# Patient Record
Sex: Female | Born: 1940 | Race: Black or African American | Hispanic: No | Smoking: Current every day smoker
Health system: Southern US, Community
[De-identification: ages and names within clinical notes are randomized; demographics above are authoritative.]

## PROBLEM LIST (undated history)

## (undated) DIAGNOSIS — M1990 Unspecified osteoarthritis, unspecified site: Secondary | ICD-10-CM

## (undated) DIAGNOSIS — R519 Headache, unspecified: Secondary | ICD-10-CM

## (undated) DIAGNOSIS — H269 Unspecified cataract: Secondary | ICD-10-CM

## (undated) DIAGNOSIS — Z860100 Personal history of colon polyps, unspecified: Secondary | ICD-10-CM

## (undated) DIAGNOSIS — F329 Major depressive disorder, single episode, unspecified: Secondary | ICD-10-CM

## (undated) DIAGNOSIS — E039 Hypothyroidism, unspecified: Secondary | ICD-10-CM

## (undated) DIAGNOSIS — Z973 Presence of spectacles and contact lenses: Secondary | ICD-10-CM

## (undated) DIAGNOSIS — Z95810 Presence of automatic (implantable) cardiac defibrillator: Secondary | ICD-10-CM

## (undated) DIAGNOSIS — E119 Type 2 diabetes mellitus without complications: Secondary | ICD-10-CM

## (undated) DIAGNOSIS — Z972 Presence of dental prosthetic device (complete) (partial): Secondary | ICD-10-CM

## (undated) DIAGNOSIS — K219 Gastro-esophageal reflux disease without esophagitis: Secondary | ICD-10-CM

## (undated) DIAGNOSIS — I5041 Acute combined systolic (congestive) and diastolic (congestive) heart failure: Secondary | ICD-10-CM

## (undated) DIAGNOSIS — T7840XA Allergy, unspecified, initial encounter: Secondary | ICD-10-CM

## (undated) DIAGNOSIS — E079 Disorder of thyroid, unspecified: Secondary | ICD-10-CM

## (undated) DIAGNOSIS — I10 Essential (primary) hypertension: Secondary | ICD-10-CM

## (undated) DIAGNOSIS — F32A Depression, unspecified: Secondary | ICD-10-CM

## (undated) DIAGNOSIS — C801 Malignant (primary) neoplasm, unspecified: Secondary | ICD-10-CM

## (undated) DIAGNOSIS — K5090 Crohn's disease, unspecified, without complications: Secondary | ICD-10-CM

## (undated) DIAGNOSIS — R569 Unspecified convulsions: Secondary | ICD-10-CM

## (undated) DIAGNOSIS — K589 Irritable bowel syndrome without diarrhea: Secondary | ICD-10-CM

## (undated) HISTORY — DX: Allergy, unspecified, initial encounter: T78.40XA

## (undated) HISTORY — PX: CATARACT EXTRACTION W/ INTRAOCULAR LENS IMPLANT: SHX1309

## (undated) HISTORY — DX: Disorder of thyroid, unspecified: E07.9

## (undated) HISTORY — DX: Type 2 diabetes mellitus without complications: E11.9

## (undated) HISTORY — PX: BREAST SURGERY: SHX581

## (undated) HISTORY — DX: Unspecified convulsions: R56.9

## (undated) HISTORY — PX: MASTECTOMY: SHX3

## (undated) HISTORY — PX: ABDOMINAL HYSTERECTOMY: SHX81

## (undated) HISTORY — PX: TONSILLECTOMY: SUR1361

## (undated) HISTORY — DX: Depression, unspecified: F32.A

## (undated) HISTORY — DX: Major depressive disorder, single episode, unspecified: F32.9

## (undated) HISTORY — DX: Essential (primary) hypertension: I10

---

## 2007-02-22 ENCOUNTER — Encounter: Admission: RE | Admit: 2007-02-22 | Discharge: 2007-02-22 | Payer: Self-pay | Admitting: Obstetrics and Gynecology

## 2009-05-28 ENCOUNTER — Emergency Department (HOSPITAL_BASED_OUTPATIENT_CLINIC_OR_DEPARTMENT_OTHER): Admission: EM | Admit: 2009-05-28 | Discharge: 2009-05-28 | Payer: Self-pay | Admitting: Emergency Medicine

## 2011-05-26 ENCOUNTER — Other Ambulatory Visit: Payer: Self-pay | Admitting: Obstetrics and Gynecology

## 2011-05-26 DIAGNOSIS — Z1231 Encounter for screening mammogram for malignant neoplasm of breast: Secondary | ICD-10-CM

## 2011-05-31 ENCOUNTER — Ambulatory Visit
Admission: RE | Admit: 2011-05-31 | Discharge: 2011-05-31 | Disposition: A | Discharge status: Home / Self Care | Payer: Medicare Other | Source: Ambulatory Visit | Attending: Obstetrics and Gynecology | Admitting: Obstetrics and Gynecology

## 2011-05-31 DIAGNOSIS — Z1231 Encounter for screening mammogram for malignant neoplasm of breast: Secondary | ICD-10-CM

## 2012-05-07 ENCOUNTER — Other Ambulatory Visit: Payer: Self-pay | Admitting: Obstetrics and Gynecology

## 2012-05-07 ENCOUNTER — Other Ambulatory Visit: Payer: Self-pay | Admitting: Family Medicine

## 2012-05-07 DIAGNOSIS — Z1231 Encounter for screening mammogram for malignant neoplasm of breast: Secondary | ICD-10-CM

## 2012-05-31 ENCOUNTER — Ambulatory Visit
Admission: RE | Admit: 2012-05-31 | Discharge: 2012-05-31 | Disposition: A | Discharge status: Home / Self Care | Payer: Medicare Other | Source: Ambulatory Visit | Attending: Family Medicine | Admitting: Family Medicine

## 2012-05-31 DIAGNOSIS — Z1231 Encounter for screening mammogram for malignant neoplasm of breast: Secondary | ICD-10-CM

## 2012-06-20 ENCOUNTER — Emergency Department: Payer: Self-pay | Admitting: Emergency Medicine

## 2012-06-20 LAB — CBC WITH DIFFERENTIAL/PLATELET
Basophil #: 0.1 10*3/uL (ref 0.0–0.1)
Basophil %: 1.1 %
Eosinophil #: 0 10*3/uL (ref 0.0–0.7)
Eosinophil %: 0.7 %
HCT: 40.1 % (ref 35.0–47.0)
HGB: 13.9 g/dL (ref 12.0–16.0)
Lymphocyte #: 2.1 10*3/uL (ref 1.0–3.6)
Lymphocyte %: 33.6 %
MCH: 32.4 pg (ref 26.0–34.0)
MCHC: 34.7 g/dL (ref 32.0–36.0)
MCV: 93 fL (ref 80–100)
Monocyte #: 0.6 x10 3/mm (ref 0.2–0.9)
Monocyte %: 9.1 %
Neutrophil #: 3.4 10*3/uL (ref 1.4–6.5)
Neutrophil %: 55.5 %
Platelet: 308 10*3/uL (ref 150–440)
RBC: 4.3 10*6/uL (ref 3.80–5.20)
RDW: 14.3 % (ref 11.5–14.5)
WBC: 6.1 10*3/uL (ref 3.6–11.0)

## 2012-06-20 LAB — URINALYSIS, COMPLETE
Bacteria: NONE SEEN
Bilirubin,UR: NEGATIVE
Blood: NEGATIVE
Glucose,UR: >=500 mg/dL (ref 0–75)
Ketone: NEGATIVE
Leukocyte Esterase: NEGATIVE
Nitrite: NEGATIVE
Ph: 5 (ref 4.5–8.0)
Protein: NEGATIVE
RBC,UR: 3 /HPF (ref 0–5)
Specific Gravity: 1.012 (ref 1.003–1.030)
Squamous Epithelial: NONE SEEN
WBC UR: 1 /HPF (ref 0–5)

## 2012-06-20 LAB — BASIC METABOLIC PANEL
Anion Gap: 7 (ref 7–16)
BUN: 12 mg/dL (ref 7–18)
Calcium, Total: 9 mg/dL (ref 8.5–10.1)
Chloride: 109 mmol/L — ABNORMAL HIGH (ref 98–107)
Co2: 25 mmol/L (ref 21–32)
Creatinine: 0.69 mg/dL (ref 0.60–1.30)
EGFR (African American): >60
EGFR (Non-African Amer.): >60
Glucose: 173 mg/dL — ABNORMAL HIGH (ref 65–99)
Osmolality: 285 (ref 275–301)
Potassium: 3.6 mmol/L (ref 3.5–5.1)
Sodium: 141 mmol/L (ref 136–145)

## 2013-05-26 ENCOUNTER — Other Ambulatory Visit: Payer: Self-pay

## 2013-05-26 DIAGNOSIS — Z1231 Encounter for screening mammogram for malignant neoplasm of breast: Secondary | ICD-10-CM

## 2013-06-17 ENCOUNTER — Ambulatory Visit
Admission: RE | Admit: 2013-06-17 | Discharge: 2013-06-17 | Disposition: A | Discharge status: Home / Self Care | Payer: Medicare Other | Source: Ambulatory Visit

## 2013-06-17 DIAGNOSIS — Z1231 Encounter for screening mammogram for malignant neoplasm of breast: Secondary | ICD-10-CM

## 2014-06-11 ENCOUNTER — Other Ambulatory Visit: Payer: Self-pay

## 2014-06-11 DIAGNOSIS — Z1231 Encounter for screening mammogram for malignant neoplasm of breast: Secondary | ICD-10-CM

## 2014-06-18 ENCOUNTER — Ambulatory Visit
Admission: RE | Admit: 2014-06-18 | Discharge: 2014-06-18 | Disposition: A | Discharge status: Home / Self Care | Payer: Commercial Managed Care - HMO | Source: Ambulatory Visit

## 2014-06-18 DIAGNOSIS — Z1231 Encounter for screening mammogram for malignant neoplasm of breast: Secondary | ICD-10-CM

## 2014-07-18 ENCOUNTER — Ambulatory Visit (INDEPENDENT_AMBULATORY_CARE_PROVIDER_SITE_OTHER): Payer: Medicare HMO

## 2014-07-18 ENCOUNTER — Ambulatory Visit (INDEPENDENT_AMBULATORY_CARE_PROVIDER_SITE_OTHER): Payer: Medicare HMO | Admitting: Emergency Medicine

## 2014-07-18 VITALS — BP 140/90 | HR 62 | Temp 97.4°F | Resp 16 | Ht 62.0 in | Wt 145.0 lb

## 2014-07-18 DIAGNOSIS — S92911S Unspecified fracture of right toe(s), sequela: Secondary | ICD-10-CM

## 2014-07-18 DIAGNOSIS — S8290XS Unspecified fracture of unspecified lower leg, sequela: Secondary | ICD-10-CM

## 2014-07-18 NOTE — Patient Instructions (Signed)

## 2014-07-18 NOTE — Progress Notes (Signed)
   Subjective:    Patient ID: Colleen Lowery, female    DOB: 1941/07/20, 73 y.o.   MRN: [id number]  This chart was scribed for Arlyss Queen, MD by Erling Conte, Medical Scribe. This patient was seen in Room 4 and the patient's care was started at 10:21 AM.  Chief Complaint  Patient presents with  . Toe Injury    second toe right foot      HPI HPI Comments: Colleen Lowery is a 73 y.o. female who presents to the [facility] complaining of moderate, constant pain in her second toe of her right foot. She states she injured her toe about 2 weeks ago but at the time she just believed that she "stubbed" it.  She admits that she continued to have pain and swelling to the area. She went to go see her PCP and he sent her to have imaging at [facility]. She was told she has an articulate fracture in the toe. She states that the pain is exacerbated by ambulating and applied pressure. She admits that taping up the toe has provided relief for the pain when she applies pressure. She denies any weakness, numbness, or color change to the area.    Review of Systems  Musculoskeletal: Positive for arthralgias (right second toe pain), gait problem and joint swelling (right second toe).  Skin: Negative for color change.  Neurological: Negative for weakness and numbness.       Objective:   Physical Exam CONSTITUTIONAL: Well developed/well nourished HEAD: Normocephalic/atraumatic EYES: EOMI/PERRL ENMT: Mucous membranes moist NECK: supple no meningeal signs SPINE:entire spine nontender CV: S1/S2 noted, no murmurs/rubs/gallops noted LUNGS: Lungs are clear to auscultation bilaterally, no apparent distress ABDOMEN: soft, nontender, no rebound or guarding GU:no cva tenderness NEURO: Pt is awake/alert, moves all extremitiesx4 EXTREMITIES: pulses normal, full ROM. Swelling of the right 2nd toe with minimal angulation.  SKIN: warm, color normal PSYCH: no abnormalities of mood  noted  [facility] reading (PRIMARY) by  Dr. Everlene Farrier there is a fracture of the proximal phalanx second toe which is intra-articular with mild medial deviation.      Assessment & Plan:  Patient here with intra-articular fracture at the PIP joint second toe. Buddy taping performed she was given a postop shoe rex-ray 2 weeks .

## 2014-08-02 ENCOUNTER — Ambulatory Visit (INDEPENDENT_AMBULATORY_CARE_PROVIDER_SITE_OTHER): Payer: Medicare HMO

## 2014-08-02 ENCOUNTER — Ambulatory Visit (INDEPENDENT_AMBULATORY_CARE_PROVIDER_SITE_OTHER): Payer: Commercial Managed Care - HMO | Admitting: Emergency Medicine

## 2014-08-02 VITALS — BP 182/96 | HR 55 | Temp 97.7°F | Resp 16 | Ht 62.5 in | Wt 149.5 lb

## 2014-08-02 DIAGNOSIS — M79609 Pain in unspecified limb: Secondary | ICD-10-CM

## 2014-08-02 DIAGNOSIS — M79674 Pain in right toe(s): Secondary | ICD-10-CM

## 2014-08-02 DIAGNOSIS — S8290XS Unspecified fracture of unspecified lower leg, sequela: Secondary | ICD-10-CM

## 2014-08-02 DIAGNOSIS — E119 Type 2 diabetes mellitus without complications: Secondary | ICD-10-CM | POA: Insufficient documentation

## 2014-08-02 DIAGNOSIS — I10 Essential (primary) hypertension: Secondary | ICD-10-CM | POA: Insufficient documentation

## 2014-08-02 DIAGNOSIS — S92911S Unspecified fracture of right toe(s), sequela: Secondary | ICD-10-CM

## 2014-08-02 NOTE — Progress Notes (Signed)
Subjective:    Patient ID: Colleen Lowery, female    DOB: 05-Jul-1941, 73 y.o.   MRN: [id number] This chart was scribed for [place]. Everlene Farrier, MD by Steva Colder, ED Scribe. The patient was seen in room 8 at 4:31 PM.   Chief Complaint  Patient presents with  . Follow-up    X-ray foot    HPI Colleen Lowery is a 73 y.o. female with a medical hx of DM and HTN who presents today complaining of a F/U on a X-ray of her right foot. She states that she was here for a fracture to her right toes. She states that she has been buddy taping it since the injury. She states that she thinks that she injured her foot on her bed while walking to the bathroom at night. She states that she is taking her blood pressure medications.    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 She states that no one aids in her reacting chemicals. She states that she ran into a brick wall at [place], after she cleaned mold in her car and she didn't ventilate the car and she passed out. She states that she hasn't felt right since she gave blood 2 years ago. She voices concerns for getting a bag of fluids to aid in the balance. she states that she is not able to get a specialist to help her. She denies any other associated symptoms. 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 She states that her Neurologist is Dr. Baltazar Najjar at [facility] in [facility]. 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      There are no active problems to display for this patient.  Past Medical History  Diagnosis Date  . Allergy   . Depression   . Diabetes mellitus without complication   . Hypertension   . Seizures   . Thyroid disease    Past Surgical History  Procedure Laterality Date  . Abdominal hysterectomy     Allergies  Allergen Reactions  . Dust Mite Extract Swelling  . Molds & Smuts Swelling   Prior to Admission medications   Medication Sig Start  Date End Date Taking? Authorizing Provider  levETIRAcetam (KEPPRA) 250 MG tablet Take 250 mg by mouth 2 (two) times daily.   Yes Historical Provider, MD  Liothyronine Sodium (CYTOMEL PO) Take 12.5 mg by mouth daily.   Yes Historical Provider, MD  losartan (COZAAR) 100 MG tablet Take 100 mg by mouth daily.   Yes Historical Provider, MD  metFORMIN (GLUCOPHAGE) 1000 MG tablet Take 1,000 mg by mouth 2 (two) times daily with a meal.   Yes Historical Provider, MD  NON FORMULARY Sublingual Antigen-inhalents-1 pump under tongue three times daily   Yes Historical Provider, MD  Nutritional Supplements (DHEA PO) Take 1.5 mg by mouth daily.   Yes Historical Provider, MD  Progesterone Micronized (PROGESTERONE PO) Take 12.5 mg by mouth daily.   Yes Historical Provider, MD      Review of Systems     Objective:   Physical Exam  Nursing note and vitals reviewed. Constitutional: She is oriented to person, place, and time. She appears well-developed and well-nourished. No distress.  HENT:  Head: Normocephalic and atraumatic.  Eyes: EOM are normal.  Neck: Neck supple.  Cardiovascular: Normal rate.   Pulmonary/Chest: Effort normal. No respiratory distress.  Musculoskeletal: Normal range of motion.  Neurological: She is alert and oriented to person, place, and time.  Skin: Skin is warm and dry.  Psychiatric: She has a normal mood and affect. Her behavior is normal.   Mild swelling of the right second toe. There is a normal appearance of alignment.  [facility] reading (PRIMARY) by  Dr Everlene Farrier there are healing fractures of the middle and proximal phalanx of the right second toe      BP 198/88  Pulse 55  Temp(Src) 97.7 F (36.5 C) (Oral)  Resp 16  Ht 5' 2.5" (1.588 m)  Wt 149 lb 8 oz (67.813 kg)  BMI 26.89 kg/m2  SpO2 100%   Assessment & Plan:  I personally performed the services described in this documentation, which was scribed in my presence. The recorded information has been reviewed and is  accurate.  Pt advised to F/u with her PCP regarding her elevated blood pressure. She will keep her toes buddy-taped for the next 3-4 weeks.

## 2014-08-03 ENCOUNTER — Telehealth: Payer: Self-pay

## 2014-08-03 NOTE — Telephone Encounter (Signed)
Dr. Everlene Farrier wanted me to call pt and make sure she follows up in 3-4 weeks for a repeat x-ray. LMOM of this

## 2014-09-01 ENCOUNTER — Ambulatory Visit (INDEPENDENT_AMBULATORY_CARE_PROVIDER_SITE_OTHER): Payer: Medicare HMO | Admitting: Internal Medicine

## 2014-09-01 ENCOUNTER — Ambulatory Visit (INDEPENDENT_AMBULATORY_CARE_PROVIDER_SITE_OTHER): Payer: Medicare HMO

## 2014-09-01 VITALS — BP 192/60 | HR 83 | Temp 98.2°F | Resp 18 | Ht 62.0 in | Wt 151.0 lb

## 2014-09-01 DIAGNOSIS — S92911S Unspecified fracture of right toe(s), sequela: Secondary | ICD-10-CM

## 2014-09-01 NOTE — Progress Notes (Signed)
   Subjective:    Patient ID: Colleen Lowery, female    DOB: 09-19-1941, 73 y.o.   MRN: [id number]  HPI she stubbed the toes of her right foot late in August and subsequently discovered that they were broken with nondisplaced fractures of the PIP 2 and 3 She has been treated with buddy taping and a stiff shoe and has done well She currently is pain free with ambulation she says Her swelling has resolved  Current medical problems include diabetes and hypertension   Review of Systems Noncontributory    Objective:   Physical Exam BP 192/60  Pulse 83  Temp(Src) 98.2 F (36.8 C) (Oral)  Resp 18  Ht 5\' 2"  (1.575 m)  Wt 151 lb (68.493 kg)  BMI 27.61 kg/m2  SpO2 100% No acute distress there is no swelling of the right foot She is nontender with palpation over the PIP joints or the MTP joints of the right second and third toes and has a fairly good range of motion although the right second PIP is stiff Gait is non-antalgic Sensation intact/vascular intact/onychomycosis present  [facility] reading (PRIMARY) by  Dr. Laney Pastor  fracture appearance is the same in both locations without evidence of new bone.       Assessment & Plan:  Fractures PIP joints 2 and 3 on the right foot  Stable by exam- to continue to progress activities as tolerated using buddy taping and postop shoe only as needed to control pain//if not pain-free re-x-ray in late November  She is referred back to her primary care provider for further control of her hypertension

## 2014-11-03 ENCOUNTER — Ambulatory Visit (INDEPENDENT_AMBULATORY_CARE_PROVIDER_SITE_OTHER): Payer: Medicare HMO | Admitting: Family Medicine

## 2014-11-03 ENCOUNTER — Ambulatory Visit (INDEPENDENT_AMBULATORY_CARE_PROVIDER_SITE_OTHER): Payer: Medicare HMO

## 2014-11-03 VITALS — BP 162/100 | HR 88 | Temp 98.1°F | Resp 16 | Ht 62.25 in | Wt 153.8 lb

## 2014-11-03 DIAGNOSIS — S92591S Other fracture of right lesser toe(s), sequela: Secondary | ICD-10-CM

## 2014-11-03 DIAGNOSIS — S92501S Displaced unspecified fracture of right lesser toe(s), sequela: Secondary | ICD-10-CM

## 2014-11-03 NOTE — Progress Notes (Signed)
[facility] [address], [place] [address] [phone]  Date:  11/03/2014   Name:  Colleen Lowery   DOB:  03/18/1941   MRN:  [id number]  PCP:  No PCP Per Patient    Chief Complaint: Follow-up and Toe Injury   History of Present Illness:  Colleen Lowery is a 73 y.o. very pleasant female patient who presents with the following:  Here today to follow-up a right toe fracture that occurred in August-  first seen for this 9/5: IMPRESSION: 1. Minimally displaced subacute fracture involving the distal epiphysis of the proximal phalanx of the second digit with extension to the PIP joint. 2. Minimally displaced subacute fracture involving the mid diaphysis of the middle phalanx of the second digit without definite intra-articular extension.  Most recent x-ray from 2 months ago showed healing She would like to have it rechecked as it continues to bother her some.  She feels "something down here" under the toe.  Her pain is now more under the foot, it can feel sensitive when she walks   Patient Active Problem List   Diagnosis Date Noted  . Diabetes 08/02/2014  . Unspecified essential hypertension 08/02/2014    Past Medical History  Diagnosis Date  . Allergy   . Depression   . Diabetes mellitus without complication   . Hypertension   . Seizures   . Thyroid disease     Past Surgical History  Procedure Laterality Date  . Abdominal hysterectomy      History  Substance Use Topics  . Smoking status: Current Every Day Smoker  . Smokeless tobacco: Never Used  . Alcohol Use: No    Family History  Problem Relation Age of Onset  . Diabetes Mother   . Stroke Mother   . Diabetes Brother   . Diabetes Maternal Grandmother   . Diabetes Paternal Grandfather   . Hypertension Brother     Allergies  Allergen Reactions  . Dust Mite Extract Swelling  . Molds & Smuts Swelling    Medication list has been reviewed and updated.  Current Outpatient  Prescriptions on File Prior to Visit  Medication Sig Dispense Refill  . levETIRAcetam (KEPPRA) 250 MG tablet Take 250 mg by mouth 2 (two) times daily.    . Liothyronine Sodium (CYTOMEL PO) Take 12.5 mg by mouth daily.    [name] losartan (COZAAR) 100 MG tablet Take 100 mg by mouth daily.    . metFORMIN (GLUCOPHAGE) 1000 MG tablet Take 1,000 mg by mouth 2 (two) times daily with a meal.    . NON FORMULARY Sublingual Antigen-inhalents-1 pump under tongue three times daily    . Nutritional Supplements (DHEA PO) Take 1.5 mg by mouth daily.    . Progesterone Micronized (PROGESTERONE PO) Take 12.5 mg by mouth daily.     No current facility-administered medications on file prior to visit.    Review of Systems:  As per HPI- otherwise negative.  BP Readings from Last 3 Encounters:  11/03/14 162/100  09/01/14 192/60  08/02/14 182/96    Physical Examination: Filed Vitals:   11/03/14 1331  BP: 162/100  Pulse: 88  Temp: 98.1 F (36.7 C)  Resp: 16   Filed Vitals:   11/03/14 1331  Height: 5' 2.25" (1.581 m)  Weight: 153 lb 12.8 oz (69.763 kg)   Body mass index is 27.91 kg/(m^2). Ideal Body Weight: Weight in (lb) to have BMI = 25: 137.5   GEN: WDWN, NAD, Non-toxic, Alert &  Oriented x 3 HEENT: Atraumatic, Normocephalic.  Ears and Nose: No external deformity. EXTR: No clubbing/cyanosis/edema NEURO: Normal gait.  PSYCH: Normally interactive. Conversant. Not depressed or anxious appearing.  Calm demeanor.  Right foot:mild tenderness under the distal 2nd MT head.  She has fat atrophy under the foot; this is likely why she notes the tenderness The toe is non- swollen, no bruise, normal flexion.  Appears well- healed  [facility] reading (PRIMARY) by  Dr. Lorelei Pont. Right foot: healed fracture of the 2nd toe  RIGHT FOOT COMPLETE - 3+ VIEW  COMPARISON: 09/01/2014  FINDINGS: Three views of the right foot submitted. There is healed fracture distal aspect of proximal phalanx second toe. No new  fracture or subluxation. Plantar spur of calcaneus.  IMPRESSION: Healed fracture of distal aspect proximal phalanx second toe. No new fracture or subluxation. Plantar spurring of calcaneus.  Assessment and Plan: Fracture of second toe, right, closed, sequela - Plan: DG Foot Complete Right  Counseled her that her toe appears to be completely healed.  She can return to her regular activities.  Suggested that she try some padded insoles as I suspect her pain now is due to lack of padding under her foot from fat atrophy  Signed Lamar Blinks, MD

## 2014-11-03 NOTE — Patient Instructions (Signed)
Your fractures appear to have healed well, let us know if you have any other concerns

## 2015-07-02 ENCOUNTER — Other Ambulatory Visit: Payer: Self-pay

## 2015-07-02 DIAGNOSIS — Z1231 Encounter for screening mammogram for malignant neoplasm of breast: Secondary | ICD-10-CM

## 2015-07-06 ENCOUNTER — Ambulatory Visit
Admission: RE | Admit: 2015-07-06 | Discharge: 2015-07-06 | Disposition: A | Discharge status: Home / Self Care | Payer: Commercial Managed Care - HMO | Source: Ambulatory Visit

## 2015-07-06 DIAGNOSIS — Z1231 Encounter for screening mammogram for malignant neoplasm of breast: Secondary | ICD-10-CM

## 2015-07-23 ENCOUNTER — Emergency Department (HOSPITAL_COMMUNITY)
Admission: EM | Admit: 2015-07-23 | Discharge: 2015-07-23 | Disposition: A | Discharge status: Home / Self Care | Payer: Medicare HMO | Source: Home / Self Care | Attending: Family Medicine | Admitting: Family Medicine

## 2015-07-23 ENCOUNTER — Encounter (HOSPITAL_COMMUNITY): Payer: Self-pay | Admitting: Emergency Medicine

## 2015-07-23 DIAGNOSIS — R21 Rash and other nonspecific skin eruption: Secondary | ICD-10-CM | POA: Diagnosis not present

## 2015-07-23 DIAGNOSIS — L739 Follicular disorder, unspecified: Secondary | ICD-10-CM

## 2015-07-23 DIAGNOSIS — I10 Essential (primary) hypertension: Secondary | ICD-10-CM | POA: Diagnosis not present

## 2015-07-23 MED ORDER — CEPHALEXIN 500 MG PO CAPS: 500.0000 mg | ORAL_CAPSULE | Freq: Three times a day (TID) | ORAL | Status: DC

## 2015-07-23 MED ORDER — FLUCONAZOLE 150 MG PO TABS
150.0000 mg | ORAL_TABLET | Freq: Every day | ORAL | Status: DC
Start: 2015-07-23 — End: 2016-02-12

## 2015-07-23 NOTE — Discharge Instructions (Signed)
The cause of your rashes likely from a mild bacterial overgrowth infection called folliculitis. Please start the Keflex. Please consider using the Diflucan for additional yeast protection. Please also start using a mild over-the-counter steroid cream such as hydrocortisone.

## 2015-07-23 NOTE — ED Notes (Signed)
Pt c/o rash on abd (below breast) and on back onset 2-3 weeks Also c/o left eye itchiness x2-3 weeks Alert and oriented x4... No acute distress.

## 2015-07-23 NOTE — ED Provider Notes (Signed)
CSN: [id number]     Arrival date & time 07/23/15  1417 History   First MD Initiated Contact with Patient 07/23/15 1453     Chief Complaint  Patient presents with  . Rash  . Eye Problem   (Consider location/radiation/quality/duration/timing/severity/associated sxs/prior Treatment) HPI  Rash: started 2-3 wks ago. Comes and goes. Itchy. Back, chest. Has not tried anything for the rash. Denies fevers, nausea, vomiting, chest pain, display weight loss, joint effusions.   Eyelid. R itchy. Comes and goes. No vision change.      Past Medical History  Diagnosis Date  . Allergy   . Depression   . Diabetes mellitus without complication   . Hypertension   . Seizures   . Thyroid disease    Past Surgical History  Procedure Laterality Date  . Abdominal hysterectomy     Family History  Problem Relation Age of Onset  . Diabetes Mother   . Stroke Mother   . Diabetes Brother   . Diabetes Maternal Grandmother   . Diabetes Paternal Grandfather   . Hypertension Brother    Social History  Substance Use Topics  . Smoking status: Current Every Day Smoker  . Smokeless tobacco: Never Used  . Alcohol Use: No   OB History    No data available     Review of Systems Per HPI with all other pertinent systems negative.   Allergies  Dust mite extract and Molds & smuts  Home Medications   Prior to Admission medications   Medication Sig Start Date End Date Taking? Authorizing Provider  levETIRAcetam (KEPPRA) 250 MG tablet Take 250 mg by mouth 2 (two) times daily.   Yes Historical Provider, MD  Liothyronine Sodium (CYTOMEL PO) Take 12.5 mg by mouth daily.   Yes Historical Provider, MD  losartan (COZAAR) 100 MG tablet Take 100 mg by mouth daily.   Yes Historical Provider, MD  metFORMIN (GLUCOPHAGE) 1000 MG tablet Take 1,000 mg by mouth 2 (two) times daily with a meal.   Yes Historical Provider, MD  NON FORMULARY Sublingual Antigen-inhalents-1 pump under tongue three times daily   Yes  Historical Provider, MD  Nutritional Supplements (DHEA PO) Take 1.5 mg by mouth daily.   Yes Historical Provider, MD  Progesterone Micronized (PROGESTERONE PO) Take 12.5 mg by mouth daily.   Yes Historical Provider, MD  cephALEXin (KEFLEX) 500 MG capsule Take 1 capsule (500 mg total) by mouth 3 (three) times daily. 07/23/15   Waldemar Dickens, MD  fluconazole (DIFLUCAN) 150 MG tablet Take 1 tablet (150 mg total) by mouth daily. Repeat dose in 3 days 07/23/15   Waldemar Dickens, MD   Meds Ordered and Administered this Visit  Medications - No data to display  BP 183/104 mmHg  Pulse 101  Temp(Src) 98.3 F (36.8 C) (Oral)  Resp 16  SpO2 98% No data found.   Physical Exam /ucpe Physical Exam  Constitutional: oriented to person, place, and time. appears well-developed and well-nourished. No distress.  HENT:  Head: Normocephalic and atraumatic.  Eyes: EOMI. PERRL.  Neck: Normal range of motion.  Cardiovascular: RRR, no m/r/g, 2+ distal pulses,  Pulmonary/Chest: Effort normal and breath sounds normal. No respiratory distress.  Abdominal: Soft. Bowel sounds are normal. NonTTP, no distension.  Musculoskeletal: Normal range of motion. Non ttp, no effusion.  Neurological: alert and oriented to person, place, and time.  Skin: Mild follicular rash throughout upper back and upper chest. Several with pustular minerals.  Psychiatric: normal mood and affect. behavior is  normal. Judgment and thought content normal.   ED Course  Procedures (including critical care time)  Labs Review Labs Reviewed - No data to display  Imaging Review No results found.   Visual Acuity Review  Right Eye Distance:   Left Eye Distance:   Bilateral Distance:    Right Eye Near:   Left Eye Near:    Bilateral Near:         MDM   1. Folliculitis   2. Rash   3. Essential hypertension    Elevation in blood pressure noted. Patient does not take her blood pressure medication this morning.  Keflex for  folliculitis. Diflucan if develops yeast infection. Patient also to use hydrocortisone  Szr: no szr since 2014  Waldemar Dickens, MD 07/23/15 1525

## 2016-02-07 ENCOUNTER — Emergency Department (HOSPITAL_COMMUNITY): Payer: Medicare HMO

## 2016-02-07 ENCOUNTER — Encounter (HOSPITAL_COMMUNITY): Payer: Self-pay | Admitting: *Deleted

## 2016-02-07 ENCOUNTER — Inpatient Hospital Stay (HOSPITAL_COMMUNITY): Payer: Medicare HMO

## 2016-02-07 ENCOUNTER — Inpatient Hospital Stay (HOSPITAL_COMMUNITY)
Admission: EM | Admit: 2016-02-07 | Discharge: 2016-02-12 | DRG: 224 | Disposition: A | Discharge status: Home / Self Care | Payer: Medicare HMO | Attending: Interventional Cardiology | Admitting: Interventional Cardiology

## 2016-02-07 DIAGNOSIS — I2582 Chronic total occlusion of coronary artery: Secondary | ICD-10-CM | POA: Diagnosis present

## 2016-02-07 DIAGNOSIS — R0600 Dyspnea, unspecified: Secondary | ICD-10-CM | POA: Insufficient documentation

## 2016-02-07 DIAGNOSIS — I272 Other secondary pulmonary hypertension: Secondary | ICD-10-CM | POA: Diagnosis not present

## 2016-02-07 DIAGNOSIS — I2510 Atherosclerotic heart disease of native coronary artery without angina pectoris: Secondary | ICD-10-CM | POA: Diagnosis present

## 2016-02-07 DIAGNOSIS — F17200 Nicotine dependence, unspecified, uncomplicated: Secondary | ICD-10-CM | POA: Diagnosis present

## 2016-02-07 DIAGNOSIS — R079 Chest pain, unspecified: Secondary | ICD-10-CM | POA: Diagnosis not present

## 2016-02-07 DIAGNOSIS — I110 Hypertensive heart disease with heart failure: Secondary | ICD-10-CM | POA: Diagnosis present

## 2016-02-07 DIAGNOSIS — I472 Ventricular tachycardia, unspecified: Secondary | ICD-10-CM

## 2016-02-07 DIAGNOSIS — I255 Ischemic cardiomyopathy: Secondary | ICD-10-CM | POA: Diagnosis present

## 2016-02-07 DIAGNOSIS — I5043 Acute on chronic combined systolic (congestive) and diastolic (congestive) heart failure: Secondary | ICD-10-CM | POA: Diagnosis not present

## 2016-02-07 DIAGNOSIS — I471 Supraventricular tachycardia, unspecified: Secondary | ICD-10-CM

## 2016-02-07 DIAGNOSIS — R0602 Shortness of breath: Secondary | ICD-10-CM

## 2016-02-07 DIAGNOSIS — F329 Major depressive disorder, single episode, unspecified: Secondary | ICD-10-CM | POA: Diagnosis present

## 2016-02-07 DIAGNOSIS — I5031 Acute diastolic (congestive) heart failure: Secondary | ICD-10-CM

## 2016-02-07 DIAGNOSIS — Z79899 Other long term (current) drug therapy: Secondary | ICD-10-CM | POA: Diagnosis not present

## 2016-02-07 DIAGNOSIS — Z91041 Radiographic dye allergy status: Secondary | ICD-10-CM | POA: Diagnosis not present

## 2016-02-07 DIAGNOSIS — E119 Type 2 diabetes mellitus without complications: Secondary | ICD-10-CM | POA: Diagnosis present

## 2016-02-07 DIAGNOSIS — Z95818 Presence of other cardiac implants and grafts: Secondary | ICD-10-CM

## 2016-02-07 DIAGNOSIS — I10 Essential (primary) hypertension: Secondary | ICD-10-CM | POA: Diagnosis present

## 2016-02-07 DIAGNOSIS — I4510 Unspecified right bundle-branch block: Secondary | ICD-10-CM | POA: Diagnosis present

## 2016-02-07 DIAGNOSIS — Z720 Tobacco use: Secondary | ICD-10-CM

## 2016-02-07 DIAGNOSIS — I509 Heart failure, unspecified: Secondary | ICD-10-CM

## 2016-02-07 DIAGNOSIS — F17210 Nicotine dependence, cigarettes, uncomplicated: Secondary | ICD-10-CM | POA: Diagnosis present

## 2016-02-07 LAB — BASIC METABOLIC PANEL
Anion gap: 13 (ref 5–15)
BUN: 9 mg/dL (ref 6–20)
CO2: 14 mmol/L — ABNORMAL LOW (ref 22–32)
Calcium: 9.4 mg/dL (ref 8.9–10.3)
Chloride: 106 mmol/L (ref 101–111)
Creatinine, Ser: 0.76 mg/dL (ref 0.44–1.00)
GFR calc Af Amer: >60 mL/min (ref >60)
GFR calc non Af Amer: >60 mL/min (ref >60)
Glucose, Bld: 254 mg/dL — ABNORMAL HIGH (ref 65–99)
Potassium: 3.8 mmol/L (ref 3.5–5.1)
Sodium: 133 mmol/L — ABNORMAL LOW (ref 135–145)

## 2016-02-07 LAB — I-STAT CHEM 8, ED
BUN: 7 mg/dL (ref 6–20)
Calcium, Ion: 1.2 mmol/L (ref 1.13–1.30)
Chloride: 105 mmol/L (ref 101–111)
Creatinine, Ser: 0.6 mg/dL (ref 0.44–1.00)
Glucose, Bld: 246 mg/dL — ABNORMAL HIGH (ref 65–99)
HCT: 43 % (ref 36.0–46.0)
Hemoglobin: 14.6 g/dL (ref 12.0–15.0)
Potassium: 3.7 mmol/L (ref 3.5–5.1)
Sodium: 136 mmol/L (ref 135–145)
TCO2: 15 mmol/L (ref 0–100)

## 2016-02-07 LAB — CBC
HCT: 38.3 % (ref 36.0–46.0)
Hemoglobin: 13.3 g/dL (ref 12.0–15.0)
MCH: 29.4 pg (ref 26.0–34.0)
MCHC: 34.7 g/dL (ref 30.0–36.0)
MCV: 84.7 fL (ref 78.0–100.0)
Platelets: 355 K/uL (ref 150–400)
RBC: 4.52 MIL/uL (ref 3.87–5.11)
RDW: 15 % (ref 11.5–15.5)
WBC: 6.6 K/uL (ref 4.0–10.5)

## 2016-02-07 LAB — MAGNESIUM
Magnesium: 1.9 mg/dL (ref 1.7–2.4)
Magnesium: 1.9 mg/dL (ref 1.7–2.4)

## 2016-02-07 LAB — I-STAT TROPONIN, ED: Troponin i, poc: 0.06 ng/mL (ref 0.00–0.08)

## 2016-02-07 LAB — D-DIMER, QUANTITATIVE: D-Dimer, Quant: 3.41 ug{FEU}/mL — ABNORMAL HIGH (ref 0.00–0.50)

## 2016-02-07 LAB — TROPONIN I: Troponin I: 0.07 ng/mL — ABNORMAL HIGH (ref <0.031)

## 2016-02-07 LAB — TSH
TSH: 0.261 u[IU]/mL — ABNORMAL LOW (ref 0.350–4.500)
TSH: 0.184 u[IU]/mL — ABNORMAL LOW (ref 0.350–4.500)

## 2016-02-07 LAB — GLUCOSE, CAPILLARY: Glucose-Capillary: 188 mg/dL — ABNORMAL HIGH (ref 65–99)

## 2016-02-07 LAB — PROTIME-INR
INR: 1.27 (ref 0.00–1.49)
Prothrombin Time: 15.6 seconds — ABNORMAL HIGH (ref 11.6–15.2)

## 2016-02-07 LAB — T4, FREE: Free T4: 1.13 ng/dL — ABNORMAL HIGH (ref 0.61–1.12)

## 2016-02-07 LAB — BRAIN NATRIURETIC PEPTIDE: B Natriuretic Peptide: 1646.8 pg/mL — ABNORMAL HIGH (ref 0.0–100.0)

## 2016-02-07 MED ORDER — ONDANSETRON HCL 4 MG/2ML IJ SOLN: 4.0000 mg | Freq: Four times a day (QID) | INTRAMUSCULAR | Status: DC | PRN

## 2016-02-07 MED ORDER — ADENOSINE 6 MG/2ML IV SOLN
6.0000 mg | Freq: Once | INTRAVENOUS | Status: AC
  Administered 2016-02-07: 6 mg via INTRAVENOUS
  Filled 2016-02-07: qty 2

## 2016-02-07 MED ORDER — ASPIRIN EC 81 MG PO TBEC
81.0000 mg | DELAYED_RELEASE_TABLET | Freq: Every day | ORAL | Status: DC
  Administered 2016-02-08 – 2016-02-12 (×4): 81 mg via ORAL
  Filled 2016-02-07 (×4): qty 1

## 2016-02-07 MED ORDER — ATORVASTATIN CALCIUM 20 MG PO TABS
20.0000 mg | ORAL_TABLET | Freq: Every day | ORAL | Status: DC
  Administered 2016-02-07 – 2016-02-11 (×5): 20 mg via ORAL
  Filled 2016-02-07 (×6): qty 1

## 2016-02-07 MED ORDER — DILTIAZEM HCL 25 MG/5ML IV SOLN
15.0000 mg | Freq: Once | INTRAVENOUS | Status: AC
  Administered 2016-02-07: 15 mg via INTRAVENOUS
  Filled 2016-02-07: qty 5

## 2016-02-07 MED ORDER — METOPROLOL TARTRATE 25 MG PO TABS
25.0000 mg | ORAL_TABLET | Freq: Two times a day (BID) | ORAL | Status: DC
  Administered 2016-02-07: 25 mg via ORAL
  Filled 2016-02-07: qty 1

## 2016-02-07 MED ORDER — AMIODARONE IV BOLUS ONLY 150 MG/100ML
150.0000 mg | Freq: Once | INTRAVENOUS | Status: AC
  Administered 2016-02-07: 150 mg via INTRAVENOUS

## 2016-02-07 MED ORDER — ALPRAZOLAM 0.25 MG PO TABS
0.2500 mg | ORAL_TABLET | Freq: Two times a day (BID) | ORAL | Status: DC | PRN
  Administered 2016-02-07 – 2016-02-12 (×5): 0.25 mg via ORAL
  Filled 2016-02-07 (×5): qty 1

## 2016-02-07 MED ORDER — SODIUM CHLORIDE 0.9 % IV SOLN
INTRAVENOUS | Status: DC
  Administered 2016-02-07: 17:00:00 via INTRAVENOUS

## 2016-02-07 MED ORDER — ENOXAPARIN SODIUM 80 MG/0.8ML ~~LOC~~ SOLN
70.0000 mg | Freq: Two times a day (BID) | SUBCUTANEOUS | Status: DC
  Administered 2016-02-08: 70 mg via SUBCUTANEOUS
  Filled 2016-02-07 (×2): qty 0.8

## 2016-02-07 MED ORDER — ADENOSINE 6 MG/2ML IV SOLN
INTRAVENOUS | Status: AC
  Filled 2016-02-07: qty 8

## 2016-02-07 MED ORDER — ENOXAPARIN SODIUM 100 MG/ML ~~LOC~~ SOLN
85.0000 mg | Freq: Once | SUBCUTANEOUS | Status: AC
  Administered 2016-02-07: 85 mg via SUBCUTANEOUS
  Filled 2016-02-07: qty 1

## 2016-02-07 MED ORDER — FUROSEMIDE 10 MG/ML IJ SOLN
40.0000 mg | Freq: Once | INTRAMUSCULAR | Status: AC
  Administered 2016-02-07: 40 mg via INTRAVENOUS
  Filled 2016-02-07: qty 4

## 2016-02-07 MED ORDER — ASPIRIN 81 MG PO CHEW
324.0000 mg | CHEWABLE_TABLET | ORAL | Status: AC
  Administered 2016-02-07: 324 mg via ORAL
  Filled 2016-02-07: qty 4

## 2016-02-07 MED ORDER — ASPIRIN 300 MG RE SUPP: 300.0000 mg | RECTAL | Status: AC

## 2016-02-07 MED ORDER — AMIODARONE LOAD VIA INFUSION
150.0000 mg | Freq: Once | INTRAVENOUS | Status: AC
  Administered 2016-02-07: 150 mg via INTRAVENOUS
  Filled 2016-02-07 (×2): qty 83.34

## 2016-02-07 MED ORDER — ADENOSINE 6 MG/2ML IV SOLN
12.0000 mg | Freq: Once | INTRAVENOUS | Status: AC
  Administered 2016-02-07: 12 mg via INTRAVENOUS

## 2016-02-07 MED ORDER — LABETALOL HCL 5 MG/ML IV SOLN
20.0000 mg | Freq: Once | INTRAVENOUS | Status: AC
  Administered 2016-02-07: 20 mg via INTRAVENOUS
  Filled 2016-02-07: qty 4

## 2016-02-07 MED ORDER — FENTANYL CITRATE (PF) 100 MCG/2ML IJ SOLN
INTRAMUSCULAR | Status: AC
  Filled 2016-02-07: qty 2

## 2016-02-07 MED ORDER — FLUTICASONE FUROATE 27.5 MCG/SPRAY NA SUSP: 2.0000 | Freq: Every day | NASAL | Status: DC | PRN

## 2016-02-07 MED ORDER — LISINOPRIL 10 MG PO TABS
20.0000 mg | ORAL_TABLET | Freq: Every day | ORAL | Status: DC
  Administered 2016-02-09: 20 mg via ORAL
  Filled 2016-02-07 (×3): qty 2
  Filled 2016-02-07: qty 1

## 2016-02-07 MED ORDER — ETOMIDATE 2 MG/ML IV SOLN: 0.3000 mg/kg | Freq: Once | INTRAVENOUS | Status: DC

## 2016-02-07 MED ORDER — ETOMIDATE 2 MG/ML IV SOLN
7.0000 mg | Freq: Once | INTRAVENOUS | Status: AC
  Administered 2016-02-07: 7 mg via INTRAVENOUS

## 2016-02-07 MED ORDER — ACETAMINOPHEN 325 MG PO TABS: 650.0000 mg | ORAL_TABLET | ORAL | Status: DC | PRN

## 2016-02-07 MED ORDER — AMIODARONE HCL IN DEXTROSE 360-4.14 MG/200ML-% IV SOLN
60.0000 mg/h | INTRAVENOUS | Status: DC
  Administered 2016-02-07: 60 mg/h via INTRAVENOUS
  Filled 2016-02-07 (×2): qty 200

## 2016-02-07 MED ORDER — NITROGLYCERIN 0.4 MG SL SUBL: 0.4000 mg | SUBLINGUAL_TABLET | SUBLINGUAL | Status: DC | PRN

## 2016-02-07 MED ORDER — AMIODARONE IV BOLUS ONLY 150 MG/100ML
INTRAVENOUS | Status: AC
  Filled 2016-02-07: qty 100

## 2016-02-07 MED ORDER — FLUTICASONE PROPIONATE 50 MCG/ACT NA SUSP
2.0000 | Freq: Every day | NASAL | Status: DC | PRN
  Administered 2016-02-10: 2 via NASAL
  Filled 2016-02-07: qty 16

## 2016-02-07 MED ORDER — FENTANYL CITRATE (PF) 100 MCG/2ML IJ SOLN
50.0000 ug | Freq: Once | INTRAMUSCULAR | Status: AC
  Administered 2016-02-07: 50 ug via INTRAVENOUS

## 2016-02-07 MED ORDER — AMIODARONE HCL IN DEXTROSE 360-4.14 MG/200ML-% IV SOLN
30.0000 mg/h | INTRAVENOUS | Status: DC
  Administered 2016-02-07: 30 mg/h via INTRAVENOUS
  Filled 2016-02-07: qty 200

## 2016-02-07 NOTE — ED Notes (Signed)
MD is aware of all of the patient's vital signs.

## 2016-02-07 NOTE — ED Provider Notes (Addendum)
CSN: [id number]     Arrival date & time 02/07/16  1226 History   First MD Initiated Contact with Patient 02/07/16 1301     Chief Complaint  Patient presents with  . Shortness of Breath     (Consider location/radiation/quality/duration/timing/severity/associated sxs/prior Treatment) Patient is a 75 y.o. female presenting with shortness of breath.  Shortness of Breath Severity:  Severe Duration:  2 days Timing:  Intermittent Progression:  Waxing and waning Chronicity:  New Context comment:  Thought that lungs were closing up from something in the air Ineffective treatments: tried coffee for shortness of breath "to open up lungs", "i never drink coffee" Associated symptoms: no abdominal pain, no chest pain, no cough, no fever, no headaches, no neck pain, no rash, no sore throat, no sputum production, no syncope, no vomiting and no wheezing   Risk factors: no hx of cancer, no hx of PE/DVT and no prolonged immobilization     Past Medical History  Diagnosis Date  . Allergy   . Depression   . Diabetes mellitus without complication ([place])   . Hypertension   . Seizures ([place])   . Thyroid disease    Past Surgical History  Procedure Laterality Date  . Abdominal hysterectomy    . Tonsillectomy     Family History  Problem Relation Age of Onset  . Diabetes Mother   . Stroke Mother   . Diabetes Brother   . Diabetes Maternal Grandmother   . Diabetes Paternal Grandfather   . Hypertension Brother    Social History  Substance Use Topics  . Smoking status: Current Every Day Smoker  . Smokeless tobacco: Never Used  . Alcohol Use: No   OB History    No data available     Review of Systems  Constitutional: Negative for fever.  HENT: Negative for sore throat.   Eyes: Negative for visual disturbance.  Respiratory: Positive for shortness of breath. Negative for cough, sputum production and wheezing.   Cardiovascular: Negative for chest pain and syncope.  Gastrointestinal: Negative  for nausea, vomiting and abdominal pain.  Genitourinary: Negative for difficulty urinating.  Musculoskeletal: Negative for back pain and neck pain.  Skin: Negative for rash.  Neurological: Positive for light-headedness. Negative for syncope and headaches. Weakness: generalized.      Allergies  Dust mite extract and Molds & smuts  Home Medications   Prior to Admission medications   Medication Sig Start Date End Date Taking? Authorizing Provider  Chlorpheniramine Maleate (CHLOR-TRIMETON PO) Take 0.25 tablets by mouth daily as needed (itching).   Yes Historical Provider, MD  EYEBRIGHT HERB PO Take 2 tablets by mouth daily.   Yes Historical Provider, MD  fluticasone (FLONASE SENSIMIST) 27.5 MCG/SPRAY nasal spray Place 2 sprays into the nose daily as needed for rhinitis.   Yes Historical Provider, MD  levETIRAcetam (KEPPRA) 500 MG tablet Take 500 mg by mouth at bedtime.   Yes Historical Provider, MD  MILK THISTLE PO Take 5 tablets by mouth daily as needed (liver function).   Yes Historical Provider, MD  OVER THE COUNTER MEDICATION Take 4 tablets by mouth daily. Hypothalamus 500mg s   Yes Historical Provider, MD  OVER THE COUNTER MEDICATION Take 880 tablets by mouth daily. Horsetail 440mg  tabs   Yes Historical Provider, MD  OVER THE COUNTER MEDICATION Take 2 tablets by mouth daily. Respiration blend sp-3 natural supplement   Yes Historical Provider, MD  OVER THE COUNTER MEDICATION Take 10 tablets by mouth daily. Liver rite herbal supplement   Yes  Historical Provider, MD  Specialty Vitamins Products (LYMPH PO) Take 1,000 mg by mouth daily. Lymph beef 250mg    Yes Historical Provider, MD  Specialty Vitamins Products (PITUITARY) 35 MG TABS Take 2-3 tablets by mouth daily.   Yes Historical Provider, MD  Superoxide Dismutase (S.O.D. PO) Take 5-10 tablets by mouth daily as needed (burning).   Yes Historical Provider, MD  cephALEXin (KEFLEX) 500 MG capsule Take 1 capsule (500 mg total) by mouth 3  (three) times daily. 07/23/15   Waldemar Dickens, MD  fluconazole (DIFLUCAN) 150 MG tablet Take 1 tablet (150 mg total) by mouth daily. Repeat dose in 3 days 07/23/15   Waldemar Dickens, MD   BP 188/120 mmHg  Pulse 87  Temp(Src) 97.6 F (36.4 C) (Oral)  Resp 27  SpO2 100% Physical Exam  Constitutional: She is oriented to person, place, and time. She appears well-developed and well-nourished. No distress.  HENT:  Head: Normocephalic and atraumatic.  Eyes: Conjunctivae and EOM are normal.  Neck: Normal range of motion.  Cardiovascular: Regular rhythm, normal heart sounds and intact distal pulses.  Tachycardia present.  Exam reveals no gallop and no friction rub.   No murmur heard. Pulmonary/Chest: Effort normal and breath sounds normal. No respiratory distress. She has no wheezes. She has no rales.  Abdominal: Soft. She exhibits no distension. There is no tenderness. There is no guarding.  Musculoskeletal: She exhibits no tenderness.       Right lower leg: She exhibits edema (trace).       Left lower leg: She exhibits edema (trace).  Neurological: She is alert and oriented to person, place, and time.  Skin: Skin is warm and dry. No rash noted. She is not diaphoretic. No erythema.  Nursing note and vitals reviewed.   ED Course  .Sedation Date/Time: 02/07/2016 8:34 PM Performed by: Gareth Morgan Authorized by: Gareth Morgan  Consent:    Consent obtained:  Verbal and emergent situation   Consent given by:  Patient   Risks discussed:  Allergic reaction, prolonged hypoxia resulting in organ damage, respiratory compromise necessitating ventilatory assistance and intubation, dysrhythmia, nausea and vomiting Indications:    Sedation purpose:  Cardioversion   Procedure necessitating sedation performed by:  Different physician   Intended level of sedation:  Moderate (conscious sedation) Pre-sedation assessment:    ASA classification: class 2 - patient with mild systemic disease     Neck  mobility: normal     Mouth opening:  3 or more finger widths   Thyromental distance:  3 finger widths   Pre-sedation assessments completed and reviewed: airway patency, cardiovascular function, hydration status, mental status, nausea/vomiting, pain level, respiratory function and temperature   Immediate pre-procedure details:    Reviewed: vital signs and relevant labs/tests     Verified: bag valve mask available, emergency equipment available, intubation equipment available, IV patency confirmed, oxygen available and suction available   Procedure details (see MAR for exact dosages):    Sedation start time:  02/07/2016 3:42 PM   Preoxygenation:  Nasal cannula   Sedation:  Etomidate   Intra-procedure monitoring:  Blood pressure monitoring, cardiac monitor, frequent LOC assessments, continuous capnometry, continuous pulse oximetry and frequent vital sign checks   Intra-procedure events comment:  Brief episode of apnea   Intra-procedure management:  Airway repositioning and BVM ventilation (very brief (approx 3sec))   Sedation end time:  02/07/2016 4:00 PM Post-procedure details:    Post-sedation assessment completed:  02/07/2016 4:05 PM   Attendance: Constant attendance by certified  staff until patient recovered     Recovery: Patient returned to pre-procedure baseline     Patient is stable for discharge or admission: Yes     Patient tolerance:  Tolerated well, no immediate complications  (including critical care time) Labs Review Labs Reviewed  BASIC METABOLIC PANEL - Abnormal; Notable for the following:    Sodium 133 (*)    CO2 14 (*)    Glucose, Bld 254 (*)    All other components within normal limits  D-DIMER, QUANTITATIVE (NOT AT [facility]) - Abnormal; Notable for the following:    D-Dimer, Quant 3.41 (*)    All other components within normal limits  TSH - Abnormal; Notable for the following:    TSH 0.261 (*)    All other components within normal limits  I-STAT CHEM 8, ED - Abnormal;  Notable for the following:    Glucose, Bld 246 (*)    All other components within normal limits  CBC  MAGNESIUM  T4, FREE  MAGNESIUM  TSH  TROPONIN I  TROPONIN I  HEMOGLOBIN A1C  BRAIN NATRIURETIC PEPTIDE  PROTIME-INR  BASIC METABOLIC PANEL  LIPID PANEL  CBC  I-STAT TROPOININ, ED    Imaging Review Dg Chest Portable 1 View  02/07/2016  CLINICAL DATA:  Tachycardia. EXAM: PORTABLE CHEST 1 VIEW COMPARISON:  None. FINDINGS: The heart size and mediastinal contours are within normal limits. Both lungs are clear. No pneumothorax or pleural effusion is noted. The visualized skeletal structures are unremarkable. IMPRESSION: No acute cardiopulmonary abnormality seen. Electronically Signed   By: Marijo Conception, M.D.   On: 02/07/2016 15:26   I have personally reviewed and evaluated these images and lab results as part of my medical decision-making.   EKG Interpretation   Date/Time:  Monday February 07 2016 12:42:09 EDT Ventricular Rate:  175 PR Interval:  80 QRS Duration: 154 QT Interval:  315 QTC Calculation: 537 R Axis:   -161 Text Interpretation:  tachycardia with wide complex Baseline wander in  lead(s) V1 Artifact Favor supraventricular rhythm Tachycardia new from  prior Confirmed by [facility] MD, Beatrice ([id number]) on 02/07/2016 1:04:44 PM      CRITICAL CARE: tachycardia Performed by: Alvino Chapel   Total critical care time: 30 minutes  Critical care time was exclusive of separately billable procedures and treating other patients.  Critical care was necessary to treat or prevent imminent or life-threatening deterioration.  Critical care was time spent personally by me on the following activities: development of treatment plan with patient and/or surrogate as well as nursing, discussions with consultants, evaluation of patient's response to treatment, examination of patient, obtaining history from patient or surrogate, ordering and performing treatments and  interventions, ordering and review of laboratory studies, ordering and review of radiographic studies, pulse oximetry and re-evaluation of patient's condition.  MDM   Final diagnoses:  SOB (shortness of breath)  SVT (supraventricular tachycardia) ([place])   75 year old female with a history of hypertension, thyroid disease, diabetes, and seizure disorder presents with concern for dyspnea for 2 days. Patient tachycardic to the 170s on arrival to the emergency department, with normal blood pressures and normal mentation.  Initially saw the patient's heart rate go to the 150s, and is unclear if patient was in atrial fibrillation with RVR versus SVT, patient was given 15 mg of diltiazem. She had no change in heart rate with this. She was then given 6 mg of adenosine, followed by 12 mg of adenosine 2 with no change in heart rate.  Ordered 150 of amiodarone and a drip, and consult cardiology. Dr. Tamala Julian came to bedside to evaluate the patient.  Possible etiologies of patient's tachycardia include underlying heart disease and SVT, possible pulmonary embolus, hyperthyroidism, effects of one of patient's supplements, other cardiac disease.    Patient given approx 300mg  total of amiodarone without significant change in rate.  Patient was sedated with etomidate and synchronized cardioversion was done at 150J with conversion to sinus rhythm with narrow complex.   Suspect rate related RBBB. Patient returned to baseline following cardioversion. Her symptoms have improved now that she is in sinus rhythm.  Patient to be admitted to cardiology for further care.   Patient with very low TSH, with free T4 pending.  DDimer elevated, however patient reports throat closing with IV contrast, and CT angio which was ordered was cancelled.  V/Q scan may be performed by admitting team.  Overall decreased suspicion however for PE with improvement of symptoms with resolution of SVT.  Patient to be transferred to [facility] for further  care.   Gareth Morgan, MD 02/07/16 1721  Gareth Morgan, MD 02/07/16 2041

## 2016-02-07 NOTE — ED Notes (Signed)
[facility] at bedside 

## 2016-02-07 NOTE — ED Notes (Signed)
Lab states they will run labs on previously collected blood.

## 2016-02-07 NOTE — ED Notes (Signed)
Cardiologist at bedside.  

## 2016-02-07 NOTE — ED Notes (Signed)
MD at bedside and aware of patient's vitals signs.

## 2016-02-07 NOTE — ED Notes (Addendum)
Pt cardioverted by Tamala Julian MD w/ 150 joules.

## 2016-02-07 NOTE — ED Notes (Signed)
[facility] notified of transport.  

## 2016-02-07 NOTE — ED Notes (Signed)
MD at bedside. 

## 2016-02-07 NOTE — Progress Notes (Signed)
ANTICOAGULATION CONSULT NOTE - Initial Consult  Pharmacy Consult for lovenox Indication: chest pain/ACS  Allergies  Allergen Reactions  . Dust Mite Extract Swelling  . Molds & Smuts Swelling    Patient Measurements:   Per patient: 150 lbs; 5' 1.5"  Vital Signs: Temp: 97.6 F (36.4 C) (03/27 1237) Temp Source: Oral (03/27 1237) BP: 185/109 mmHg (03/27 1738) Pulse Rate: 84 (03/27 1738)  Labs:  Recent Labs  02/07/16 1250 02/07/16 1303  HGB 13.3 14.6  HCT 38.3 43.0  PLT 355  --   CREATININE 0.76 0.60    CrCl cannot be calculated (Unknown ideal weight.).   Medical History: Past Medical History  Diagnosis Date  . Allergy   . Depression   . Diabetes mellitus without complication ([place])   . Hypertension   . Seizures ([place])   . Thyroid disease     Medications:  Scheduled:  . [START ON 02/08/2016] aspirin EC  81 mg Oral Daily  . atorvastatin  20 mg Oral q1800  . [START ON 02/08/2016] enoxaparin (LOVENOX) injection  70 mg Subcutaneous Q12H  . labetalol  20 mg Intravenous Once  . lisinopril  20 mg Oral Daily  . metoprolol tartrate  25 mg Oral BID   Infusions:  . sodium chloride 10 mL/hr at 02/07/16 1633  . amiodarone 60 mg/hr (02/07/16 1451)   Followed by  . amiodarone      Assessment: 75 y.o. female presents  02/07/2016 with SOB x 2 days and CP x 1 wk.  Noted with SVT + RBBB now s/p successful cardioversion.  D-dimer and troponins elevated.  PMH HTN, DMT2.  Cause of SVT unclear, NP wants to r/o ACS with Lovenox per pharmacy.   CBC: wnl  SCr: wnl; CrCl > 60 rounding SCr to 0.8  Previous anticoagulation: none   Goal of Therapy: Anti-Xa level 0.6-1 units/ml 4hrs after LMWH dose given  Plan:  Lovenox 85 mg SQ now, then 70 mg SQ q12 hr  Monitor for signs of bleeding or worsening thrombosis   Reuel Boom, PharmD, BCPS Pager: [phone] 02/07/2016, 5:52 PM

## 2016-02-07 NOTE — ED Notes (Addendum)
Patient is mentating well. Alert and oriented x4. MD states we will observe patient for right now. No new orders at this time

## 2016-02-07 NOTE — Consult Note (Deleted)
Reason for Consult: tachycardia SVT with RBBB   Referring Physician:  Dr. Billy Fischer ER MD   PCP:  No PCP Per Patient  Primary Cardiologist:New  Dr. Park Meo Colleen Lowery is an 75 y.o. female.    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 Of HTN, DM-2 and seizures now presents to ER with 2 day hx of SOB.  Beginning about 1 week ago she had some chest tightness that has persisted.  2 days ago she developed SOB.  She states she has a hx of "large heart" and anasarca.  CXR today heart size is within normal limits.  No cardiac cath no Echo in Epic, and none under care everywhere. She does see Neurology at [facility].    In ER HR SVT at 170 with RBBB.  Not aware of tachycardia.  She has had 2 6 mg of adenosine and 12 mg of adenosine without change.  Has also had 15 mg Cardizem.  Dr. Tamala Julian at bedside. Pt is now receiving 2nd amiodarone bolus.   BP is elevated.  She stated it was controlled but looking through home chart it has been running 150/117 and 170/114.  Her heart rate on 02/01/16 was 80 per her records.  She is alert and oriented.  No obvious distress except for SOB.      DDimer 3.41   TSH 0.261 Glucose 254 troponin 0.06   With no change in HR pt was notified of need for DCCV and was sedated and cardioverted with 150 watts successfully to SR.  No longer with RBBB.  Pt waking from DCCV.       Past Medical History  Diagnosis Date  . Allergy   . Depression   . Diabetes mellitus without complication ([place])   . Hypertension   . Seizures ([place])   . Thyroid disease     Past Surgical History  Procedure Laterality Date  . Abdominal hysterectomy    . Tonsillectomy      Family History  Problem Relation Age of Onset  . Diabetes Mother   . Stroke Mother   . Diabetes Brother   . Diabetes Maternal Grandmother   . Diabetes Paternal Grandfather   . Hypertension Brother    Social History:  reports  that she has been smoking.  She has never used smokeless tobacco. She reports that she does not drink alcohol or use illicit drugs.  Allergies:  Allergies  Allergen Reactions  . Dust Mite Extract Swelling  . Molds & Smuts Swelling    OUTPATIENT MEDICATIONS: flonase nasal spray levetiracetam 500  Mg at HS.  Eye bright herbal Milk thistle Pituitary herbal lymp beef Respiratory herbal Liver herbal.  She has more at home.  Results for orders placed or performed during the hospital encounter of 02/07/16 (from the past 48 hour(s))  Basic metabolic panel     Status: Abnormal   Collection Time: 02/07/16 12:50 PM  Result Value Ref Range   Sodium 133 (L) 135 - 145 mmol/L   Potassium 3.8 3.5 - 5.1 mmol/L   Chloride 106 101 - 111 mmol/L   CO2 14 (L) 22 - 32 mmol/L   Glucose, Bld 254 (H) 65 - 99 mg/dL   BUN 9 6 - 20 mg/dL   Creatinine, Ser 0.76 0.44 - 1.00 mg/dL   Calcium 9.4 8.9 - 10.3 mg/dL   GFR  calc non Af Amer >60 >60 mL/min   GFR calc Af Amer >60 >60 mL/min    Comment: (NOTE) The eGFR has been calculated using the CKD EPI equation. This calculation has not been validated in all clinical situations. eGFR's persistently <60 mL/min signify possible Chronic Kidney Disease.    Anion gap 13 5 - 15  CBC     Status: None   Collection Time: 02/07/16 12:50 PM  Result Value Ref Range   WBC 6.6 4.0 - 10.5 K/uL   RBC 4.52 3.87 - 5.11 MIL/uL   Hemoglobin 13.3 12.0 - 15.0 g/dL   HCT 38.3 36.0 - 46.0 %   MCV 84.7 78.0 - 100.0 fL   MCH 29.4 26.0 - 34.0 pg   MCHC 34.7 30.0 - 36.0 g/dL   RDW 15.0 11.5 - 15.5 %   Platelets 355 150 - 400 K/uL  D-dimer, quantitative (not at [facility])     Status: Abnormal   Collection Time: 02/07/16  1:00 PM  Result Value Ref Range   D-Dimer, Quant 3.41 (H) 0.00 - 0.50 ug/mL-FEU    Comment: (NOTE) At the manufacturer cut-off of 0.50 ug/mL FEU, this assay has been documented to exclude PE with a sensitivity and negative predictive value of 97 to 99%.  At  this time, this assay has not been approved by the FDA to exclude DVT/VTE. Results should be correlated with clinical presentation.   TSH     Status: Abnormal   Collection Time: 02/07/16  1:00 PM  Result Value Ref Range   TSH 0.261 (L) 0.350 - 4.500 uIU/mL  Magnesium     Status: None   Collection Time: 02/07/16  1:00 PM  Result Value Ref Range   Magnesium 1.9 1.7 - 2.4 mg/dL  I-stat troponin, ED (not at [facility], [facility])     Status: None   Collection Time: 02/07/16  1:01 PM  Result Value Ref Range   Troponin i, poc 0.06 0.00 - 0.08 ng/mL   Comment 3            Comment: Due to the release kinetics of cTnI, a negative result within the first hours of the onset of symptoms does not rule out myocardial infarction with certainty. If myocardial infarction is still suspected, repeat the test at appropriate intervals.   I-Stat Chem 8, ED     Status: Abnormal   Collection Time: 02/07/16  1:03 PM  Result Value Ref Range   Sodium 136 135 - 145 mmol/L   Potassium 3.7 3.5 - 5.1 mmol/L   Chloride 105 101 - 111 mmol/L   BUN 7 6 - 20 mg/dL   Creatinine, Ser 0.60 0.44 - 1.00 mg/dL   Glucose, Bld 246 (H) 65 - 99 mg/dL   Calcium, Ion 1.20 1.13 - 1.30 mmol/L   TCO2 15 0 - 100 mmol/L   Hemoglobin 14.6 12.0 - 15.0 g/dL   HCT 43.0 36.0 - 46.0 %   Dg Chest Portable 1 View  02/07/2016  CLINICAL DATA:  Tachycardia. EXAM: PORTABLE CHEST 1 VIEW COMPARISON:  None. FINDINGS: The heart size and mediastinal contours are within normal limits. Both lungs are clear. No pneumothorax or pleural effusion is noted. The visualized skeletal structures are unremarkable. IMPRESSION: No acute cardiopulmonary abnormality seen. Electronically Signed   By: Colleen Lowery, M.D.   On: 02/07/2016 15:26    ROS: General:no colds or fevers, no weight changes Skin:no rashes or ulcers HEENT:no blurred vision, no congestion CV:see HPI PUL:see HPI GI:no diarrhea  constipation or melena, no indigestion GU:no hematuria, no  dysuria MS:no joint pain, no claudication Neuro:no syncope, no lightheadedness Endo:+ diabetes, + thyroid disease   Blood pressure 157/91, pulse 77, temperature 97.6 F (36.4 C), temperature source Oral, resp. rate 26, SpO2 97 %.  Wt Readings from Last 3 Encounters:  11/03/14 153 lb 12.8 oz (69.763 kg)  09/01/14 151 lb (68.493 kg)  08/02/14 149 lb 8 oz (67.813 kg)    PE: General:Pleasant affect, NAD Skin:Warm and dry, brisk capillary refill HEENT:normocephalic, sclera clear, mucus membranes moist Neck:supple, + JVD improved after DCCV, no bruits  Heart:S1S2 RRR rapid prior to DCCV post without murmur, gallup, rub or click Lungs: with few rales,no rhonchi, or wheezes VHQ:IONG, non tender, + BS, do not palpate liver spleen or masses Ext:no lower ext edema, 2+ pedal pulses, 2+ radial pulses Neuro:alert and oriented X 3, MAE, follows commands, + facial symmetry    Assessment/Plan Principal Problem:   SVT (supraventricular tachycardia) (HCC)-no prior cardiac hx. No change in SVT with RBBB with adenosine 6 mg X 2 and 12 mg X 2 and 2 boluses of amiodarone.  Was given tamadate and DCCV with 150 joules.  Will continue amiodarone at 60 mg /min for 6 hours then decrease to 30 mg /min  Will admit to [facility].  Will check Echo. , serial troponins.   Active Problems:   SOB (shortness of breath) most likely due to tachycardia, will give one dose of lasix and some vascular congestion.   Diabetes ([place]) elevated will check HGB A1C and add SSI   Benign essential HTN elevated will add BB for now    Tobacco abuse instructed on stopping tobacco.   Elevated D dimer though sp02 has been stable on room air.  To have VQ of chest to rule out PE -pt with contrast allergy.     [place]  Nurse Practitioner Certified [place] Pager [phone] or after 5pm or weekends call [phone] 02/07/2016, 4:03 PM     The patient has been seen in conjunction with Cecilie Kicks, NP. All  aspects of care have been considered and discussed. The patient has been personally interviewed, examined, and all clinical data has been reviewed.   90 minutes of critical care time was spent with the patient obtaining a history, performing clinical assessment/exam, directing care, and ultimately deciding to perform urgent electrical cardioversion.  The patient received IV amiodarone 150 mg 2 and did not convert. Prior to this the patient had received adenosine 6 mg, 12 mg, and 12 mg without break in the tachycardia. 15 mg of IV diltiazem was also administered.  Exam did not demonstrate evidence of heart failure. Blood pressure was quite elevated despite the tachycardia which ranged between 165 and 175 bpm. EKG demonstrated wide complex tachycardia with a right bundle branch block type pattern. The suspicion was that this was a reentrant supraventricular tachycardia with right bundle-branch block aberration. There is no prior history of coronary disease or heart failure.  Cardioversion was performed after etomidate was administered by the EDP, Dr. Gareth Morgan. The patient received 150 J of biphasic energy 1 with reversion to sinus rhythm at a rate of 80 bpm and a narrow complex.  The patient awakened from etomidate without evidence of neurological sequela.  Blood pressure remained elevated.  Will ultimately have 2-D Doppler echocardiogram performed. We need to have electrophysiology to evaluate patient to be certain that we were not dealing with ventricular tachycardia.  The patient will need aggressive  blood pressure control.  Serial markers will be done to exclude myocardial infarction.  Not sure what to make of the elevated d-dimer, but I don't believe that the presentation was that of acute pulmonary embolism but rather primary arrhythmia.

## 2016-02-07 NOTE — Progress Notes (Signed)
RT at bedside for cardioversion. Pt tolerated well.

## 2016-02-07 NOTE — ED Notes (Signed)
Pt reports shortness of breath since last night.  Pt reports non-productive cough as well.  Denies any pain at this time.  Pt is A&Ox 4.   States she is having "stridor", having to "grunt"

## 2016-02-07 NOTE — ED Notes (Signed)
X-ray at bedside

## 2016-02-08 ENCOUNTER — Inpatient Hospital Stay (HOSPITAL_COMMUNITY): Payer: Medicare HMO

## 2016-02-08 DIAGNOSIS — R079 Chest pain, unspecified: Secondary | ICD-10-CM

## 2016-02-08 DIAGNOSIS — I5043 Acute on chronic combined systolic (congestive) and diastolic (congestive) heart failure: Secondary | ICD-10-CM

## 2016-02-08 LAB — GLUCOSE, CAPILLARY
Glucose-Capillary: 141 mg/dL — ABNORMAL HIGH (ref 65–99)
Glucose-Capillary: 156 mg/dL — ABNORMAL HIGH (ref 65–99)
Glucose-Capillary: 141 mg/dL — ABNORMAL HIGH (ref 65–99)
Glucose-Capillary: 152 mg/dL — ABNORMAL HIGH (ref 65–99)

## 2016-02-08 LAB — ECHOCARDIOGRAM COMPLETE: Weight: 2564.39 oz

## 2016-02-08 MED ORDER — TECHNETIUM TO 99M ALBUMIN AGGREGATED
4.0000 | Freq: Once | INTRAVENOUS | Status: AC | PRN
  Administered 2016-02-08: 4 via INTRAVENOUS

## 2016-02-08 MED ORDER — TECHNETIUM TC 99M DIETHYLENETRIAME-PENTAACETIC ACID: 31.0000 | Freq: Once | INTRAVENOUS | Status: DC | PRN

## 2016-02-08 MED ORDER — METOPROLOL TARTRATE 25 MG/10 ML ORAL SUSPENSION
25.0000 mg | Freq: Two times a day (BID) | ORAL | Status: DC
  Administered 2016-02-08 – 2016-02-09 (×4): 25 mg via ORAL
  Filled 2016-02-08 (×4): qty 10

## 2016-02-08 MED ORDER — ENOXAPARIN SODIUM 40 MG/0.4ML ~~LOC~~ SOLN
40.0000 mg | SUBCUTANEOUS | Status: DC
  Administered 2016-02-09 – 2016-02-12 (×3): 40 mg via SUBCUTANEOUS
  Filled 2016-02-08 (×3): qty 0.4

## 2016-02-08 NOTE — Consult Note (Addendum)
ELECTROPHYSIOLOGY CONSULT NOTE    Patient ID: Colleen Lowery MRN: [id number], DOB/AGE: 1941/04/20 75 y.o.  Admit date: 02/07/2016 Date of Consult: 02/08/2016  Primary Physician: No PCP Per Patient Primary Cardiologist: Dr. Tamala Julian (new)  Reason for Consultation: WCT  HPI: Colleen Lowery is a 75 y.o. female with PMHx of HTN, DM, and seizure d/o admitted to [facility] yesterday with a week of intermittent C and 2 day hx of increasing SOB.  In the ER she was noted to be in SVT with RBBB at 175bpm, treated with adenosine 44m and then 130mx2 without interruption, followed by 1523mV cardizem, then IV amiodarone bolus x2, followed by DCCV to SR with narrow QRS.  Presenting labs K+ 3.8, Mag 1.9, TSH 0.261, POC Trop 0.06, D.Dimer elevated 3.41  The patient tells me she has had all of her life the feeling like she has difficulty exhaling ("hard to get my air out") she has been self managing this over the years with various OTC and herbal therapies, she some years ago saw an allergist who told her she had allergic asthma/bronchitis particularly with mold.  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  She did not perceive her HR fast or feel palpitations, no near syncope or syncope.  She has no known hx of arrhythmias or palpitations.  Past Medical History  Diagnosis Date  . Allergy   . Depression   . [place] ([place] . Hypertension   . [place]iz[place] ([place] . Thyroid disease      Surgical History:  Past Surgical History  Procedure Laterality Date  . Abdominal hysterectomy    . Tonsillectomy       Prescriptions prior to admission  Medication Sig Dispense Refill Last Dose  . Chlorpheniramine Maleate (CHLOR-TRIMETON PO) Take 0.25 tablets by mouth daily as needed (itching).   02/06/2016 at  Unknown time  . EYEBRIGHT HERB PO Take 2 tablets by mouth daily.   02/05/2016  . fluticasone (FLONASE SENSIMIST) 27.5 MCG/SPRAY nasal spray Place 2 sprays into the nose daily as needed for rhinitis.   02/06/2016 at Unknown time  . levETIRAcetam (KEPPRA) 500 MG tablet Take 500 mg by mouth at bedtime.   Past Month at Unknown time  . MILK THISTLE PO Take 5 tablets by mouth daily as needed (liver function).   02/06/2016 at Unknown time  . OVER THE COUNTER MEDICATION Take 4 tablets by mouth daily. Hypothalamus 500m81m 02/06/2016 at Unknown time  . OVER THE COUNTER MEDICATION Take 880 tablets by mouth daily. Horsetail 440mg42ms   02/06/2016 at Unknown time  . OVER THE COUNTER MEDICATION Take 2 tablets by mouth daily. Respiration blend sp-3 natural supplement   02/05/2016  . OVER THE COUNTER MEDICATION Take 10 tablets by mouth daily. Liver rite herbal supplement   02/06/2016 at Unknown time  . Specialty Vitamins Products (LYMPH PO) Take 1,000 mg by mouth daily. Lymph beef 250mg 77m26/2017 at Unknown time  . Specialty Vitamins Products (PITUITARY) 35 MG TABS Take 2-3 tablets by mouth daily.   02/06/2016 at Unknown time  . Superoxide Dismutase (S.O.D. PO) Take 5-10 tablets by mouth daily as needed (burning).   02/06/2016 at Unknown time  . cephALEXin (KEFLEX) 500 MG capsule Take  1 capsule (500 mg total) by mouth 3 (three) times daily. 21 capsule 0   . fluconazole (DIFLUCAN) 150 MG tablet Take 1 tablet (150 mg total) by mouth daily. Repeat dose in 3 days 2 tablet 0     Inpatient Medications:  . aspirin EC  81 mg Oral Daily  . atorvastatin  20 mg Oral q1800  . enoxaparin (LOVENOX) injection  70 mg Subcutaneous Q12H  . lisinopril  20 mg Oral Daily  . metoprolol tartrate  25 mg Oral BID    Allergies:  Allergies  Allergen Reactions  . Dust Mite Extract Swelling  . Molds & Smuts Swelling    Social History   Social History  . Marital Status: Single    Spouse Name: N/A  . Number of Children: N/A  .  Years of Education: N/A   Occupational History  . Not on file.   Social History Main Topics  . Smoking status: Current Every Day Smoker  . Smokeless tobacco: Never Used  . Alcohol Use: No  . Drug Use: No  . Sexual Activity: No   Other Topics Concern  . Not on file   Social History Narrative     Family History  Problem Relation Age of Onset  . Diabetes Mother   . Stroke Mother   . Diabetes Brother   . Diabetes Maternal Grandmother   . Diabetes Paternal Grandfather   . Hypertension Brother      Review of Systems: All other systems reviewed and are otherwise negative except as noted above.  Physical Exam: Filed Vitals:   02/07/16 2026 02/08/16 0403 02/08/16 0410 02/08/16 0413  BP: 141/85 164/97 153/86   Pulse: 67  67   Temp:  97.7 F (36.5 C)    TempSrc:  Axillary    Resp: 20 18    Weight:    160 lb 4.4 oz (72.7 kg)  SpO2: 100% 100%      GEN- The patient is well appearing, alert and oriented x 3 today.   HEENT: normocephalic, atraumatic; sclera clear, conjunctiva pink; hearing intact; oropharynx clear; neck supple, no JVP Lymph- no cervical lymphadenopathy Lungs- Clear b/l, normal work of breathing.  No wheezes, ?rales in right base Heart- Regular rate and rhythm, no significant murmurs, rubs or gallops, PMI not laterally displaced GI- soft, non-tender, non-distended Extremities- no clubbing, cyanosis, or edema MS- no significant deformity or atrophy Skin- warm and dry, no rash or lesion Psych- euthymic mood, full affect Neuro- no gross deficits observed  Labs:   Lab Results  Component Value Date   WBC 6.6 02/07/2016   HGB 14.6 02/07/2016   HCT 43.0 02/07/2016   MCV 84.7 02/07/2016   PLT 355 02/07/2016    Recent Labs Lab 02/07/16 1250 02/07/16 1303  NA 133* 136  K 3.8 3.7  CL 106 105  CO2 14*  --   BUN 9 7  CREATININE 0.76 0.60  CALCIUM 9.4  --   GLUCOSE 254* 246*      Radiology/Studies:  Dg Chest Portable 1 View 02/07/2016  CLINICAL  DATA:  Tachycardia. EXAM: PORTABLE CHEST 1 VIEW COMPARISON:  None. FINDINGS: The heart size and mediastinal contours are within normal limits. Both lungs are clear. No pneumothorax or pleural effusion is noted. The visualized skeletal structures are unremarkable. IMPRESSION: No acute cardiopulmonary abnormality seen. Electronically Signed   By: Marijo Conception, M.D.   On: 02/07/2016 15:26    EKG: #1 WCT, RBBB, 175bpm #2 WCT, RBBB, 169bpm #3 SR,  no ischemic changes  TELEMETRY: SR since DCCV Echocardiogram ordered is pending    Assessment and Plan:   1. WCT     SVT with RBBB vs VT     Echo is pending     Started on BB     Elevated D.dimer, VQ done low probability  2. HTN     Improving with meds  3. suspected Fluid OL     S/p lasix     Pending her echo     By the patient's reports she had HR 160's for 2 days prior to coming in   Venetia Night, Hershal Coria 02/08/2016 10:28 AM  EP Attending  Patient seen and examined. Agree with the findings as noted above with minimal modification. The patient presents with a RBBB tachycardia which did not stop with IV adenosine and was cardioverted back to NSR. Her cardiac history is unremarkable. Her exam is also unremarkable and I have amended the one above to reflect my findings.  Her baseline ECG does not show much in the way of conduction system disease. A/P 1. RBBB tachycardia which did not terminate with IV adenosine - after reflection, I suspect this is VT. If her EF is down, she will need cardiac cath. If EF is normal, I would suggest MRI to look for gadolinium enhancement. If EF is down, she will need EP study and ICD.  2. HTN - her blood pressures are improving with medical therapy.  3. Dyspnea - also improved. She may need ongoing diuresis  Mikle Bosworth.D.

## 2016-02-08 NOTE — Progress Notes (Signed)
Inpatient Diabetes Program Recommendations  AACE/ADA: New Consensus Statement on Inpatient Glycemic Control (2015)  Target Ranges:  Prepandial:   less than 140 mg/dL      Peak postprandial:   less than 180 mg/dL (1-2 hours)      Critically ill patients:  140 - 180 mg/dL  Results for TAKIA, MONTEL (MRN [id number]) as of 02/08/2016 07:47  Ref. Range 02/07/2016 20:44 02/08/2016 06:15  Glucose-Capillary Latest Ref Range: 65-99 mg/dL 188 (H) 141 (H)   Results for FERNANDE, ORTEGO (MRN [id number]) as of 02/08/2016 07:47  Ref. Range 02/07/2016 12:50 02/07/2016 13:03  Glucose Latest Ref Range: 65-99 mg/dL 254 (H) 246 (H)   Review of Glycemic Control  Diabetes history: DM2 Outpatient Diabetes medications: None Current orders for Inpatient glycemic control: None  Inpatient Diabetes Program Recommendations: Correction (SSI): While inpatient, please consider ordering CBGs with Novolog correction scale ACHS.   Thanks, Barnie Alderman, RN, MSN, CDE Diabetes Coordinator Inpatient Diabetes Program [phone] (Team Pager from [place] to [place]) [phone] ([place] office) [phone] [facility] office) [phone] [facility] office)

## 2016-02-08 NOTE — Progress Notes (Signed)
  Echocardiogram 2D Echocardiogram has been performed.  Colleen Lowery 02/08/2016, 3:42 PM

## 2016-02-08 NOTE — H&P (Signed)
History and Physical Indication: WCT likely SVT with RBBB aberration  Referring Physician:  Dr. Billy Fischer ER MD   PCP:  No PCP Per Patient  Primary Cardiologist:New  Dr. Park Meo Colleen Lowery is an 75 y.o. female.    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 Of HTN, DM-2 and seizures now presents to ER with 2 day hx of SOB.  Beginning about 1 week ago she had some chest tightness that has persisted.  2 days ago she developed SOB.  She states she has a hx of "large heart" and anasarca.  CXR today heart size is within normal limits.  No cardiac cath no Echo in Epic, and none under care everywhere. She does see Neurology at [facility].    In ER HR SVT at 170 with RBBB.  Not aware of tachycardia.  She has had 2 6 mg of adenosine and 12 mg of adenosine without change.  Has also had 15 mg Cardizem.  Dr. Tamala Julian at bedside. Pt is now receiving 2nd amiodarone bolus.   BP is elevated.  She stated it was controlled but looking through home chart it has been running 150/117 and 170/114.  Her heart rate on 02/01/16 was 80 per her records.  She is alert and oriented.  No obvious distress except for SOB.      DDimer 3.41   TSH 0.261 Glucose 254 troponin 0.06   With no change in HR pt was notified of need for DCCV and was sedated and cardioverted with 150 watts successfully to SR.  No longer with RBBB.  Pt waking from DCCV.       Past Medical History  Diagnosis Date  . Allergy   . Depression   . Diabetes mellitus without complication ([place])   . Hypertension   . Seizures ([place])   . Thyroid disease     Past Surgical History  Procedure Laterality Date  . Abdominal hysterectomy    . Tonsillectomy      Family History  Problem Relation Age of Onset  . Diabetes Mother   . Stroke Mother   . Diabetes Brother   . Diabetes Maternal Grandmother   . Diabetes Paternal Grandfather   . Hypertension Brother    Social  History:  reports that she has been smoking.  She has never used smokeless tobacco. She reports that she does not drink alcohol or use illicit drugs.  Allergies:  Allergies  Allergen Reactions  . Dust Mite Extract Swelling  . Molds & Smuts Swelling    OUTPATIENT MEDICATIONS: flonase nasal spray levetiracetam 500  Mg at HS.  Eye bright herbal Milk thistle Pituitary herbal lymp beef Respiratory herbal Liver herbal.  She has more at home.  Results for orders placed or performed during the hospital encounter of 02/07/16 (from the past 48 hour(s))  Basic metabolic panel     Status: Abnormal   Collection Time: 02/07/16 12:50 PM  Result Value Ref Range   Sodium 133 (L) 135 - 145 mmol/L   Potassium 3.8 3.5 - 5.1 mmol/L   Chloride 106 101 - 111 mmol/L   CO2 14 (L) 22 - 32 mmol/L   Glucose, Bld 254 (H) 65 - 99 mg/dL   BUN 9 6 - 20 mg/dL   Creatinine, Ser 0.76 0.44 - 1.00 mg/dL   Calcium 9.4 8.9 - 10.3 mg/dL   GFR calc non Af Amer >  60 >60 mL/min   GFR calc Af Amer >60 >60 mL/min    Comment: (NOTE) The eGFR has been calculated using the CKD EPI equation. This calculation has not been validated in all clinical situations. eGFR's persistently <60 mL/min signify possible Chronic Kidney Disease.    Anion gap 13 5 - 15  CBC     Status: None   Collection Time: 02/07/16 12:50 PM  Result Value Ref Range   WBC 6.6 4.0 - 10.5 K/uL   RBC 4.52 3.87 - 5.11 MIL/uL   Hemoglobin 13.3 12.0 - 15.0 g/dL   HCT 38.3 36.0 - 46.0 %   MCV 84.7 78.0 - 100.0 fL   MCH 29.4 26.0 - 34.0 pg   MCHC 34.7 30.0 - 36.0 g/dL   RDW 15.0 11.5 - 15.5 %   Platelets 355 150 - 400 K/uL  D-dimer, quantitative (not at [facility])     Status: Abnormal   Collection Time: 02/07/16  1:00 PM  Result Value Ref Range   D-Dimer, Quant 3.41 (H) 0.00 - 0.50 ug/mL-FEU    Comment: (NOTE) At the manufacturer cut-off of 0.50 ug/mL FEU, this assay has been documented to exclude PE with a sensitivity and negative predictive value of  97 to 99%.  At this time, this assay has not been approved by the FDA to exclude DVT/VTE. Results should be correlated with clinical presentation.   TSH     Status: Abnormal   Collection Time: 02/07/16  1:00 PM  Result Value Ref Range   TSH 0.261 (L) 0.350 - 4.500 uIU/mL  Magnesium     Status: None   Collection Time: 02/07/16  1:00 PM  Result Value Ref Range   Magnesium 1.9 1.7 - 2.4 mg/dL  I-stat troponin, ED (not at [facility], [facility])     Status: None   Collection Time: 02/07/16  1:01 PM  Result Value Ref Range   Troponin i, poc 0.06 0.00 - 0.08 ng/mL   Comment 3            Comment: Due to the release kinetics of cTnI, a negative result within the first hours of the onset of symptoms does not rule out myocardial infarction with certainty. If myocardial infarction is still suspected, repeat the test at appropriate intervals.   I-Stat Chem 8, ED     Status: Abnormal   Collection Time: 02/07/16  1:03 PM  Result Value Ref Range   Sodium 136 135 - 145 mmol/L   Potassium 3.7 3.5 - 5.1 mmol/L   Chloride 105 101 - 111 mmol/L   BUN 7 6 - 20 mg/dL   Creatinine, Ser 0.60 0.44 - 1.00 mg/dL   Glucose, Bld 246 (H) 65 - 99 mg/dL   Calcium, Ion 1.20 1.13 - 1.30 mmol/L   TCO2 15 0 - 100 mmol/L   Hemoglobin 14.6 12.0 - 15.0 g/dL   HCT 43.0 36.0 - 46.0 %   Dg Chest Portable 1 View  02/07/2016  CLINICAL DATA:  Tachycardia. EXAM: PORTABLE CHEST 1 VIEW COMPARISON:  None. FINDINGS: The heart size and mediastinal contours are within normal limits. Both lungs are clear. No pneumothorax or pleural effusion is noted. The visualized skeletal structures are unremarkable. IMPRESSION: No acute cardiopulmonary abnormality seen. Electronically Signed   By: Marijo Conception, M.D.   On: 02/07/2016 15:26    ROS: General:no colds or fevers, no weight changes Skin:no rashes or ulcers HEENT:no blurred vision, no congestion CV:see HPI PUL:see HPI GI:no diarrhea constipation or melena, no  indigestion GU:no  hematuria, no dysuria MS:no joint pain, no claudication Neuro:no syncope, no lightheadedness Endo:+ diabetes, + thyroid disease   Blood pressure 157/91, pulse 77, temperature 97.6 F (36.4 C), temperature source Oral, resp. rate 26, SpO2 97 %.  Wt Readings from Last 3 Encounters:  11/03/14 153 lb 12.8 oz (69.763 kg)  09/01/14 151 lb (68.493 kg)  08/02/14 149 lb 8 oz (67.813 kg)    PE: General:Pleasant affect, NAD Skin:Warm and dry, brisk capillary refill HEENT:normocephalic, sclera clear, mucus membranes moist Neck:supple, + JVD improved after DCCV, no bruits  Heart:S1S2 RRR rapid prior to DCCV post without murmur, gallup, rub or click Lungs: with few rales,no rhonchi, or wheezes DGL:OVFI, non tender, + BS, do not palpate liver spleen or masses Ext:no lower ext edema, 2+ pedal pulses, 2+ radial pulses Neuro:alert and oriented X 3, MAE, follows commands, + facial symmetry    Assessment/Plan Principal Problem:   SVT (supraventricular tachycardia) (HCC)-no prior cardiac hx. No change in SVT with RBBB with adenosine 6 mg X 2 and 12 mg X 2 and 2 boluses of amiodarone.  Was given tamadate and DCCV with 150 joules.  Will continue amiodarone at 60 mg /min for 6 hours then decrease to 30 mg /min  Will admit to [facility].  Will check Echo. , serial troponins.   Active Problems:   SOB (shortness of breath) most likely due to tachycardia, will give one dose of lasix and some vascular congestion.   Diabetes ([place]) elevated will check HGB A1C and add SSI   Benign essential HTN elevated will add BB for now    Tobacco abuse instructed on stopping tobacco.   Elevated D dimer though sp02 has been stable on room air.  To have VQ of chest to rule out PE -pt with contrast allergy.     [place]  Nurse Practitioner Certified [place] Pager [phone] or after 5pm or weekends call [phone] 02/07/2016, 4:03 PM     The patient has been seen in conjunction with Cecilie Kicks, NP. All aspects of care have been considered and discussed. The patient has been personally interviewed, examined, and all clinical data has been reviewed.   90 minutes of critical care time was spent with the patient obtaining a history, performing clinical assessment/exam, directing care, and ultimately deciding to perform urgent electrical cardioversion.  The patient received IV amiodarone 150 mg 2 and did not convert. Prior to this the patient had received adenosine 6 mg, 12 mg, and 12 mg without break in the tachycardia. 15 mg of IV diltiazem was also administered.  Exam did not demonstrate evidence of heart failure. Blood pressure was quite elevated despite the tachycardia which ranged between 165 and 175 bpm. EKG demonstrated wide complex tachycardia with a right bundle branch block type pattern. The suspicion was that this was a reentrant supraventricular tachycardia with right bundle-branch block aberration. There is no prior history of coronary disease or heart failure.  Cardioversion was performed after etomidate was administered by the EDP, Dr. Gareth Morgan. The patient received 150 J of biphasic energy 1 with reversion to sinus rhythm at a rate of 80 bpm and a narrow complex.  The patient awakened from etomidate without evidence of neurological sequela.  Blood pressure remained elevated.  Will ultimately have 2-D Doppler echocardiogram performed. We need to have electrophysiology to evaluate patient to be certain that we were not dealing with ventricular tachycardia.  The patient will need aggressive blood pressure control.  Serial markers will be done to exclude myocardial infarction.  Not sure what to make of the elevated d-dimer, but I don't believe that the presentation was that of acute pulmonary embolism but rather primary arrhythmia.

## 2016-02-08 NOTE — Progress Notes (Addendum)
       Patient Name: Colleen Lowery Date of Encounter: 02/08/2016    SUBJECTIVE: Feels better  TELEMETRY:  NSR: Filed Vitals:   02/07/16 2026 02/08/16 0403 02/08/16 0410 02/08/16 0413  BP: 141/85 164/97 153/86   Pulse: 67  67   Temp:  97.7 F (36.5 C)    TempSrc:  Axillary    Resp: 20 18    Weight:    160 lb 4.4 oz (72.7 kg)  SpO2: 100% 100%     No intake or output data in the 24 hours ending 02/08/16 0806 LABS: Basic Metabolic Panel:  Recent Labs  02/07/16 1250 02/07/16 1300 02/07/16 1303 02/07/16 1728  NA 133*  --  136  --   K 3.8  --  3.7  --   CL 106  --  105  --   CO2 14*  --   --   --   GLUCOSE 254*  --  246*  --   BUN 9  --  7  --   CREATININE 0.76  --  0.60  --   CALCIUM 9.4  --   --   --   MG  --  1.9  --  1.9   CBC:  Recent Labs  02/07/16 1250 02/07/16 1303  WBC 6.6  --   HGB 13.3 14.6  HCT 38.3 43.0  MCV 84.7  --   PLT 355  --    Cardiac Enzymes:  Recent Labs  02/07/16 1728  TROPONINI 0.07*   BNP    Component Value Date/Time   BNP 1646.8* 02/07/2016 1728    ProBNP No results found for: PROBNP     Radiology/Studies:  CXR with NAD  Physical Exam: Blood pressure 153/86, pulse 67, temperature 97.7 F (36.5 C), temperature source Axillary, resp. rate 18, weight 160 lb 4.4 oz (72.7 kg), SpO2 100 %. Weight change:   Wt Readings from Last 3 Encounters:  02/08/16 160 lb 4.4 oz (72.7 kg)  11/03/14 153 lb 12.8 oz (69.763 kg)  09/01/14 151 lb (68.493 kg)   S4  ASSESSMENT:  1. 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 Elevated d-dimer 4. Poor BP control.  Plan:  1. DC amiodarone 2. ECG 3. Beta blocker 4. Echo 5. I and O 6. EP oversight.  Demetrios Isaacs 02/08/2016, 8:06 AM

## 2016-02-09 DIAGNOSIS — Z91041 Radiographic dye allergy status: Secondary | ICD-10-CM

## 2016-02-09 DIAGNOSIS — I5043 Acute on chronic combined systolic (congestive) and diastolic (congestive) heart failure: Secondary | ICD-10-CM | POA: Diagnosis present

## 2016-02-09 DIAGNOSIS — I472 Ventricular tachycardia, unspecified: Secondary | ICD-10-CM

## 2016-02-09 LAB — BASIC METABOLIC PANEL
Anion gap: 9 (ref 5–15)
BUN: 12 mg/dL (ref 6–20)
CO2: 22 mmol/L (ref 22–32)
Calcium: 8.9 mg/dL (ref 8.9–10.3)
Chloride: 111 mmol/L (ref 101–111)
Creatinine, Ser: 0.86 mg/dL (ref 0.44–1.00)
GFR calc Af Amer: >60 mL/min (ref >60)
GFR calc non Af Amer: >60 mL/min (ref >60)
Glucose, Bld: 153 mg/dL — ABNORMAL HIGH (ref 65–99)
Potassium: 3.7 mmol/L (ref 3.5–5.1)
Sodium: 142 mmol/L (ref 135–145)

## 2016-02-09 LAB — HEMOGLOBIN A1C
Hgb A1c MFr Bld: 7.2 % — ABNORMAL HIGH (ref 4.8–5.6)
Mean Plasma Glucose: 160 mg/dL

## 2016-02-09 LAB — GLUCOSE, CAPILLARY: Glucose-Capillary: 121 mg/dL — ABNORMAL HIGH (ref 65–99)

## 2016-02-09 MED ORDER — SODIUM CHLORIDE 0.9 % WEIGHT BASED INFUSION
1.0000 mL/kg/h | INTRAVENOUS | Status: DC
  Administered 2016-02-10: 1 mL/kg/h via INTRAVENOUS

## 2016-02-09 MED ORDER — ASPIRIN 81 MG PO CHEW
81.0000 mg | CHEWABLE_TABLET | ORAL | Status: AC
  Administered 2016-02-10: 81 mg via ORAL
  Filled 2016-02-09: qty 1

## 2016-02-09 MED ORDER — FAMOTIDINE IN NACL 20-0.9 MG/50ML-% IV SOLN
20.0000 mg | INTRAVENOUS | Status: AC
  Administered 2016-02-10: 20 mg via INTRAVENOUS
  Filled 2016-02-09: qty 50

## 2016-02-09 MED ORDER — PREDNISONE 20 MG PO TABS
60.0000 mg | ORAL_TABLET | ORAL | Status: AC
  Administered 2016-02-10: 60 mg via ORAL
  Filled 2016-02-09: qty 3

## 2016-02-09 MED ORDER — FUROSEMIDE 40 MG PO TABS
40.0000 mg | ORAL_TABLET | Freq: Every day | ORAL | Status: DC
  Administered 2016-02-09: 40 mg via ORAL
  Filled 2016-02-09: qty 1

## 2016-02-09 MED ORDER — METHYLPREDNISOLONE SODIUM SUCC 125 MG IJ SOLR
125.0000 mg | INTRAMUSCULAR | Status: AC
  Administered 2016-02-10: 125 mg via INTRAVENOUS
  Filled 2016-02-09 (×2): qty 2

## 2016-02-09 MED ORDER — SODIUM CHLORIDE 0.9 % IV SOLN: 250.0000 mL | INTRAVENOUS | Status: DC | PRN

## 2016-02-09 MED ORDER — FAMOTIDINE 20 MG PO TABS
20.0000 mg | ORAL_TABLET | ORAL | Status: AC
  Administered 2016-02-10: 20 mg via ORAL
  Filled 2016-02-09: qty 1

## 2016-02-09 MED ORDER — SODIUM CHLORIDE 0.9% FLUSH: 3.0000 mL | INTRAVENOUS | Status: DC | PRN

## 2016-02-09 MED ORDER — SODIUM CHLORIDE 0.9% FLUSH
3.0000 mL | Freq: Two times a day (BID) | INTRAVENOUS | Status: DC
  Administered 2016-02-10: 3 mL via INTRAVENOUS

## 2016-02-09 MED ORDER — DIPHENHYDRAMINE HCL 50 MG/ML IJ SOLN
25.0000 mg | INTRAMUSCULAR | Status: AC
  Administered 2016-02-10: 25 mg via INTRAVENOUS
  Filled 2016-02-09: qty 1
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
## 2016-02-09 NOTE — Progress Notes (Signed)
Patient Name: Colleen Lowery Date of Encounter: 02/09/2016    SUBJECTIVE: Cough and still feels there is chest congestion. EF 40%. EP has concern that WCT was VT.  TELEMETRY:  NSR Filed Vitals:   02/08/16 1938 02/08/16 2156 02/09/16 0300 02/09/16 0946  BP: 201/95 163/95 153/82 172/82  Pulse: 84  75 79  Temp: 97.6 F (36.4 C)  97.8 F (36.6 C)   TempSrc: Oral  Oral   Resp: 19  19   Weight:   159 lb 8 oz (72.349 kg)   SpO2: 100%  98%     Intake/Output Summary (Last 24 hours) at 02/09/16 1117 Last data filed at 02/09/16 1104  Gross per 24 hour  Intake    480 ml  Output   2850 ml  Net  -2370 ml   LABS: Basic Metabolic Panel:  Recent Labs  02/07/16 1250 02/07/16 1300 02/07/16 1303 02/07/16 1728  NA 133*  --  136  --   K 3.8  --  3.7  --   CL 106  --  105  --   CO2 14*  --   --   --   GLUCOSE 254*  --  246*  --   BUN 9  --  7  --   CREATININE 0.76  --  0.60  --   CALCIUM 9.4  --   --   --   MG  --  1.9  --  1.9   CBC:  Recent Labs  02/07/16 1250 02/07/16 1303  WBC 6.6  --   HGB 13.3 14.6  HCT 38.3 43.0  MCV 84.7  --   PLT 355  --    Cardiac Enzymes:  Recent Labs  02/07/16 1728  TROPONINI 0.07*   BNP: Invalid input(s): POCBNP Hemoglobin A1C:  Recent Labs  02/07/16 1728  HGBA1C 7.2*   V/Q Lung Scan: Low probability  ECHOCARDIOGRAM: 02/08/16 ------------------------------------------------------------------- Study Conclusions  - Left ventricle: The cavity size was moderately to severely  dilated. There was mild concentric hypertrophy. Systolic function  was mildly to moderately reduced. The estimated ejection fraction  was in the range of 40% to 45%. Mild hypokinesis of the inferior  and inferoseptal myocardium. Features are consistent with a  pseudonormal left ventricular filling pattern, with concomitant  abnormal relaxation and increased filling pressure (grade 2  diastolic dysfunction). Doppler parameters are  consistent with  elevated mean left atrial filling pressure. - Aortic valve: There was no stenosis. There was no regurgitation. - Mitral valve: Mobility of the anterior and posterior leaflet was  restricted. There was mild regurgitation. Effective regurgitant  orifice (PISA): 0.12 cm^2. - Left atrium: The atrium was severely dilated. - Right ventricle: The cavity size was normal. Wall thickness was  normal. Systolic function was normal. - Pulmonary arteries: Systolic pressure was mildly increased. PA  peak pressure: 44 mm Hg (S). - Inferior vena cava: The vessel was dilated. The respirophasic  diameter changes were in the normal range (>= 50%), consistent  with elevated central venous pressure. - Pericardium, extracardiac: There    Radiology/Studies:  No new data  Physical Exam: Blood pressure 172/82, pulse 79, temperature 97.8 F (36.6 C), temperature source Oral, resp. rate 19, weight 159 lb 8 oz (72.349 kg), SpO2 98 %. Weight change: -3 lb (-1.361 kg)  Wt Readings from Last 3 Encounters:  02/09/16 159 lb 8 oz (72.349 kg)  11/03/14 153 lb 12.8 oz (69.763 kg)  09/01/14 151 lb (68.493  kg)   Moderate JVD. S4.  Expiratory wheezes and rhonchi  ASSESSMENT:  1. Acute systolic and diastolic heart failure 2. WCT VT vs SVT with aberration 3. Severe systemic hypertension  Plan:  1. BMET 2. Left and right heart cath in AM 3. Contrast allergy prophylaxis 4. EP f/u 5. The patient was counseled to undergo left heart catheterization, coronary angiography, and possible percutaneous coronary intervention with stent implantation. The procedural risks and benefits were discussed in detail. The risks discussed included death, stroke, myocardial infarction, life-threatening bleeding, limb ischemia, kidney injury, allergy, and possible emergency cardiac surgery. The risk of these significant complications were estimated to occur less than 1% of the time. After discussion, the patient  has agreed to proceed.  Demetrios Isaacs 02/09/2016, 11:17 AM

## 2016-02-10 ENCOUNTER — Encounter (HOSPITAL_COMMUNITY): Payer: Self-pay | Admitting: Cardiology

## 2016-02-10 ENCOUNTER — Encounter (HOSPITAL_COMMUNITY)
Admission: EM | Disposition: A | Discharge status: Home / Self Care | Payer: Self-pay | Source: Home / Self Care | Attending: Interventional Cardiology

## 2016-02-10 DIAGNOSIS — I2510 Atherosclerotic heart disease of native coronary artery without angina pectoris: Secondary | ICD-10-CM

## 2016-02-10 HISTORY — PX: CARDIAC CATHETERIZATION: SHX172

## 2016-02-10 HISTORY — PX: EP IMPLANTABLE DEVICE: SHX172B

## 2016-02-10 LAB — POCT I-STAT 3, ART BLOOD GAS (G3+)
Acid-base deficit: 3 mmol/L — ABNORMAL HIGH (ref 0.0–2.0)
Bicarbonate: 21.6 mEq/L (ref 20.0–24.0)
O2 Saturation: 91 %
TCO2: 23 mmol/L (ref 0–100)
pCO2 arterial: 37.7 mmHg (ref 35.0–45.0)
pH, Arterial: 7.366 (ref 7.350–7.450)
pO2, Arterial: 64 mmHg — ABNORMAL LOW (ref 80.0–100.0)

## 2016-02-10 LAB — BASIC METABOLIC PANEL
Anion gap: 8 (ref 5–15)
BUN: 12 mg/dL (ref 6–20)
CO2: 24 mmol/L (ref 22–32)
Calcium: 8.8 mg/dL — ABNORMAL LOW (ref 8.9–10.3)
Chloride: 111 mmol/L (ref 101–111)
Creatinine, Ser: 0.76 mg/dL (ref 0.44–1.00)
GFR calc Af Amer: >60 mL/min (ref >60)
GFR calc non Af Amer: >60 mL/min (ref >60)
Glucose, Bld: 123 mg/dL — ABNORMAL HIGH (ref 65–99)
Potassium: 3.3 mmol/L — ABNORMAL LOW (ref 3.5–5.1)
Sodium: 143 mmol/L (ref 135–145)

## 2016-02-10 LAB — CBC
HCT: 36.2 % (ref 36.0–46.0)
Hemoglobin: 11.9 g/dL — ABNORMAL LOW (ref 12.0–15.0)
MCH: 28.3 pg (ref 26.0–34.0)
MCHC: 32.9 g/dL (ref 30.0–36.0)
MCV: 86 fL (ref 78.0–100.0)
Platelets: 265 10*3/uL (ref 150–400)
RBC: 4.21 MIL/uL (ref 3.87–5.11)
RDW: 15.1 % (ref 11.5–15.5)
WBC: 4.8 10*3/uL (ref 4.0–10.5)

## 2016-02-10 LAB — CREATININE, SERUM
Creatinine, Ser: 0.82 mg/dL (ref 0.44–1.00)
GFR calc Af Amer: >60 mL/min (ref >60)
GFR calc non Af Amer: >60 mL/min (ref >60)

## 2016-02-10 LAB — POCT I-STAT 3, VENOUS BLOOD GAS (G3P V)
Acid-base deficit: 2 mmol/L (ref 0.0–2.0)
Bicarbonate: 23.1 mEq/L (ref 20.0–24.0)
O2 Saturation: 66 %
TCO2: 24 mmol/L (ref 0–100)
pCO2, Ven: 40.1 mmHg — ABNORMAL LOW (ref 45.0–50.0)
pH, Ven: 7.367 — ABNORMAL HIGH (ref 7.250–7.300)
pO2, Ven: 35 mmHg (ref 31.0–45.0)

## 2016-02-10 SURGERY — ICD IMPLANT
Anesthesia: LOCAL

## 2016-02-10 SURGERY — RIGHT/LEFT HEART CATH AND CORONARY ANGIOGRAPHY

## 2016-02-10 MED ORDER — ACETAMINOPHEN 325 MG PO TABS
325.0000 mg | ORAL_TABLET | ORAL | Status: DC | PRN
  Administered 2016-02-10: 650 mg via ORAL
  Filled 2016-02-10 (×2): qty 2

## 2016-02-10 MED ORDER — SODIUM CHLORIDE 0.9% FLUSH: 3.0000 mL | INTRAVENOUS | Status: DC | PRN

## 2016-02-10 MED ORDER — FENTANYL CITRATE (PF) 100 MCG/2ML IJ SOLN
INTRAMUSCULAR | Status: DC | PRN
  Administered 2016-02-10: 25 ug via INTRAVENOUS

## 2016-02-10 MED ORDER — FUROSEMIDE 40 MG PO TABS
40.0000 mg | ORAL_TABLET | Freq: Two times a day (BID) | ORAL | Status: DC
  Administered 2016-02-10 – 2016-02-11 (×2): 40 mg via ORAL
  Filled 2016-02-10 (×2): qty 1

## 2016-02-10 MED ORDER — NITROGLYCERIN 1 MG/10 ML FOR IR/CATH LAB
INTRA_ARTERIAL | Status: DC | PRN
  Administered 2016-02-10: 200 ug via INTRA_ARTERIAL

## 2016-02-10 MED ORDER — PREDNISONE 20 MG PO TABS: 60.0000 mg | ORAL_TABLET | Freq: Every day | ORAL | Status: DC

## 2016-02-10 MED ORDER — CEFAZOLIN SODIUM 1-5 GM-% IV SOLN
1.0000 g | Freq: Four times a day (QID) | INTRAVENOUS | Status: AC
  Administered 2016-02-10 – 2016-02-11 (×3): 1 g via INTRAVENOUS
  Filled 2016-02-10 (×3): qty 50

## 2016-02-10 MED ORDER — HEPARIN (PORCINE) IN NACL 2-0.9 UNIT/ML-% IJ SOLN
INTRAMUSCULAR | Status: DC | PRN
  Administered 2016-02-10: 500 mL

## 2016-02-10 MED ORDER — FENTANYL CITRATE (PF) 100 MCG/2ML IJ SOLN
INTRAMUSCULAR | Status: AC
  Filled 2016-02-10: qty 2

## 2016-02-10 MED ORDER — MIDAZOLAM HCL 2 MG/2ML IJ SOLN
INTRAMUSCULAR | Status: DC | PRN
  Administered 2016-02-10: 2 mg via INTRAVENOUS

## 2016-02-10 MED ORDER — LIDOCAINE HCL (PF) 1 % IJ SOLN
INTRAMUSCULAR | Status: DC | PRN
  Administered 2016-02-10: 20 mL

## 2016-02-10 MED ORDER — SODIUM CHLORIDE 0.9 % IR SOLN
Status: AC
  Filled 2016-02-10: qty 2

## 2016-02-10 MED ORDER — HEPARIN (PORCINE) IN NACL 2-0.9 UNIT/ML-% IJ SOLN
INTRAMUSCULAR | Status: AC
  Filled 2016-02-10: qty 1000

## 2016-02-10 MED ORDER — DIPHENHYDRAMINE HCL 50 MG/ML IJ SOLN
INTRAMUSCULAR | Status: AC
  Filled 2016-02-10: qty 1

## 2016-02-10 MED ORDER — HEPARIN (PORCINE) IN NACL 2-0.9 UNIT/ML-% IJ SOLN
INTRAMUSCULAR | Status: AC
  Filled 2016-02-10: qty 500

## 2016-02-10 MED ORDER — LABETALOL HCL 5 MG/ML IV SOLN
INTRAVENOUS | Status: AC
  Filled 2016-02-10: qty 4

## 2016-02-10 MED ORDER — LABETALOL HCL 5 MG/ML IV SOLN
INTRAVENOUS | Status: DC | PRN
  Administered 2016-02-10 (×2): 10 mg via INTRAVENOUS

## 2016-02-10 MED ORDER — SODIUM CHLORIDE 0.9% FLUSH
3.0000 mL | Freq: Two times a day (BID) | INTRAVENOUS | Status: DC
  Administered 2016-02-10 – 2016-02-11 (×2): 3 mL via INTRAVENOUS

## 2016-02-10 MED ORDER — MIDAZOLAM HCL 5 MG/5ML IJ SOLN
INTRAMUSCULAR | Status: DC | PRN
  Administered 2016-02-10: 1 mg via INTRAVENOUS
  Administered 2016-02-10 (×2): 2 mg via INTRAVENOUS

## 2016-02-10 MED ORDER — LABETALOL HCL 5 MG/ML IV SOLN
20.0000 mg | Freq: Once | INTRAVENOUS | Status: AC
  Administered 2016-02-10: 20 mg via INTRAVENOUS

## 2016-02-10 MED ORDER — HYDRALAZINE HCL 20 MG/ML IJ SOLN
INTRAMUSCULAR | Status: DC | PRN
  Administered 2016-02-10 (×2): 10 mg via INTRAVENOUS

## 2016-02-10 MED ORDER — CEFAZOLIN SODIUM-DEXTROSE 2-3 GM-% IV SOLR
INTRAVENOUS | Status: AC
  Filled 2016-02-10: qty 50

## 2016-02-10 MED ORDER — SODIUM CHLORIDE 0.9 % IR SOLN
Status: DC | PRN
  Administered 2016-02-10: 17:00:00

## 2016-02-10 MED ORDER — MAGNESIUM HYDROXIDE 400 MG/5ML PO SUSP
30.0000 mL | Freq: Every day | ORAL | Status: DC | PRN
  Administered 2016-02-11: 30 mL via ORAL
  Filled 2016-02-10: qty 30

## 2016-02-10 MED ORDER — POTASSIUM CHLORIDE CRYS ER 20 MEQ PO TBCR
20.0000 meq | EXTENDED_RELEASE_TABLET | Freq: Every day | ORAL | Status: DC
  Administered 2016-02-11 – 2016-02-12 (×2): 20 meq via ORAL
  Filled 2016-02-10 (×2): qty 1

## 2016-02-10 MED ORDER — LIDOCAINE HCL (PF) 1 % IJ SOLN
INTRAMUSCULAR | Status: DC | PRN
  Administered 2016-02-10: 30 mL

## 2016-02-10 MED ORDER — NITROGLYCERIN 1 MG/10 ML FOR IR/CATH LAB
INTRA_ARTERIAL | Status: AC
  Filled 2016-02-10: qty 10

## 2016-02-10 MED ORDER — ONDANSETRON HCL 4 MG/2ML IJ SOLN: 4.0000 mg | Freq: Four times a day (QID) | INTRAMUSCULAR | Status: DC | PRN

## 2016-02-10 MED ORDER — HEPARIN (PORCINE) IN NACL 2-0.9 UNIT/ML-% IJ SOLN
INTRAMUSCULAR | Status: DC | PRN
  Administered 2016-02-10: 1000 mL

## 2016-02-10 MED ORDER — SODIUM CHLORIDE 0.9 % IV SOLN: INTRAVENOUS | Status: DC

## 2016-02-10 MED ORDER — FAMOTIDINE IN NACL 20-0.9 MG/50ML-% IV SOLN
INTRAVENOUS | Status: AC
Start: 2016-02-10 — End: 2016-02-10
  Filled 2016-02-10: qty 50

## 2016-02-10 MED ORDER — MIDAZOLAM HCL 5 MG/5ML IJ SOLN
INTRAMUSCULAR | Status: AC
  Filled 2016-02-10: qty 5

## 2016-02-10 MED ORDER — LIDOCAINE HCL (PF) 1 % IJ SOLN
INTRAMUSCULAR | Status: AC
  Filled 2016-02-10: qty 30

## 2016-02-10 MED ORDER — CEFAZOLIN SODIUM-DEXTROSE 2-3 GM-% IV SOLR
INTRAVENOUS | Status: DC | PRN
  Administered 2016-02-10: 2 g via INTRAVENOUS

## 2016-02-10 MED ORDER — SODIUM CHLORIDE 0.9 % WEIGHT BASED INFUSION: 1.0000 mL/kg/h | INTRAVENOUS | Status: DC

## 2016-02-10 MED ORDER — POTASSIUM CHLORIDE CRYS ER 20 MEQ PO TBCR: 40.0000 meq | EXTENDED_RELEASE_TABLET | Freq: Once | ORAL | Status: DC

## 2016-02-10 MED ORDER — LISINOPRIL 10 MG PO TABS
20.0000 mg | ORAL_TABLET | Freq: Two times a day (BID) | ORAL | Status: DC
  Administered 2016-02-10 – 2016-02-12 (×4): 20 mg via ORAL
  Filled 2016-02-10 (×4): qty 2

## 2016-02-10 MED ORDER — HYDRALAZINE HCL 20 MG/ML IJ SOLN
INTRAMUSCULAR | Status: AC
  Filled 2016-02-10: qty 1

## 2016-02-10 MED ORDER — IOPAMIDOL (ISOVUE-370) INJECTION 76%
INTRAVENOUS | Status: DC | PRN
  Administered 2016-02-10: 80 mL via INTRA_ARTERIAL

## 2016-02-10 MED ORDER — SODIUM CHLORIDE 0.9 % IR SOLN
80.0000 mg | Status: DC
  Filled 2016-02-10: qty 2

## 2016-02-10 MED ORDER — AMLODIPINE BESYLATE 5 MG PO TABS
5.0000 mg | ORAL_TABLET | Freq: Every day | ORAL | Status: DC
  Administered 2016-02-11 – 2016-02-12 (×2): 5 mg via ORAL
  Filled 2016-02-10 (×2): qty 1

## 2016-02-10 MED ORDER — CEFAZOLIN SODIUM-DEXTROSE 2-4 GM/100ML-% IV SOLN
2.0000 g | INTRAVENOUS | Status: AC
  Administered 2016-02-10: 2 g via INTRAVENOUS
  Filled 2016-02-10: qty 100

## 2016-02-10 MED ORDER — SODIUM CHLORIDE 0.9 % IR SOLN: 80.0000 mg | Status: DC

## 2016-02-10 MED ORDER — CEFAZOLIN SODIUM-DEXTROSE 2-4 GM/100ML-% IV SOLN: 2.0000 g | INTRAVENOUS | Status: DC

## 2016-02-10 MED ORDER — SODIUM CHLORIDE 0.9 % IV SOLN
INTRAVENOUS | Status: DC
  Administered 2016-02-10: 500 mL via INTRAVENOUS
  Administered 2016-02-10: 15:00:00 via INTRAVENOUS

## 2016-02-10 MED ORDER — SODIUM CHLORIDE 0.9 % IV SOLN: 250.0000 mL | INTRAVENOUS | Status: DC | PRN

## 2016-02-10 MED ORDER — CARVEDILOL 6.25 MG PO TABS
6.2500 mg | ORAL_TABLET | Freq: Two times a day (BID) | ORAL | Status: DC
  Administered 2016-02-10 – 2016-02-11 (×3): 6.25 mg via ORAL
  Filled 2016-02-10 (×3): qty 1

## 2016-02-10 MED ORDER — MIDAZOLAM HCL 2 MG/2ML IJ SOLN
INTRAMUSCULAR | Status: AC
Start: 2016-02-10 — End: 2016-02-10
  Filled 2016-02-10: qty 2

## 2016-02-10 MED ORDER — METHYLPREDNISOLONE SODIUM SUCC 125 MG IJ SOLR
INTRAMUSCULAR | Status: AC
  Filled 2016-02-10: qty 2

## 2016-02-10 SURGICAL SUPPLY — 10 items
CATH INFINITI 5FR MULTPACK ANG (CATHETERS) ×3 IMPLANT
CATH SWAN GANZ 7F STRAIGHT (CATHETERS) ×3 IMPLANT
KIT HEART LEFT (KITS) ×3 IMPLANT
PACK CARDIAC CATHETERIZATION (CUSTOM PROCEDURE TRAY) ×3 IMPLANT
SHEATH PINNACLE 5F 10CM (SHEATH) ×3 IMPLANT
SHEATH PINNACLE 7F 10CM (SHEATH) ×3 IMPLANT
SYR MEDRAD MARK V 150ML (SYRINGE) ×3 IMPLANT
TRANSDUCER W/STOPCOCK (MISCELLANEOUS) ×3 IMPLANT
TUBING CIL FLEX 10 FLL-RA (TUBING) ×3 IMPLANT
WIRE EMERALD 3MM-J .035X150CM (WIRE) ×3 IMPLANT

## 2016-02-10 SURGICAL SUPPLY — 7 items
CABLE SURGICAL S-101-97-12 (CABLE) ×2 IMPLANT
ICD VISIA MRI VR DVFB1D4 (ICD Generator) ×1 IMPLANT
LEAD SPRINT QUAT SEC 6935M-55 (Lead) ×2 IMPLANT
PAD DEFIB LIFELINK (PAD) ×2 IMPLANT
SHEATH CLASSIC 9F (SHEATH) ×2 IMPLANT
TRAY PACEMAKER INSERTION (PACKS) ×2 IMPLANT
VISIA MRI VR DVFB1D4 (ICD Generator) ×2 IMPLANT

## 2016-02-10 NOTE — Progress Notes (Addendum)
SUBJECTIVE: The patient is doing well today.  At this time, she denies chest pain, she continues to have some intermittent SOB, No new concerns.  Had cardiac cath which showed occluded RCA.    Derrill Memo ON 02/11/2016] amLODipine  5 mg Oral Daily  . aspirin EC  81 mg Oral Daily  . atorvastatin  20 mg Oral q1800  . carvedilol  6.25 mg Oral BID WC  . enoxaparin (LOVENOX) injection  40 mg Subcutaneous Q24H  . furosemide  40 mg Oral BID  . lisinopril  20 mg Oral BID  . [START ON 02/11/2016] potassium chloride  20 mEq Oral Daily  . potassium chloride  40 mEq Oral Once  . [START ON 02/16/2016] predniSONE  60 mg Oral QAC breakfast  . sodium chloride flush  3 mL Intravenous Q12H   . sodium chloride 10 mL/hr at 02/07/16 1633  . sodium chloride 1 mL/kg/hr (02/10/16 1155)    OBJECTIVE: Physical Exam: Filed Vitals:   02/10/16 1135 02/10/16 1156 02/10/16 1204 02/10/16 1355  BP: 202/83 169/93 189/81 195/96  Pulse: 66 72  74  Temp:      TempSrc:      Resp: 17 16  17   Weight:      SpO2: 100% 95%  95%    Intake/Output Summary (Last 24 hours) at 02/10/16 1414 Last data filed at 02/10/16 1345  Gross per 24 hour  Intake      0 ml  Output   1500 ml  Net  -1500 ml    Telemetry reveals sinus rhythm  GEN- The patient is well appearing, alert and oriented x 3 today.   Head- normocephalic, atraumatic Eyes-  Sclera clear, conjunctiva pink Ears- hearing intact Oropharynx- clear Neck- supple, no JVP Lungs- soft crackles b/l bases, L>R, normal work of breathing Heart- Regular rate and rhythm, no significant murmurs, no rubs or gallops GI- soft, NT, ND Extremities- no clubbing, cyanosis, or edema Skin- no rash or lesion Psych- euthymic mood, full affect Neuro- no gross deficits appreciated  LABS: Basic Metabolic Panel:  Recent Labs  02/07/16 1728  02/09/16 1242 02/10/16 0320 02/10/16 1325  NA  --   --  142 143  --   K  --   --  3.7 3.3*  --   CL  --   --  111 111  --   CO2  --    --  22 24  --   GLUCOSE  --   --  153* 123*  --   BUN  --   --  12 12  --   CREATININE  --   < > 0.86 0.76 0.82  CALCIUM  --   --  8.9 8.8*  --   MG 1.9  --   --   --   --   < > = values in this interval not displayed. CBC:  Recent Labs  02/10/16 1325  WBC 4.8  HGB 11.9*  HCT 36.2  MCV 86.0  PLT 265   Cardiac Enzymes:  Recent Labs  02/07/16 1728  TROPONINI 0.07*   BNP: Invalid input(s): POCBNP D-Dimer: No results for input(s): DDIMER in the last 72 hours. Hemoglobin A1C:  Recent Labs  02/07/16 1728  HGBA1C 7.2*   Thyroid Function Tests:  Recent Labs  02/07/16 1727  TSH 0.184*    RADIOLOGY: Nm Pulmonary Perf And Vent 02/08/2016  CLINICAL DATA:  Shortness of breath and elevated D-dimer EXAM: NUCLEAR MEDICINE VENTILATION - PERFUSION LUNG  SCAN TECHNIQUE: Ventilation images were obtained in multiple projections using inhaled aerosol Tc-87mDTPA. Perfusion images were obtained in multiple projections after intravenous injection of Tc-91mAA. RADIOPHARMACEUTICALS:  31 MCi Technetium-9910mPA aerosol inhalation and 4.2 mCi Technetium-41m88m IV COMPARISON:  None. FINDINGS: Ventilation: Clumping of the radiopharmaceutical is identified within the central airways. There are several small ventilation defects are identified within the upper lobes bilaterally. Perfusion: No wedge shaped peripheral perfusion defects to suggest acute pulmonary embolism. IMPRESSION: Low probability for acute pulmonary embolus. Electronically Signed   By: TaylKerby Moors.   On: 02/08/2016 11:18   02/08/16 Echocardiogram Study Conclusions - Left ventricle: The cavity size was moderately to severely  dilated. There was mild concentric hypertrophy. Systolic function  was mildly to moderately reduced. The estimated ejection fraction  was in the range of 40% to 45%. Mild hypokinesis of the inferior  and inferoseptal myocardium. Features are consistent with a  pseudonormal left ventricular  filling pattern, with concomitant  abnormal relaxation and increased filling pressure (grade 2  diastolic dysfunction). Doppler parameters are consistent with  elevated mean left atrial filling pressure. - Aortic valve: There was no stenosis. There was no regurgitation. - Mitral valve: Mobility of the anterior and posterior leaflet was  restricted. There was mild regurgitation. Effective regurgitant  orifice (PISA): 0.12 cm^2. - Left atrium: The atrium was severely dilated. - Right ventricle: The cavity size was normal. Wall thickness was  normal. Systolic function was normal. - Pulmonary arteries: Systolic pressure was mildly increased. PA  peak pressure: 44 mm Hg (S). - Inferior vena cava: The vessel was dilated. The respirophasic  diameter changes were in the normal range (>= 50%), consistent  with elevated central venous pressure. - Pericardium, extracardiac: There was a left pleural effusion.    ASSESSMENT AND PLAN:   1. WCT is VT     She has not had any further     On Metoprolol     Cardiac cath showed occluded RCA.  With heart failure and VT with occluded RCA, would qualify for ICD placement.  Discussed risks and benefits of the procedure.  Risks include bleeding, infection, tamponade, and pneumothorax.  Patient understands the risks and has agreed to the procedure.    2. HTN: on coreg and lisinopril  3. CHF     Fluid negative - 4000 Continue with primary care team    Huriel Matt CamnCurt Bears  02/10/2016 2:14 PM

## 2016-02-10 NOTE — H&P (View-Only) (Signed)
Patient Name: Colleen Lowery Date of Encounter: 02/09/2016    SUBJECTIVE: Cough and still feels there is chest congestion. EF 40%. EP has concern that WCT was VT.  TELEMETRY:  NSR Filed Vitals:   02/08/16 1938 02/08/16 2156 02/09/16 0300 02/09/16 0946  BP: 201/95 163/95 153/82 172/82  Pulse: 84  75 79  Temp: 97.6 F (36.4 C)  97.8 F (36.6 C)   TempSrc: Oral  Oral   Resp: 19  19   Weight:   159 lb 8 oz (72.349 kg)   SpO2: 100%  98%     Intake/Output Summary (Last 24 hours) at 02/09/16 1117 Last data filed at 02/09/16 1104  Gross per 24 hour  Intake    480 ml  Output   2850 ml  Net  -2370 ml   LABS: Basic Metabolic Panel:  Recent Labs  02/07/16 1250 02/07/16 1300 02/07/16 1303 02/07/16 1728  NA 133*  --  136  --   K 3.8  --  3.7  --   CL 106  --  105  --   CO2 14*  --   --   --   GLUCOSE 254*  --  246*  --   BUN 9  --  7  --   CREATININE 0.76  --  0.60  --   CALCIUM 9.4  --   --   --   MG  --  1.9  --  1.9   CBC:  Recent Labs  02/07/16 1250 02/07/16 1303  WBC 6.6  --   HGB 13.3 14.6  HCT 38.3 43.0  MCV 84.7  --   PLT 355  --    Cardiac Enzymes:  Recent Labs  02/07/16 1728  TROPONINI 0.07*   BNP: Invalid input(s): POCBNP Hemoglobin A1C:  Recent Labs  02/07/16 1728  HGBA1C 7.2*   V/Q Lung Scan: Low probability  ECHOCARDIOGRAM: 02/08/16 ------------------------------------------------------------------- Study Conclusions  - Left ventricle: The cavity size was moderately to severely  dilated. There was mild concentric hypertrophy. Systolic function  was mildly to moderately reduced. The estimated ejection fraction  was in the range of 40% to 45%. Mild hypokinesis of the inferior  and inferoseptal myocardium. Features are consistent with a  pseudonormal left ventricular filling pattern, with concomitant  abnormal relaxation and increased filling pressure (grade 2  diastolic dysfunction). Doppler parameters are  consistent with  elevated mean left atrial filling pressure. - Aortic valve: There was no stenosis. There was no regurgitation. - Mitral valve: Mobility of the anterior and posterior leaflet was  restricted. There was mild regurgitation. Effective regurgitant  orifice (PISA): 0.12 cm^2. - Left atrium: The atrium was severely dilated. - Right ventricle: The cavity size was normal. Wall thickness was  normal. Systolic function was normal. - Pulmonary arteries: Systolic pressure was mildly increased. PA  peak pressure: 44 mm Hg (S). - Inferior vena cava: The vessel was dilated. The respirophasic  diameter changes were in the normal range (>= 50%), consistent  with elevated central venous pressure. - Pericardium, extracardiac: There    Radiology/Studies:  No new data  Physical Exam: Blood pressure 172/82, pulse 79, temperature 97.8 F (36.6 C), temperature source Oral, resp. rate 19, weight 159 lb 8 oz (72.349 kg), SpO2 98 %. Weight change: -3 lb (-1.361 kg)  Wt Readings from Last 3 Encounters:  02/09/16 159 lb 8 oz (72.349 kg)  11/03/14 153 lb 12.8 oz (69.763 kg)  09/01/14 151 lb (68.493  kg)   Moderate JVD. S4.  Expiratory wheezes and rhonchi  ASSESSMENT:  1. Acute systolic and diastolic heart failure 2. WCT VT vs SVT with aberration 3. Severe systemic hypertension  Plan:  1. BMET 2. Left and right heart cath in AM 3. Contrast allergy prophylaxis 4. EP f/u 5. The patient was counseled to undergo left heart catheterization, coronary angiography, and possible percutaneous coronary intervention with stent implantation. The procedural risks and benefits were discussed in detail. The risks discussed included death, stroke, myocardial infarction, life-threatening bleeding, limb ischemia, kidney injury, allergy, and possible emergency cardiac surgery. The risk of these significant complications were estimated to occur less than 1% of the time. After discussion, the patient  has agreed to proceed.  Demetrios Isaacs 02/09/2016, 11:17 AM

## 2016-02-10 NOTE — Progress Notes (Signed)
Patient handled clear liquid diet well without and nausea or vomiting, upgraded diet to heart healthy carb mod diet. Cyndia Bent

## 2016-02-10 NOTE — Progress Notes (Signed)
Site area: right groin a 5 french arterial sheath was removed  Site Prior to Removal:  Level 0  Pressure Applied For 15 MINUTES    Beginning bedrest at 1135am Manual:   Yes.    Patient Status During Pull:  stable  Post Pull Groin Site:  Level 0  Post Pull Instructions Given:  Yes.    Post Pull Pulses Present:  Yes.    Dressing Applied:  Yes.    Comments:  BP elevated Labetalol 20 mg IV given

## 2016-02-10 NOTE — Progress Notes (Signed)
       Patient Name: Colleen Lowery Date of Encounter: 02/10/2016    SUBJECTIVE:Breaqthing better.  TELEMETRY:  NSR Filed Vitals:   02/10/16 1120 02/10/16 1125 02/10/16 1130 02/10/16 1135  BP: 181/78 191/91 191/88 202/83  Pulse: 66 71 69 66  Temp:      TempSrc:      Resp: 18 21 19 17   Weight:      SpO2: 99% 100% 98% 100%    Intake/Output Summary (Last 24 hours) at 02/10/16 1153 Last data filed at 02/10/16 0338  Gross per 24 hour  Intake    240 ml  Output   1600 ml  Net  -1360 ml   LABS: Basic Metabolic Panel:  Recent Labs  02/07/16 1300  02/07/16 1728 02/09/16 1242 02/10/16 0320  NA  --   < >  --  142 143  K  --   < >  --  3.7 3.3*  CL  --   < >  --  111 111  CO2  --   --   --  22 24  GLUCOSE  --   < >  --  153* 123*  BUN  --   < >  --  12 12  CREATININE  --   < >  --  0.86 0.76  CALCIUM  --   --   --  8.9 8.8*  MG 1.9  --  1.9  --   --   < > = values in this interval not displayed. CBC:  Recent Labs  02/07/16 1250 02/07/16 1303  WBC 6.6  --   HGB 13.3 14.6  HCT 38.3 43.0  MCV 84.7  --   PLT 355  --    Cardiac Enzymes:  Recent Labs  02/07/16 1728  TROPONINI 0.07*   BNP: Invalid input(s): POCBNP Hemoglobin A1C:  Recent Labs  02/07/16 1728  HGBA1C 7.2*   CATH: 02/10/16 Conclusion    1. 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 2. There is moderate left ventricular systolic dysfunction. Basal to mid inferior hypokinesis/akinesis 3. 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 4. Large PCWP V wave suggestive of potential ischemic MR 5. Severe systemic hypertension        Radiology/Studies:  No new data  Physical Exam: Blood pressure 202/83, pulse 66, temperature 97.9 F (36.6 C), temperature source Oral, resp. rate 17, weight 160 lb 4.8 oz (72.712 kg), SpO2 100 %. Weight change: 12.8 oz (0.363 kg)  Wt Readings from Last 3 Encounters:  02/10/16  160 lb 4.8 oz (72.712 kg)  11/03/14 153 lb 12.8 oz (69.763 kg)  09/01/14 151 lb (68.493 kg)   Chest with wheezes but improved. No cath site bleeding  ASSESSMENT: 1. 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 3. 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 4. Very poorly controlled BP 5. Agree recurrent syncope could have been arrhythmia related.  Plan:  1. Add ACE or ARB 2. EP follow-up 3. Optimize heart failure therapy 4. Track kidney function  Signed, Sinclair Grooms 02/10/2016, 11:53 AM

## 2016-02-10 NOTE — Interval H&P Note (Signed)
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   The various methods of treatment have been discussed with the patient and family. After consideration of risks, benefits and other options for treatment, the patient has consented to  Procedure(s): Left Heart Cath and Coronary Angiography (N/A) - with possible Percutaneous Coronary Intervention if indicated as a surgical intervention .  The patient's history has been reviewed, patient examined, no change in status, stable for surgery.  I have reviewed the patient's chart and labs.  Questions were answered to the patient's satisfaction.    AUC FOR DIAGNOSTIC CATH -- will consider AUC for PCI pending diagnostic findings Cardiomyopathies (Right and Left Heart Catheterization OR  Right Heart Catheterization Alone With/Without Left Ventriculography and Coronary Angiography)   Patient Information:    Known or suspected cardiomyopathy with or without heart failure  AUC Score:   A (7)   Indication:   93   [name]

## 2016-02-11 ENCOUNTER — Inpatient Hospital Stay (HOSPITAL_COMMUNITY): Payer: Medicare HMO

## 2016-02-11 ENCOUNTER — Encounter (HOSPITAL_COMMUNITY): Payer: Self-pay | Admitting: Cardiology

## 2016-02-11 LAB — BASIC METABOLIC PANEL
Anion gap: 12 (ref 5–15)
BUN: 13 mg/dL (ref 6–20)
CO2: 23 mmol/L (ref 22–32)
Calcium: 9.4 mg/dL (ref 8.9–10.3)
Chloride: 107 mmol/L (ref 101–111)
Creatinine, Ser: 0.86 mg/dL (ref 0.44–1.00)
GFR calc Af Amer: >60 mL/min (ref >60)
GFR calc non Af Amer: >60 mL/min (ref >60)
Glucose, Bld: 146 mg/dL — ABNORMAL HIGH (ref 65–99)
Potassium: 3.6 mmol/L (ref 3.5–5.1)
Sodium: 142 mmol/L (ref 135–145)

## 2016-02-11 MED ORDER — HYDRALAZINE HCL 20 MG/ML IJ SOLN
10.0000 mg | Freq: Four times a day (QID) | INTRAMUSCULAR | Status: DC | PRN
  Administered 2016-02-11: 10 mg via INTRAVENOUS
  Filled 2016-02-11: qty 1

## 2016-02-11 MED ORDER — CARVEDILOL 12.5 MG PO TABS
12.5000 mg | ORAL_TABLET | Freq: Two times a day (BID) | ORAL | Status: DC
  Administered 2016-02-12: 12.5 mg via ORAL
  Filled 2016-02-11: qty 1

## 2016-02-11 MED ORDER — FUROSEMIDE 80 MG PO TABS
80.0000 mg | ORAL_TABLET | Freq: Every day | ORAL | Status: DC
  Administered 2016-02-12: 80 mg via ORAL
  Filled 2016-02-11: qty 1

## 2016-02-11 MED ORDER — FUROSEMIDE 40 MG PO TABS
40.0000 mg | ORAL_TABLET | Freq: Two times a day (BID) | ORAL | Status: AC
  Administered 2016-02-11: 40 mg via ORAL
  Filled 2016-02-11: qty 1

## 2016-02-11 MED FILL — Heparin Sodium (Porcine) 2 Unit/ML in Sodium Chloride 0.9%: INTRAMUSCULAR | Qty: 500 | Status: AC

## 2016-02-11 NOTE — Progress Notes (Signed)
SUBJECTIVE: The patient is doing well today.  At this time, she states she is really feeling well, denies chest pain, shortness of breath, or any new concerns.  [name] amLODipine  5 mg Oral Daily  . aspirin EC  81 mg Oral Daily  . atorvastatin  20 mg Oral q1800  . carvedilol  6.25 mg Oral BID WC  .  ceFAZolin (ANCEF) IV  1 g Intravenous Q6H  . enoxaparin (LOVENOX) injection  40 mg Subcutaneous Q24H  . furosemide  40 mg Oral BID  . gentamicin irrigation   Irrigation To SS-Surg  . lisinopril  20 mg Oral BID  . potassium chloride  20 mEq Oral Daily  . potassium chloride  40 mEq Oral Once  . [START ON 02/16/2016] predniSONE  60 mg Oral QAC breakfast  . sodium chloride flush  3 mL Intravenous Q12H   . sodium chloride 10 mL/hr at 02/07/16 1633    OBJECTIVE: Physical Exam: Filed Vitals:   02/10/16 2133 02/10/16 2222 02/11/16 0423 02/11/16 0429  BP: 174/83 163/89 182/95 180/81  Pulse:   81   Temp:   99.1 F (37.3 C)   TempSrc:   Oral   Resp:   22   Weight:      SpO2:   99%     Intake/Output Summary (Last 24 hours) at 02/11/16 7829 Last data filed at 02/11/16 0430  Gross per 24 hour  Intake    770 ml  Output   3300 ml  Net  -2530 ml    Telemetry reveals sinus rhythm  GEN- The patient is well appearing, alert and oriented x 3 today.   Head- normocephalic, atraumatic Eyes-  Sclera clear, conjunctiva pink Ears- hearing intact Oropharynx- clear Neck- supple, no JVP Lungs- Clear to ausculation bilaterally, normal work of breathing Heart- Regular rate and rhythm GI- soft, NT, ND Extremities- no clubbing, cyanosis, or edema Skin- no rash or lesion Psych- euthymic mood, full affect Neuro- no gross deficits appreciated  ICD site is stable, no hematoma  LABS: Basic Metabolic Panel:  Recent Labs  02/09/16 1242 02/10/16 0320 02/10/16 1325  NA 142 143  --   K 3.7 3.3*  --   CL 111 111  --   CO2 22 24  --   GLUCOSE 153* 123*  --   BUN 12 12  --   CREATININE 0.86 0.76  0.82  CALCIUM 8.9 8.8*  --    CBC:  Recent Labs  02/10/16 1325  WBC 4.8  HGB 11.9*  HCT 36.2  MCV 86.0  PLT 265   Dg Chest 2 View 02/11/2016  CLINICAL DATA:  Status post permanent pacemaker placement and cardiac catheterization. EXAM: CHEST  2 VIEW COMPARISON:  Portable chest x-ray of February 07, 2016 FINDINGS: The lungs are adequately inflated. There is left lower lobe atelectasis or pneumonia with small left pleural effusion. There is thickening of the minor fissure on the right. There is a trace right pleural effusion blunting the costophrenic angles. The cardiac silhouette is enlarged. The pulmonary vascularity is prominent centrally. The permanent pacemaker defibrillator is in reasonable position radiographically. The bony structures are unremarkable. IMPRESSION: Left lower lobe atelectasis or pneumonia. Small bilateral pleural effusions. Low-grade compensated CHF. Electronically Signed   By: David  Martinique M.D.   On: 02/11/2016 07:55     ASSESSMENT AND PLAN:  1. WCT is VT  She has not had any further  On Metoprolol  Cardiac cath showed occluded RCA. With heart failure  and VT with occluded RCA, would qualify for ICD placement. S/p ICD 02/10/16 with Dr. Curt Bears site is stable, patient denies any discomfort Device check with normal function this morning CXR today without pneumothorax Wound check and f/u visit with Dr. Curt Bears have been scheduled   2. HTN: on coreg and lisinopril  3. CHF  Fluid negative - 6020 Continue care with primary cardiology team   EP service remains available if needed  Tommye Standard, PA-C 02/11/2016 8:28 AM   I have seen and examined this patient with Tommye Standard.  Agree with above, note added to reflect my findings.  On exam, regular rhythm, no murmurs, lungs clear.  Had VT with an occluded RCA and a low EF.  Not revascularized currently.  Had ICD placed yesterday without issues.  Charlize Hathaway set her up for device check in 10 days in clinic.  CXR  without pneumothorax, device functioning normally on interrogation.    Dunia Pringle M. Shakinah Navis MD 02/11/2016 11:14 AM

## 2016-02-11 NOTE — Care Management Note (Signed)
Case Management Note Marvetta Gibbons RN, BSN Unit 2W-Case Manager [phone]  Patient Details  Name: Colleen Lowery MRN: [id number] Date of Birth: 11/08/1941  Subjective/Objective:   Pt admitted with SVT s/p ICD                 Action/Plan: PTA pt lived at home, CM to follow for d/c needs  Expected Discharge Date:   (unknown)               Expected Discharge Plan:  Home/Self Care  In-House Referral:     Discharge planning Services  CM Consult  Post Acute Care Choice:    Choice offered to:     DME Arranged:    DME Agency:     HH Arranged:    HH Agency:     Status of Service:  In process, will continue to follow  Medicare Important Message Given:  Yes Date Medicare IM Given:    Medicare IM give by:    Date Additional Medicare IM Given:    Additional Medicare Important Message give by:     If discussed at [facility] of Stay Meetings, dates discussed:    Additional Comments:  Dawayne Patricia, RN 02/11/2016, 2:06 PM

## 2016-02-11 NOTE — Progress Notes (Addendum)
       Patient Name: Colleen Lowery Date of Encounter: 02/11/2016    SUBJECTIVE: Feels better. Sore at AICD implant site. Denies dyspnea. Cough is decreasing.  TELEMETRY:  Intermittent pacing. No VT. Filed Vitals:   02/10/16 2222 02/11/16 0423 02/11/16 0429 02/11/16 1046  BP: 163/89 182/95 180/81 166/79  Pulse:  81    Temp:  99.1 F (37.3 C)    TempSrc:  Oral    Resp:  22    Weight:      SpO2:  99%  99%    Intake/Output Summary (Last 24 hours) at 02/11/16 1136 Last data filed at 02/11/16 0430  Gross per 24 hour  Intake    770 ml  Output   3300 ml  Net  -2530 ml   Intake/output  LABS: Basic Metabolic Panel:  Recent Labs  02/09/16 1242 02/10/16 0320 02/10/16 1325  NA 142 143  --   K 3.7 3.3*  --   CL 111 111  --   CO2 22 24  --   GLUCOSE 153* 123*  --   BUN 12 12  --   CREATININE 0.86 0.76 0.82  CALCIUM 8.9 8.8*  --    CBC:  Recent Labs  02/10/16 1325  WBC 4.8  HGB 11.9*  HCT 36.2  MCV 86.0  PLT 265   Radiology/Studies:  CXR 02/11/16 IMPRESSION: Left lower lobe atelectasis or pneumonia. Small bilateral pleural effusions. Low-grade compensated CHF.   Physical Exam: Blood pressure 166/79, pulse 81, temperature 99.1 F (37.3 C), temperature source Oral, resp. rate 22, weight 160 lb 4.8 oz (72.712 kg), SpO2 99 %. Weight change:   Wt Readings from Last 3 Encounters:  02/10/16 160 lb 4.8 oz (72.712 kg)  11/03/14 153 lb 12.8 oz (69.763 kg)  09/01/14 151 lb (68.493 kg)   Device site without evidence of significant hematoma S4 gallop is audible No peripheral edema  ASSESSMENT: 1. 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 2. Chronic combined systolic and diastolic heart failure, with improved volume status with diuresis. 3. History of recurrent syncope versus seizures. Ventricular arrhythmia seems more likely in retrospect. 4. Severe hypertension requiring control prior to discharge  Plan:  1. Change  furosemide to 80 mg once daily 2. Supplement potassium 3. Probable discharge in a.m. if blood pressure is controlled 4. Today's metabolic panel is pending.  Demetrios Isaacs 02/11/2016, 11:36 AM

## 2016-02-11 NOTE — Care Management Important Message (Signed)
Important Message  Patient Details  Name: Colleen Lowery MRN: [id number] Date of Birth: 07/02/1941   Medicare Important Message Given:  Yes    Nathen May 02/11/2016, 11:29 AM

## 2016-02-12 LAB — BASIC METABOLIC PANEL
Anion gap: 12 (ref 5–15)
BUN: 12 mg/dL (ref 6–20)
CO2: 26 mmol/L (ref 22–32)
Calcium: 9.4 mg/dL (ref 8.9–10.3)
Chloride: 104 mmol/L (ref 101–111)
Creatinine, Ser: 0.79 mg/dL (ref 0.44–1.00)
GFR calc Af Amer: >60 mL/min (ref >60)
GFR calc non Af Amer: >60 mL/min (ref >60)
Glucose, Bld: 171 mg/dL — ABNORMAL HIGH (ref 65–99)
Potassium: 3 mmol/L — ABNORMAL LOW (ref 3.5–5.1)
Sodium: 142 mmol/L (ref 135–145)

## 2016-02-12 MED ORDER — ATORVASTATIN CALCIUM 20 MG PO TABS: 20.0000 mg | ORAL_TABLET | Freq: Every day | ORAL | Status: DC

## 2016-02-12 MED ORDER — AMLODIPINE BESYLATE 5 MG PO TABS: 5.0000 mg | ORAL_TABLET | Freq: Every day | ORAL | Status: DC

## 2016-02-12 MED ORDER — POTASSIUM CHLORIDE CRYS ER 20 MEQ PO TBCR: 20.0000 meq | EXTENDED_RELEASE_TABLET | Freq: Every day | ORAL | Status: DC

## 2016-02-12 MED ORDER — ASPIRIN 81 MG PO TBEC: 81.0000 mg | DELAYED_RELEASE_TABLET | Freq: Every day | ORAL | Status: DC

## 2016-02-12 MED ORDER — NITROGLYCERIN 0.4 MG SL SUBL: 0.4000 mg | SUBLINGUAL_TABLET | SUBLINGUAL | Status: DC | PRN

## 2016-02-12 MED ORDER — LISINOPRIL 20 MG PO TABS: 20.0000 mg | ORAL_TABLET | Freq: Two times a day (BID) | ORAL | Status: DC

## 2016-02-12 MED ORDER — FUROSEMIDE 80 MG PO TABS: 80.0000 mg | ORAL_TABLET | Freq: Every day | ORAL | Status: DC

## 2016-02-12 MED ORDER — CARVEDILOL 12.5 MG PO TABS: 12.5000 mg | ORAL_TABLET | Freq: Two times a day (BID) | ORAL | Status: DC

## 2016-02-12 MED ORDER — POTASSIUM CHLORIDE CRYS ER 20 MEQ PO TBCR
80.0000 meq | EXTENDED_RELEASE_TABLET | Freq: Once | ORAL | Status: AC
  Administered 2016-02-12: 80 meq via ORAL
  Filled 2016-02-12: qty 4

## 2016-02-12 NOTE — Discharge Instructions (Signed)
° ° °  Supplemental Discharge Instructions for  Pacemaker/Defibrillator Patients  Activity No heavy lifting or vigorous activity with your left/right arm for 6 to 8 weeks.  Do not raise your left/right arm above your head for one week.  Gradually raise your affected arm as drawn below.          April 4                      April 5                    April 6                     April 7   NO DRIVING for  1 week   ; you may begin driving on  April 7   .  WOUND CARE - Keep the wound area clean and dry.  Do not get this area wet for one week. No showers for one week; you may shower on April 7    . - The tape/steri-strips on your wound will fall off; do not pull them off.  No bandage is needed on the site.  DO  NOT apply any creams, oils, or ointments to the wound area. - If you notice any drainage or discharge from the wound, any swelling or bruising at the site, or you develop a fever > 101? F after you are discharged home, call the office at once.  Special Instructions - You are still able to use cellular telephones; use the ear opposite the side where you have your pacemaker/defibrillator.  Avoid carrying your cellular phone near your device. - When traveling through airports, show security personnel your identification card to avoid being screened in the metal detectors.  Ask the security personnel to use the hand wand. - Avoid arc welding equipment, MRI testing (magnetic resonance imaging), TENS units (transcutaneous nerve stimulators).  Call the office for questions about other devices. - Avoid electrical appliances that are in poor condition or are not properly grounded. - Microwave ovens are safe to be near or to operate.  Additional information for defibrillator patients should your device go off: - If your device goes off ONCE and you feel fine afterward, notify the device clinic nurses. - If your device goes off ONCE and you do not feel well afterward, call 911. - If your device goes  off TWICE, call 911. - If your device goes off THREE times in one day, call 911.  DO NOT DRIVE YOURSELF OR A FAMILY MEMBER WITH A DEFIBRILLATOR TO THE HOSPITAL--CALL 911.

## 2016-02-12 NOTE — Progress Notes (Signed)
    SUBJECTIVE:  SOB .  However, she blames this on allergens and smells in the hospital.  She says that she knows that she would do much better at home.     PHYSICAL EXAM Filed Vitals:   02/11/16 2257 02/11/16 2310 02/11/16 2322 02/12/16 0350  BP: 180/87 168/76 173/77 169/89  Pulse: 74 74  85  Temp:    98 F (36.7 C)  TempSrc:    Oral  Resp:    20  Weight:    151 lb 14.4 oz (68.901 kg)  SpO2:    98%   General:  No distress Lungs:  No crackles Heart:  RRR Abdomen:  Positive bowel sounds, no rebound no guarding Extremities:  No edema Chest:  ICD site OK.    LABS:  Results for orders placed or performed during the hospital encounter of 02/07/16 (from the past 24 hour(s))  Basic metabolic panel     Status: Abnormal   Collection Time: 02/11/16 12:24 PM  Result Value Ref Range   Sodium 142 135 - 145 mmol/L   Potassium 3.6 3.5 - 5.1 mmol/L   Chloride 107 101 - 111 mmol/L   CO2 23 22 - 32 mmol/L   Glucose, Bld 146 (H) 65 - 99 mg/dL   BUN 13 6 - 20 mg/dL   Creatinine, Ser 0.86 0.44 - 1.00 mg/dL   Calcium 9.4 8.9 - 10.3 mg/dL   GFR calc non Af Amer >60 >60 mL/min   GFR calc Af Amer >60 >60 mL/min   Anion gap 12 5 - 15  Basic metabolic panel     Status: Abnormal   Collection Time: 02/12/16  3:45 AM  Result Value Ref Range   Sodium 142 135 - 145 mmol/L   Potassium 3.0 (L) 3.5 - 5.1 mmol/L   Chloride 104 101 - 111 mmol/L   CO2 26 22 - 32 mmol/L   Glucose, Bld 171 (H) 65 - 99 mg/dL   BUN 12 6 - 20 mg/dL   Creatinine, Ser 0.79 0.44 - 1.00 mg/dL   Calcium 9.4 8.9 - 10.3 mg/dL   GFR calc non Af Amer >60 >60 mL/min   GFR calc Af Amer >60 >60 mL/min   Anion gap 12 5 - 15    Intake/Output Summary (Last 24 hours) at 02/12/16 1044 Last data filed at 02/12/16 0825  Gross per 24 hour  Intake    720 ml  Output    900 ml  Net   -180 ml     ASSESSMENT AND PLAN:  AICD: No soreness.  OK to go home.  Needs 40 meq Kdur daily and then needs BMET on Monday.    HTN:  Norvasc  started and BP is better.  Ok to discharge with close follow up of her BP.   She should keep a BP diary.    CHRONIC COMBINED SYSTOLIC AND DIASOTOLIC HF:   Euvolemic.    Jeneen Rinks [facility] 02/12/2016 10:44 AM

## 2016-02-12 NOTE — Discharge Summary (Signed)
Discharge Summary    Patient ID: Colleen Lowery,  MRN: [id number], DOB/AGE: November 03, 1941 75 y.o.  Admit date: 02/07/2016 Discharge date: 02/12/2016  Primary Care Provider: No PCP Per Patient Primary Cardiologist: Tamala Julian  Discharge Diagnoses    Principal Problem:   VT (ventricular tachycardia) ([place]) Active Problems:   Diabetes ([place])   Benign essential HTN   Tobacco abuse   Acute on chronic combined systolic and diastolic HF (heart failure) ([facility])   Contrast media allergy   Allergies Allergies  Allergen Reactions  . Contrast Media [Iodinated Diagnostic Agents] Anaphylaxis    Throat closed, was 40 yrs ago  . Dust Mite Extract Swelling  . Molds & Smuts Swelling    Diagnostic Studies/Procedures    Echocardiogram 01/31/2016  Study Conclusions  - Left ventricle: The cavity size was moderately to severely  dilated. There was mild concentric hypertrophy. Systolic function  was mildly to moderately reduced. The estimated ejection fraction  was in the range of 40% to 45%. Mild hypokinesis of the inferior  and inferoseptal myocardium. Features are consistent with a  pseudonormal left ventricular filling pattern, with concomitant  abnormal relaxation and increased filling pressure (grade 2  diastolic dysfunction). Doppler parameters are consistent with  elevated mean left atrial filling pressure. - Aortic valve: There was no stenosis. There was no regurgitation. - Mitral valve: Mobility of the anterior and posterior leaflet was  restricted. There was mild regurgitation. Effective regurgitant  orifice (PISA): 0.12 cm^2. - Left atrium: The atrium was severely dilated. - Right ventricle: The cavity size was normal. Wall thickness was  normal. Systolic function was normal. - Pulmonary arteries: Systolic pressure was mildly increased. PA  peak pressure: 44 mm Hg (S). - Inferior vena cava: The vessel was dilated. The respirophasic  diameter changes were in the normal  range (>= 50%), consistent  with elevated central venous pressure. - Pericardium, extracardiac: There was a left pleural effusion.  Procedures    Right/Left Heart Cath and Coronary Angiography    Conclusion    1. 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 2. There is moderate left ventricular systolic dysfunction. Basal to mid inferior hypokinesis/akinesis 3. 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 4. Large PCWP V wave suggestive of potential ischemic MR 5. Severe systemic hypertension   The patient has ischemic cardiomyopathy with an occluded RCA and very fine collaterals to the distal RCA branch vessels. I suspect that this is a subacute occlusion dating back to her initial onset discomfort a week ago. The occlusion appears to be consolidated and likely difficult to open at this time. Could consider CTO intervention in the future.  Patient has severe systemic hypertension with elevated LVEDP and wedge pressure needs to be treated. She likely also needs additional diuresis.  Plan:  Return to nursing unit for continued medical management.  I have increased her Lasix to 40 mg twice a day by mouth  Will defer to primary team and electrophysiology catheter inside the benefit for ICD.    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 of HTN, DM-2 and seizures now presents to ER with 2 day hx of SOB. Beginning about 1 week ago she had some chest tightness that has persisted. 2 days ago she developed SOB. She states she has a hx  of "large heart" and anasarca. CXR today heart size is within normal limits. No cardiac cath no Echo in Epic, and none under care everywhere. She does see Neurology at [facility].   In ER HR SVT at 170 with RBBB. Not aware of tachycardia. She has had 2, 6 mg of adenosine and 12 mg  of adenosine without change. Has also had 15 mg Cardizem. Dr. Tamala Julian at bedside. Pt is now receiving 2nd amiodarone bolus. BP is elevated. She stated it was controlled but looking through home chart it has been running 150/117 and 170/114. Her heart rate on 02/01/16 was 80 per her records. She is alert and oriented. No obvious distress except for SOB.   DDimer 3.41  TSH 0.261 Glucose 254 troponin 0.06   With no change in HR pt was notified of need for DCCV and was sedated and cardioverted with 150 watts successfully to SR. No longer with RBBB. Pt waking from DCCV.   Hospital Course     Consultants: Electrophysiology    She had elevated d-dimer and underwent VQ scan scan which was low risk. She was started on a beta blocker.  She was seen by Dr. Lovena Le who recommended heart catheterization since her ejection fraction was 40-45%.  She underwent right and left heart catheterizations revealing severe single-vessel coronary disease with a mid to distal RCA lesion of 100%. There are left to right collaterals. 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 Large PCWP V wave suggestive of potential ischemic MR.  Lasix was increased to 40 mg twice a day by mouth.  She ended up having a negative diuresis of 6.8 L. Lasix was changed to 80 mg once daily.  Because of heart failure, VT an occluded RCA she qualified for ICD placement. This was implanted 02/11/2016 by Dr. Curt Bears. She tolerated the procedure well. CXR showed no pneumothorax on chest x-ray the following day and device check showed normal function. She was started on ACE inhibitor as well as amlodipine 5 mg.  The patient was seen by Dr. Percival Spanish who felt she was stable for DC home.  She was scheduled for wound check in 7 days along with follow-up with an APP.    _____________  Discharge Vitals Blood pressure 169/89, pulse 85, temperature 98 F (36.7 C), temperature source Oral,  resp. rate 20, weight 151 lb 14.4 oz (68.901 kg), SpO2 98 %.  Filed Weights   02/09/16 0300 02/10/16 0332 02/12/16 0350  Weight: 159 lb 8 oz (72.349 kg) 160 lb 4.8 oz (72.712 kg) 151 lb 14.4 oz (68.901 kg)    Labs & Radiologic Studies    CBC  Recent Labs  02/10/16 1325  WBC 4.8  HGB 11.9*  HCT 36.2  MCV 86.0  PLT 99991111   Basic Metabolic Panel  Recent Labs  02/11/16 1224 02/12/16 0345  NA 142 142  K 3.6 3.0*  CL 107 104  CO2 23 26  GLUCOSE 146* 171*  BUN 13 12  CREATININE 0.86 0.79  CALCIUM 9.4 9.4   _____________  Dg Chest 2 View  02/11/2016  CLINICAL DATA:  Status post permanent pacemaker placement and cardiac catheterization. EXAM: CHEST  2 VIEW COMPARISON:  Portable chest x-ray of February 07, 2016 FINDINGS: The lungs are adequately inflated. There is left lower lobe atelectasis or pneumonia with small left pleural effusion. There is thickening of the minor fissure on the right. There is a trace right pleural effusion blunting the costophrenic angles. The  cardiac silhouette is enlarged. The pulmonary vascularity is prominent centrally. The permanent pacemaker defibrillator is in reasonable position radiographically. The bony structures are unremarkable. IMPRESSION: Left lower lobe atelectasis or pneumonia. Small bilateral pleural effusions. Low-grade compensated CHF. Electronically Signed   By: David  Martinique M.D.   On: 02/11/2016 07:55   Nm Pulmonary Perf And Vent  02/08/2016  CLINICAL DATA:  Shortness of breath and elevated D-dimer EXAM: NUCLEAR MEDICINE VENTILATION - PERFUSION LUNG SCAN TECHNIQUE: Ventilation images were obtained in multiple projections using inhaled aerosol Tc-16m DTPA. Perfusion images were obtained in multiple projections after intravenous injection of Tc-15m MAA. RADIOPHARMACEUTICALS:  31 MCi Technetium-70m DTPA aerosol inhalation and 4.2 mCi Technetium-65m MAA IV COMPARISON:  None. FINDINGS: Ventilation: Clumping of the radiopharmaceutical is identified  within the central airways. There are several small ventilation defects are identified within the upper lobes bilaterally. Perfusion: No wedge shaped peripheral perfusion defects to suggest acute pulmonary embolism. IMPRESSION: Low probability for acute pulmonary embolus. Electronically Signed   By: Kerby Moors M.D.   On: 02/08/2016 11:18   Dg Chest Portable 1 View  02/07/2016  CLINICAL DATA:  Tachycardia. EXAM: PORTABLE CHEST 1 VIEW COMPARISON:  None. FINDINGS: The heart size and mediastinal contours are within normal limits. Both lungs are clear. No pneumothorax or pleural effusion is noted. The visualized skeletal structures are unremarkable. IMPRESSION: No acute cardiopulmonary abnormality seen. Electronically Signed   By: Marijo Conception, M.D.   On: 02/07/2016 15:26   Disposition   Pt is being discharged home today in good condition.  Follow-up Plans & Appointments    Follow-up Information    Follow up with Sinclair Grooms, MD.   Specialty:  Cardiology   Why:  Office will call you to schedule your follow-up appointment   Contact information:   [id number] [place] [address] [phone]      Discharge Instructions    Diet - low sodium heart healthy    Complete by:  As directed      Increase activity slowly    Complete by:  As directed            Discharge Medications   Current Discharge Medication List    START taking these medications   Details  amLODipine (NORVASC) 5 MG tablet Take 1 tablet (5 mg total) by mouth daily. Qty: 30 tablet, Refills: 11    aspirin EC 81 MG EC tablet Take 1 tablet (81 mg total) by mouth daily.    atorvastatin (LIPITOR) 20 MG tablet Take 1 tablet (20 mg total) by mouth daily at 6 PM. Qty: 30 tablet, Refills: 11    carvedilol (COREG) 12.5 MG tablet Take 1 tablet (12.5 mg total) by mouth 2 (two) times daily with a meal. Qty: 30 tablet, Refills: 11    furosemide (LASIX) 80 MG tablet Take 1 tablet (80 mg total) by  mouth daily. Qty: 30 tablet, Refills: 11    lisinopril (PRINIVIL,ZESTRIL) 20 MG tablet Take 1 tablet (20 mg total) by mouth 2 (two) times daily. Qty: 30 tablet, Refills: 11    nitroGLYCERIN (NITROSTAT) 0.4 MG SL tablet Place 1 tablet (0.4 mg total) under the tongue every 5 (five) minutes x 3 doses as needed for chest pain. Qty: 25 tablet, Refills: 12    potassium chloride SA (K-DUR,KLOR-CON) 20 MEQ tablet Take 1 tablet (20 mEq total) by mouth daily. Qty: 30 tablet, Refills: 11      CONTINUE these medications which have NOT CHANGED  Details  Chlorpheniramine Maleate (CHLOR-TRIMETON PO) Take 0.25 tablets by mouth daily as needed (itching).    EYEBRIGHT HERB PO Take 2 tablets by mouth daily.    fluticasone (FLONASE SENSIMIST) 27.5 MCG/SPRAY nasal spray Place 2 sprays into the nose daily as needed for rhinitis.    levETIRAcetam (KEPPRA) 500 MG tablet Take 500 mg by mouth at bedtime.    MILK THISTLE PO Take 5 tablets by mouth daily as needed (liver function).    !! OVER THE COUNTER MEDICATION Take 4 tablets by mouth daily. Hypothalamus 500mg s    !! OVER THE COUNTER MEDICATION Take 880 tablets by mouth daily. Horsetail 440mg  tabs    !! OVER THE COUNTER MEDICATION Take 2 tablets by mouth daily. Respiration blend sp-3 natural supplement    !! OVER THE COUNTER MEDICATION Take 10 tablets by mouth daily. Liver rite herbal supplement    !! Specialty Vitamins Products (LYMPH PO) Take 1,000 mg by mouth daily. Lymph beef 250mg     !! Specialty Vitamins Products (PITUITARY) 35 MG TABS Take 2-3 tablets by mouth daily.    Superoxide Dismutase (S.O.D. PO) Take 5-10 tablets by mouth daily as needed (burning).     !! - Potential duplicate medications found. Please discuss with provider.    STOP taking these medications     cephALEXin (KEFLEX) 500 MG capsule      fluconazole (DIFLUCAN) 150 MG tablet            Outstanding Labs/Studies     Duration of Discharge Encounter   Greater  than 30 minutes including physician time.  Patriciaann Clan, [place] PAC 02/12/2016, 11:39 AM

## 2016-02-23 ENCOUNTER — Ambulatory Visit (INDEPENDENT_AMBULATORY_CARE_PROVIDER_SITE_OTHER): Payer: Medicare HMO | Admitting: *Deleted

## 2016-02-23 ENCOUNTER — Encounter (HOSPITAL_COMMUNITY): Payer: Self-pay | Admitting: General Practice

## 2016-02-23 ENCOUNTER — Encounter: Payer: Self-pay | Admitting: Physician Assistant

## 2016-02-23 ENCOUNTER — Ambulatory Visit (INDEPENDENT_AMBULATORY_CARE_PROVIDER_SITE_OTHER): Payer: Medicare HMO | Admitting: Physician Assistant

## 2016-02-23 ENCOUNTER — Encounter: Payer: Self-pay | Admitting: Cardiology

## 2016-02-23 ENCOUNTER — Ambulatory Visit: Payer: Medicare HMO | Admitting: Physician Assistant

## 2016-02-23 ENCOUNTER — Inpatient Hospital Stay (HOSPITAL_COMMUNITY)
Admission: AD | Admit: 2016-02-23 | Discharge: 2016-02-25 | DRG: 309 | Disposition: A | Discharge status: Home / Self Care | Payer: Medicare HMO | Source: Ambulatory Visit | Attending: Cardiology | Admitting: Cardiology

## 2016-02-23 VITALS — BP 117/76 | HR 98 | Resp 20

## 2016-02-23 VITALS — BP 130/90 | HR 98 | Ht 62.25 in | Wt 146.0 lb

## 2016-02-23 DIAGNOSIS — Z79899 Other long term (current) drug therapy: Secondary | ICD-10-CM

## 2016-02-23 DIAGNOSIS — I472 Ventricular tachycardia, unspecified: Secondary | ICD-10-CM

## 2016-02-23 DIAGNOSIS — I110 Hypertensive heart disease with heart failure: Secondary | ICD-10-CM | POA: Diagnosis present

## 2016-02-23 DIAGNOSIS — E119 Type 2 diabetes mellitus without complications: Secondary | ICD-10-CM | POA: Diagnosis present

## 2016-02-23 DIAGNOSIS — Z7982 Long term (current) use of aspirin: Secondary | ICD-10-CM

## 2016-02-23 DIAGNOSIS — Z95810 Presence of automatic (implantable) cardiac defibrillator: Secondary | ICD-10-CM

## 2016-02-23 DIAGNOSIS — I471 Supraventricular tachycardia: Secondary | ICD-10-CM

## 2016-02-23 DIAGNOSIS — R0602 Shortness of breath: Secondary | ICD-10-CM

## 2016-02-23 DIAGNOSIS — I2510 Atherosclerotic heart disease of native coronary artery without angina pectoris: Secondary | ICD-10-CM

## 2016-02-23 DIAGNOSIS — I255 Ischemic cardiomyopathy: Secondary | ICD-10-CM | POA: Diagnosis present

## 2016-02-23 DIAGNOSIS — I5042 Chronic combined systolic (congestive) and diastolic (congestive) heart failure: Secondary | ICD-10-CM | POA: Diagnosis present

## 2016-02-23 DIAGNOSIS — G40909 Epilepsy, unspecified, not intractable, without status epilepticus: Secondary | ICD-10-CM | POA: Diagnosis present

## 2016-02-23 HISTORY — DX: Acute combined systolic (congestive) and diastolic (congestive) heart failure: I50.41

## 2016-02-23 LAB — CBC WITH DIFFERENTIAL/PLATELET
Basophils Absolute: 0 10*3/uL (ref 0.0–0.1)
Basophils Relative: 0 %
Eosinophils Absolute: 0.1 10*3/uL (ref 0.0–0.7)
Eosinophils Relative: 1 %
HCT: 41.6 % (ref 36.0–46.0)
Hemoglobin: 13.8 g/dL (ref 12.0–15.0)
Lymphocytes Relative: 38 %
Lymphs Abs: 3.5 10*3/uL (ref 0.7–4.0)
MCH: 28.5 pg (ref 26.0–34.0)
MCHC: 33.2 g/dL (ref 30.0–36.0)
MCV: 86 fL (ref 78.0–100.0)
Monocytes Absolute: 1 10*3/uL (ref 0.1–1.0)
Monocytes Relative: 10 %
Neutro Abs: 4.7 10*3/uL (ref 1.7–7.7)
Neutrophils Relative %: 51 %
Platelets: 322 10*3/uL (ref 150–400)
RBC: 4.84 MIL/uL (ref 3.87–5.11)
RDW: 14.4 % (ref 11.5–15.5)
WBC: 9.3 10*3/uL (ref 4.0–10.5)

## 2016-02-23 LAB — BASIC METABOLIC PANEL
Anion gap: 12 (ref 5–15)
BUN: 10 mg/dL (ref 7–25)
BUN: 9 mg/dL (ref 6–20)
CO2: 20 mmol/L (ref 20–31)
CO2: 17 mmol/L — ABNORMAL LOW (ref 22–32)
Calcium: 9.7 mg/dL (ref 8.6–10.4)
Calcium: 9.5 mg/dL (ref 8.9–10.3)
Chloride: 104 mmol/L (ref 98–110)
Chloride: 107 mmol/L (ref 101–111)
Creat: 0.96 mg/dL — ABNORMAL HIGH (ref 0.60–0.93)
Creatinine, Ser: 0.75 mg/dL (ref 0.44–1.00)
GFR calc Af Amer: >60 mL/min (ref >60)
GFR calc non Af Amer: >60 mL/min (ref >60)
Glucose, Bld: 188 mg/dL — ABNORMAL HIGH (ref 65–99)
Glucose, Bld: 158 mg/dL — ABNORMAL HIGH (ref 65–99)
Potassium: 5.2 mmol/L (ref 3.5–5.3)
Potassium: 4.6 mmol/L (ref 3.5–5.1)
Sodium: 134 mmol/L — ABNORMAL LOW (ref 135–146)
Sodium: 136 mmol/L (ref 135–145)

## 2016-02-23 LAB — CUP PACEART INCLINIC DEVICE CHECK
Battery Remaining Longevity: 138 mo
Battery Voltage: 3.14 V
Brady Statistic RV Percent Paced: 0.01 %
Date Time Interrogation Session: 20170412160749
HighPow Impedance: 60 Ohm
Implantable Lead Implant Date: 20170330
Implantable Lead Location: 753860
Lead Channel Impedance Value: 342 Ohm
Lead Channel Impedance Value: 399 Ohm
Lead Channel Setting Pacing Amplitude: 3.5 V
Lead Channel Setting Pacing Pulse Width: 0.4 ms
Lead Channel Setting Sensing Sensitivity: 0.3 mV

## 2016-02-23 LAB — MAGNESIUM: Magnesium: 1.9 mg/dL (ref 1.7–2.4)

## 2016-02-23 MED ORDER — FLUTICASONE PROPIONATE 50 MCG/ACT NA SUSP
2.0000 | Freq: Every day | NASAL | Status: DC | PRN
  Filled 2016-02-23: qty 16

## 2016-02-23 MED ORDER — FUROSEMIDE 80 MG PO TABS: 80.0000 mg | ORAL_TABLET | ORAL | Status: DC | PRN

## 2016-02-23 MED ORDER — ATORVASTATIN CALCIUM 20 MG PO TABS
20.0000 mg | ORAL_TABLET | Freq: Every day | ORAL | Status: DC
  Administered 2016-02-23 – 2016-02-24 (×2): 20 mg via ORAL
  Filled 2016-02-23 (×2): qty 1

## 2016-02-23 MED ORDER — SODIUM CHLORIDE 0.9% FLUSH
3.0000 mL | Freq: Two times a day (BID) | INTRAVENOUS | Status: DC
Start: 2016-02-23 — End: 2016-02-25
  Administered 2016-02-23 – 2016-02-24 (×4): 3 mL via INTRAVENOUS

## 2016-02-23 MED ORDER — ASPIRIN EC 81 MG PO TBEC
81.0000 mg | DELAYED_RELEASE_TABLET | Freq: Every day | ORAL | Status: DC
  Administered 2016-02-23 – 2016-02-25 (×3): 81 mg via ORAL
  Filled 2016-02-23 (×3): qty 1

## 2016-02-23 MED ORDER — ACETAMINOPHEN 325 MG PO TABS: 650.0000 mg | ORAL_TABLET | ORAL | Status: DC | PRN

## 2016-02-23 MED ORDER — LISINOPRIL 10 MG PO TABS
20.0000 mg | ORAL_TABLET | Freq: Two times a day (BID) | ORAL | Status: DC
  Administered 2016-02-23 – 2016-02-25 (×4): 20 mg via ORAL
  Filled 2016-02-23 (×4): qty 2

## 2016-02-23 MED ORDER — LEVETIRACETAM 500 MG PO TABS
500.0000 mg | ORAL_TABLET | Freq: Every day | ORAL | Status: DC
  Administered 2016-02-23 – 2016-02-24 (×2): 500 mg via ORAL
  Filled 2016-02-23 (×2): qty 1

## 2016-02-23 MED ORDER — CARVEDILOL 12.5 MG PO TABS
12.5000 mg | ORAL_TABLET | Freq: Two times a day (BID) | ORAL | Status: DC
  Administered 2016-02-23 – 2016-02-25 (×4): 12.5 mg via ORAL
  Filled 2016-02-23 (×4): qty 1

## 2016-02-23 MED ORDER — NITROGLYCERIN 0.4 MG SL SUBL
0.4000 mg | SUBLINGUAL_TABLET | SUBLINGUAL | Status: DC | PRN
  Administered 2016-02-24: 0.4 mg via SUBLINGUAL
  Filled 2016-02-23: qty 1

## 2016-02-23 MED ORDER — SOTALOL HCL 80 MG PO TABS
80.0000 mg | ORAL_TABLET | Freq: Two times a day (BID) | ORAL | Status: DC
  Administered 2016-02-23 – 2016-02-25 (×4): 80 mg via ORAL
  Filled 2016-02-23 (×4): qty 1

## 2016-02-23 MED ORDER — SODIUM CHLORIDE 0.9% FLUSH: 3.0000 mL | INTRAVENOUS | Status: DC | PRN

## 2016-02-23 MED ORDER — FLUTICASONE FUROATE 27.5 MCG/SPRAY NA SUSP: 2.0000 | Freq: Every day | NASAL | Status: DC | PRN

## 2016-02-23 MED ORDER — CARVEDILOL 12.5 MG PO TABS: 12.5000 mg | ORAL_TABLET | Freq: Two times a day (BID) | ORAL | Status: DC

## 2016-02-23 MED ORDER — SODIUM CHLORIDE 0.9 % IV SOLN: 250.0000 mL | INTRAVENOUS | Status: DC | PRN

## 2016-02-23 MED ORDER — ONDANSETRON HCL 4 MG/2ML IJ SOLN: 4.0000 mg | Freq: Four times a day (QID) | INTRAMUSCULAR | Status: DC | PRN

## 2016-02-23 MED ORDER — CARVEDILOL 25 MG PO TABS: 25.0000 mg | ORAL_TABLET | Freq: Two times a day (BID) | ORAL | Status: DC

## 2016-02-23 NOTE — Patient Instructions (Addendum)
Medication Instructions:  Your physician has recommended you make the following change in your medication:  1.  STOP the Amlodipine 2.  CHANGE the Lasix to as needed only 3.  INCREASE the Coreg to 25 mg taking 1 tablet twice a day (2 of the 12.5 mg tablets twice a day is fine until they are used up)   Labwork: TODAY:  BMET   Testing/Procedures: None ordered  Follow-Up: Your physician recommends that you schedule a follow-up appointment in: [place] Your physician recommends that you schedule a follow-up appointment in: [place] DR. Curt Bears  Any Other Special Instructions Will Be Listed Below (If Applicable).   If you need a refill on your cardiac medications before your next appointment, please call your pharmacy.

## 2016-02-23 NOTE — Progress Notes (Addendum)
Cardiology Office Note   Date:  02/23/2016   ID:  Colleen Lowery, DOB 12/05/1940, MRN [id number]  PCP:  [name], MD  Cardiologist:  Dr. Tamala Julian    Chief Complaint  Patient presents with  . Hospitalization Follow-up    seen for Dr. Tamala Julian      History of Present Illness: Colleen Lowery is a 75 y.o. female who presents for post hospital follow-up. She was recently admitted from 3/27-4/1. She has past medical history of HTN, DM 2, and seizure. She initially presented on 3/27 with 2 day history of shortness of breath. In the ED, it was noted she was in possible SVT with heart rate of 170 with right bundle branch block. She had no cardiac awareness of tachycardia. He did not convert after 2 doses of 6 mg of adenosine followed by additional 12 mg adenosine. Her blood pressure was also elevated in the 150 to 170s range. She underwent successful cardioversion in the ED. Her TSH was low and her free T4 was borderline high at 1.13. She also noted to have elevated d-dimer at 3.41 however this was felt to be related to arrhythmia and less likely pulmonary emboli. She eventually had a VQ scan on 3/28 which was low probability for PE. Given the likely complex tachycardia, electrophysiology was consulted, she was seen by Dr. Lovena Le. Given the right bundle branch block tachycardia that did not terminate with IV adenosine, it was suspected rhythm was actually VT. EP recommended getting a cardiac catheterization followed by EP study and ICD if EF is down on echocardiogram and obtaining MRI of the heart with gadolinium in has been in EF is normal.  Echocardiogram obtained on the same day showed EF 40-45%, mild hypokinesis of the inferior and inferoseptal myocardium, grade 2 diastolic dysfunction, severely dilated left atrium, mild MR, peak PA pressure 44 mmHg. He eventually underwent cardiac catheterization on 02/10/2016 which showed single-vessel CAD with 100% occlusion of mid to distal RCA, large wedge pressure  with V waves suggestive of potential ischemic MR, severe systemic hypertension and moderate LV systolic dysfunction. It was felt she likely require further diuresis and she was placed on 40 mg daily Lasix. On the same day, she had a Medtronic ICD placed by Dr. Curt Bears. Postprocedure chest x-ray showed no pneumothorax. She was started on ACE inhibitor as well as amlodipine. She was eventually discharged on 02/12/2016.   She presents today for post hospital cardiology follow-up. She has been doing well since discharge. EKG showed normal sinus rhythm with T-wave inversion in the inferior lead. She did have some soreness over the pacemaker site, however no sign of fever, chill, redness. She says she has stopped her Lasix as she was diuresing too much. She also complained of leg cramps in the bilateral upper thigh at rest. She also states she had cramps in the lower leg bilaterally with walking as wel, this is new since the recent hospitalization. I'm not sure if her lower leg bilateral cramps are related to low potassium, musculoskeletal issue, or PAD. I would hold off on obtaining lower extremity arterial ultrasound at this time and asked her to monitor for the next 3 months. If she continued to have lower extremity cramping on follow-up with ambulation, then she will need arterial ultrasound. Otherwise she is pending wound check at the device clinic today. She will see Dr. Curt Bears in 3 month. She has discontinued her lasix as she had significant diuresis and cramping afterward. We will obtain  basic metabolic panel today to monitor renal function. I'm okay with her taking Lasix on an PRN basis as she does not appears to be fluid overloaded on physical exam. I will increase her carvedilol to 25 mg twice a day and stop the amlodipine.   Past Medical History  Diagnosis Date  . Allergy   . Depression   . Diabetes mellitus without complication ([place])   . Hypertension   . Seizures ([place])   . Thyroid disease      Past Surgical History  Procedure Laterality Date  . Abdominal hysterectomy    . Tonsillectomy    . Cardiac catheterization  02/10/2016    Procedure: Right/Left Heart Cath and Coronary Angiography;  Surgeon: Leonie Man, MD;  Location: [facility];  Service: Cardiovascular;;  . Ep implantable device N/A 02/10/2016    Procedure: ICD Implant;  Surgeon: Will Meredith Leeds, MD;  Location: [facility];  Service: Cardiovascular;  Laterality: N/A;     Current Outpatient Prescriptions  Medication Sig Dispense Refill  . aspirin EC 81 MG EC tablet Take 1 tablet (81 mg total) by mouth daily.    [name] atorvastatin (LIPITOR) 20 MG tablet Take 1 tablet (20 mg total) by mouth daily at 6 PM. 30 tablet 11  . carvedilol (COREG) 25 MG tablet Take 1 tablet (25 mg total) by mouth 2 (two) times daily with a meal. 180 tablet 3  . Chlorpheniramine Maleate (CHLOR-TRIMETON PO) Take 0.25 tablets by mouth daily as needed (itching).    . EYEBRIGHT HERB PO Take 2 tablets by mouth daily.    . fluticasone (FLONASE SENSIMIST) 27.5 MCG/SPRAY nasal spray Place 2 sprays into the nose daily as needed for rhinitis.    . furosemide (LASIX) 80 MG tablet Take 1 tablet (80 mg total) by mouth as needed for fluid or edema. 30 tablet 11  . levETIRAcetam (KEPPRA) 500 MG tablet Take 500 mg by mouth at bedtime.    [name] lisinopril (PRINIVIL,ZESTRIL) 20 MG tablet Take 1 tablet (20 mg total) by mouth 2 (two) times daily. 30 tablet 11  . MILK THISTLE PO Take 5 tablets by mouth daily as needed (liver function).    . nitroGLYCERIN (NITROSTAT) 0.4 MG SL tablet Place 1 tablet (0.4 mg total) under the tongue every 5 (five) minutes x 3 doses as needed for chest pain. 25 tablet 12  . OVER THE COUNTER MEDICATION Take 4 tablets by mouth as needed (CHEST PRESSURE). Hypothalamus 500mg s    . OVER THE COUNTER MEDICATION Take 880 tablets by mouth as needed (BLOOD PRESSURE). Horsetail 440mg  tabs    . OVER THE COUNTER MEDICATION Take 2  tablets by mouth daily. Respiration blend sp-3 natural supplement    . OVER THE COUNTER MEDICATION Take 10 tablets by mouth daily. Liver rite herbal supplement    . potassium chloride SA (K-DUR,KLOR-CON) 20 MEQ tablet Take 1 tablet (20 mEq total) by mouth daily. 30 tablet 11  . Specialty Vitamins Products (LYMPH PO) Take 1,000 mg by mouth daily. Lymph beef 250mg     . Specialty Vitamins Products (PITUITARY) 35 MG TABS Take 2-3 tablets by mouth daily.    . Superoxide Dismutase (S.O.D. PO) Take 5-10 tablets by mouth daily as needed (burning).     No current facility-administered medications for this visit.    Allergies:   Contrast media; Dust mite extract; and Molds & smuts    Social History:  The patient  reports that she has been smoking.  She has  never used smokeless tobacco. She reports that she does not drink alcohol or use illicit drugs.   Family History:  The patient's family history includes Diabetes in her brother, maternal grandmother, mother, and paternal grandfather; Heart failure in her paternal grandfather; Hypertension in her brother; Stroke in her mother. There is no history of Heart attack.    ROS:  Please see the history of present illness.   Otherwise, review of systems are positive for leg cramps, soreness over pacemaker site.   All other systems are reviewed and negative.    PHYSICAL EXAM: VS:  BP 130/90 mmHg  Pulse 98  Ht 5' 2.25" (1.581 m)  Wt 146 lb (66.225 kg)  BMI 26.49 kg/m2  SpO2 99% , BMI Body mass index is 26.49 kg/(m^2). GEN: Well nourished, well developed, in no acute distress HEENT: normal Neck: no JVD, carotid bruits, or masses Cardiac: RRR; no murmurs, rubs, or gallops,no edema  Respiratory:  clear to auscultation bilaterally, normal work of breathing GI: soft, nontender, nondistended, + BS MS: no deformity or atrophy Skin: warm and dry, no rash Neuro:  Strength and sensation are intact Psych: euthymic mood, full affect   EKG:  EKG is ordered  today. The ekg ordered today demonstrates NSR with TWI in lead III and AVF   Recent Labs: 02/07/2016: B Natriuretic Peptide 1646.8*; Magnesium 1.9; TSH 0.184* 02/10/2016: Hemoglobin 11.9*; Platelets 265 02/12/2016: BUN 12; Creatinine, Ser 0.79; Potassium 3.0*; Sodium 142    Lipid Panel No results found for: CHOL, TRIG, HDL, CHOLHDL, VLDL, LDLCALC, LDLDIRECT    Wt Readings from Last 3 Encounters:  02/23/16 146 lb (66.225 kg)  02/12/16 151 lb 14.4 oz (68.901 kg)  11/03/14 153 lb 12.8 oz (69.763 kg)      Other studies Reviewed: Additional studies/ records that were reviewed today include:   Echo 02/08/2016 LV EF: 40% - 45%  ------------------------------------------------------------------- Indications: Chest pain 786.51.  ------------------------------------------------------------------- History: PMH: Thyroid disease. Risk factors: Hypertension. Diabetes mellitus.  ------------------------------------------------------------------- Study Conclusions  - Left ventricle: The cavity size was moderately to severely  dilated. There was mild concentric hypertrophy. Systolic function  was mildly to moderately reduced. The estimated ejection fraction  was in the range of 40% to 45%. Mild hypokinesis of the inferior  and inferoseptal myocardium. Features are consistent with a  pseudonormal left ventricular filling pattern, with concomitant  abnormal relaxation and increased filling pressure (grade 2  diastolic dysfunction). Doppler parameters are consistent with  elevated mean left atrial filling pressure. - Aortic valve: There was no stenosis. There was no regurgitation. - Mitral valve: Mobility of the anterior and posterior leaflet was  restricted. There was mild regurgitation. Effective regurgitant  orifice (PISA): 0.12 cm^2. - Left atrium: The atrium was severely dilated. - Right ventricle: The cavity size was normal. Wall thickness was  normal.  Systolic function was normal. - Pulmonary arteries: Systolic pressure was mildly increased. PA  peak pressure: 44 mm Hg (S). - Inferior vena cava: The vessel was dilated. The respirophasic  diameter changes were in the normal range (>= 50%), consistent  with elevated central venous pressure. - Pericardium, extracardiac: There was a left pleural effusion.   Cardiac cath 02/10/2016 Conclusion    1. 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 2. There is moderate left ventricular systolic dysfunction. Basal to mid inferior hypokinesis/akinesis 3. 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 4. Large PCWP V wave suggestive of potential ischemic MR 5.  Severe systemic hypertension   The patient has ischemic cardiomyopathy with an occluded RCA and very fine collaterals to the distal RCA branch vessels. I suspect that this is a subacute occlusion dating back to her initial onset discomfort a week ago. The occlusion appears to be consolidated and likely difficult to open at this time. Could consider CTO intervention in the future.  Patient has severe systemic hypertension with elevated LVEDP and wedge pressure needs to be treated. She likely also needs additional diuresis.  Plan:  Return to nursing unit for continued medical management.  I have increased her Lasix to 40 mg twice a day by mouth  Will defer to primary team and electrophysiology catheter inside the benefit for ICD.    ICD implantation by Dr. Curt Bears 02/10/2016  Medtronic model [id number] (serial number [id number]) right ventricular defibrillator lead  Medtronic Visia AF MRI (serial Number [id number]) ICD.  CONCLUSIONS:  1. 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  2. Ventricular tachycardia 3. Successful ICD implantation.  4. No early apparent complications.     Review of the above  records demonstrates:   Patient was recently admitted to the hospital with wide complex tachycardia requiring cardioversion in ED, echo showed EF 40-45%, cardiac cath showed occluded mid to distal RCA. She underwent Medtronic ICD placement.   ASSESSMENT AND PLAN:  1.  H/o VT s/p Medtronic ICD: no obvious complication, followup with wound clinic. 3 month followup with Dr. Curt Bears  2. CAD with chronically occluded RCA. Continue ASA, lipitor, coreg, lisinopril. No anginal, only complaint is soreness over the pacemaker site.  3. Abnormal thyroid lab: urged patient to followup with PCP  4. HTN: well controlled on current medication. Given recent VT, will d/c amlodipine and increase coreg to 25mg  BID   Addendum: on wound check today, she was noted to be in and out of VT. Surprisingly she has no cardiac awareness of VT. She was admitted to the hospital. Will defer to MD regarding EP study.     Current medicines are reviewed at length with the patient today.  The patient does not have concerns regarding medicines.  The following changes have been made:  Stop amlodipine and increase coreg to 25mg  BID  Labs/ tests ordered today include:   Orders Placed This Encounter  Procedures  . Basic Metabolic Panel (BMET)  . EKG 12-Lead     Disposition:   FU with Dr. Tamala Julian and Dr. Curt Bears in 3 months  Signed, Almyra Deforest, [place]  02/23/2016 12:00 PM    [address], [place], [place]  [id number] Phone: [phone]; Fax: [phone]   I have seen and examined this patient with Almyra Deforest.  Agree with above, note added to reflect my findings.  On exam, regular rhythm, no murmurs, lungs clear.  Having runs of NSVT while in clinic during device check.  Patient complains of SOB during runs.  Due to that, will plan to admit for sotalol dosing which will hopefully calm down her runs.  Would start amiodarone but she has had anaphylaxis with iodinated contrast in the past.       Will M. Camnitz MD 02/23/2016 2:31 PM

## 2016-02-23 NOTE — H&P (Signed)
Please see progress note from earlier today to serve as the H&P.  Tommye Standard, PA-C  Allegra Lai, MD

## 2016-02-23 NOTE — Progress Notes (Signed)
Pt received from transport. Pt denies pain or SOB at this time. VSS, CCMD notified, PA notified. Call light within reach. Will continue to monitor.   Fritz Pickerel, RN

## 2016-02-23 NOTE — Addendum Note (Signed)
Addended byEulas Post, Miyako Oelke on: 02/23/2016 04:24 PM   Modules accepted: Level of Service

## 2016-02-23 NOTE — Progress Notes (Signed)
Wound check appointment. Steri-strips removed. Wound without redness or edema. Incision edges approximated, wound well healed.   Normal device function, patient in and out of VT while in office, rates 167-178bpm. VS otherwise stable. Thresholds, sensing, and impedances not tested. 1,654 NSVT episodes, 16 SVT episodes. VT initial beats extended from 32 to 100 per WC. Patient educated about wound care, arm mobility, lifting restrictions, shock plan. Patient direct admitted to [facility] by Dr. Curt Bears, plan to start sotalol while admitted. ROV with WC on 03/02/16 at 11:30am, will do full device check at this appointment.

## 2016-02-24 DIAGNOSIS — I5042 Chronic combined systolic (congestive) and diastolic (congestive) heart failure: Secondary | ICD-10-CM | POA: Diagnosis present

## 2016-02-24 DIAGNOSIS — E119 Type 2 diabetes mellitus without complications: Secondary | ICD-10-CM | POA: Diagnosis present

## 2016-02-24 DIAGNOSIS — Z79899 Other long term (current) drug therapy: Secondary | ICD-10-CM | POA: Diagnosis not present

## 2016-02-24 DIAGNOSIS — G40909 Epilepsy, unspecified, not intractable, without status epilepticus: Secondary | ICD-10-CM | POA: Diagnosis present

## 2016-02-24 DIAGNOSIS — I255 Ischemic cardiomyopathy: Secondary | ICD-10-CM | POA: Diagnosis present

## 2016-02-24 DIAGNOSIS — Z95810 Presence of automatic (implantable) cardiac defibrillator: Secondary | ICD-10-CM | POA: Diagnosis not present

## 2016-02-24 DIAGNOSIS — I2510 Atherosclerotic heart disease of native coronary artery without angina pectoris: Secondary | ICD-10-CM | POA: Diagnosis present

## 2016-02-24 DIAGNOSIS — I110 Hypertensive heart disease with heart failure: Secondary | ICD-10-CM | POA: Diagnosis present

## 2016-02-24 DIAGNOSIS — Z7982 Long term (current) use of aspirin: Secondary | ICD-10-CM | POA: Diagnosis not present

## 2016-02-24 DIAGNOSIS — I472 Ventricular tachycardia: Secondary | ICD-10-CM | POA: Diagnosis present

## 2016-02-24 MED ORDER — ISOSORBIDE MONONITRATE ER 30 MG PO TB24
30.0000 mg | ORAL_TABLET | Freq: Every day | ORAL | Status: DC
  Administered 2016-02-24 – 2016-02-25 (×2): 30 mg via ORAL
  Filled 2016-02-24 (×2): qty 1

## 2016-02-24 NOTE — Care Management Obs Status (Signed)
[place] NOTIFICATION   Patient Details  Name: Colleen Lowery MRN: [id number] Date of Birth: Feb 16, 1941   Medicare Observation Status Notification Given:  Yes    Dawayne Patricia, RN 02/24/2016, 4:54 PM

## 2016-02-24 NOTE — Progress Notes (Signed)
    SUBJECTIVE: The patient is doing well today.  At this time, she denies chest pain, shortness of breath, or any new concerns.  [name] aspirin EC  81 mg Oral Daily  . atorvastatin  20 mg Oral q1800  . carvedilol  12.5 mg Oral BID WC  . levETIRAcetam  500 mg Oral QHS  . lisinopril  20 mg Oral BID  . sodium chloride flush  3 mL Intravenous Q12H  . sotalol  80 mg Oral Q12H      OBJECTIVE: Physical Exam: Filed Vitals:   02/23/16 1315 02/23/16 1320 02/23/16 1943 02/24/16 0330  BP: 117/90 115/81 93/60 121/59  Pulse: 87  85 62  Temp: 97.9 F (36.6 C)  98.2 F (36.8 C) 98.4 F (36.9 C)  TempSrc: Oral  Oral Oral  Resp: 20  20   Height: 5' 2.25" (1.581 m)     Weight: 146 lb (66.225 kg)     SpO2: 100%  100% 99%    Intake/Output Summary (Last 24 hours) at 02/24/16 3790 Last data filed at 02/23/16 1439  Gross per 24 hour  Intake    240 ml  Output      1 ml  Net    239 ml    Telemetry reveals SR, no VT since last evening  GEN- The patient is well appearing, alert and oriented x 3 today.   Head- normocephalic, atraumatic Eyes-  Sclera clear, conjunctiva pink Ears- hearing intact Oropharynx- clear Neck- supple, no JVP Lungs- Clear to ausculation bilaterally, normal work of breathing Heart- Regular rate and rhythm, no significant murmurs, no rubs or gallops GI- soft, NT, ND Extremities- no clubbing, cyanosis, or edema Skin- no rash or lesion Psych- euthymic mood, full affect Neuro- no gross deficits appreciated  LABS: Basic Metabolic Panel:  Recent Labs  02/23/16 1023 02/23/16 1419  NA 134* 136  K 5.2 4.6  CL 104 107  CO2 20 17*  GLUCOSE 188* 158*  BUN 10 9  CREATININE 0.96* 0.75  CALCIUM 9.7 9.5  MG  --  1.9    CBC:  Recent Labs  02/23/16 1419  WBC 9.3  NEUTROABS 4.7  HGB 13.8  HCT 41.6  MCV 86.0  PLT 322     ASSESSMENT AND PLAN:   1. VT     Sustained VT episodes noted incidentally at her device/wound check appointment yesterday, not overtly  symptomatic, only with intermittent feeling of breathlessness, no near syncope or syncope, no palpitations  Started on Sotalol     Check EKG   NO DRIVING  2. HTN     stable   3. CHF, ICM  compensated by exam     Pt declines further diuretics     On BB/ACE  4. CAD     S/p cath last admit, on 02/10/16, no intervention     No angina     On ASA, statin, will add a nitrate   Tommye Standard, PA-C 02/24/2016 7:21 AM   I have seen and examined this patient with Tommye Standard.  Agree with above, note added to reflect my findings.  On exam, regular rhythm, no murmurs, lungs clear.  Presented with runs of NSVT to the office and admitted for sotalol loading.  Since her first dose, has had a great decrease in her ventricular ectopy.  Will plan to continue sotalol and if tolerates tonight and tomorrow AM dose, will potentially plan for discharge.    Will M. Camnitz MD 02/24/2016 3:38 PM

## 2016-02-25 MED ORDER — ISOSORBIDE MONONITRATE ER 30 MG PO TB24: 30.0000 mg | ORAL_TABLET | Freq: Every day | ORAL | Status: DC

## 2016-02-25 MED ORDER — CARVEDILOL 12.5 MG PO TABS: 12.5000 mg | ORAL_TABLET | Freq: Two times a day (BID) | ORAL | Status: DC

## 2016-02-25 MED ORDER — SOTALOL HCL 80 MG PO TABS: 80.0000 mg | ORAL_TABLET | Freq: Two times a day (BID) | ORAL | Status: DC

## 2016-02-25 NOTE — Care Management Important Message (Signed)
Important Message  Patient Details  Name: Colleen Lowery MRN: [id number] Date of Birth: 10-Jun-1941   Medicare Important Message Given:  Yes    Colleen Lowery [place] 02/25/2016, 11:10 AM

## 2016-02-25 NOTE — Discharge Summary (Signed)
DISCHARGE SUMMARY    Patient ID: Colleen Lowery,  MRN: [id number], DOB/AGE: Jul 31, 1941 75 y.o.  Admit date: 02/23/2016 Discharge date: 02/25/2016  Primary Care Physician: [name], MD Primary Cardiologist: Dr. Tamala Julian Electrophysiologist: Dr. Curt Bears  Primary Discharge Diagnosis:  1. VT 2. ICM  Secondary Discharge Diagnosis:  1. CAD 2. HTN 3. DM 4. Seizure d/o  Allergies  Allergen Reactions  . Contrast Media [Iodinated Diagnostic Agents] Anaphylaxis    Throat closed, was 40 yrs ago  . Dust Mite Extract Swelling  . Molds & Smuts Swelling     Procedures This Admission:  None  Brief HPI: Colleen Lowery is a 75 y.o. female was admitted to [facility] directly from our office noting multiple episodes of VT on her device check/wound check visit.   Hospital Course:  The patient was at the Central office 02/23/16 for a scheduled post-hospital visit, wound check (noting her ICD site to be stable and well healed) and ICD check, Colleen was feeling well, mentioned intermittent feeling breathless, no dizziness, near syncope or syncope, no CP.  Colleen had her wound check and ICD check, this noting Colleen was having lengthy episodes of VT, associated with the feeling of breathlessness, not overtly SOB and no other symptoms to speak of.   Colleen has not had any kind of dizziness, no near syncope or syncope reported.  It was decided to start her on Sotalol for AAD therapy along with her carvedilol.  Colleen has done well with suppression of her VT after her first dose.  Colleen did mention one brief episode of CP and Imdur was added given her known CAD without further symptoms.   Her QT has remained stable.  The patient was examined by Dr. Curt Bears and considered stable for discharge to home. Colleen Lowery have early f/u with Dr. Curt Bears.  The patient has ICM, has has been Rx for lasix/K+ after her last hospital stay, though has since declined further diuresis and Colleen Lowery stop them.  Colleen has been counselled on  minimizing sodium and daily weights.  Physical Exam: Filed Vitals:   02/24/16 2155 02/25/16 0532 02/25/16 0828 02/25/16 1024  BP: 115/61 115/60 123/78 119/71  Pulse:  64 64   Temp:  98.1 F (36.7 C) 98.1 F (36.7 C)   TempSrc:  Oral Oral   Resp:   18   Height:      Weight:      SpO2:  99% 100%     GEN- The patient is well appearing, alert and oriented x 3 today.   HEENT: normocephalic, atraumatic; sclera clear, conjunctiva pink; hearing intact; oropharynx clear Lungs- Clear to ausculation bilaterally, normal work of breathing.  No wheezes, rales, rhonchi Heart- Regular rate and rhythm, no murmurs, rubs or gallops, PMI not laterally displaced GI- soft, non-tender, non-distended Extremities- no clubbing, cyanosis, or edema MS- no significant deformity or atrophy Skin- warm and dry, no rash or lesion Psych- euthymic mood, full affect Neuro- no gross defecits  Labs:   Lab Results  Component Value Date   WBC 9.3 02/23/2016   HGB 13.8 02/23/2016   HCT 41.6 02/23/2016   MCV 86.0 02/23/2016   PLT 322 02/23/2016     Recent Labs Lab 02/23/16 1419  NA 136  K 4.6  CL 107  CO2 17*  BUN 9  CREATININE 0.75  CALCIUM 9.5  GLUCOSE 158*    Discharge Medications:    Medication List    STOP taking these  medications        EYEBRIGHT HERB PO     furosemide 80 MG tablet  Commonly known as:  LASIX     LYMPH PO     MILK THISTLE PO     OVER THE COUNTER MEDICATION     OVER THE COUNTER MEDICATION     OVER THE COUNTER MEDICATION     OVER THE COUNTER MEDICATION     PITUITARY 35 MG Tabs     potassium chloride SA 20 MEQ tablet  Commonly known as:  K-DUR,KLOR-CON     S.O.D. PO      TAKE these medications        aspirin 81 MG EC tablet  Take 1 tablet (81 mg total) by mouth daily.     atorvastatin 20 MG tablet  Commonly known as:  LIPITOR  Take 1 tablet (20 mg total) by mouth daily at 6 PM.     carvedilol 12.5 MG tablet  Commonly known as:  COREG  Take 1  tablet (12.5 mg total) by mouth 2 (two) times daily with a meal.     CHLOR-TRIMETON PO  Take 0.25 tablets by mouth daily as needed (itching).     FLONASE SENSIMIST 27.5 MCG/SPRAY nasal spray  Generic drug:  fluticasone  Place 2 sprays into the nose daily as needed for rhinitis.     isosorbide mononitrate 30 MG 24 hr tablet  Commonly known as:  IMDUR  Take 1 tablet (30 mg total) by mouth daily.     levETIRAcetam 500 MG tablet  Commonly known as:  KEPPRA  Take 500 mg by mouth at bedtime.     lisinopril 20 MG tablet  Commonly known as:  PRINIVIL,ZESTRIL  Take 1 tablet (20 mg total) by mouth 2 (two) times daily.     nitroGLYCERIN 0.4 MG SL tablet  Commonly known as:  NITROSTAT  Place 1 tablet (0.4 mg total) under the tongue every 5 (five) minutes x 3 doses as needed for chest pain.     sotalol 80 MG tablet  Commonly known as:  BETAPACE  Take 1 tablet (80 mg total) by mouth 2 (two) times daily.        Disposition: Home Discharge Instructions    Diet - low sodium heart healthy    Complete by:  As directed      Increase activity slowly    Complete by:  As directed           Follow-up Information    Follow up with Colleen Mccarrick Meredith Leeds, MD On 03/02/2016.   Specialty:  Cardiology   Why:  11:30AM   Contact information:   [place] [id number] [phone]       Duration of Discharge Encounter: Greater than 30 minutes including physician time.  Colleen Night, PA-C 02/25/2016 12:57 PM   I have seen and examined this patient with Colleen Lowery.  Agree with above, note added to reflect my findings.  On exam, regular rhythm, no murmurs, lungs clear.  Had VT while in the office on device check.  Admitted to the hospital for sotalol loading.  Tolerated well with stable QTc.  Plan for discharge today on sotalol with follow up in clinic.    Colleen Eardley M. Colleen Crosley MD 02/25/2016 12:58 PM

## 2016-02-25 NOTE — Progress Notes (Addendum)
Pt in stable condition, went over dc instructions with pt , pt verbalised understanding, iv taken out, cardiac monitor dc, ccmd notified, pt belongings at bedside,pt taken off the floor on a wheelchair by a NT

## 2016-03-01 NOTE — Progress Notes (Signed)
Cardiology Office Note   Date:  03/02/2016   ID:  Colleen Lowery, DOB 11/12/1941, MRN [id number]  PCP:  [name], MD  Cardiologist:  Dr. Tamala Julian    Chief Complaint  Patient presents with  . ICD check      History of Present Illness: Colleen Lowery is a 75 y.o. female who presents for post hospital follow-up. She was recently admitted from 3/27-4/1. She has past medical history of HTN, DM 2, and seizure. She initially presented on 3/27 with 2 day history of shortness of breath. In the ED, it was noted she was in possible SVT with heart rate of 170 with right bundle branch block. She had no cardiac awareness of tachycardia. He did not convert after 2 doses of 6 mg of adenosine followed by additional 12 mg adenosine. Her blood pressure was also elevated in the 150 to 170s range. She underwent successful cardioversion in the ED. Her TSH was low and her free T4 was borderline high at 1.13.   Echocardiogram obtained showed EF 40-45%, mild hypokinesis of the inferior and inferoseptal myocardium, grade 2 diastolic dysfunction, severely dilated left atrium, . 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 She had a Medtronic ICD placed.    She presented to device clinic 02/23/16 and was found to be in and out of VT.  She was admitted to the hospital for sotalol loading.  Since that time, she had had no further VT.  she is feeling much better with quite a bit more energy.  She is able to do all of her daily activities without issues.   Past Medical History  Diagnosis Date  . Allergy   . Depression   . Diabetes mellitus without complication ([place])   . Hypertension   . Seizures ([place])   . Thyroid disease   . Heart failure, acute, systolic and diastolic ([place])   . Shortness of breath  dyspnea     Past Surgical History  Procedure Laterality Date  . Abdominal hysterectomy    . Tonsillectomy    . Cardiac catheterization  02/10/2016    Procedure: Right/Left Heart Cath and Coronary Angiography;  Surgeon: Leonie Man, MD;  Location: [place] [facility];  Service: Cardiovascular;;  . Ep implantable device N/A 02/10/2016    Procedure: ICD Implant;  Surgeon: Will Meredith Leeds, MD;  Location: [place] [facility];  Service: Cardiovascular;  Laterality: N/A;     Current Outpatient Prescriptions  Medication Sig Dispense Refill  . aspirin EC 81 MG EC tablet Take 1 tablet (81 mg total) by mouth daily.    [name] atorvastatin (LIPITOR) 20 MG tablet Take 1 tablet (20 mg total) by mouth daily at 6 PM. 30 tablet 11  . carvedilol (COREG) 12.5 MG tablet Take 1 tablet (12.5 mg total) by mouth 2 (two) times daily with a meal. 60 tablet 6  . Chlorpheniramine Maleate (CHLOR-TRIMETON PO) Take 0.25 tablets by mouth daily as needed (itching).    . fluticasone (FLONASE SENSIMIST) 27.5 MCG/SPRAY nasal spray Place 2 sprays into the nose daily as needed for rhinitis.    [name] isosorbide mononitrate (IMDUR) 30 MG 24 hr tablet Take 1 tablet (30 mg total) by mouth daily. 30 tablet 6  . levETIRAcetam (KEPPRA) 500 MG tablet Take 500 mg by mouth at bedtime.    [name]  lisinopril (PRINIVIL,ZESTRIL) 20 MG tablet Take 1 tablet (20 mg total) by mouth 2 (two) times daily. 30 tablet 11  . nitroGLYCERIN (NITROSTAT) 0.4 MG SL tablet Place 1 tablet (0.4 mg total) under the tongue every 5 (five) minutes x 3 doses as needed for chest pain. 25 tablet 12  . sotalol (BETAPACE) 80 MG tablet Take 1 tablet (80 mg total) by mouth 2 (two) times daily. 60 tablet 6   No current facility-administered medications for this visit.    Allergies:   Contrast media; Dust mite extract; and Molds & smuts    Social History:  The patient  reports that she has been smoking Cigarettes.  She has a 50 pack-year smoking history. She has never used  smokeless tobacco. She reports that she does not drink alcohol or use illicit drugs.   Family History:  The patient's family history includes Diabetes in her brother, maternal grandmother, mother, and paternal grandfather; Heart failure in her paternal grandfather; Hypertension in her brother; Stroke in her mother. There is no history of Heart attack.    ROS:  Please see the history of present illness.   Otherwise, review of systems are positive for muscle pain, leg pain.   All other systems are reviewed and negative.    PHYSICAL EXAM: VS:  BP 128/68 mmHg  Pulse 57  Ht 5' 2"  (1.575 m)  Wt 148 lb 12.8 oz (67.495 kg)  BMI 27.21 kg/m2 , BMI Body mass index is 27.21 kg/(m^2). GEN: Well nourished, well developed, in no acute distress HEENT: normal Neck: no JVD, carotid bruits, or masses Cardiac: RRR; no murmurs, rubs, or gallops,no edema  Respiratory:  clear to auscultation bilaterally, normal work of breathing GI: soft, nontender, nondistended, + BS MS: no deformity or atrophy Skin: warm and dry, no rash, device site C/D/I Neuro:  Strength and sensation are intact Psych: euthymic mood, full affect   EKG:  EKG is ordered today. The ekg ordered today demonstrates NSR with TWI in lead III and AVF  ICD interrogation performed today and reviewed.  Details in [place].  Recent Labs: 02/07/2016: B Natriuretic Peptide 1646.8*; TSH 0.184* 02/23/2016: BUN 9; Creatinine, Ser 0.75; Hemoglobin 13.8; Magnesium 1.9; Platelets 322; Potassium 4.6; Sodium 136    Lipid Panel No results found for: CHOL, TRIG, HDL, CHOLHDL, VLDL, LDLCALC, LDLDIRECT    Wt Readings from Last 3 Encounters:  03/02/16 148 lb 12.8 oz (67.495 kg)  02/23/16 146 lb (66.225 kg)  02/23/16 146 lb (66.225 kg)      Other studies Reviewed: Additional studies/ records that were reviewed today include:   Echo 02/08/2016 LV EF: 40% - 45%  ------------------------------------------------------------------- Indications:  Chest pain 786.51.  ------------------------------------------------------------------- History: PMH: Thyroid disease. Risk factors: Hypertension. Diabetes mellitus.  ------------------------------------------------------------------- Study Conclusions  - Left ventricle: The cavity size was moderately to severely  dilated. There was mild concentric hypertrophy. Systolic function  was mildly to moderately reduced. The estimated ejection fraction  was in the range of 40% to 45%. Mild hypokinesis of the inferior  and inferoseptal myocardium. Features are consistent with a  pseudonormal left ventricular filling pattern, with concomitant  abnormal relaxation and increased filling pressure (grade 2  diastolic dysfunction). Doppler parameters are consistent with  elevated mean left atrial filling pressure. - Aortic valve: There was no stenosis. There was no regurgitation. - Mitral valve: Mobility of the anterior and posterior leaflet was  restricted. There was mild regurgitation. Effective regurgitant  orifice (PISA): 0.12 cm^2. - Left atrium: The atrium was  severely dilated. - Right ventricle: The cavity size was normal. Wall thickness was  normal. Systolic function was normal. - Pulmonary arteries: Systolic pressure was mildly increased. PA  peak pressure: 44 mm Hg (S). - Inferior vena cava: The vessel was dilated. The respirophasic  diameter changes were in the normal range (>= 50%), consistent  with elevated central venous pressure. - Pericardium, extracardiac: There was a left pleural effusion.   Cardiac cath 02/10/2016 Conclusion    1. 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 2. There is moderate left ventricular systolic dysfunction. Basal to mid inferior hypokinesis/akinesis 3. 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 4. Large PCWP V wave suggestive of potential  ischemic MR 5. Severe systemic hypertension   The patient has ischemic cardiomyopathy with an occluded RCA and very fine collaterals to the distal RCA branch vessels. I suspect that this is a subacute occlusion dating back to her initial onset discomfort a week ago. The occlusion appears to be consolidated and likely difficult to open at this time. Could consider CTO intervention in the future.  Patient has severe systemic hypertension with elevated LVEDP and wedge pressure needs to be treated. She likely also needs additional diuresis.  Plan:  Return to nursing unit for continued medical management.  I have increased her Lasix to 40 mg twice a day by mouth  Will defer to primary team and electrophysiology catheter inside the benefit for ICD.    ICD implantation by Dr. Curt Bears 02/10/2016  Medtronic model [id number] (serial number [id number]) right ventricular defibrillator lead  Medtronic Visia AF MRI (serial Number [id number]) ICD.  CONCLUSIONS:  1. 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  2. Ventricular tachycardia 3. Successful ICD implantation.  4. No early apparent complications.    ASSESSMENT AND PLAN:  1.  H/o VT s/p Medtronic ICD: Found to be in VT in clinic.  Admitted to the hospital for sotalol loading.  Tolerated load well with minimal change in QTc.  In clinic today with significantly less ventricular tachycardia. She has not had any since April 12 around the time of her sotalol load. We'll continue the sotalol. No changes were made to the ICD at this time.  2. CAD with chronically occluded RCA. Continue ASA, lipitor, coreg, lisinopril. No anginal, only complaint is soreness over the pacemaker site.  3.  HTN: on sotalol and coreg   Current medicines are reviewed at length with the patient today.  The patient does not have concerns regarding medicines.  The following changes have been made:  Stop amlodipine and  increase coreg to 51m BID  Labs/ tests ordered today include:   Orders Placed This Encounter  Procedures  . EKG 12-Lead     Disposition:   FU with Dr. STamala Julianand Dr. CCurt Bearsin 3 months  Signed, Will MMeredith Leeds MD  03/02/2016 11:51 AM    [place]  [id number]: [phone] Fax: [phone]

## 2016-03-02 ENCOUNTER — Encounter: Payer: Self-pay | Admitting: Cardiology

## 2016-03-02 ENCOUNTER — Ambulatory Visit (INDEPENDENT_AMBULATORY_CARE_PROVIDER_SITE_OTHER): Payer: Medicare HMO | Admitting: Cardiology

## 2016-03-02 VITALS — BP 128/68 | HR 57 | Ht 62.0 in | Wt 148.8 lb

## 2016-03-02 DIAGNOSIS — I472 Ventricular tachycardia, unspecified: Secondary | ICD-10-CM

## 2016-03-02 DIAGNOSIS — Z4502 Encounter for adjustment and management of automatic implantable cardiac defibrillator: Secondary | ICD-10-CM

## 2016-03-02 LAB — CUP PACEART INCLINIC DEVICE CHECK
Battery Remaining Longevity: 138 mo
Battery Voltage: 3.16 V
Brady Statistic RV Percent Paced: 0.01 %
Date Time Interrogation Session: 20170420115721
HighPow Impedance: 56 Ohm
Implantable Lead Implant Date: 20170330
Implantable Lead Location: 753860
Lead Channel Impedance Value: 342 Ohm
Lead Channel Impedance Value: 399 Ohm
Lead Channel Pacing Threshold Amplitude: 1 V
Lead Channel Pacing Threshold Pulse Width: 0.4 ms
Lead Channel Sensing Intrinsic Amplitude: 21.5 mV
Lead Channel Setting Pacing Amplitude: 3.5 V
Lead Channel Setting Pacing Pulse Width: 0.4 ms
Lead Channel Setting Sensing Sensitivity: 0.3 mV

## 2016-03-02 NOTE — Patient Instructions (Signed)
Medication Instructions:  Your physician recommends that you continue on your current medications as directed. Please refer to the Current Medication list given to you today.  Labwork: None ordered  Testing/Procedures: None ordered  Follow-Up: Keep your currently scheduled follow up with Dr. Curt Bears on  05/24/2016 at 12:00 p.m.  Any Other Special Instructions Will Be Listed Below (If Applicable).  If you need a refill on your cardiac medications before your next appointment, please call your pharmacy.  Thank you for choosing [facility]!!   Trinidad Curet, RN [phone]

## 2016-04-04 ENCOUNTER — Telehealth: Payer: Self-pay | Admitting: Cardiology

## 2016-04-04 MED ORDER — CARVEDILOL 6.25 MG PO TABS: 6.2500 mg | ORAL_TABLET | Freq: Two times a day (BID) | ORAL | Status: DC

## 2016-04-04 NOTE — Telephone Encounter (Signed)
NEw MEssage  Pt requestd to speak w/ RN- stated that her medications (possibly the beta blocker) is causing her low pulse- stated it has been in the 44-46 range. Please call back and discuss.

## 2016-04-04 NOTE — Telephone Encounter (Signed)
Patient reports HRs 40-50s, but mostly 40s. States she is slightly light-headed but denies other symptoms.. Discussed with Dr. Curt Bears. Advised patient to decrease Carvedilol to 6.25 mg twice daily. Instructed to call office if HR does not improve or symptom/s worsens. Patient verbalized understanding and agreeable to plan.

## 2016-04-21 ENCOUNTER — Telehealth: Payer: Self-pay | Admitting: Interventional Cardiology

## 2016-04-21 DIAGNOSIS — M79604 Pain in right leg: Secondary | ICD-10-CM

## 2016-04-21 DIAGNOSIS — I5040 Unspecified combined systolic (congestive) and diastolic (congestive) heart failure: Secondary | ICD-10-CM

## 2016-04-21 DIAGNOSIS — M79605 Pain in left leg: Secondary | ICD-10-CM

## 2016-04-21 NOTE — Telephone Encounter (Signed)
Pt states she has been having left leg pain since 02/23/16.  It starts above the ankle and moves up the leg.  Pt took statin medication for only 3 weeks and stopped it because she felt that it was causing the pain.  Pt states pain improved after stopping statin but it has not resolved completely.  Denies swelling, redness or heat to area.  Pt would like to know what Dr. Tamala Julian thinks or if he feels she needs a doppler.  Pt would also like to know if he would check a BMET because she's afraid her K+ maybe low and causing cramps in her legs because she has had a problem with this is in the past.  Advised pt I will sent message to Dr. Tamala Julian for review and advisement.

## 2016-04-21 NOTE — Telephone Encounter (Signed)
New message    Patient calling  C/o left leg pain.     No chest pain   No sob.

## 2016-04-22 NOTE — Telephone Encounter (Signed)
Stay off statin. Check BMET and Magnesium. Do bilateral lower extremity doppler.

## 2016-04-24 NOTE — Telephone Encounter (Signed)
Left message to call back  

## 2016-04-25 NOTE — Telephone Encounter (Signed)
F/u  Pt returning RN phone call. Please call back and discuss.   

## 2016-04-26 NOTE — Telephone Encounter (Signed)
Returned pt's call. lmtcb

## 2016-04-26 NOTE — Telephone Encounter (Signed)
Lisinopril has better data for improvement in function of the heart.

## 2016-04-26 NOTE — Telephone Encounter (Signed)
Spoke with pt. Pt aware of Dr.Smith's recommendation. Stay off statin. Check BMET and Magnesium. Do bilateral lower extremity doppler. Adv pt that a scheduler from our office will call her.   Pt sts that she has several concerns that were not addressed after her hospital discharge for the hospital. 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 She has always had a issue keeping her electrolytes in balance, she also has a hx of peptic ulcers. It was recommended that she take a coated Asa 30m daily. She did that for a about a week, but began to have severe stomach pains, so she stopped. She denies any sign of bleeding, no reoccurrence of stomach pain since stopping aspirin.  She was taking losartan in the past for blood pressure, and want to know if she can be switched from lisinopril back to losartan. I adv her of the indication for lisinopril it can be used to treat hytpertention and is an important part of her heart failure regimen . She does not feel she is getting good blood pressure control, but is unable to provide readings. Pt has an appt with Dr.Smith scheduled on 7/20 with a echo the same day, adv her that she can have that discussion with him at her f/u appt.Pt insist that the question be fwd to Dr.Smith.adv her I would and call back with his response. Pt voiced appreciation and verbalized understanding.

## 2016-04-27 ENCOUNTER — Other Ambulatory Visit: Payer: Self-pay | Admitting: Interventional Cardiology

## 2016-04-27 DIAGNOSIS — M79605 Pain in left leg: Secondary | ICD-10-CM

## 2016-04-27 DIAGNOSIS — M79604 Pain in right leg: Secondary | ICD-10-CM

## 2016-04-27 NOTE — Telephone Encounter (Signed)
Follow up    Patient calling back to speak with nurse

## 2016-04-27 NOTE — Telephone Encounter (Signed)
Called to give pt Dr.Smith's response.lmtcb 

## 2016-04-27 NOTE — Telephone Encounter (Signed)
Pt aware of Dr.Smith's response with verbal understanding.

## 2016-05-08 DIAGNOSIS — M79604 Pain in right leg: Secondary | ICD-10-CM | POA: Diagnosis not present

## 2016-05-08 DIAGNOSIS — M79605 Pain in left leg: Secondary | ICD-10-CM | POA: Diagnosis not present

## 2016-05-09 ENCOUNTER — Ambulatory Visit (HOSPITAL_COMMUNITY)
Admission: RE | Admit: 2016-05-09 | Discharge: 2016-05-09 | Disposition: A | Discharge status: Home / Self Care | Payer: Medicare HMO | Source: Ambulatory Visit | Attending: Interventional Cardiology | Admitting: Interventional Cardiology

## 2016-05-09 ENCOUNTER — Other Ambulatory Visit (INDEPENDENT_AMBULATORY_CARE_PROVIDER_SITE_OTHER): Payer: Medicare HMO

## 2016-05-09 DIAGNOSIS — I771 Stricture of artery: Secondary | ICD-10-CM | POA: Insufficient documentation

## 2016-05-09 DIAGNOSIS — M79605 Pain in left leg: Secondary | ICD-10-CM | POA: Diagnosis not present

## 2016-05-09 DIAGNOSIS — I700 Atherosclerosis of aorta: Secondary | ICD-10-CM | POA: Diagnosis not present

## 2016-05-09 DIAGNOSIS — F329 Major depressive disorder, single episode, unspecified: Secondary | ICD-10-CM | POA: Diagnosis not present

## 2016-05-09 DIAGNOSIS — I5040 Unspecified combined systolic (congestive) and diastolic (congestive) heart failure: Secondary | ICD-10-CM | POA: Diagnosis not present

## 2016-05-09 DIAGNOSIS — I10 Essential (primary) hypertension: Secondary | ICD-10-CM | POA: Insufficient documentation

## 2016-05-09 DIAGNOSIS — I70202 Unspecified atherosclerosis of native arteries of extremities, left leg: Secondary | ICD-10-CM | POA: Diagnosis not present

## 2016-05-09 DIAGNOSIS — I708 Atherosclerosis of other arteries: Secondary | ICD-10-CM | POA: Diagnosis not present

## 2016-05-09 DIAGNOSIS — R928 Other abnormal and inconclusive findings on diagnostic imaging of breast: Secondary | ICD-10-CM | POA: Insufficient documentation

## 2016-05-09 DIAGNOSIS — E119 Type 2 diabetes mellitus without complications: Secondary | ICD-10-CM | POA: Insufficient documentation

## 2016-05-09 DIAGNOSIS — M79604 Pain in right leg: Secondary | ICD-10-CM | POA: Insufficient documentation

## 2016-05-09 LAB — BASIC METABOLIC PANEL
BUN: 11 mg/dL (ref 7–25)
CO2: 19 mmol/L — ABNORMAL LOW (ref 20–31)
Calcium: 8.9 mg/dL (ref 8.6–10.4)
Chloride: 100 mmol/L (ref 98–110)
Creat: 0.92 mg/dL (ref 0.60–0.93)
Glucose, Bld: 238 mg/dL — ABNORMAL HIGH (ref 65–99)
Potassium: 3.7 mmol/L (ref 3.5–5.3)
Sodium: 132 mmol/L — ABNORMAL LOW (ref 135–146)

## 2016-05-09 LAB — MAGNESIUM: Magnesium: 1.9 mg/dL (ref 1.5–2.5)

## 2016-05-11 ENCOUNTER — Telehealth: Payer: Self-pay | Admitting: Interventional Cardiology

## 2016-05-11 NOTE — Telephone Encounter (Signed)
Follow up      Calling to get lab results----pt request to be called back today

## 2016-05-11 NOTE — Telephone Encounter (Signed)
Per pt calling back for her lab results.

## 2016-05-11 NOTE — Telephone Encounter (Signed)
Refill dept does not call back lab results.

## 2016-05-11 NOTE — Telephone Encounter (Signed)
Discussed lab results done 05/09/16 with patient.

## 2016-05-18 ENCOUNTER — Other Ambulatory Visit: Payer: Self-pay

## 2016-05-23 NOTE — Progress Notes (Signed)
Cardiology Office Note   Date:  05/24/2016   ID:  Colleen Lowery, DOB 22-Oct-1941, MRN [id number]  PCP:  [name], MD  Cardiologist:  Dr. Tamala Julian   Electrophysiologist: Curt Bears  Chief Complaint  Patient presents with  . Follow-up    defib check      History of Present Illness: Colleen Lowery is a 75 y.o. female who presents for post hospital follow-up. She was recently admitted from 3/27-4/1. She has past medical history of HTN, DM 2, and seizure. She initially presented on 3/27 with 2 day history of shortness of breath. In the ED, it was noted she was in possible SVT with heart rate of 170 with right bundle branch block. She underwent successful cardioversion in the ED. Her TSH was low and her free T4 was borderline high at 1.13.   Echocardiogram obtained showed EF 40-45%, mild hypokinesis of the inferior and inferoseptal myocardium, grade 2 diastolic dysfunction, severely dilated left atrium, . 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 She had a Medtronic ICD placed.    She presented to device clinic 02/23/16 and was found to be in and out of VT.  She was admitted to the hospital for sotalol loading.  Since sotalol loading, she has done well without any major complaints. She has not noticed any further episodes of SVT. She has noticed that her heart rate has gotten low down into the 40s at times and she feels weak and tired as well. Her Coreg was decreased to 6.25 recently.  Today, she denies symptoms of palpitations, chest pain, shortness of breath, orthopnea, PND, lower extremity edema, claudication, dizziness, presyncope, syncope, bleeding, or neurologic sequela. The patient is tolerating medications without difficulties and is otherwise without complaint today.     Past Medical History  Diagnosis Date  . Allergy   . Depression   . Diabetes mellitus without complication ([place])   . Hypertension   . Seizures ([place])   . Thyroid disease   . Heart failure, acute, systolic and diastolic ([place])   . Shortness of breath dyspnea     Past Surgical History  Procedure Laterality Date  . Abdominal hysterectomy    . Tonsillectomy    . Cardiac catheterization  02/10/2016    Procedure: Right/Left Heart Cath and Coronary Angiography;  Surgeon: Colleen Man, MD;  Location: [facility];  Service: Cardiovascular;;  . Ep implantable device N/A 02/10/2016    Procedure: ICD Implant;  Surgeon: Colleen Parco Meredith Leeds, MD;  Location: [facility];  Service: Cardiovascular;  Laterality: N/A;     Current Outpatient Prescriptions  Medication Sig Dispense Refill  . carvedilol (COREG) 6.25 MG tablet Take 1 tablet (6.25 mg total) by mouth 2 (two) times daily with a meal. 60 tablet 3  . fluticasone (FLONASE SENSIMIST) 27.5 MCG/SPRAY nasal spray Place 2 sprays into the nose daily as needed for rhinitis.    [name] isosorbide mononitrate (IMDUR) 30 MG 24 hr tablet Take 1 tablet (30 mg total) by mouth daily. 30 tablet 6  . levETIRAcetam (KEPPRA) 500 MG tablet Take 500 mg by mouth at bedtime.    [name] lisinopril (PRINIVIL,ZESTRIL) 20 MG tablet Take 1 tablet (20 mg total) by mouth 2 (two) times daily. 30 tablet 11  . nitroGLYCERIN (NITROSTAT) 0.4 MG SL tablet Place  1 tablet (0.4 mg total) under the tongue every 5 (five) minutes x 3 doses as needed for chest pain. 25 tablet 12  . sotalol (BETAPACE) 80 MG tablet Take 1 tablet (80 mg total) by mouth 2 (two) times daily. 60 tablet 6   No current facility-administered medications for this visit.    Allergies:   Contrast media; Dust mite extract; and Molds & smuts    Social History:  The patient  reports that she has been smoking Cigarettes.  She has a 50 pack-year smoking history. She has never used smokeless tobacco. She  reports that she does not drink alcohol or use illicit drugs.   Family History:  The patient's family history includes Diabetes in her brother, maternal grandmother, mother, and paternal grandfather; Heart failure in her paternal grandfather; Hypertension in her brother; Stroke in her mother. There is no history of Heart attack.    ROS:  Please see the history of present illness.   Otherwise, review of systems are positive for fatigue, cough.   All other systems are reviewed and negative.    PHYSICAL EXAM: VS:  BP 132/66 mmHg  Pulse 73  Ht 5' 2"  (1.575 m)  Wt 144 lb 1.9 oz (65.372 kg)  BMI 26.35 kg/m2 , BMI Body mass index is 26.35 kg/(m^2). GEN: Well nourished, well developed, in no acute distress HEENT: normal Neck: no JVD, carotid bruits, or masses Cardiac: RRR; no murmurs, rubs, or gallops,no edema  Respiratory:  clear to auscultation bilaterally, normal work of breathing GI: soft, nontender, nondistended, + BS MS: no deformity or atrophy Skin: warm and dry, no rash, device site C/D/I Neuro:  Strength and sensation are intact Psych: euthymic mood, full affect   EKG:  EKG is ordered today. The ekg ordered today demonstrates sinus rhythm, inferior Q waves, rate 73  ICD interrogation performed today and reviewed.  Details in [place].  Recent Labs: 02/07/2016: B Natriuretic Peptide 1646.8*; TSH 0.184* 02/23/2016: Hemoglobin 13.8; Platelets 322 05/09/2016: BUN 11; Creat 0.92; Magnesium 1.9; Potassium 3.7; Sodium 132*    Lipid Panel No results found for: CHOL, TRIG, HDL, CHOLHDL, VLDL, LDLCALC, LDLDIRECT    Wt Readings from Last 3 Encounters:  05/24/16 144 lb 1.9 oz (65.372 kg)  03/02/16 148 lb 12.8 oz (67.495 kg)  02/23/16 146 lb (66.225 kg)      Other studies Reviewed: Additional studies/ records that were reviewed today include:   Echo 02/08/2016 LV EF: 40% - 45%  ------------------------------------------------------------------- Indications: Chest pain  786.51.  ------------------------------------------------------------------- History: PMH: Thyroid disease. Risk factors: Hypertension. Diabetes mellitus.  ------------------------------------------------------------------- Study Conclusions  - Left ventricle: The cavity size was moderately to severely  dilated. There was mild concentric hypertrophy. Systolic function  was mildly to moderately reduced. The estimated ejection fraction  was in the range of 40% to 45%. Mild hypokinesis of the inferior  and inferoseptal myocardium. Features are consistent with a  pseudonormal left ventricular filling pattern, with concomitant  abnormal relaxation and increased filling pressure (grade 2  diastolic dysfunction). Doppler parameters are consistent with  elevated mean left atrial filling pressure. - Aortic valve: There was no stenosis. There was no regurgitation. - Mitral valve: Mobility of the anterior and posterior leaflet was  restricted. There was mild regurgitation. Effective regurgitant  orifice (PISA): 0.12 cm^2. - Left atrium: The atrium was severely dilated. - Right ventricle: The cavity size was normal. Wall thickness was  normal. Systolic function was normal. - Pulmonary arteries: Systolic pressure was mildly increased. PA  peak  pressure: 44 mm Hg (S). - Inferior vena cava: The vessel was dilated. The respirophasic  diameter changes were in the normal range (>= 50%), consistent  with elevated central venous pressure. - Pericardium, extracardiac: There was a left pleural effusion.   Cardiac cath 02/10/2016 Conclusion    1. 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 2. There is moderate left ventricular systolic dysfunction. Basal to mid inferior hypokinesis/akinesis 3. 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 4. Large PCWP V wave suggestive of potential ischemic  MR 5. Severe systemic hypertension   The patient has ischemic cardiomyopathy with an occluded RCA and very fine collaterals to the distal RCA branch vessels. I suspect that this is a subacute occlusion dating back to her initial onset discomfort a week ago. The occlusion appears to be consolidated and likely difficult to open at this time. Could consider CTO intervention in the future.  Patient has severe systemic hypertension with elevated LVEDP and wedge pressure needs to be treated. She likely also needs additional diuresis.  Plan:  Return to nursing unit for continued medical management.  I have increased her Lasix to 40 mg twice a day by mouth  Camillia Marcy defer to primary team and electrophysiology catheter inside the benefit for ICD.    ICD implantation by Dr. Curt Bears 02/10/2016  Medtronic model [id number] (serial number [id number]) right ventricular defibrillator lead  Medtronic Visia AF MRI (serial Number [id number]) ICD.  CONCLUSIONS:  1. 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  2. Ventricular tachycardia 3. Successful ICD implantation.  4. No early apparent complications.    ASSESSMENT AND PLAN:  1.  H/o VT s/p Medtronic ICD: Found to be in VT in clinic.  Admitted to the hospital for sotalol loading.  Tolerated load well with minimal change in QTc.  She is having episodes of bradycardia with fatigue. We'll decrease her carvedilol to 3.125 mg twice daily. Hopefully she Anquan Azzarello be able to tolerate that medication, as she does have evidence of heart failure.  2. CAD with chronically occluded RCA. Continue ASA, lipitor, coreg, lisinopril. No anginal, only complaint is soreness over the pacemaker site.  3.  HTN: on sotalol and coreg   Current medicines are reviewed at length with the patient today.  The patient does not have concerns regarding medicines.  The following changes have been made:  Stop amlodipine and increase coreg  to 85m BID  Labs/ tests ordered today include:   Orders Placed This Encounter  Procedures  . EKG 12-Lead     Disposition:   FU with Dr. STamala Julianand Dr. CCurt Bearsin 3 months  Signed, Brittian Renaldo MMeredith Leeds MD  05/24/2016 12:21 PM    [facility] [address] [place]  [id number]: [phone] Fax: [phone]

## 2016-05-24 ENCOUNTER — Encounter: Payer: Self-pay | Admitting: Cardiology

## 2016-05-24 ENCOUNTER — Ambulatory Visit (INDEPENDENT_AMBULATORY_CARE_PROVIDER_SITE_OTHER): Payer: Medicare HMO | Admitting: Cardiology

## 2016-05-24 ENCOUNTER — Encounter (INDEPENDENT_AMBULATORY_CARE_PROVIDER_SITE_OTHER): Payer: Self-pay

## 2016-05-24 VITALS — BP 132/66 | HR 73 | Ht 62.0 in | Wt 144.1 lb

## 2016-05-24 DIAGNOSIS — Z4502 Encounter for adjustment and management of automatic implantable cardiac defibrillator: Secondary | ICD-10-CM | POA: Diagnosis not present

## 2016-05-24 DIAGNOSIS — I472 Ventricular tachycardia, unspecified: Secondary | ICD-10-CM

## 2016-05-24 LAB — CUP PACEART INCLINIC DEVICE CHECK
Battery Remaining Longevity: 137 mo
Battery Voltage: 3.14 V
Brady Statistic RV Percent Paced: 0.04 %
Date Time Interrogation Session: 20170712135405
HighPow Impedance: 66 Ohm
Implantable Lead Implant Date: 20170330
Implantable Lead Location: 753860
Lead Channel Impedance Value: 361 Ohm
Lead Channel Impedance Value: 418 Ohm
Lead Channel Pacing Threshold Amplitude: 1.25 V
Lead Channel Pacing Threshold Pulse Width: 0.4 ms
Lead Channel Sensing Intrinsic Amplitude: 20.75 mV
Lead Channel Sensing Intrinsic Amplitude: 23.25 mV
Lead Channel Setting Pacing Amplitude: 2.5 V
Lead Channel Setting Pacing Pulse Width: 0.4 ms
Lead Channel Setting Sensing Sensitivity: 0.3 mV

## 2016-05-24 MED ORDER — CARVEDILOL 3.125 MG PO TABS: 3.1250 mg | ORAL_TABLET | Freq: Two times a day (BID) | ORAL | Status: DC

## 2016-05-24 NOTE — Patient Instructions (Addendum)
Medication Instructions:  Your physician has recommended you make the following change in your medication:  1) DECREASE Carvedilol to 3.125 mg twice a day  Labwork: None ordered  Testing/Procedures: None ordered  Follow-Up: Remote monitoring is used to monitor your Pacemaker of ICD from home. This monitoring reduces the number of office visits required to check your device to one time per year. It allows [place] to keep an eye on the functioning of your device to ensure it is working properly. You are scheduled for a device check from home on 08/23/2016. You may send your transmission at any time that day. If you have a wireless device, the transmission will be sent automatically. After your physician reviews your transmission, you will receive a postcard with your next transmission date.  Your physician wants you to follow-up in: 6 months with Dr. Curt Bears. You will receive a reminder letter in the mail two months in advance. If you don't receive a letter, please call our office to schedule the follow-up appointment.  If you need a refill on your cardiac medications before your next appointment, please call your pharmacy.  Thank you for choosing [facility]!!   Trinidad Curet, RN [phone]

## 2016-05-27 ENCOUNTER — Encounter (HOSPITAL_COMMUNITY): Payer: Self-pay | Admitting: Emergency Medicine

## 2016-05-27 ENCOUNTER — Ambulatory Visit (HOSPITAL_COMMUNITY)
Admission: EM | Admit: 2016-05-27 | Discharge: 2016-05-27 | Disposition: A | Discharge status: Nursing Home (Includes ICF) | Payer: Medicare HMO | Attending: Emergency Medicine | Admitting: Emergency Medicine

## 2016-05-27 ENCOUNTER — Emergency Department (HOSPITAL_COMMUNITY): Payer: Medicare HMO

## 2016-05-27 ENCOUNTER — Other Ambulatory Visit: Payer: Self-pay

## 2016-05-27 ENCOUNTER — Emergency Department (HOSPITAL_COMMUNITY)
Admission: EM | Admit: 2016-05-27 | Discharge: 2016-05-28 | Disposition: A | Discharge status: Home / Self Care | Payer: Medicare HMO | Attending: Emergency Medicine | Admitting: Emergency Medicine

## 2016-05-27 DIAGNOSIS — Z79899 Other long term (current) drug therapy: Secondary | ICD-10-CM | POA: Insufficient documentation

## 2016-05-27 DIAGNOSIS — J40 Bronchitis, not specified as acute or chronic: Secondary | ICD-10-CM

## 2016-05-27 DIAGNOSIS — I110 Hypertensive heart disease with heart failure: Secondary | ICD-10-CM | POA: Diagnosis not present

## 2016-05-27 DIAGNOSIS — E119 Type 2 diabetes mellitus without complications: Secondary | ICD-10-CM | POA: Insufficient documentation

## 2016-05-27 DIAGNOSIS — J209 Acute bronchitis, unspecified: Secondary | ICD-10-CM | POA: Insufficient documentation

## 2016-05-27 DIAGNOSIS — F17210 Nicotine dependence, cigarettes, uncomplicated: Secondary | ICD-10-CM | POA: Insufficient documentation

## 2016-05-27 DIAGNOSIS — R0602 Shortness of breath: Secondary | ICD-10-CM

## 2016-05-27 DIAGNOSIS — I5041 Acute combined systolic (congestive) and diastolic (congestive) heart failure: Secondary | ICD-10-CM | POA: Insufficient documentation

## 2016-05-27 DIAGNOSIS — R9431 Abnormal electrocardiogram [ECG] [EKG]: Secondary | ICD-10-CM | POA: Diagnosis not present

## 2016-05-27 LAB — CBC WITH DIFFERENTIAL/PLATELET
Basophils Absolute: 0 10*3/uL (ref 0.0–0.1)
Basophils Relative: 0 %
Eosinophils Absolute: 0.1 10*3/uL (ref 0.0–0.7)
Eosinophils Relative: 1 %
HCT: 38.8 % (ref 36.0–46.0)
Hemoglobin: 13.1 g/dL (ref 12.0–15.0)
Lymphocytes Relative: 53 %
Lymphs Abs: 3.8 10*3/uL (ref 0.7–4.0)
MCH: 28.8 pg (ref 26.0–34.0)
MCHC: 33.8 g/dL (ref 30.0–36.0)
MCV: 85.3 fL (ref 78.0–100.0)
Monocytes Absolute: 0.6 10*3/uL (ref 0.1–1.0)
Monocytes Relative: 9 %
Neutro Abs: 2.6 10*3/uL (ref 1.7–7.7)
Neutrophils Relative %: 37 %
Platelets: 249 10*3/uL (ref 150–400)
RBC: 4.55 MIL/uL (ref 3.87–5.11)
RDW: 14.8 % (ref 11.5–15.5)
WBC: 7.2 10*3/uL (ref 4.0–10.5)

## 2016-05-27 LAB — I-STAT CHEM 8, ED
BUN: 10 mg/dL (ref 6–20)
Calcium, Ion: 1.18 mmol/L (ref 1.12–1.23)
Chloride: 110 mmol/L (ref 101–111)
Creatinine, Ser: 0.8 mg/dL (ref 0.44–1.00)
Glucose, Bld: 93 mg/dL (ref 65–99)
HCT: 40 % (ref 36.0–46.0)
Hemoglobin: 13.6 g/dL (ref 12.0–15.0)
Potassium: 3.7 mmol/L (ref 3.5–5.1)
Sodium: 142 mmol/L (ref 135–145)
TCO2: 21 mmol/L (ref 0–100)

## 2016-05-27 LAB — I-STAT TROPONIN, ED: Troponin i, poc: 0.02 ng/mL (ref 0.00–0.08)

## 2016-05-27 MED ORDER — FLUCONAZOLE 200 MG PO TABS: 200.0000 mg | ORAL_TABLET | Freq: Every day | ORAL | Status: DC

## 2016-05-27 MED ORDER — IPRATROPIUM-ALBUTEROL 0.5-2.5 (3) MG/3ML IN SOLN
3.0000 mL | Freq: Once | RESPIRATORY_TRACT | Status: AC
  Administered 2016-05-27: 3 mL via RESPIRATORY_TRACT

## 2016-05-27 MED ORDER — IPRATROPIUM-ALBUTEROL 0.5-2.5 (3) MG/3ML IN SOLN: 3.0000 mL | Freq: Once | RESPIRATORY_TRACT | Status: DC

## 2016-05-27 MED ORDER — ASPIRIN 81 MG PO CHEW
CHEWABLE_TABLET | ORAL | Status: AC
  Filled 2016-05-27: qty 4

## 2016-05-27 MED ORDER — AMOXICILLIN 500 MG PO CAPS: 500.0000 mg | ORAL_CAPSULE | Freq: Three times a day (TID) | ORAL | Status: DC

## 2016-05-27 MED ORDER — ALBUTEROL SULFATE HFA 108 (90 BASE) MCG/ACT IN AERS
2.0000 | INHALATION_SPRAY | RESPIRATORY_TRACT | Status: DC | PRN
  Administered 2016-05-27: 2 via RESPIRATORY_TRACT
  Filled 2016-05-27: qty 6.7

## 2016-05-27 MED ORDER — SODIUM CHLORIDE 0.9 % IV SOLN
Freq: Once | INTRAVENOUS | Status: AC
  Administered 2016-05-27: 20:00:00 via INTRAVENOUS

## 2016-05-27 MED ORDER — ASPIRIN 81 MG PO CHEW
324.0000 mg | CHEWABLE_TABLET | Freq: Once | ORAL | Status: AC
  Administered 2016-05-27: 324 mg via ORAL

## 2016-05-27 MED ORDER — IPRATROPIUM-ALBUTEROL 0.5-2.5 (3) MG/3ML IN SOLN
RESPIRATORY_TRACT | Status: AC
  Filled 2016-05-27: qty 3

## 2016-05-27 NOTE — ED Notes (Signed)
Coreg decreased this week

## 2016-05-27 NOTE — ED Notes (Signed)
Notified [facility] 

## 2016-05-27 NOTE — ED Notes (Addendum)
Patient has long history of cardiac as well as sob.  Patient reports she has congestion in chest, inflammation.  Reports she is often too tight once she gets to help and doctors unable to hear congestion.  Patient reports multiple environmental concerns that she easily reacts to.  Denies pain.  Patient is having pressure in chest.  Reports pressure has not gone away since Wednesday.  Reports pressure and breathing difficulty occurs at the same time.  Patient has felt lightheaded.  Denies swelling. Patient is repetitively saying pressure is related to difficulty breathing not heart

## 2016-05-27 NOTE — ED Notes (Signed)
Pt presents as a transfer from [facility] with [facility]; [facility] reports that pt was seen at [facility] for SOB since Wednesday and was given a breathing treatment; pt reports she is breathing easier and able to cough up yellow colored phlem;

## 2016-05-27 NOTE — Discharge Instructions (Signed)
Follow-up with your family doctor next week

## 2016-05-27 NOTE — ED Provider Notes (Signed)
CSN: [id number]     Arrival date & time 05/27/16  2109 History   First MD Initiated Contact with Patient 05/27/16 2125     Chief Complaint  Patient presents with  . Shortness of Breath     (Consider location/radiation/quality/duration/timing/severity/associated sxs/prior Treatment) Patient is a 75 y.o. female presenting with shortness of breath. The history is provided by the patient (Patient complains of cough yellow sputum production and some wheezing).  Shortness of Breath Onset quality:  Gradual Timing:  Intermittent Progression:  Waxing and waning Chronicity:  New Context: not activity   Associated symptoms: cough   Associated symptoms: no abdominal pain, no chest pain, no headaches and no rash     Past Medical History  Diagnosis Date  . Allergy   . Depression   . Diabetes mellitus without complication ([place])   . Hypertension   . Seizures ([place])   . Thyroid disease   . Heart failure, acute, systolic and diastolic ([place])   . Shortness of breath dyspnea    Past Surgical History  Procedure Laterality Date  . Abdominal hysterectomy    . Tonsillectomy    . Cardiac catheterization  02/10/2016    Procedure: Right/Left Heart Cath and Coronary Angiography;  Surgeon: Leonie Man, MD;  Location: [facility];  Service: Cardiovascular;;  . Ep implantable device N/A 02/10/2016    Procedure: ICD Implant;  Surgeon: Will Meredith Leeds, MD;  Location: [facility];  Service: Cardiovascular;  Laterality: N/A;   Family History  Problem Relation Age of Onset  . Diabetes Mother   . Stroke Mother   . Diabetes Brother   . Diabetes Maternal Grandmother   . Diabetes Paternal Grandfather   . Hypertension Brother   . Heart attack Neg Hx   . Heart failure Paternal Grandfather    Social History  Substance Use Topics  . Smoking status: Current Every Day Smoker -- 1.00 packs/day for 50 years    Types: Cigarettes  . Smokeless tobacco: Never Used  . Alcohol Use: No   OB  History    No data available     Review of Systems  Constitutional: Negative for appetite change and fatigue.  HENT: Negative for congestion, ear discharge and sinus pressure.   Eyes: Negative for discharge.  Respiratory: Positive for cough and shortness of breath.   Cardiovascular: Negative for chest pain.  Gastrointestinal: Negative for abdominal pain and diarrhea.  Genitourinary: Negative for frequency and hematuria.  Musculoskeletal: Negative for back pain.  Skin: Negative for rash.  Neurological: Negative for seizures and headaches.  Psychiatric/Behavioral: Negative for hallucinations.      Allergies  Contrast media; Dust mite extract; Molds & smuts; and Purell instant hand  Home Medications   Prior to Admission medications   Medication Sig Start Date End Date Taking? Authorizing Provider  carvedilol (COREG) 3.125 MG tablet Take 1 tablet (3.125 mg total) by mouth 2 (two) times daily with a meal. 05/24/16  Yes Will Meredith Leeds, MD  fluticasone (FLONASE SENSIMIST) 27.5 MCG/SPRAY nasal spray Place 2 sprays into the nose daily as needed for rhinitis.   Yes Historical Provider, MD  isosorbide mononitrate (IMDUR) 30 MG 24 hr tablet Take 1 tablet (30 mg total) by mouth daily. 02/25/16  Yes Renee Dyane Dustman, PA-C  levETIRAcetam (KEPPRA) 500 MG tablet Take 500 mg by mouth at bedtime.   Yes Historical Provider, MD  lisinopril (PRINIVIL,ZESTRIL) 20 MG tablet Take 1 tablet (20 mg total) by mouth 2 (two) times daily.  02/12/16  Yes Brett Canales, PA-C  nitroGLYCERIN (NITROSTAT) 0.4 MG SL tablet Place 1 tablet (0.4 mg total) under the tongue every 5 (five) minutes x 3 doses as needed for chest pain. 02/12/16  Yes Brett Canales, PA-C  sotalol (BETAPACE) 80 MG tablet Take 1 tablet (80 mg total) by mouth 2 (two) times daily. 02/25/16  Yes Renee Dyane Dustman, PA-C  amoxicillin (AMOXIL) 500 MG capsule Take 1 capsule (500 mg total) by mouth 3 (three) times daily. 05/27/16   Milton Ferguson, MD  fluconazole  (DIFLUCAN) 200 MG tablet Take 1 tablet (200 mg total) by mouth daily. 05/27/16   Milton Ferguson, MD   BP 187/92 mmHg  Pulse 56  Resp 9  SpO2 100% Physical Exam  Constitutional: She is oriented to person, place, and time. She appears well-developed.  HENT:  Head: Normocephalic.  Eyes: Conjunctivae and EOM are normal. No scleral icterus.  Neck: Neck supple. No thyromegaly present.  Cardiovascular: Normal rate and regular rhythm.  Exam reveals no gallop and no friction rub.   No murmur heard. Pulmonary/Chest: No stridor. She has no wheezes. She has no rales. She exhibits no tenderness.  Abdominal: She exhibits no distension. There is no tenderness. There is no rebound.  Musculoskeletal: Normal range of motion. She exhibits no edema.  Lymphadenopathy:    She has no cervical adenopathy.  Neurological: She is oriented to person, place, and time. She exhibits normal muscle tone. Coordination normal.  Skin: No rash noted. No erythema.  Psychiatric: She has a normal mood and affect. Her behavior is normal.    ED Course  Procedures (including critical care time) Labs Review Labs Reviewed  CBC WITH DIFFERENTIAL/PLATELET  I-STAT TROPOININ, ED  I-STAT CHEM 8, ED    Imaging Review Dg Chest 2 View  05/27/2016  CLINICAL DATA:  CHEST TIGHTNESS AND SOB TODAY, HTN, DM, SMOKER. HX ICD IMPLANT EXAM: CHEST  2 VIEW COMPARISON:  02/11/2016 FINDINGS: Cardiac pacemaker. Normal heart size and pulmonary vascularity. No focal airspace disease or consolidation in the lungs. No blunting of costophrenic angles. No pneumothorax. Mediastinal contours appear intact. IMPRESSION: No active cardiopulmonary disease. Electronically Signed   By: Lucienne Capers M.D.   On: 05/27/2016 23:13   I have personally reviewed and evaluated these images and lab results as part of my medical decision-making.   EKG Interpretation   Date/Time:  Saturday May 27 2016 21:46:13 EDT Ventricular Rate:  57 PR Interval:    QRS  Duration: 120 QT Interval:  439 QTC Calculation: 428 R Axis:   6 Text Interpretation:  Sinus rhythm Nonspecific intraventricular conduction  delay Nonspecific T abnormalities, inferior leads Confirmed by Manan Olmo  MD,  Damere Brandenburg ([id number]) on 05/27/2016 9:49:02 PM      MDM   Final diagnoses:  Bronchitis    Patient with bronchitis symptoms. EKG unchanged from April. Patient will be put on amoxicillin and will follow-up with her doctor next week    Milton Ferguson, MD 05/27/16 2329

## 2016-05-27 NOTE — ED Provider Notes (Signed)
CSN: [id number]     Arrival date & time 05/27/16  1837 History   First MD Initiated Contact with Patient 05/27/16 1912     Chief Complaint  Patient presents with  . Shortness of Breath   (Consider location/radiation/quality/duration/timing/severity/associated sxs/prior Treatment) HPI  She is a 75 year old woman here for evaluation of shortness of breath. She states this started on Monday but cleared up some on Wednesday when she had her doctor's appointment. It has worsened again over the last 2 days. She reports a pressure sensation in her chest that is associated with the shortness of breath and wheezing. She denies any fevers. She states this happens relatively often. She reports being very sensitive to chemicals and smell. She has never had an inhaler. She did recently have a heart attack and had an ICD placed. She saw her cardiologist 2 days ago and everything checked out well. Her Coreg dose was decreased due to bradycardia. She denies any swelling. She would very much like to try a breathing treatment.  Of note, her blood pressure is quite elevated. She states this is typical when she has these breathing episodes. She did take her blood pressure medication today.  Past Medical History  Diagnosis Date  . Allergy   . Depression   . Diabetes mellitus without complication ([place])   . Hypertension   . Seizures ([place])   . Thyroid disease   . Heart failure, acute, systolic and diastolic ([place])   . Shortness of breath dyspnea    Past Surgical History  Procedure Laterality Date  . Abdominal hysterectomy    . Tonsillectomy    . Cardiac catheterization  02/10/2016    Procedure: Right/Left Heart Cath and Coronary Angiography;  Surgeon: Leonie Man, MD;  Location: [facility];  Service: Cardiovascular;;  . Ep implantable device N/A 02/10/2016    Procedure: ICD Implant;  Surgeon: Will Meredith Leeds, MD;  Location: [facility];  Service: Cardiovascular;  Laterality: N/A;    Family History  Problem Relation Age of Onset  . Diabetes Mother   . Stroke Mother   . Diabetes Brother   . Diabetes Maternal Grandmother   . Diabetes Paternal Grandfather   . Hypertension Brother   . Heart attack Neg Hx   . Heart failure Paternal Grandfather    Social History  Substance Use Topics  . Smoking status: Current Every Day Smoker -- 1.00 packs/day for 50 years    Types: Cigarettes  . Smokeless tobacco: Never Used  . Alcohol Use: No   OB History    No data available     Review of Systems As in history of present illness Allergies  Contrast media; Dust mite extract; and Molds & smuts  Home Medications   Prior to Admission medications   Medication Sig Start Date End Date Taking? Authorizing Provider  carvedilol (COREG) 3.125 MG tablet Take 1 tablet (3.125 mg total) by mouth 2 (two) times daily with a meal. 05/24/16   Will Meredith Leeds, MD  fluticasone (FLONASE SENSIMIST) 27.5 MCG/SPRAY nasal spray Place 2 sprays into the nose daily as needed for rhinitis.    Historical Provider, MD  isosorbide mononitrate (IMDUR) 30 MG 24 hr tablet Take 1 tablet (30 mg total) by mouth daily. 02/25/16   Renee Dyane Dustman, PA-C  levETIRAcetam (KEPPRA) 500 MG tablet Take 500 mg by mouth at bedtime.    Historical Provider, MD  lisinopril (PRINIVIL,ZESTRIL) 20 MG tablet Take 1 tablet (20 mg total) by mouth 2 (  two) times daily. 02/12/16   Brett Canales, PA-C  nitroGLYCERIN (NITROSTAT) 0.4 MG SL tablet Place 1 tablet (0.4 mg total) under the tongue every 5 (five) minutes x 3 doses as needed for chest pain. 02/12/16   Brett Canales, PA-C  sotalol (BETAPACE) 80 MG tablet Take 1 tablet (80 mg total) by mouth 2 (two) times daily. 02/25/16   Renee Dyane Dustman, PA-C   Meds Ordered and Administered this Visit   Medications  ipratropium-albuterol (DUONEB) 0.5-2.5 (3) MG/3ML nebulizer solution 3 mL (3 mLs Nebulization Given 05/27/16 1956)  0.9 %  sodium chloride infusion ( Intravenous New Bag/Given  05/27/16 2028)  aspirin chewable tablet 324 mg (324 mg Oral Given 05/27/16 2033)    BP 199/101 mmHg  Pulse 64  Temp(Src) 98.6 F (37 C) (Oral)  Resp 22  SpO2 100% No data found.   Physical Exam  Constitutional: She is oriented to person, place, and time. She appears well-developed and well-nourished. No distress.  Neck: Neck supple.  Cardiovascular: Normal rate, regular rhythm and normal heart sounds.   No murmur heard. Pulmonary/Chest: Effort normal. No respiratory distress. She has wheezes. She has no rales.  Patient is very forcefully exhaling which is causing a fair amount of transmitted airway noise. There are some faint wheezes bilaterally.  Musculoskeletal: She exhibits no edema or tenderness.  Neurological: She is alert and oriented to person, place, and time.    ED Course  Procedures (including critical care time) ED ECG REPORT   Date: 05/27/2016  Rate: 54  Rhythm: sinus bradycardia  QRS Axis: normal  Intervals: normal  ST/T Wave abnormalities: new t-wave inversions in V3-V5; more prominent inversion in V6  Conduction Disutrbances:none  Narrative Interpretation: sinus bradycardia with new lateral t-wave inversions  Old EKG Reviewed: changes noted  I have personally reviewed the EKG tracing and agree with the computerized printout as noted.  Labs Review Labs Reviewed - No data to display  Imaging Review No results found.    MDM   1. Shortness of breath   2. Abnormal EKG    Patient denies any significant improvement after nebulizer treatment. Lung sounds are stable. She does have a forced wheeze with forceful exhalation, but no wheezing with normal breathing. Reviewed EKG with the patient. With new T-wave inversions from 2 days ago, will transfer to [facility] via [facility] for additional workup and evaluation. Aspirin given prior to transfer.    [name], MD 05/27/16 2035

## 2016-05-31 DIAGNOSIS — I5042 Chronic combined systolic (congestive) and diastolic (congestive) heart failure: Secondary | ICD-10-CM | POA: Insufficient documentation

## 2016-05-31 DIAGNOSIS — I2510 Atherosclerotic heart disease of native coronary artery without angina pectoris: Secondary | ICD-10-CM | POA: Insufficient documentation

## 2016-05-31 DIAGNOSIS — I5022 Chronic systolic (congestive) heart failure: Secondary | ICD-10-CM | POA: Insufficient documentation

## 2016-05-31 NOTE — Progress Notes (Signed)
Cardiology Office Note    Date:  06/01/2016   ID:  [facility], DOB 02/19/1941, MRN [id number]  PCP:  [name], MD  Cardiologist: Sinclair Grooms, MD   Chief Complaint  Patient presents with  . Coronary Artery Disease    History of Present Illness:  [facility] is a 75 y.o. female f/u CAD with occluded RCA, VT, ICD, sotalol therapy for VT suppression, hypertension, LV systolic dysfunction.  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 Has not been active and denies orthopnea. Still c/o sudden onset dyspnea and rhonchi. Prior h/o smoking.  It is frustrating having conversations with her because there is a lot of extraneous information that is exchanged based upon her interpretation of symptoms and beliefs that she has a significant underlying lung problem/asthma.  Past Medical History  Diagnosis Date  . Allergy   . Depression   . Diabetes mellitus without complication ([place])   . Hypertension   . Seizures ([place])   . Thyroid disease   . Heart failure, acute, systolic and diastolic ([place])   . Shortness of breath dyspnea     Past Surgical History  Procedure Laterality Date  . Abdominal hysterectomy    . Tonsillectomy    . Cardiac catheterization  02/10/2016    Procedure: Right/Left Heart Cath and Coronary Angiography;  Surgeon: Leonie Man, MD;  Location: [facility];  Service: Cardiovascular;;  . Ep implantable device N/A 02/10/2016    Procedure: ICD Implant;  Surgeon: Will Meredith Leeds, MD;  Location: [facility];  Service: Cardiovascular;  Laterality: N/A;    Current Medications: Outpatient Prescriptions Prior to Visit  Medication Sig Dispense Refill  . amoxicillin (AMOXIL) 500 MG capsule Take 1 capsule (500 mg total) by mouth 3 (three) times daily. 21 capsule 0  . carvedilol (COREG) 3.125 MG tablet Take 1 tablet (3.125 mg total) by mouth 2 (two) times daily with a meal. 60 tablet 6  . fluconazole (DIFLUCAN) 200 MG tablet Take 1 tablet  (200 mg total) by mouth daily. 7 tablet 0  . fluticasone (FLONASE SENSIMIST) 27.5 MCG/SPRAY nasal spray Place 2 sprays into the nose daily as needed for rhinitis.    [name] isosorbide mononitrate (IMDUR) 30 MG 24 hr tablet Take 1 tablet (30 mg total) by mouth daily. 30 tablet 6  . levETIRAcetam (KEPPRA) 500 MG tablet Take 500 mg by mouth at bedtime.    [name] lisinopril (PRINIVIL,ZESTRIL) 20 MG tablet Take 1 tablet (20 mg total) by mouth 2 (two) times daily. 30 tablet 11  . nitroGLYCERIN (NITROSTAT) 0.4 MG SL tablet Place 1 tablet (0.4 mg total) under the tongue every 5 (five) minutes x 3 doses as needed for chest pain. 25 tablet 12  . sotalol (BETAPACE) 80 MG tablet Take 1 tablet (80 mg total) by mouth 2 (two) times daily. 60 tablet 6   No facility-administered medications prior to visit.     Allergies:   Contrast media; Dust mite extract; Molds & smuts; and Purell instant hand   Social History   Social History  . Marital Status: Single    Spouse Name: N/A  . Number of Children: N/A  . Years of Education: N/A   Social History Main Topics  . Smoking status: Current Every Day Smoker -- 1.00 packs/day for 50 years    Types: Cigarettes  . Smokeless tobacco: Never Used  . Alcohol Use: No  . Drug Use: No  . Sexual Activity: No  Other Topics Concern  . None   Social History Narrative     Family History:  The patient's family history includes Diabetes in her brother, maternal grandmother, mother, and paternal grandfather; Heart failure in her paternal grandfather; Hypertension in her brother; Stroke in her mother. There is no history of Heart attack.   ROS:   Please see the history of present illness.    Cough, phlegm production, nausea, vomiting, excessive fatigue. Concerned that ACE inhibitor therapy is causing increased phlegm and shortness of breath. Concerned that she may have angioedema.  All other systems reviewed and are negative.   PHYSICAL EXAM:   VS:  BP 144/86 mmHg  Pulse 64   Ht 5' 2"  (1.575 m)  Wt 143 lb (64.864 kg)  BMI 26.15 kg/m2   GEN: Well nourished, well developed, in no acute distress HEENT: normal Neck: no JVD, carotid bruits, or masses Cardiac: RRR; no murmurs, rubs, or gallops,no edema  Respiratory:  clear to auscultation bilaterally, normal work of breathing GI: soft, nontender, nondistended, + BS MS: no deformity or atrophy Skin: warm and dry, no rash Neuro:  Alert and Oriented x 3, Strength and sensation are intact Psych: euthymic mood, full affect  Wt Readings from Last 3 Encounters:  06/01/16 143 lb (64.864 kg)  05/24/16 144 lb 1.9 oz (65.372 kg)  03/02/16 148 lb 12.8 oz (67.495 kg)      Studies/Labs Reviewed:   EKG:  EKG  Not performed  Recent Labs: 02/07/2016: B Natriuretic Peptide 1646.8*; TSH 0.184* 05/09/2016: Magnesium 1.9 05/27/2016: BUN 10; Creatinine, Ser 0.80; Hemoglobin 13.6; Platelets 249; Potassium 3.7; Sodium 142   Lipid Panel No results found for: CHOL, TRIG, HDL, CHOLHDL, VLDL, LDLCALC, LDLDIRECT  Additional studies/ records that were reviewed today include:  No new or recent data. Coronary angiogram, 01/2016:         Echocardiogram 01/2016: Study Conclusions  - Left ventricle: The cavity size was moderately to severely  dilated. There was mild concentric hypertrophy. Systolic function  was mildly to moderately reduced. The estimated ejection fraction  was in the range of 40% to 45%. Mild hypokinesis of the inferior  and inferoseptal myocardium. Features are consistent with a  pseudonormal left ventricular filling pattern, with concomitant  abnormal relaxation and increased filling pressure (grade 2  diastolic dysfunction). Doppler parameters are consistent with  elevated mean left atrial filling pressure. - Aortic valve: There was no stenosis. There was no regurgitation. - Mitral valve: Mobility of the anterior and posterior leaflet was  restricted. There was mild regurgitation. Effective  regurgitant  orifice (PISA): 0.12 cm^2. - Left atrium: The atrium was severely dilated. - Right ventricle: The cavity size was normal. Wall thickness was  normal. Systolic function was normal. - Pulmonary arteries: Systolic pressure was mildly increased. PA  peak pressure: 44 mm Hg (S). - Inferior vena cava: The vessel was dilated. The respirophasic  diameter changes were in the normal range (>= 50%), consistent  with elevated central venous pressure. - Pericardium, extracardiac: There was a left pleural effusion.   ASSESSMENT:    1. VT (ventricular tachycardia) ([place])   2. Essential hypertension   3. CAD in native artery   4. Chronic combined systolic and diastolic congestive heart failure ([place])   5. Tobacco abuse   6. Dyspnea      PLAN:  In order of problems listed above:  1. Currently being managed with an implanted cardiac defibrillator and oral sotalol. 2. Blood pressure control is not quite as good  as I would like but this is due to continuous complaints by the patient concerning possible side effects. 3. Needs to be on baby aspirin, antilipid therapy, and aggressive blood pressure control to prevent progression. I discussed this in great detail with the patient. 4. There is no evidence of volume overload. I am concerned that, bulb beta blocker therapy and ACE inhibitor therapy has been decreased in intensity because of multiple complaints and associations claimed by the patient. 5. Recurring episodes of dyspnea which the patient feels is related to asthma. We will ask for formal pulmonary consultation to help sort out if there is a reactive airways disease problem or COPD present in this prior smoker.    Medication Adjustments/Labs and Tests Ordered: Current medicines are reviewed at length with the patient today.  Concerns regarding medicines are outlined above.  Medication changes, Labs and Tests ordered today are listed in the Patient Instructions below. Patient  Instructions  Medication Instructions:    Labwork: None ordered  Testing/Procedures: None ordered  Follow-Up: You have been referred to Pulmonology  Your physician wants you to follow-up in:  6 months with Dr.Doyle Kunath You will receive a reminder letter in the mail two months in advance. If you don't receive a letter, please call our office to schedule the follow-up appointment.   Any Other Special Instructions Will Be Listed Below (If Applicable).     If you need a refill on your cardiac medications before your next appointment, please call your pharmacy.       Signed, Sinclair Grooms, MD  06/01/2016 1:01 PM    [facility] [address], [place], [place]  [address] Phone: [phone]; Fax: [phone]

## 2016-06-01 ENCOUNTER — Ambulatory Visit (INDEPENDENT_AMBULATORY_CARE_PROVIDER_SITE_OTHER): Payer: Medicare HMO | Admitting: Interventional Cardiology

## 2016-06-01 ENCOUNTER — Encounter: Payer: Self-pay | Admitting: Interventional Cardiology

## 2016-06-01 VITALS — BP 144/86 | HR 64 | Ht 62.0 in | Wt 143.0 lb

## 2016-06-01 DIAGNOSIS — I10 Essential (primary) hypertension: Secondary | ICD-10-CM | POA: Diagnosis not present

## 2016-06-01 DIAGNOSIS — I2510 Atherosclerotic heart disease of native coronary artery without angina pectoris: Secondary | ICD-10-CM

## 2016-06-01 DIAGNOSIS — I5042 Chronic combined systolic (congestive) and diastolic (congestive) heart failure: Secondary | ICD-10-CM

## 2016-06-01 DIAGNOSIS — I472 Ventricular tachycardia, unspecified: Secondary | ICD-10-CM

## 2016-06-01 DIAGNOSIS — Z720 Tobacco use: Secondary | ICD-10-CM

## 2016-06-01 DIAGNOSIS — R0600 Dyspnea, unspecified: Secondary | ICD-10-CM

## 2016-06-01 NOTE — Patient Instructions (Addendum)
Medication Instructions:    Labwork: None ordered  Testing/Procedures: None ordered  Follow-Up: You have been referred to Pulmonology  Your physician wants you to follow-up in:  6 months with Dr.Smith You will receive a reminder letter in the mail two months in advance. If you don't receive a letter, please call our office to schedule the follow-up appointment.   Any Other Special Instructions Will Be Listed Below (If Applicable).     If you need a refill on your cardiac medications before your next appointment, please call your pharmacy.

## 2016-06-08 ENCOUNTER — Encounter: Payer: Self-pay | Admitting: Internal Medicine

## 2016-06-08 ENCOUNTER — Ambulatory Visit (INDEPENDENT_AMBULATORY_CARE_PROVIDER_SITE_OTHER): Payer: Medicare HMO | Admitting: Internal Medicine

## 2016-06-08 VITALS — BP 134/76 | HR 67 | Ht 61.5 in | Wt 141.0 lb

## 2016-06-08 DIAGNOSIS — R0602 Shortness of breath: Secondary | ICD-10-CM | POA: Diagnosis not present

## 2016-06-08 DIAGNOSIS — R05 Cough: Secondary | ICD-10-CM | POA: Diagnosis not present

## 2016-06-08 DIAGNOSIS — R058 Other specified cough: Secondary | ICD-10-CM

## 2016-06-08 DIAGNOSIS — F17210 Nicotine dependence, cigarettes, uncomplicated: Secondary | ICD-10-CM

## 2016-06-08 LAB — NITRIC OXIDE: Nitric Oxide: 7

## 2016-06-08 MED ORDER — PANTOPRAZOLE SODIUM 40 MG PO TBEC: 40.0000 mg | DELAYED_RELEASE_TABLET | Freq: Every day | ORAL | 2 refills | Status: DC

## 2016-06-08 MED ORDER — FAMOTIDINE 20 MG PO TABS: ORAL_TABLET | ORAL | 11 refills | Status: DC

## 2016-06-08 MED ORDER — METHYLPREDNISOLONE ACETATE 80 MG/ML IJ SUSP
120.0000 mg | Freq: Once | INTRAMUSCULAR | Status: AC
Start: 2016-06-08 — End: 2016-06-08
  Administered 2016-06-08: 120 mg via INTRAMUSCULAR

## 2016-06-08 NOTE — Assessment & Plan Note (Addendum)
- Left ventricle: The cavity size was moderately to severely   dilated. There was mild concentric hypertrophy. Systolic function   was mildly to moderately reduced. The estimated ejection fraction   was in the range of 40% to 45%. Mild hypokinesis of the inferior   and inferoseptal myocardium. Features are consistent with a   pseudonormal left ventricular filling pattern, with concomitant   abnormal relaxation and increased filling pressure (grade 2   diastolic dysfunction). Doppler parameters are consistent with   elevated mean left atrial filling pressure. - Aortic valve: There was no stenosis. There was no regurgitation. - Mitral valve: Mobility of the anterior and posterior leaflet was   restricted. There was mild regurgitation. Effective regurgitant   orifice (PISA): 0.12 cm^2. - Left atrium: The atrium was severely dilated. - Right ventricle: The cavity size was normal. Wall thickness was   normal. Systolic function was normal. - Pulmonary arteries: Systolic pressure was mildly increased. PA   peak pressure: 44 mm Hg (S). - Inferior vena cava: The vessel was dilated. The respirophasic   diameter changes were in the normal range (>= 50%), consistent   with elevated central venous pressure. - Pericardium, extracardiac: There was a left pleural effusion. Spirometry 06/08/2016  wnl though there is slt curvature to the f/v loop on coreg and sotelol - trial of max gerd RX 06/08/2016    Very unusual going all the way back to childhood with poor ex tol and tendency to cough (see separate a/p)  With Symptoms are markedly disproportionate to objective findings and not clear this is a lung problem but pt does appear to have difficult airway management issues.    DDX of  difficult airways management almost all start with A and  include Adherence, Ace Inhibitors, Acid Reflux, Active Sinus Disease, Alpha 1 Antitripsin deficiency, Anxiety masquerading as Airways dz,  ABPA,  Allergy(esp in young),  Aspiration (esp in elderly), Adverse effects of meds,  Active smokers, A bunch of PE's (a small clot burden can't cause this syndrome unless there is already severe underlying pulm or vascular dz with poor reserve) plus two Bs  = Bronchiectasis and Beta blocker use..and one C= CHF  Adherence is always the initial "prime suspect" and is a multilayered concern that requires a "trust but verify" approach in every patient - starting with knowing how to use medications, especially inhalers, correctly, keeping up with refills and understanding the fundamental difference between maintenance and prns vs those medications only taken for a very short course and then stopped and not refilled.   ACEi intol > off x one week, intolerance strongly supports dx of uacs (see separate a/p)   Active smoking (see separate a/p)   ? Acid (or non-acid) GERD > always difficult to exclude as up to 75% of pts in some series report no assoc GI/ Heartburn symptoms> rec max (24h)  acid suppression and diet restrictions/ reviewed and instructions given in writing.   ? Anxiety > usually at the bottom of this list of usual suspects but should be much higher on this pt's based on H and P and note dependency on alternative rx  ? Allergy /asthma > depo120 IM > declines additional testing "done in [place] already and not helpful"    ? BB effects > [facility] does have mild inflection to f/v curve but not true wheeze today > ok to continue but would favor avoiding two non-specific BB if possible: Strongly prefer in this setting: Bystolic, the most beta -1  selective Beta blocker available in sample form, with bisoprolol the most selective generic choice  on the market.   Will try max rx for gerd first/ revisit issue of BB next visit  Total time devoted to counseling  = 35/23mreview case with pt/ discussion of options/alternatives/ personally creating written instructions  in presence of pt  then going over those specific  Instructions directly  with the pt including how to use all of the meds but in particular covering each new medication in detail and the difference between the maintenance/automatic meds and the prns using an action plan format for the latter.

## 2016-06-08 NOTE — Progress Notes (Signed)
Subjective:     Patient ID: [facility], female   DOB: January 03, 1941, 75 y.o.   MRN: [id number]  HPI  86 yobf active smoker with problems with stuffy nose as child "grew up in moldy house" and poor ex tol as teenage finished near the end of a group and difficulty blowing on instructions and  Cough ever since college except when she was pregnant  But w/in 6 weeks p deliverey  back to same pattern and referred to pulmonary clinic 06/08/2016 by Dr Daneen Schick p d/c ACEi  06/01/16    06/08/2016 1st [facility] office visit/ Gwenlyn Hottinger   Chief Complaint  Patient presents with  . Pulmonary Consult    Referred by Dr. Pernell Dupre.  Pt c/o Dyspnea and "lots of mucus" "spasms in throat" since April 2017.   cough x ever since college much worse since starting ACEi in late march 2017 and better since stopped one week prior to OV  "getting back to normal self now" and Not limited by breathing from desired activities  But still sense of sob at noct with tightness/ generalized  Lives in rental house/ elevated sev feet off the ground  X sev years Goes to "allergist" in [place] says Pos dust / mold" has med for under tongue but didn't help her.   Every night since d/c sleeps about 45 degrees afraid to try to lie flat due to 2-3/7 nights has trouble getting to sleep due to sob/chest tight but once gets to sleep does ok s premature awakening   No trouble with  doe proportionate to activity  No better with inhaler / worse with cleaning chemical exp or dust / better with spleen extract   No obvious day to day or daytime variability or assoc excess/ purulent sputum or mucus plugs or hemoptysis or cp or  subjective wheeze or overt hb symptoms. No unusual exp hx or h/o childhood pna/ asthma or knowledge of premature birth.  Sleeping ok without nocturnal  or early am exacerbation  of respiratory  c/o's or need for noct saba. Also denies any obvious fluctuation of symptoms with weather or environmental changes or other  aggravating or alleviating factors except as outlined above   Current Medications, Allergies, Complete Past Medical History, Past Surgical History, Family History, and Social History were reviewed in [employer] record.  ROS  The following are not active complaints unless bolded sore throat, dysphagia, dental problems, itching, sneezing,  nasal congestion or excess/ purulent secretions, ear ache,   fever, chills, sweats, unintended wt loss, classically pleuritic or exertional cp,  orthopnea pnd or leg swelling, presyncope, palpitations, abdominal pain, anorexia, nausea, vomiting, diarrhea  or change in bowel or bladder habits, change in stools or urine, dysuria,hematuria,  rash, arthralgias, visual complaints, headache, numbness, weakness or ataxia or problems with walking or coordination,  change in mood/affect or memory.                      Review of Systems     Objective:   Physical Exam    amb bf nad never coughed for the hour she was in office / occ throat clearing   Wt Readings from Last 3 Encounters:  06/08/16 141 lb (64 kg)  06/01/16 143 lb (64.9 kg)  05/24/16 144 lb 1.9 oz (65.4 kg)    Vital signs reviewed  HEENT: no upper teeth/ nl  turbinates, and oropharynx. Nl external ear canals without cough reflex   NECK :  without JVD/Nodes/TM/ nl carotid upstrokes bilaterally  LUNGS: no acc muscle use,  Mod T kyphosis / distant bs with pseudowheeze only, resolves with plm   CV:  RRR  no s3 or murmur or increase in P2, no edema   ABD:  soft and nontender with nl inspiratory excursion in the supine position. No bruits or organomegaly, bowel sounds nl  MS:  Nl gait/ ext warm without deformities, calf tenderness, cyanosis or clubbing No obvious joint restrictions   SKIN: warm and dry without lesions    NEURO:  alert, approp, nl sensorium with  no motor deficits    I personally reviewed images and agree with radiology impression as follows:  CXR:   05/27/16 Cardiac pacemaker. Normal heart size and pulmonary vascularity. No focal airspace disease or consolidation in the lungs. No blunting of costophrenic angles. No pneumothorax. Mediastinal contours appear Intact.   Cbc with diff 05/27/16  Eos 0.1     Assessment:

## 2016-06-08 NOTE — Patient Instructions (Addendum)
Pantoprazole (protonix) 40 mg   Take  30-60 min before first meal of the day and Pepcid (famotidine)  20 mg one @  bedtime until return to office - this is the best way to tell whether stomach acid is contributing to your problem.    GERD (REFLUX)  is an extremely common cause of respiratory symptoms just like yours , many times with no obvious heartburn at all.    It can be treated with medication, but also with lifestyle changes including elevation of the head of your bed (ideally with 6 inch  bed blocks),  Smoking cessation, avoidance of late meals, excessive alcohol, and avoid fatty foods, chocolate, peppermint, colas, red wine, and acidic juices such as orange juice.  NO MINT OR MENTHOL PRODUCTS SO NO COUGH DROPS   USE SUGARLESS CANDY INSTEAD (Jolley ranchers or Stover's or Life Savers) or even ice chips will also do - the key is to swallow to prevent all throat clearing. NO OIL BASED VITAMINS - use powdered substitutes.    Please schedule a follow up office visit in 4 weeks, sooner if needed

## 2016-06-08 NOTE — Assessment & Plan Note (Signed)

## 2016-06-08 NOTE — Assessment & Plan Note (Addendum)
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   These are the same pts (now being labeled as having "irritable larynx syndrome" by some cough centers) who not infrequently have a history of having failed to tolerate ace inhibitors,  dry powder inhalers or biphosphonates or report having atypical reflux symptoms that don't respond to standard doses of PPI , and are easily confused as having aecopd or asthma flares by even experienced allergists/ pulmonologists.  Try first max rx for gerd then regroup in 4 weeks

## 2016-06-09 ENCOUNTER — Encounter: Payer: Self-pay | Admitting: Internal Medicine

## 2016-06-12 ENCOUNTER — Telehealth: Payer: Self-pay | Admitting: Cardiovascular Disease

## 2016-06-12 NOTE — Telephone Encounter (Signed)
New Message  Pt call requesting to speak with RN about gel from ultra sound. Pt wants to know what gel we use when completing an ultra sound. Pt states she had a reaction to parell gel. Please call back to discuss

## 2016-06-12 NOTE — Telephone Encounter (Signed)
Spoke w/ patient. She had q's about the ultrasound gel and I noted brand of use in our vascular suite is the Aquasonic. She wanted to make sure we knew she has reaction to alcohol based hand sanitizers and I noted that the allergy was already listed but that I would add a note in her chart. Pt voiced thanks. No further needs at this time.

## 2016-06-14 ENCOUNTER — Ambulatory Visit (INDEPENDENT_AMBULATORY_CARE_PROVIDER_SITE_OTHER): Payer: Medicare HMO | Admitting: Cardiovascular Disease

## 2016-06-14 ENCOUNTER — Encounter: Payer: Self-pay | Admitting: Cardiovascular Disease

## 2016-06-14 DIAGNOSIS — I739 Peripheral vascular disease, unspecified: Secondary | ICD-10-CM | POA: Diagnosis not present

## 2016-06-14 NOTE — Assessment & Plan Note (Signed)
Colleen Lowery was referred to me by Dr. Tamala Julian for evaluation of peripheral vascular disease. 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 She did have an ICD implanted because of ventricular tachycardia. Further problems include a history of hypertension, diabetes and hyperlipidemia. She does have lifestyle limiting claudication on the left at 1/2 mile. Lower extremity arterial Doppler studies performed in our office 05/09/16 revealed an occluded left SFA with a left ABI of 0.66. At this point, she has not feel inclined to pursue revascularization because she doesn't feel that her symptoms are that limiting however in the future she may elect to pursue this. I will see her back as needed.

## 2016-06-14 NOTE — Progress Notes (Signed)
06/14/2016 Colleen Lowery   06-19-1941  [id number]  Primary Physician Colleen Lowery, Colleen Jude, MD Primary Cardiologist: Colleen Harp MD Colleen Lowery  HPI:  Colleen Lowery is a delightful 75 year old mildly overweight widowed African-American female mother of one child, grandmother and one grandchild who is a retired Marine scientist. She was referred by Dr. Tamala Lowery for vascular evaluation because of claudication. She does have a history of CAD with cardiac catheterization performed in March of this year revealing a total RCA. She does have left ventricular dysfunction, ventricular tachycardia on sotalol status post ICD implantation. She continues to smoke one half pack per day and has smoked over 50 pack years. She has a history of hypertension and hyperlipidemia although she has been intolerant and diabetes. She does have left calf claudication with Dopplers performed in our office 05/09/16 revealing a normal right ABI with a left ABI of 0.66 and an occluded left SFA.   Current Outpatient Prescriptions  Medication Sig Dispense Refill  . carvedilol (COREG) 3.125 MG tablet Take 1 tablet (3.125 mg total) by mouth 2 (two) times daily with a meal. 60 tablet 6  . famotidine (PEPCID) 20 MG tablet One at bedtime 30 tablet 11  . fluticasone (FLONASE SENSIMIST) 27.5 MCG/SPRAY nasal spray Place 2 sprays into the nose daily as needed for rhinitis.    [name] isosorbide mononitrate (IMDUR) 30 MG 24 hr tablet Take 1 tablet (30 mg total) by mouth daily. 30 tablet 6  . levETIRAcetam (KEPPRA) 500 MG tablet Take 500 mg by mouth at bedtime.    . nitroGLYCERIN (NITROSTAT) 0.4 MG SL tablet Place 1 tablet (0.4 mg total) under the tongue every 5 (five) minutes x 3 doses as needed for chest pain. 25 tablet 12  . pantoprazole (PROTONIX) 40 MG tablet Take 1 tablet (40 mg total) by mouth daily. Take 30-60 min before first meal of the day 30 tablet 2  . sotalol (BETAPACE) 80 MG tablet Take 1 tablet (80 mg total) by mouth 2 (two) times  daily. 60 tablet 6   No current facility-administered medications for this visit.     Allergies  Allergen Reactions  . Contrast Media [Iodinated Diagnostic Agents] Anaphylaxis    Throat closed, was 40 yrs ago  . Dust Mite Extract Swelling  . Molds & Smuts Swelling  . Purell Instant Hand [Alcohol]     Social History   Social History  . Marital status: Single    Spouse name: N/A  . Number of children: N/A  . Years of education: N/A   Occupational History  . Not on file.   Social History Main Topics  . Smoking status: Current Every Day Smoker    Packs/day: 1.00    Years: 50.00    Types: Cigarettes  . Smokeless tobacco: Never Used  . Alcohol use No  . Drug use: No  . Sexual activity: No   Other Topics Concern  . Not on file   Social History Narrative  . No narrative on file     Review of Systems: General: negative for chills, fever, night sweats or weight changes.  Cardiovascular: negative for chest pain, dyspnea on exertion, edema, orthopnea, palpitations, paroxysmal nocturnal dyspnea or shortness of breath Dermatological: negative for rash Respiratory: negative for cough or wheezing Urologic: negative for hematuria Abdominal: negative for nausea, vomiting, diarrhea, bright red blood per rectum, melena, or hematemesis Neurologic: negative for visual changes, syncope, or dizziness All other systems reviewed and are otherwise negative except as  noted above.    Blood pressure (!) 148/90, pulse 66, height 5' 1"  (1.549 m), weight 142 lb (64.4 kg).  General appearance: alert and no distress Neck: no adenopathy, no carotid bruit, no JVD, supple, symmetrical, trachea midline and thyroid not enlarged, symmetric, no tenderness/mass/nodules Lungs: clear to auscultation bilaterally Heart: regular rate and rhythm, S1, S2 normal, no murmur, click, rub or gallop Extremities: extremities normal, atraumatic, no cyanosis or edema  EKG not performed today  ASSESSMENT AND  PLAN:   Peripheral arterial disease [facility] Colleen Lowery was referred to me by Dr. Tamala Lowery for evaluation of peripheral vascular disease. 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 She did have an ICD implanted because of ventricular tachycardia. Further problems include a history of hypertension, diabetes and hyperlipidemia. She does have lifestyle limiting claudication on the left at 1/2 mile. Lower extremity arterial Doppler studies performed in our office 05/09/16 revealed an occluded left SFA with a left ABI of 0.66. At this point, she has not feel inclined to pursue revascularization because she doesn't feel that her symptoms are that limiting however in the future she may elect to pursue this. I will see her back as needed.      Colleen Harp MD FACP,FACC,FAHA, [facility] 06/14/2016 11:32 AM

## 2016-06-14 NOTE — Patient Instructions (Signed)
Medication Instructions:  Your physician recommends that you continue on your current medications as directed. Please refer to the Current Medication list given to you today.   Follow-Up: Your physician recommends that you schedule a follow-up appointment ON AN AS NEEDED BASIS.  If you need a refill on your cardiac medications before your next appointment, please call your pharmacy.   

## 2016-06-26 ENCOUNTER — Other Ambulatory Visit: Payer: Self-pay | Admitting: Family Medicine

## 2016-06-26 DIAGNOSIS — Z1231 Encounter for screening mammogram for malignant neoplasm of breast: Secondary | ICD-10-CM

## 2016-07-10 ENCOUNTER — Encounter: Payer: Self-pay | Admitting: Internal Medicine

## 2016-07-10 ENCOUNTER — Ambulatory Visit (INDEPENDENT_AMBULATORY_CARE_PROVIDER_SITE_OTHER): Payer: Medicare HMO | Admitting: Internal Medicine

## 2016-07-10 VITALS — BP 150/82 | HR 69 | Ht 61.5 in | Wt 143.6 lb

## 2016-07-10 DIAGNOSIS — R058 Other specified cough: Secondary | ICD-10-CM

## 2016-07-10 DIAGNOSIS — I10 Essential (primary) hypertension: Secondary | ICD-10-CM | POA: Diagnosis not present

## 2016-07-10 DIAGNOSIS — F17210 Nicotine dependence, cigarettes, uncomplicated: Secondary | ICD-10-CM | POA: Diagnosis not present

## 2016-07-10 DIAGNOSIS — R05 Cough: Secondary | ICD-10-CM | POA: Diagnosis not present

## 2016-07-10 NOTE — Progress Notes (Signed)
Subjective:    Patient ID: Colleen Lowery, female   DOB: Sep 22, 1941,     MRN: [id number]    Brief patient profile:  40 yobf active smoker with problems with stuffy nose as child "grew up in moldy house" and poor ex tol as teenage finished near the end of a group and difficulty blowing on instructions and  Cough ever since college except when she was pregnant  But w/in 6 weeks p deliverey  back to same pattern and referred to pulmonary clinic 06/08/2016 by Dr Daneen Schick p d/c ACEi  06/01/16     History of Present Illness  06/08/2016 1st [facility] office visit/ Porsha Skilton   Chief Complaint  Patient presents with  . Pulmonary Consult    Referred by Dr. Pernell Dupre.  Pt c/o Dyspnea and "lots of mucus" "spasms in throat" since April 2017.   cough x ever since college much worse since starting ACEi in late march 2017 and better since stopped one week prior to OV  "getting back to normal self now" and Not limited by breathing from desired activities  But still sense of sob at noct with tightness/ generalized Lives in rental house/ elevated sev feet off the ground  X sev years Goes to "allergist" in [place] says Pos dust / mold" has med for under tongue but didn't help her.  Every night since d/c sleeps about 45 degrees afraid to try to lie flat due to 2-3/7 nights has trouble getting to sleep due to sob/chest tight but once gets to sleep does ok s premature awakening  No trouble with  doe proportionate to activity  No better with inhaler / worse with cleaning chemical exp or dust / better with spleen extract  rec Pantoprazole (protonix) 40 mg   Take  30-60 min before first meal of the day and Pepcid (famotidine)  20 mg one @  bedtime until return to office - this is the best way to tell whether stomach acid is contributing to your problem.   GERD diet    07/10/2016  f/u ov/Fontella Shan re:  ? uacs from acei  Chief Complaint  Patient presents with  . Follow-up    Breathing has improved and she has not noticed  any more "spasms" in her throat.   main focus is intermittent abd swelling/ no longer sob or having choking issues or orthopnea but still very sensitive to smells "even fabrize" which makes her feel "tight" = points to her abdomen.  No obvious day to day or daytime variability or assoc excess/ purulent sputum or mucus plugs or hemoptysis or cp or  subjective wheeze or overt hb symptoms. No unusual exp hx or h/o childhood pna/ asthma or knowledge of premature birth.  Sleeping ok without nocturnal  or early am exacerbation  of respiratory  c/o's or need for noct saba. Also denies any obvious fluctuation of symptoms with weather or environmental changes or other aggravating or alleviating factors except as outlined above   Current Medications, Allergies, Complete Past Medical History, Past Surgical History, Family History, and Social History were reviewed in [employer] record.  ROS  The following are not active complaints unless bolded sore throat, dysphagia, dental problems, itching, sneezing,  nasal congestion or excess/ purulent secretions, ear ache,   fever, chills, sweats, unintended wt loss, classically pleuritic or exertional cp,  orthopnea pnd or leg swelling, presyncope, palpitations, abdominal pain, anorexia, nausea, vomiting, diarrhea  or change in bowel or bladder habits, change in  stools or urine, dysuria,hematuria,  rash, arthralgias, visual complaints, headache, numbness, weakness or ataxia or problems with walking or coordination,  change in mood/affect or memory.                         Objective:   Physical Exam    amb bf nad no longer throat clearing    07/10/2016       144   06/08/16 141 lb (64 kg)  06/01/16 143 lb (64.9 kg)  05/24/16 144 lb 1.9 oz (65.4 kg)    Vital signs reviewed  HEENT: no upper teeth/ nl  turbinates, and oropharynx. Nl external ear canals without cough reflex   NECK :  without JVD/Nodes/TM/ nl carotid upstrokes  bilaterally  LUNGS: no acc muscle use,  Mod T kyphosis / distant bs with pseudowheeze only, resolves with plm   CV:  RRR  no s3 or murmur or increase in P2, no edema   ABD:  soft and nontender with nl inspiratory excursion in the supine position. No bruits or organomegaly, bowel sounds nl  MS:  Nl gait/ ext warm without deformities, calf tenderness, cyanosis or clubbing No obvious joint restrictions   SKIN: warm and dry without lesions    NEURO:  alert, approp, nl sensorium with  no motor deficits    I personally reviewed images and agree with radiology impression as follows:  CXR:  05/27/16 Cardiac pacemaker. Normal heart size and pulmonary vascularity. No focal airspace disease or consolidation in the lungs. No blunting of costophrenic angles. No pneumothorax. Mediastinal contours appear Intact.        Assessment:

## 2016-07-10 NOTE — Assessment & Plan Note (Signed)
Cbc with diff 05/27/16  Eos 0.1   - improved off acei x 6 weeks as of 07/10/2016     Classic Upper airway cough syndrome, so named because it's frequently impossible to sort out how much is  CR/sinusitis with freq throat clearing (which can be related to primary GERD)   vs  causing  secondary (" extra esophageal")  GERD from wide swings in gastric pressure that occur with throat clearing, often  promoting self use of mint and menthol lozenges that reduce the lower esophageal sphincter tone and exacerbate the problem further in a cyclical fashion.   These are the same pts (now being labeled as having "irritable larynx syndrome" by some cough centers) who not infrequently have a history of having failed to tolerate ace inhibitors,  dry powder inhalers or biphosphonates or report having atypical reflux symptoms that don't respond to standard doses of PPI , and are easily confused as having aecopd or asthma flares by even experienced allergists/ pulmonologists.  It remains to be seen whether she'll do just as well off GERD rx now that the effects of acei are gone but if flares off ppi/h2hs  rec consider GI eval rather than repeat  pulmonary eval in her case  I had an extended discussion with the patient reviewing all relevant studies completed to date and  lasting 15 to 20 minutes of a 25 minute visit    Each maintenance medication was reviewed in detail including most importantly the difference between maintenance and prns and under what circumstances the prns are to be triggered using an action plan format that is not reflected in the computer generated alphabetically organized AVS.    Please see instructions for details which were reviewed in writing and the patient given a copy highlighting the part that I personally wrote and discussed at today's ov.

## 2016-07-10 NOTE — Assessment & Plan Note (Signed)

## 2016-07-10 NOTE — Patient Instructions (Addendum)
Try to wean off all tobacco products - your lung function is still good, it's not too late  [place] to try off the acid suppression meds to see what difference if any it makes and follow up with your primary care doctor  Advise you to avoid lisinopril permanently but I would let Dr Tamala Julian and your primary care doctor decide what you need long term   Pulmonary follow up is as needed

## 2016-07-10 NOTE — Assessment & Plan Note (Signed)
D/c'd ACEi  06/01/16 for cough > improved 07/10/2016   Not Adequate control on present rx, reviewed > she insists she's upset today about a rental car and that's why bp is up but also may be related to stopping the ACEi >   Re ACEi use:  Although even in retrospect it may not be clear the ACEi contributed to the pt's symptoms,  Pt improved off them and adding them back at this point or in the future would risk confusion in interpretation of non-specific respiratory symptoms to which this patient is prone  ie  Better not to muddy the waters here.   Consider ARB or alternative options but be careful with BB in event she develops any true airways dz

## 2016-07-11 ENCOUNTER — Ambulatory Visit
Admission: RE | Admit: 2016-07-11 | Discharge: 2016-07-11 | Disposition: A | Discharge status: Home / Self Care | Payer: Medicare HMO | Source: Ambulatory Visit | Attending: Family Medicine | Admitting: Family Medicine

## 2016-07-11 DIAGNOSIS — Z1231 Encounter for screening mammogram for malignant neoplasm of breast: Secondary | ICD-10-CM

## 2016-07-27 ENCOUNTER — Telehealth: Payer: Self-pay

## 2016-07-27 NOTE — Telephone Encounter (Signed)
Referred to ICM clinic by Debroah Loop, device nurse/Dr Camnitz.  Attempted ICM intro call and left message for return call.

## 2016-07-28 NOTE — Telephone Encounter (Signed)
If the beta blockers are being tolerated, we should make no changes. If we remove the beta blockers there is a chance that her device will administer shocks.  I would also like Dr. Curt Bears to offer an opinion.

## 2016-07-28 NOTE — Telephone Encounter (Signed)
Received call back from patient.  ICM intro given and she agreed to [facility] monthly calls and 1st ICM remote transmission scheduled for 08/23/2016.  She reported she visited her pulmonary physician on 07/27/2016 and she reported he thinks she should not be taking a beta blocker and she wanted to know what Dr Tamala Julian wants there to do about taking it.  I advised would send a note to Dr Tamala Julian to see if he wants to make any changes at this time.

## 2016-07-29 NOTE — Telephone Encounter (Signed)
Needs beta blocker for HF.  Sotalol has significantly decreased her amount of VT.

## 2016-07-31 NOTE — Telephone Encounter (Signed)
Call back to patient and explained recommendation from Dr Tamala Julian and Dr Curt Bears to continue to with beta blockers which will decrease risk of shocks from device due to VT.  She stated she understands and will continue with Beta Blockers.  She stated she has some type of sensitives to things that are in meds and she has appointment with PCP.

## 2016-08-22 ENCOUNTER — Encounter: Payer: Self-pay | Admitting: Internal Medicine

## 2016-08-22 ENCOUNTER — Ambulatory Visit (INDEPENDENT_AMBULATORY_CARE_PROVIDER_SITE_OTHER): Payer: Medicare HMO | Admitting: Internal Medicine

## 2016-08-22 VITALS — BP 142/100 | HR 75 | Ht 61.5 in | Wt 146.6 lb

## 2016-08-22 DIAGNOSIS — F17210 Nicotine dependence, cigarettes, uncomplicated: Secondary | ICD-10-CM

## 2016-08-22 DIAGNOSIS — R05 Cough: Secondary | ICD-10-CM

## 2016-08-22 DIAGNOSIS — R058 Other specified cough: Secondary | ICD-10-CM

## 2016-08-22 DIAGNOSIS — I10 Essential (primary) hypertension: Secondary | ICD-10-CM

## 2016-08-22 MED ORDER — METHYLPREDNISOLONE ACETATE 80 MG/ML IJ SUSP
120.0000 mg | Freq: Once | INTRAMUSCULAR | Status: AC
Start: 2016-08-22 — End: 2016-08-22
  Administered 2016-08-22: 120 mg via INTRAMUSCULAR

## 2016-08-22 NOTE — Progress Notes (Signed)
Subjective:    Patient ID: Colleen Lowery, female   DOB: [birth year],     MRN: [id number]    Brief patient profile:  55 yobf active smoker with problems with stuffy nose as child "grew up in moldy house" and poor ex tol as teenage finished near the end of a group and difficulty blowing out and  Cough ever since college except when she was pregnant  But w/in 6 weeks p vaginal delivery  back to same pattern and referred to pulmonary clinic 06/08/2016 by Dr Daneen Schick p d/c ACEi  06/01/16     History of Present Illness  06/08/2016 1st [facility] office visit/ Colleen Lowery   Chief Complaint  Patient presents with  . Pulmonary Consult    Referred by Dr. Pernell Dupre.  Pt c/o Dyspnea and "lots of mucus" "spasms in throat" since April 2017.   cough x ever since college much worse since starting ACEi in late march 2017 and better since stopped one week prior to OV  "getting back to normal self now" and Not limited by breathing from desired activities  But still sense of sob at noct with tightness/ generalized Lives in rental house/ elevated sev feet off the ground  X sev years Goes to "allergist" in [place] says Pos dust / mold" has med for under tongue but didn't help her.  Every night since d/c sleeps about 45 degrees afraid to try to lie flat due to 2-3/7 nights has trouble getting to sleep due to sob/chest tight but once gets to sleep does ok s premature awakening  No trouble with  doe proportionate to activity  No better with inhaler / worse with cleaning chemical exp or dust / better with spleen extract  rec Pantoprazole (protonix) 40 mg   Take  30-60 min before first meal of the day and Pepcid (famotidine)  20 mg one @  bedtime until return to office - this is the best way to tell whether stomach acid is contributing to your problem.   GERD diet    07/10/2016  f/u ov/Colleen Lowery re:  ? uacs from acei  Chief Complaint  Patient presents with  . Follow-up    Breathing has improved and she has not noticed any  more "spasms" in her throat.   main focus is intermittent abd swelling/ no longer sob or having choking issues or orthopnea but still very sensitive to smells "even fabrize" which makes her feel "tight" = points to her abdomen. rec Try to wean off all tobacco products - your lung function is still good, it's not too late [place] to try off the acid suppression meds to see what difference if any it makes and follow up with your primary care doctor Advise you to avoid lisinopril permanently but I would let Dr Tamala Julian and your primary care doctor decide what you need long term     08/22/2016 acute extended ov/Colleen Lowery re: uacs/ on multiple BB with ? Asthma component  Chief Complaint  Patient presents with  . Acute Visit    Pt. is here to request a cortisol shot, Pt. states she notices that she is very sensative to different enviorments and affects her lungs,   main problem is swelling in belly since 20's with the exception while pregnant  Assoc with constipation  Extremely difficult hx with variable coughing attributed to smells but not on any inhalers/ Not limited by breathing from desired activities    No obvious day to day or daytime variability or  assoc excess/ purulent sputum or mucus plugs or hemoptysis or cp or  subjective wheeze or overt hb symptoms. No unusual exp hx or h/o childhood pna/ asthma or knowledge of premature birth.  Sleeping ok without nocturnal  or early am exacerbation  of respiratory  c/o's or need for noct saba. Also denies any obvious fluctuation of symptoms with weather or environmental changes or other aggravating or alleviating factors except as outlined above   Current Medications, Allergies, Complete Past Medical History, Past Surgical History, Family History, and Social History were reviewed in [employer] record.  ROS  The following are not active complaints unless bolded sore throat, dysphagia, dental problems, itching, sneezing,  nasal congestion  or excess/ purulent secretions, ear ache,   fever, chills, sweats, unintended wt loss, classically pleuritic or exertional cp,  orthopnea pnd or leg swelling, presyncope, palpitations, abdominal pain/swelling ? Worse on PPI , anorexia, nausea, vomiting, diarrhea  or change in bowel or bladder habits, change in stools or urine, dysuria,hematuria,  rash, arthralgias, visual complaints, headache, numbness, weakness or ataxia or problems with walking or coordination,  change in mood/affect or memory.                         Objective:   Physical Exam    amb bf nad    08/22/2016       147       07/10/2016       144   06/08/16 141 lb (64 kg)  06/01/16 143 lb (64.9 kg)  05/24/16 144 lb 1.9 oz (65.4 kg)    Vital signs reviewed - Note on arrival 02 sats  100% on RA  - note BP up   HEENT: no upper teeth/ nl  turbinates, and oropharynx. Nl external ear canals without cough reflex   NECK :  without JVD/Nodes/TM/ nl carotid upstrokes bilaterally  LUNGS: no acc muscle use,  Mod T kyphosis / distant bs with pseudowheeze only, resolves with plm   CV:  RRR  no s3 or murmur or increase in P2, no edema   ABD:  soft and nontender with nl inspiratory excursion in the supine position. No bruits or organomegaly, bowel sounds nl  MS:  Nl gait/ ext warm without deformities, calf tenderness, cyanosis or clubbing No obvious joint restrictions   SKIN: warm and dry without lesions    NEURO:  alert, approp, nl sensorium with  no motor deficits    I personally reviewed images and agree with radiology impression as follows:  CXR:  05/27/16 Cardiac pacemaker. Normal heart size and pulmonary vascularity. No focal airspace disease or consolidation in the lungs. No blunting of costophrenic angles. No pneumothorax. Mediastinal contours appear Intact.        Assessment:

## 2016-08-22 NOTE — Patient Instructions (Signed)
The key is to stop smoking completely before smoking completely stops you!   Depomedrol 120 mg IM   Try off pantoprazole and change pepcid 20 mg twice daily   You will need to work out with Dr Tamala Julian how to manage your beta blockers but they can cause wheezing with certain exposures and this may be a problem for future consideration  As is the choice of losartan (prefer Valsartan for this class of drugs  Pulmonary follow up is as needed

## 2016-08-23 ENCOUNTER — Ambulatory Visit (INDEPENDENT_AMBULATORY_CARE_PROVIDER_SITE_OTHER): Payer: Medicare HMO | Admitting: *Deleted

## 2016-08-23 ENCOUNTER — Telehealth: Payer: Self-pay | Admitting: Cardiology

## 2016-08-23 DIAGNOSIS — I5042 Chronic combined systolic (congestive) and diastolic (congestive) heart failure: Secondary | ICD-10-CM

## 2016-08-23 DIAGNOSIS — I472 Ventricular tachycardia, unspecified: Secondary | ICD-10-CM

## 2016-08-23 DIAGNOSIS — Z95810 Presence of automatic (implantable) cardiac defibrillator: Secondary | ICD-10-CM | POA: Diagnosis not present

## 2016-08-23 NOTE — Progress Notes (Signed)
EPIC Encounter for ICM Monitoring  Patient Name: Colleen Lowery is a 75 y.o. female Date: 08/23/2016 Primary Care Physican: [name], MD Primary Cardiologist: Gwenlyn Found Electrophysiologist: Curt Bears Dry Weight: 146 lb       Heart Failure questions reviewed, pt asymptomatic.  She stated if she has fluid she normally has it in her stomach but does not have any at this time.   Thoracic impedance abnormal suggesting fluid accumulation since 06/13/2016.  Recommendations:  Patient is not currently prescribed a diuretic.   Reviewed low sodium diet and advised to limit salt intake to 2000 mg daily.  Encouraged to call for fluid symptoms.  Patient is very sensitive to chemicals.    Follow-up plan: ICM clinic phone appointment on 09/26/2016.  Copy of ICM check sent to primary cardiologist and device physician.   ICM trend: 08/23/2016       Rosalene Billings, RN 08/23/2016 1:22 PM

## 2016-08-23 NOTE — Telephone Encounter (Signed)
Spoke with pt and reminded pt of remote transmission that is due today. Pt verbalized understanding.   

## 2016-08-23 NOTE — Assessment & Plan Note (Addendum)
Cbc with diff 05/27/16  Eos 0.1   Spirometry 06/08/2016  wnl though there is slt curvature to the f/v loop on coreg and sotelol - improved off acei x 6 weeks as of 07/10/2016   And on gerd rx - changed gerd rx to h2 bid 08/22/2016 due to GI symptoms ? Related to PPI (gas)   She could not perform a FENO today but I strongly doubt she has allergic asthma or any form of asthma given that she's on multiple forms of BB (coreg and betapace) but it's hard to be sure and may consider methacholine challenge to settle the issue.   For now rec depomedrol 120 mg IM and f/u with PC and cards re BP elevation   I had an extended discussion with the patient reviewing all relevant studies completed to date and  lasting 15 to 20 minutes of a 25 minute visit    Each maintenance medication was reviewed in detail including most importantly the difference between maintenance and prns and under what circumstances the prns are to be triggered using an action plan format that is not reflected in the computer generated alphabetically organized AVS.    Please see instructions for details which were reviewed in writing and the patient given a copy highlighting the part that I personally wrote and discussed at today's ov.

## 2016-08-23 NOTE — Progress Notes (Signed)
Remote ICD transmission.   

## 2016-08-23 NOTE — Assessment & Plan Note (Signed)
D/c'd ACEi  06/01/16 for cough > improved 07/10/2016   Not optimally controlled at present ? Adherent?   Also note on losartan : For reasons that may related to vascular permability and nitric oxide pathways but not elevated  bradykinin levels (as seen with  ACEi use) losartan in the generic form has been reported now from mulitple sources  to cause a similar pattern of non-specific  upper airway symptoms as seen with acei.   This has not been reported with exposure to the other ARB's to date, so it seems reasonable for now to try either generic diovan or avapro if ARB needed or use an alternative class altogether.  See:  Lelon Frohlich Allergy Asthma Immunol  2008: 101: p [phone]

## 2016-08-23 NOTE — Assessment & Plan Note (Signed)
>   3 min Discussed the risks and costs (both direct and indirect)  of smoking relative to the benefits of quitting but patient unwilling to commit at this point to a specific quit date.

## 2016-08-24 ENCOUNTER — Encounter: Payer: Self-pay | Admitting: Cardiology

## 2016-09-07 ENCOUNTER — Encounter: Payer: Self-pay | Admitting: Cardiology

## 2016-09-15 ENCOUNTER — Other Ambulatory Visit: Payer: Self-pay | Admitting: Physician Assistant

## 2016-09-15 NOTE — Telephone Encounter (Signed)
Dr. Gwenlyn Found pt.

## 2016-09-22 LAB — CUP PACEART REMOTE DEVICE CHECK
Battery Remaining Longevity: 136 mo
Battery Voltage: 3.1 V
Brady Statistic RV Percent Paced: 0.1 %
Date Time Interrogation Session: 20171011162459
HighPow Impedance: 62 Ohm
Implantable Lead Implant Date: 20170330
Implantable Lead Location: 753860
Implantable Pulse Generator Implant Date: 20170330
Lead Channel Impedance Value: 304 Ohm
Lead Channel Impedance Value: 361 Ohm
Lead Channel Pacing Threshold Amplitude: 1.125 V
Lead Channel Pacing Threshold Pulse Width: 0.4 ms
Lead Channel Sensing Intrinsic Amplitude: 18.375 mV
Lead Channel Sensing Intrinsic Amplitude: 18.375 mV
Lead Channel Setting Pacing Amplitude: 2.5 V
Lead Channel Setting Pacing Pulse Width: 0.4 ms
Lead Channel Setting Sensing Sensitivity: 0.3 mV

## 2016-09-26 ENCOUNTER — Ambulatory Visit (INDEPENDENT_AMBULATORY_CARE_PROVIDER_SITE_OTHER): Payer: Medicare HMO

## 2016-09-26 DIAGNOSIS — I5042 Chronic combined systolic (congestive) and diastolic (congestive) heart failure: Secondary | ICD-10-CM | POA: Diagnosis not present

## 2016-09-26 DIAGNOSIS — Z95810 Presence of automatic (implantable) cardiac defibrillator: Secondary | ICD-10-CM | POA: Diagnosis not present

## 2016-09-26 NOTE — Progress Notes (Signed)
EPIC Encounter for ICM Monitoring  Patient Name: Colleen Lowery is a 75 y.o. female Date: 09/26/2016 Primary Care Physican: [name], MD Primary Cardiologist: Gwenlyn Found Electrophysiologist: Curt Bears Dry Weight: 144 lbs        Heart Failure questions reviewed, pt asymptomatic   Thoracic impedance normal   Recommendations: Patient is not currently prescribed a diuretic.   Reviewed low sodium diet and advised to limit salt intake to 2000 mg daily.  Encouraged to call for fluid symptoms.  Patient is very sensitive to chemicals.     Follow-up plan: ICM clinic phone appointment on 10/30/2016.  Copy of ICM check sent to device physician.   ICM trend: 09/26/2016       Rosalene Billings, RN 09/26/2016 1:10 PM

## 2016-10-10 DIAGNOSIS — Z95810 Presence of automatic (implantable) cardiac defibrillator: Secondary | ICD-10-CM | POA: Insufficient documentation

## 2016-12-05 ENCOUNTER — Ambulatory Visit: Payer: Medicare HMO | Admitting: Interventional Cardiology

## 2017-01-19 ENCOUNTER — Other Ambulatory Visit: Payer: Self-pay | Admitting: Gastroenterology

## 2017-02-13 ENCOUNTER — Encounter (HOSPITAL_COMMUNITY): Payer: Self-pay | Admitting: *Deleted

## 2017-02-14 ENCOUNTER — Telehealth: Payer: Self-pay | Admitting: Cardiology

## 2017-02-14 NOTE — Telephone Encounter (Signed)
New Message:   Pt wants to be sure that you were aware that she no longer gets her device checked her. She now see a doctor [place],

## 2017-02-15 NOTE — H&P (Signed)
Colleen Lowery HPI: This 76 year old black female presents to the office for colorectal cancer screening. She has 1 BM every 2 days lately. This has been an improvement for her. She used to go up to up to 3 weeks without a BM. She has some mucus in the stools but denies having any melena or hematochezia. She has good appetite and her weight has been stable. She denies having any complaints of abdominal pain, nausea, vomiting, dysphagia or odynophagia. She denies having a family history of colon cancer, celiac sprue or IBD. Her last colonoscopy done on 01/04/2011 revealed melanosis coli and a tubular adenoma was removed from the ascending colon in a background of mealnosic coli.  She does have some intermittent issues with GERD and she reports a history of PUD in the past.  Past Medical History:  Diagnosis Date  . Allergy   . Arthritis    FINGERS  . Depression   . Diabetes mellitus without complication ([place])    TYPE 2 , TAKING OTC MED FORGYMEMA  . GERD (gastroesophageal reflux disease)   . Heart failure, acute, systolic and diastolic ([place]) MARCH 4008 DX  . Hypertension   . 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 Thyroid disease    AGE 89'S UNTIL  2016 OFF ALL THRYOID MEDS NOW     Past Surgical History:  Procedure Laterality Date  . ABDOMINAL HYSTERECTOMY     PARTIAL  . CARDIAC CATHETERIZATION  02/10/2016   Procedure: Right/Left Heart Cath and Coronary Angiography;  Surgeon: Leonie Man, MD;  Location: [facility];  Service: Cardiovascular;;  . EP IMPLANTABLE DEVICE N/A 02/10/2016   Procedure: ICD Implant;  Surgeon: Will Meredith Leeds, MD;  Location: [facility];  Service: Cardiovascular;  Laterality: N/A;  . TONSILLECTOMY      Family History  Problem Relation Age of Onset  . Diabetes Mother   . Stroke Mother   . Diabetes Brother   . Diabetes Maternal Grandmother   . Diabetes Paternal Grandfather   . Heart failure Paternal Grandfather   .  Hypertension Brother   . Heart attack Neg Hx     Social History:  reports that she has been smoking Cigarettes and Cigars.  She has a 50.00 pack-year smoking history. She has never used smokeless tobacco. She reports that she does not drink alcohol or use drugs.  Allergies:  Allergies  Allergen Reactions  . Contrast Media [Iodinated Diagnostic Agents] Anaphylaxis    Throat closed, was 40 yrs ago  . Purell Instant Hand [Alcohol] Swelling    Swelling in the luNGS, CHEST AND THROAT  . Atorvastatin     Myalgia   . Dust Mite Extract Swelling    SWELLING IS GI SYSTEM  . Latex     QUESTIONABLE INTOLERABLE MUCOUS OOZING OUT OF THROAT  . Molds & Smuts Swelling    SWELLING IS IN GI SYSTEM    Medications: Scheduled: Continuous:  No results found for this or any previous visit (from the past 24 hour(s)).   No results found.  ROS:  As stated above in the HPI otherwise negative.  There were no vitals taken for this visit.    PE: Gen: NAD, Alert and Oriented HEENT:  [place]/AT, EOMI Neck: Supple, no LAD Lungs: CTA Bilaterally CV: RRR without M/G/R ABM: Soft, NTND, +BS Ext: No C/C/E  Assessment/Plan: 1) Personal history of polyps - Colonoscopy. 2) GERD and history of PUD -  EGD [facility] [place] D 02/15/2017, 1:53 PM

## 2017-02-16 ENCOUNTER — Ambulatory Visit (HOSPITAL_COMMUNITY): Payer: Medicare HMO | Admitting: Certified Registered Nurse Anesthetist

## 2017-02-16 ENCOUNTER — Ambulatory Visit (HOSPITAL_COMMUNITY)
Admission: RE | Admit: 2017-02-16 | Discharge: 2017-02-16 | Disposition: A | Discharge status: Home / Self Care | Payer: Medicare HMO | Source: Ambulatory Visit | Attending: Gastroenterology | Admitting: Gastroenterology

## 2017-02-16 ENCOUNTER — Encounter (HOSPITAL_COMMUNITY): Payer: Self-pay | Admitting: *Deleted

## 2017-02-16 ENCOUNTER — Encounter (HOSPITAL_COMMUNITY)
Admission: RE | Disposition: A | Discharge status: Home / Self Care | Payer: Self-pay | Source: Ambulatory Visit | Attending: Gastroenterology

## 2017-02-16 DIAGNOSIS — F17210 Nicotine dependence, cigarettes, uncomplicated: Secondary | ICD-10-CM | POA: Insufficient documentation

## 2017-02-16 DIAGNOSIS — R569 Unspecified convulsions: Secondary | ICD-10-CM | POA: Diagnosis not present

## 2017-02-16 DIAGNOSIS — Z888 Allergy status to other drugs, medicaments and biological substances status: Secondary | ICD-10-CM | POA: Insufficient documentation

## 2017-02-16 DIAGNOSIS — D120 Benign neoplasm of cecum: Secondary | ICD-10-CM | POA: Diagnosis not present

## 2017-02-16 DIAGNOSIS — Z1211 Encounter for screening for malignant neoplasm of colon: Secondary | ICD-10-CM | POA: Insufficient documentation

## 2017-02-16 DIAGNOSIS — K319 Disease of stomach and duodenum, unspecified: Secondary | ICD-10-CM | POA: Diagnosis not present

## 2017-02-16 DIAGNOSIS — I5042 Chronic combined systolic (congestive) and diastolic (congestive) heart failure: Secondary | ICD-10-CM | POA: Diagnosis not present

## 2017-02-16 DIAGNOSIS — K449 Diaphragmatic hernia without obstruction or gangrene: Secondary | ICD-10-CM | POA: Insufficient documentation

## 2017-02-16 DIAGNOSIS — Z86010 Personal history of colonic polyps: Secondary | ICD-10-CM | POA: Insufficient documentation

## 2017-02-16 DIAGNOSIS — Z91040 Latex allergy status: Secondary | ICD-10-CM | POA: Diagnosis not present

## 2017-02-16 DIAGNOSIS — E119 Type 2 diabetes mellitus without complications: Secondary | ICD-10-CM | POA: Diagnosis not present

## 2017-02-16 DIAGNOSIS — Z91048 Other nonmedicinal substance allergy status: Secondary | ICD-10-CM | POA: Insufficient documentation

## 2017-02-16 DIAGNOSIS — K222 Esophageal obstruction: Secondary | ICD-10-CM | POA: Diagnosis not present

## 2017-02-16 DIAGNOSIS — K6389 Other specified diseases of intestine: Secondary | ICD-10-CM | POA: Diagnosis not present

## 2017-02-16 DIAGNOSIS — Z91041 Radiographic dye allergy status: Secondary | ICD-10-CM | POA: Insufficient documentation

## 2017-02-16 DIAGNOSIS — I110 Hypertensive heart disease with heart failure: Secondary | ICD-10-CM | POA: Diagnosis not present

## 2017-02-16 DIAGNOSIS — Z79899 Other long term (current) drug therapy: Secondary | ICD-10-CM | POA: Insufficient documentation

## 2017-02-16 DIAGNOSIS — I255 Ischemic cardiomyopathy: Secondary | ICD-10-CM | POA: Diagnosis not present

## 2017-02-16 DIAGNOSIS — K2970 Gastritis, unspecified, without bleeding: Secondary | ICD-10-CM | POA: Diagnosis not present

## 2017-02-16 DIAGNOSIS — R12 Heartburn: Secondary | ICD-10-CM | POA: Insufficient documentation

## 2017-02-16 DIAGNOSIS — D124 Benign neoplasm of descending colon: Secondary | ICD-10-CM | POA: Diagnosis not present

## 2017-02-16 DIAGNOSIS — F17290 Nicotine dependence, other tobacco product, uncomplicated: Secondary | ICD-10-CM | POA: Insufficient documentation

## 2017-02-16 DIAGNOSIS — K219 Gastro-esophageal reflux disease without esophagitis: Secondary | ICD-10-CM | POA: Insufficient documentation

## 2017-02-16 DIAGNOSIS — D123 Benign neoplasm of transverse colon: Secondary | ICD-10-CM | POA: Diagnosis not present

## 2017-02-16 HISTORY — DX: Unspecified osteoarthritis, unspecified site: M19.90

## 2017-02-16 HISTORY — PX: ESOPHAGOGASTRODUODENOSCOPY (EGD) WITH PROPOFOL: SHX5813

## 2017-02-16 HISTORY — PX: COLONOSCOPY WITH PROPOFOL: SHX5780

## 2017-02-16 HISTORY — DX: Gastro-esophageal reflux disease without esophagitis: K21.9

## 2017-02-16 LAB — GLUCOSE, CAPILLARY: Glucose-Capillary: 144 mg/dL — ABNORMAL HIGH (ref 65–99)

## 2017-02-16 SURGERY — COLONOSCOPY WITH PROPOFOL
Anesthesia: Monitor Anesthesia Care

## 2017-02-16 MED ORDER — PROPOFOL 10 MG/ML IV BOLUS
INTRAVENOUS | Status: DC | PRN
  Administered 2017-02-16 (×5): 20 mg via INTRAVENOUS

## 2017-02-16 MED ORDER — LACTATED RINGERS IV SOLN
INTRAVENOUS | Status: DC
  Administered 2017-02-16: 1000 mL via INTRAVENOUS

## 2017-02-16 MED ORDER — LIDOCAINE 2% (20 MG/ML) 5 ML SYRINGE
INTRAMUSCULAR | Status: DC | PRN
  Administered 2017-02-16: 100 mg via INTRAVENOUS

## 2017-02-16 MED ORDER — SODIUM CHLORIDE 0.9 % IV SOLN: INTRAVENOUS | Status: DC

## 2017-02-16 MED ORDER — PROPOFOL 500 MG/50ML IV EMUL
INTRAVENOUS | Status: DC | PRN
  Administered 2017-02-16: 75 ug/kg/min via INTRAVENOUS

## 2017-02-16 MED ORDER — ONDANSETRON HCL 4 MG/2ML IJ SOLN
INTRAMUSCULAR | Status: DC | PRN
  Administered 2017-02-16: 4 mg via INTRAVENOUS

## 2017-02-16 SURGICAL SUPPLY — 21 items

## 2017-02-16 NOTE — Transfer of Care (Signed)
Immediate Anesthesia Transfer of Care Note  Patient: Colleen Lowery  Procedure(s) Performed: Procedure(s) with comments: COLONOSCOPY WITH PROPOFOL (N/A) ESOPHAGOGASTRODUODENOSCOPY (EGD) WITH PROPOFOL (N/A) - reflux  Patient Location: ENDO  Anesthesia Type:MAC  Level of Consciousness:  sedated, patient cooperative and responds to stimulation  Airway & Oxygen Therapy:Patient Spontanous Breathing and Patient connected to face mask oxgen  Post-op Assessment:  Report given to ENDO RN and Post -op Vital signs reviewed and stable  Post vital signs:  Reviewed and stable  Last Vitals:  Vitals:   02/16/17 1144  BP: (!) 168/108  Pulse: 61  Resp: 15  Temp: 71.9 C    Complications: No apparent anesthesia complications

## 2017-02-16 NOTE — Anesthesia Preprocedure Evaluation (Addendum)
Anesthesia Evaluation  Patient identified by MRN, date of birth, ID band Patient awake    Reviewed: Allergy & Precautions, H&P , Patient's Chart, lab work & pertinent test results, reviewed documented beta blocker date and time   Airway Mallampati: II  TM Distance: >3 FB Neck ROM: full    Dental no notable dental hx.    Pulmonary Current Smoker,    Pulmonary exam normal breath sounds clear to auscultation       Cardiovascular hypertension,  Rhythm:regular Rate:Normal     Neuro/Psych    GI/Hepatic   Endo/Other  diabetes  Renal/GU      Musculoskeletal   Abdominal   Peds  Hematology   Anesthesia Other Findings   1. 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  2. Ventricular tachycardia with  ICD implantation.  3.  HTN: on sotalol and coreg  The cavity size was moderately to severely   dilated. There was mild concentric hypertrophy. Systolic function was mildly to moderately reduced. The estimated ejection fraction was 40% to 45% . Mild hypokinesis of the inferior and inferoseptal myocardium  also has diastolic dysfunction  Reproductive/Obstetrics                            Anesthesia Physical Anesthesia Plan  ASA: II  Anesthesia Plan: MAC   Post-op Pain Management:    Induction: Intravenous  Airway Management Planned: Mask and Natural Airway  Additional Equipment:   Intra-op Plan:   Post-operative Plan:   Informed Consent: I have reviewed the patients History and Physical, chart, labs and discussed the procedure including the risks, benefits and alternatives for the proposed anesthesia with the patient or authorized representative who has indicated his/her understanding and acceptance.   Dental Advisory Given  Plan Discussed with: CRNA and Surgeon  Anesthesia Plan Comments:         Anesthesia Quick Evaluation

## 2017-02-16 NOTE — Discharge Instructions (Signed)

## 2017-02-16 NOTE — Op Note (Signed)
[facility] Patient Name: Colleen Lowery Procedure Date: 02/16/2017 MRN: [id number] Attending MD: Carol Ada , MD Date of Birth: [birth year] CSN: [id number] Age: 76 Admit Type: Inpatient Procedure:                Upper GI endoscopy Indications:              Heartburn Providers:                Carol Ada, MD, Carolynn Comment, RN, William Dalton, Technician Referring MD:              Medicines:                Propofol per Anesthesia Complications:            No immediate complications. Estimated Blood Loss:     Estimated blood loss was minimal. Procedure:                Pre-Anesthesia Assessment:                           - Prior to the procedure, a History and Physical                            was performed, and patient medications and                            allergies were reviewed. The patient's tolerance of                            previous anesthesia was also reviewed. The risks                            and benefits of the procedure and the sedation                            options and risks were discussed with the patient.                            All questions were answered, and informed consent                            was obtained. Prior Anticoagulants: The patient has                            taken no previous anticoagulant or antiplatelet                            agents. ASA Grade Assessment: III - A patient with                            severe systemic disease. After reviewing the risks                            and  benefits, the patient was deemed in                            satisfactory condition to undergo the procedure.                           - Sedation was administered by an anesthesia                            professional. Deep sedation was attained.                           After obtaining informed consent, the endoscope was                            passed under direct vision. Throughout the                            procedure, the patient's blood pressure, pulse, and                            oxygen saturations were monitored continuously. The                            EC-[id number] ([id number]) scope was introduced through                            the mouth, and advanced to the second part of                            duodenum. The upper GI endoscopy was accomplished                            without difficulty. The patient tolerated the                            procedure well. Scope In: Scope Out: Findings:      A 2 cm hiatal hernia was present.      A mild Schatzki ring (acquired) was found in the lower third of the       esophagus.      Patchy moderate inflammation characterized by erythema was found in the       gastric fundus and in the gastric antrum. Biopsies were taken with a       cold forceps for histology.      The examined duodenum was normal. Impression:               - 2 cm hiatal hernia.                           - Mild Schatzki ring.                           - Gastritis. Biopsied.                           -  Normal examined duodenum. Moderate Sedation:      N/A- Per Anesthesia Care Recommendation:           - Patient has a contact number available for                            emergencies. The signs and symptoms of potential                            delayed complications were discussed with the                            patient. Return to normal activities tomorrow.                            Written discharge instructions were provided to the                            patient.                           - Resume previous diet.                           - Continue present medications.                           - Await pathology results. Procedure Code(s):        --- Professional ---                           [phone], Esophagogastroduodenoscopy, flexible,                            transoral; with biopsy, single or multiple Diagnosis Code(s):         --- Professional ---                           K44.9, Diaphragmatic hernia without obstruction or                            gangrene                           K22.2, Esophageal obstruction                           K29.70, Gastritis, unspecified, without bleeding                           R12, Heartburn CPT copyright 2016 American Medical Association. All rights reserved. The codes documented in this report are preliminary and upon coder review may  be revised to meet current compliance requirements. Carol Ada, MD Carol Ada, MD 02/16/2017 1:09:33 PM This report has been signed electronically. Number of Addenda: 0

## 2017-02-16 NOTE — Interval H&P Note (Signed)
History and Physical Interval Note:  02/16/2017 12:16 PM  Colleen Lowery  has presented today for surgery, with the diagnosis of screening  The various methods of treatment have been discussed with the patient and family. After consideration of risks, benefits and other options for treatment, the patient has consented to  Procedure(s) with comments: COLONOSCOPY WITH PROPOFOL (N/A) ESOPHAGOGASTRODUODENOSCOPY (EGD) WITH PROPOFOL (N/A) - reflux as a surgical intervention .  The patient's history has been reviewed, patient examined, no change in status, stable for surgery.  I have reviewed the patient's chart and labs.  Questions were answered to the patient's satisfaction.     Zhanna Melin D

## 2017-02-16 NOTE — Op Note (Signed)
[facility] Patient Name: Colleen Lowery Procedure Date: 02/16/2017 MRN: [id number] Attending MD: Carol Ada , MD Date of Birth: 1940-12-01 CSN: [id number] Age: 76 Admit Type: Inpatient Procedure:                Colonoscopy Indications:              High risk colon cancer surveillance: Personal                            history of colonic polyps Providers:                Carol Ada, MD, Carolynn Comment, RN, William Dalton, Technician Referring MD:              Medicines:                Propofol per Anesthesia Complications:            No immediate complications. Estimated Blood Loss:     Estimated blood loss was minimal. Procedure:                Pre-Anesthesia Assessment:                           - Prior to the procedure, a History and Physical                            was performed, and patient medications and                            allergies were reviewed. The patient's tolerance of                            previous anesthesia was also reviewed. The risks                            and benefits of the procedure and the sedation                            options and risks were discussed with the patient.                            All questions were answered, and informed consent                            was obtained. Prior Anticoagulants: The patient has                            taken no previous anticoagulant or antiplatelet                            agents. ASA Grade Assessment: III - A patient with                            severe  systemic disease. After reviewing the risks                            and benefits, the patient was deemed in                            satisfactory condition to undergo the procedure.                           - Sedation was administered by an anesthesia                            professional. Deep sedation was attained.                           After obtaining informed consent, the  colonoscope                            was passed under direct vision. Throughout the                            procedure, the patient's blood pressure, pulse, and                            oxygen saturations were monitored continuously. The                            EC-[id number] ([id number]) scope was introduced through                            the anus and advanced to the the cecum, identified                            by appendiceal orifice and ileocecal valve. The                            colonoscopy was performed without difficulty. The                            patient tolerated the procedure well. The quality                            of the bowel preparation was excellent. The                            ileocecal valve, appendiceal orifice, and rectum                            were photographed. Findings:      12 sessile polyps were found in the descending colon, transverse colon       and cecum. The polyps were 2 to 4 mm in size. These polyps were removed       with a cold snare. Resection and retrieval were complete.      A diffuse area of  severe melanosis was found in the entire colon. Impression:               - 12 2 to 4 mm polyps in the descending colon, in                            the transverse colon and in the cecum, removed with                            a cold snare. Resected and retrieved.                           - Melanosis in the colon. Moderate Sedation:      N/A- Per Anesthesia Care Recommendation:           - Patient has a contact number available for                            emergencies. The signs and symptoms of potential                            delayed complications were discussed with the                            patient. Return to normal activities tomorrow.                            Written discharge instructions were provided to the                            patient.                           - Resume previous diet.                            - Continue present medications.                           - Await pathology results.                           - Repeat colonoscopy in 1-3 year for surveillance. Procedure Code(s):        --- Professional ---                           [phone], Colonoscopy, flexible; with removal of                            tumor(s), polyp(s), or other lesion(s) by snare                            technique Diagnosis Code(s):        --- Professional ---                           D12.4, Benign neoplasm of  descending colon                           Z86.010, Personal history of colonic polyps                           D12.3, Benign neoplasm of transverse colon (hepatic                            flexure or splenic flexure)                           D12.0, Benign neoplasm of cecum                           K63.89, Other specified diseases of intestine CPT copyright 2016 American Medical Association. All rights reserved. The codes documented in this report are preliminary and upon coder review may  be revised to meet current compliance requirements. Carol Ada, MD Carol Ada, MD 02/16/2017 1:06:38 PM This report has been signed electronically. Number of Addenda: 0

## 2017-02-16 NOTE — Anesthesia Postprocedure Evaluation (Signed)
Anesthesia Post Note  Patient: Colleen Lowery  Procedure(s) Performed: Procedure(s) (LRB): COLONOSCOPY WITH PROPOFOL (N/A) ESOPHAGOGASTRODUODENOSCOPY (EGD) WITH PROPOFOL (N/A)  Patient location during evaluation: PACU Anesthesia Type: MAC Level of consciousness: awake and alert Pain management: pain level controlled Vital Signs Assessment: post-procedure vital signs reviewed and stable Respiratory status: spontaneous breathing, nonlabored ventilation, respiratory function stable and patient connected to nasal cannula oxygen Cardiovascular status: stable and blood pressure returned to baseline Anesthetic complications: no       Last Vitals:  Vitals:   02/16/17 1325 02/16/17 1330  BP:  (!) 203/77  Pulse: (!) 55 60  Resp: 14 16  Temp:      Last Pain:  Vitals:   02/16/17 1309  TempSrc: Oral                 Lasharn Bufkin EDWARD

## 2017-02-20 ENCOUNTER — Encounter (HOSPITAL_COMMUNITY): Payer: Self-pay | Admitting: Gastroenterology

## 2017-05-01 IMAGING — NM NM PULMONARY VENT & PERF
16 series · 16 of 16 positions shown · non-contrast
Comparison: None.

CLINICAL DATA: Shortness of breath and elevated D-dimer

EXAM:
NUCLEAR MEDICINE VENTILATION - PERFUSION LUNG SCAN
TECHNIQUE: Ventilation images were obtained in multiple projections using
inhaled aerosol Bc-QQm DTPA. Perfusion images were obtained in
multiple projections after intravenous injection of Bc-QQm MAA.
RADIOPHARMACEUTICALS:  31 MCi 1echnetium-88m DTPA aerosol inhalation
and 4.2 mCi 1echnetium-88m MAA IV

[Series 1: ant/post vent · 4.14mm/px · 1 of 1 slices shown (1 of 2)]
[im 1/1]
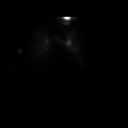

[Series 1: ant/post vent · 4.14mm/px · 1 of 1 slices shown (2 of 2)]
[im 1/1]
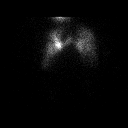

[Series 2: lao/rpo vent · 4.14mm/px · 1 of 1 slices shown (1 of 2)]
[im 1/1]
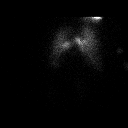

[Series 2: lao/rpo vent · 4.14mm/px · 1 of 1 slices shown (2 of 2)]
[im 1/1]
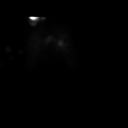

[Series 3: lpo/rao vent · 4.14mm/px · 1 of 1 slices shown (1 of 2)]
[im 1/1]
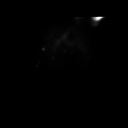

[Series 3: lpo/rao vent · 4.14mm/px · 1 of 1 slices shown (2 of 2)]
[im 1/1]
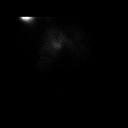

[Series 4: lt lat/rt lat vent · 4.14mm/px · 1 of 1 slices shown (1 of 2)]
[im 1/1]
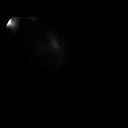

[Series 4: lt lat/rt lat vent · 4.14mm/px · 1 of 1 slices shown (2 of 2)]
[im 1/1]
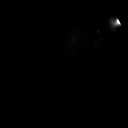

[Series 5: lt lat/rt lat perf · 4.14mm/px · 1 of 1 slices shown (1 of 2)]
[im 1/1]
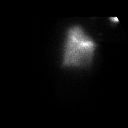

[Series 5: lt lat/rt lat perf · 4.14mm/px · 1 of 1 slices shown (2 of 2)]
[im 1/1]
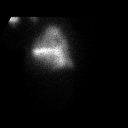

[Series 6: lpo/rao perf · 4.14mm/px · 1 of 1 slices shown (1 of 2)]
[im 1/1]
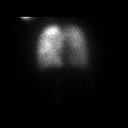

[Series 6: lpo/rao perf · 4.14mm/px · 1 of 1 slices shown (2 of 2)]
[im 1/1]
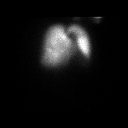

[Series 7: ant/post perf · 4.14mm/px · 1 of 1 slices shown (1 of 2)]
[im 1/1]
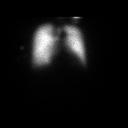

[Series 7: ant/post perf · 4.14mm/px · 1 of 1 slices shown (2 of 2)]
[im 1/1]
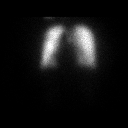

[Series 8: lao/rpo perf · 4.14mm/px · 1 of 1 slices shown (1 of 2)]
[im 1/1]
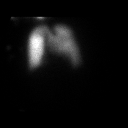

[Series 8: lao/rpo perf · 4.14mm/px · 1 of 1 slices shown (2 of 2)]
[im 1/1]
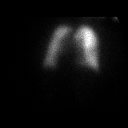

[16 of 16 positions shown; findings below may reference images not displayed]

FINDINGS: Ventilation: Clumping of the radiopharmaceutical is identified
within the central airways. There are several small ventilation
defects are identified within the upper lobes bilaterally.

Perfusion: No wedge shaped peripheral perfusion defects to suggest
acute pulmonary embolism.
IMPRESSION: Low probability for acute pulmonary embolus.

## 2017-05-25 NOTE — Anesthesia Postprocedure Evaluation (Signed)
Anesthesia Post Note  Patient: Colleen Lowery  Procedure(s) Performed: Procedure(s) (LRB): COLONOSCOPY WITH PROPOFOL (N/A) ESOPHAGOGASTRODUODENOSCOPY (EGD) WITH PROPOFOL (N/A)     Anesthesia Post Evaluation  Last Vitals:  Vitals:   02/16/17 1345 02/16/17 1350  BP:    Pulse: 63 (!) 57  Resp: 16 12  Temp:      Last Pain:  Vitals:   02/19/17 1240  TempSrc:   PainSc: 0-No pain                 Shyne Lehrke [place]

## 2017-05-25 NOTE — Addendum Note (Signed)
Addendum  created 05/25/17 1058 by Daymon Hora, MD   Sign clinical note    

## 2017-06-15 ENCOUNTER — Other Ambulatory Visit: Payer: Self-pay | Admitting: Family Medicine

## 2017-06-15 DIAGNOSIS — Z1231 Encounter for screening mammogram for malignant neoplasm of breast: Secondary | ICD-10-CM

## 2017-07-11 ENCOUNTER — Ambulatory Visit
Admission: RE | Admit: 2017-07-11 | Discharge: 2017-07-11 | Disposition: A | Discharge status: Home / Self Care | Payer: Medicare HMO | Source: Ambulatory Visit | Attending: Family Medicine | Admitting: Family Medicine

## 2017-07-11 DIAGNOSIS — Z1231 Encounter for screening mammogram for malignant neoplasm of breast: Secondary | ICD-10-CM

## 2017-10-11 DIAGNOSIS — E785 Hyperlipidemia, unspecified: Secondary | ICD-10-CM | POA: Insufficient documentation

## 2020-01-20 ENCOUNTER — Other Ambulatory Visit: Payer: Self-pay | Admitting: Family Medicine

## 2020-01-20 DIAGNOSIS — N644 Mastodynia: Secondary | ICD-10-CM

## 2020-02-04 ENCOUNTER — Encounter (HOSPITAL_COMMUNITY): Payer: Self-pay

## 2020-02-04 ENCOUNTER — Other Ambulatory Visit: Payer: Self-pay

## 2020-02-04 ENCOUNTER — Emergency Department (HOSPITAL_COMMUNITY)
Admission: EM | Admit: 2020-02-04 | Discharge: 2020-02-04 | Disposition: A | Discharge status: Home / Self Care | Payer: Medicare HMO | Attending: Emergency Medicine | Admitting: Emergency Medicine

## 2020-02-04 ENCOUNTER — Ambulatory Visit: Payer: Medicare HMO

## 2020-02-04 ENCOUNTER — Ambulatory Visit
Admission: RE | Admit: 2020-02-04 | Discharge: 2020-02-04 | Disposition: A | Discharge status: Home / Self Care | Payer: Medicare HMO | Source: Ambulatory Visit | Attending: Family Medicine | Admitting: Family Medicine

## 2020-02-04 DIAGNOSIS — Z91040 Latex allergy status: Secondary | ICD-10-CM | POA: Diagnosis not present

## 2020-02-04 DIAGNOSIS — Z7982 Long term (current) use of aspirin: Secondary | ICD-10-CM | POA: Insufficient documentation

## 2020-02-04 DIAGNOSIS — I5042 Chronic combined systolic (congestive) and diastolic (congestive) heart failure: Secondary | ICD-10-CM | POA: Diagnosis not present

## 2020-02-04 DIAGNOSIS — F17210 Nicotine dependence, cigarettes, uncomplicated: Secondary | ICD-10-CM | POA: Insufficient documentation

## 2020-02-04 DIAGNOSIS — R569 Unspecified convulsions: Secondary | ICD-10-CM | POA: Diagnosis present

## 2020-02-04 DIAGNOSIS — Z79899 Other long term (current) drug therapy: Secondary | ICD-10-CM | POA: Diagnosis not present

## 2020-02-04 DIAGNOSIS — N644 Mastodynia: Secondary | ICD-10-CM

## 2020-02-04 DIAGNOSIS — E119 Type 2 diabetes mellitus without complications: Secondary | ICD-10-CM | POA: Insufficient documentation

## 2020-02-04 DIAGNOSIS — I110 Hypertensive heart disease with heart failure: Secondary | ICD-10-CM | POA: Diagnosis not present

## 2020-02-04 LAB — CBC
HCT: 41.6 % (ref 36.0–46.0)
Hemoglobin: 13.9 g/dL (ref 12.0–15.0)
MCH: 30.8 pg (ref 26.0–34.0)
MCHC: 33.4 g/dL (ref 30.0–36.0)
MCV: 92 fL (ref 80.0–100.0)
Platelets: 264 10*3/uL (ref 150–400)
RBC: 4.52 MIL/uL (ref 3.87–5.11)
RDW: 13.2 % (ref 11.5–15.5)
WBC: 5.6 10*3/uL (ref 4.0–10.5)
nRBC: 0 % (ref 0.0–0.2)

## 2020-02-04 LAB — URINALYSIS, ROUTINE W REFLEX MICROSCOPIC
Bacteria, UA: NONE SEEN
Bilirubin Urine: NEGATIVE
Glucose, UA: 150 mg/dL — AB
Hgb urine dipstick: NEGATIVE
Ketones, ur: NEGATIVE mg/dL
Leukocytes,Ua: NEGATIVE
Nitrite: NEGATIVE
Protein, ur: 30 mg/dL — AB
Specific Gravity, Urine: 1.006 (ref 1.005–1.030)
pH: 6 (ref 5.0–8.0)

## 2020-02-04 LAB — CK: Total CK: 137 U/L (ref 38–234)

## 2020-02-04 LAB — BASIC METABOLIC PANEL
Anion gap: 11 (ref 5–15)
BUN: 15 mg/dL (ref 8–23)
CO2: 21 mmol/L — ABNORMAL LOW (ref 22–32)
Calcium: 9.3 mg/dL (ref 8.9–10.3)
Chloride: 102 mmol/L (ref 98–111)
Creatinine, Ser: 0.76 mg/dL (ref 0.44–1.00)
GFR calc Af Amer: >60 mL/min (ref >60)
GFR calc non Af Amer: >60 mL/min (ref >60)
Glucose, Bld: 184 mg/dL — ABNORMAL HIGH (ref 70–99)
Potassium: 4.1 mmol/L (ref 3.5–5.1)
Sodium: 134 mmol/L — ABNORMAL LOW (ref 135–145)

## 2020-02-04 LAB — RAPID URINE DRUG SCREEN, HOSP PERFORMED
Amphetamines: NOT DETECTED
Barbiturates: NOT DETECTED
Benzodiazepines: NOT DETECTED
Cocaine: NOT DETECTED
Opiates: NOT DETECTED
Tetrahydrocannabinol: NOT DETECTED

## 2020-02-04 LAB — LACTIC ACID, PLASMA: Lactic Acid, Venous: 1.8 mmol/L (ref 0.5–1.9)

## 2020-02-04 MED ORDER — SODIUM CHLORIDE 0.9 % IV BOLUS: 1000.0000 mL | Freq: Once | INTRAVENOUS | Status: DC

## 2020-02-04 NOTE — Discharge Instructions (Signed)
Please call your neurologist office tomorrow to discuss your episode today.  It is not clear if you had a true seizure.  You should ask your neurologist if they want you to restart Keppra.  Until you've talked to your neurologist, you should avoid driving or operating heavy equipment.

## 2020-02-04 NOTE — ED Triage Notes (Signed)
Pt BIBA from out pt imaging- pt was getting mammogram for left breast mass. In lobby, pt had a 7 minute seizure per staff, but no incontinence noted. Pt was lowered to ground by staff at office. Last seizure over 10 years ago.  Pt A&Ox4 with EMS. Pt denies any complaints at this, feels at baseline now.   FSBG 240 148/90 78 100%  16 RR  18LFA

## 2020-02-04 NOTE — ED Provider Notes (Signed)
[facility] DEPT Provider Note   CSN: [id number] Arrival date & time: 02/04/20  1341     History CC: Seizure like activity  Colleen Lowery is a 79 y.o. female who reports a history of "lifelong seizures" presenting to the ED with concern for possible seizure activity.  Patient was at an outpatient clinic today getting a mammogram for left breast mass.  Per EMS, she reportedly had a witnessed seizure episode lasting about 6 to 7 minutes by staff.  There was report of generalized tonic-clonic shaking.  She was lowered to the ground by staff; no head trauma.    The patient does not recall any prodrome, but tells me she thinks her trigger may have been the smell of [place].  She strongly believes that certain odors have always set off her seizures, and tells me blood draws also cause seizures.  Her most recent seizure was approximately in 2019 per her recollection. She says she been seen by neurology at [facility] prior to that, and deemed that there was "nothing more they can do from the office".  She is not on medications, but was on keppra in the past, and taken off by her neurologist a few years ago.  Currently she feels back to baseline.  She denies any incontinence.  She denies any headache.  She denies any muscle aches, chest pain, shortness of breath.  She denies any fevers or chills recently.  Last neuro office visit on 07/17/2018 with [facility] Haworth at [facility] on [address], who notes patient had a benign EEG, and had not recommended resuming AED's at that time.   HPI     Past Medical History:  Diagnosis Date  . Allergy   . Arthritis    FINGERS  . Depression   . Diabetes mellitus without complication ([place])    TYPE 2 , TAKING OTC MED FORGYMEMA  . GERD (gastroesophageal reflux disease)   . Heart failure, acute, systolic and diastolic ([place]) MARCH 6384 DX  . Hypertension   . 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 Thyroid  disease    AGE 41'S UNTIL  2016 OFF ALL THRYOID MEDS NOW     Patient Active Problem List   Diagnosis Date Noted  . Peripheral arterial disease ([place]) 06/14/2016  . Upper airway cough syndrome 06/08/2016  . CAD in native artery 05/31/2016  . 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 VT (ventricular tachycardia) ([place]) 02/09/2016  . Acute on chronic combined systolic and diastolic HF (heart failure) ([place]) 02/09/2016  . Contrast media allergy 02/09/2016  . Dyspnea 02/07/2016  . Benign essential HTN 02/07/2016  . Cigarette smoker 02/07/2016  . Diabetes ([place]) 08/02/2014  . Essential hypertension 08/02/2014    Past Surgical History:  Procedure Laterality Date  . ABDOMINAL HYSTERECTOMY     PARTIAL  . CARDIAC CATHETERIZATION  02/10/2016   Procedure: Right/Left Heart Cath and Coronary Angiography;  Surgeon: Leonie Man, MD;  Location: [facility];  Service: Cardiovascular;;  . COLONOSCOPY WITH PROPOFOL N/A 02/16/2017   Procedure: COLONOSCOPY WITH PROPOFOL;  Surgeon: Carol Ada, MD;  Location: [facility];  Service: Endoscopy;  Laterality: N/A;  . EP IMPLANTABLE DEVICE N/A 02/10/2016   Procedure: ICD Implant;  Surgeon: Will Meredith Leeds, MD;  Location: [facility];  Service: Cardiovascular;  Laterality: N/A;  . ESOPHAGOGASTRODUODENOSCOPY (EGD) WITH PROPOFOL N/A 02/16/2017   Procedure: ESOPHAGOGASTRODUODENOSCOPY (EGD) WITH PROPOFOL;  Surgeon: Carol Ada, MD;  Location: [facility];  Service: Endoscopy;  Laterality: N/A;  reflux  . TONSILLECTOMY       OB History   No obstetric history on file.     Family History  Problem Relation Age of Onset  . Diabetes Mother   . Stroke Mother   . Diabetes Brother   . Diabetes Maternal Grandmother   . Diabetes Paternal Grandfather   . Heart failure Paternal Grandfather   . Hypertension Brother   . Heart attack Neg Hx     Social History   Tobacco Use  . Smoking status: Current  Every Day Smoker    Packs/day: 1.00    Years: 50.00    Pack years: 50.00    Types: Cigarettes, Cigars  . Smokeless tobacco: Never Used  . Tobacco comment: NOW SMOKES CIGARS QUIT CIGARETTES 11-13-2016  Substance Use Topics  . Alcohol use: No  . Drug use: No    Home Medications Prior to Admission medications   Medication Sig Start Date End Date Taking? Authorizing Provider  Ascorbic Acid (VITAMIN C PO) Take 1 g by mouth daily.   Yes [provider]  aspirin 81 MG EC tablet Take 81 mg by mouth daily. 01/20/20  Yes [provider]  b complex vitamins tablet Take 1 tablet by mouth daily.   Yes [provider]  Beta Carotene (VITAMIN A) 25000 UNIT capsule Take 25,000 Units by mouth daily.    Yes [provider]  CALCIUM PO Take 1 Dose by mouth daily as needed (bone health).   Yes [provider]  Cholecalciferol (VITAMIN D) 10 MCG/ML LIQD Take 10 mcg by mouth daily.   Yes [provider]  Cholecalciferol (VITAMIN D3) 5000 units CAPS Take 5,000 Units by mouth daily as needed (bone health).   Yes [provider]  furosemide (LASIX) 20 MG tablet Take 20 mg by mouth daily.   Yes [provider]  Liver Extract (LIVER PO) Take 1 tablet by mouth daily as needed (liver swelling).    Yes [provider]  losartan (COZAAR) 50 MG tablet Take 50 mg by mouth daily. 12/16/19  Yes [provider]  MAGNESIUM PO Take 2.5 mLs by mouth daily.   Yes [provider]  Menaquinone-7 (VITAMIN K2 PO) Take 1 tablet by mouth daily as needed (low vitamin K).    Yes [provider]  Misc Natural Products (DUODENUM) TABS Take 1-5 tablets by mouth daily as needed (stomach swelling).   Yes [provider]  nitroGLYCERIN (NITROSTAT) 0.4 MG SL tablet Place 1 tablet (0.4 mg total) under the tongue every 5 (five) minutes x 3 doses as needed for chest pain. 02/12/16  Yes Brett Canales, PA-C  olopatadine (PATANOL) 0.1 %  ophthalmic solution Place 1 drop into both eyes daily as needed for allergies. 12/19/16  Yes [provider]  OVER THE COUNTER MEDICATION Take 1-5 tablets by mouth daily as needed (itching). Vitamin b5   Yes [provider]  OVER THE COUNTER MEDICATION Take 5-10 tablets by mouth as needed (mold induced itching). SOD superoxide dismutase   Yes [provider]  OVER THE COUNTER MEDICATION Take 10 tablets by mouth daily as needed (high blood pressure). Mega-zyme digestive enzyme if systolic bp is over 932    Yes [provider]  potassium chloride SA (K-DUR,KLOR-CON) 20 MEQ tablet Take 20 mEq by mouth 2 (two) times daily.    Yes [provider]  sotalol (BETAPACE)  80 MG tablet Take 1 tablet (80 mg total) by mouth 2 (two) times daily. 02/25/16  Yes Baldwin Jamaica, PA-C  vitamin E 400 UNIT capsule Take 400 Units by mouth daily as needed (low vitamin E).    Yes [provider]  famotidine (PEPCID) 20 MG tablet One at bedtime Patient not taking: Reported on 02/04/2020 06/08/16   Tanda Rockers, MD  rosuvastatin (CRESTOR) 20 MG tablet Take 20 mg by mouth at bedtime. 11/25/19   [provider]    Allergies    Contrast media [iodinated diagnostic agents], Purell instant hand [alcohol], Atorvastatin, Dust mite extract, Latex, and Molds & smuts  Review of Systems   Review of Systems  Constitutional: Negative for chills and fever.  Eyes: Negative for photophobia and visual disturbance.  Respiratory: Negative for cough and shortness of breath.   Cardiovascular: Negative for chest pain and palpitations.  Gastrointestinal: Negative for abdominal pain, nausea and vomiting.  Musculoskeletal: Negative for arthralgias and myalgias.  Skin: Negative for color change and rash.  Neurological: Positive for seizures. Negative for dizziness, syncope, facial asymmetry, weakness, light-headedness, numbness and headaches.  Psychiatric/Behavioral: Negative for  agitation and confusion.  All other systems reviewed and are negative.   Physical Exam Updated Vital Signs BP (!) 195/94   Pulse 66   Temp 98.5 F (36.9 C) (Oral)   Resp 13   Ht 5' 2"  (1.575 m)   Wt 65.8 kg   SpO2 100%   BMI 26.52 kg/m   Physical Exam Vitals and nursing note reviewed.  Constitutional:      General: She is not in acute distress.    Appearance: She is well-developed.  HENT:     Head: Normocephalic and atraumatic.  Eyes:     Conjunctiva/sclera: Conjunctivae normal.     Pupils: Pupils are equal, round, and reactive to light.  Cardiovascular:     Rate and Rhythm: Normal rate and regular rhythm.     Pulses: Normal pulses.  Pulmonary:     Effort: Pulmonary effort is normal. No respiratory distress.     Breath sounds: Normal breath sounds.  Abdominal:     Palpations: Abdomen is soft.     Tenderness: There is no abdominal tenderness.  Musculoskeletal:     Cervical back: Neck supple.  Skin:    General: Skin is warm and dry.  Neurological:     General: No focal deficit present.     Mental Status: She is alert and oriented to person, place, and time.     GCS: GCS eye subscore is 4. GCS verbal subscore is 5. GCS motor subscore is 6.     Cranial Nerves: Cranial nerves are intact.     Sensory: Sensation is intact.     Motor: Motor function is intact.     Gait: Gait is intact.  Psychiatric:        Mood and Affect: Mood normal.        Behavior: Behavior normal.     ED Results / Procedures / Treatments   Labs (all labs ordered are listed, but only abnormal results are displayed) Labs Reviewed  BASIC METABOLIC PANEL - Abnormal; Notable for the following components:      Result Value   Sodium 134 (*)    CO2 21 (*)    Glucose, Bld 184 (*)    All other components within normal limits  URINALYSIS, ROUTINE W REFLEX MICROSCOPIC - Abnormal; Notable for the following components:   Color, Urine STRAW (*)  Glucose, UA 150 (*)    Protein, ur 30 (*)    All  other components within normal limits  CBC  CK  LACTIC ACID, PLASMA  RAPID URINE DRUG SCREEN, HOSP PERFORMED    EKG EKG Interpretation  Date/Time:  Wednesday February 04 2020 14:36:27 EDT Ventricular Rate:  74 PR Interval:    QRS Duration: 111 QT Interval:  372 QTC Calculation: 413 R Axis:   -10 Text Interpretation: Sinus rhythm Incomplete left bundle branch block Probable LVH with secondary repol abnrm Inferior infarct, age indeterminate NO STEMI Confirmed by Octaviano Glow [phone]) on 02/04/2020 3:24:00 PM   Radiology No results found.  Procedures Procedures (including critical care time)  Medications Ordered in ED Medications - No data to display  ED Course  I have reviewed the triage vital signs and the nursing notes.  Pertinent labs & imaging results that were available during my care of the patient were reviewed by me and considered in my medical decision making (see chart for details).  79 yo female presenting to the ED with witnessed seizure-like activity lasting 7 minutes at outpatient medical clinic.  She feels back to her baseline.  There was no incontinence reported.  She denies mylagias or arthralgias.  She has no headache, and there was no report of head trauma.  I do not believe she needs an emergent CT scan as I have a low concern for intracranial mass or bleed.  Her labs are unremarkable.  Specifically, her CK and lactate are normal.  She also has no myalgias.  This seems quite inconsistent with a true tonic-clonic seizure.  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  Her history of seizures occurring to certain smells and IV draws is unusual, along with the fact she only ever has seizures in public settings (she says she has never had one at home). Although it is not clearly stipulated in her [facility] neurology notes, this sounds psychogenic to me.  Her outpatient seizure workup was benign, including EEG, and they took her off her Keppra and advised her she no  longer needs to follow up with them.  I would not restart keppra here.  She can call her neurologist office tomorrow to ask if they would re-start it.  Aside from seizure, I have a low suspicion for a cardiac event, given the tonic-clonic activity noted for 7 minutes. This would not be suspected in a cardiac arrhymthia or arrest, and I am doubtful she would be asymptomatic and normal lactate after a cardiac arrest or arrhythmia.  Stable, will discharge.  Final Clinical Impression(s) / ED Diagnoses Final diagnoses:  Seizure-like activity [facility])    Rx / [place] Orders ED Discharge Orders    None       Shardae Kleinman, Carola Rhine, MD 02/04/20 1904

## 2020-02-25 ENCOUNTER — Telehealth: Payer: Self-pay

## 2020-02-25 NOTE — Telephone Encounter (Signed)
The pt was trying to call [facility] but called [place] instead. She apologized and thanked me for the help.

## 2020-03-22 ENCOUNTER — Other Ambulatory Visit: Payer: Self-pay

## 2020-03-22 ENCOUNTER — Other Ambulatory Visit: Payer: Self-pay | Admitting: Family Medicine

## 2020-03-22 ENCOUNTER — Ambulatory Visit
Admission: RE | Admit: 2020-03-22 | Discharge: 2020-03-22 | Disposition: A | Discharge status: Home / Self Care | Payer: Medicare HMO | Source: Ambulatory Visit | Attending: Family Medicine | Admitting: Family Medicine

## 2020-03-22 DIAGNOSIS — N644 Mastodynia: Secondary | ICD-10-CM

## 2020-03-31 ENCOUNTER — Ambulatory Visit
Admission: RE | Admit: 2020-03-31 | Discharge: 2020-03-31 | Disposition: A | Discharge status: Home / Self Care | Payer: Medicare HMO | Source: Ambulatory Visit | Attending: Family Medicine | Admitting: Family Medicine

## 2020-03-31 ENCOUNTER — Other Ambulatory Visit: Payer: Self-pay

## 2020-03-31 ENCOUNTER — Other Ambulatory Visit: Payer: Self-pay | Admitting: Family Medicine

## 2020-03-31 DIAGNOSIS — N644 Mastodynia: Secondary | ICD-10-CM

## 2020-04-05 ENCOUNTER — Ambulatory Visit: Payer: Self-pay | Admitting: Surgery

## 2020-04-05 ENCOUNTER — Encounter: Payer: Self-pay | Admitting: *Deleted

## 2020-04-05 ENCOUNTER — Telehealth: Payer: Self-pay | Admitting: Hematology and Oncology

## 2020-04-05 NOTE — H&P (Signed)
Higinio Roger Miggins Appointment: 04/05/2020 2:20 PM Location: [facility] Patient #: [id number] DOB: 1941/07/14 Single / Language: [name] / Race: Black or African American Female  History of Present Illness Marcello Moores A. Thanvi Blincoe MD; 04/05/2020 3:19 PM) Patient words: Patient returns for evaluation of a thickened swollen left breast 6 months. She noticed her breast being swollen and somewhat sore about 6 months ago. This progressed until she sought medical care. Screening a mammogram which showed diffuse skin thickening and trabecular thickening without discrete mass. Ultrasound done which showed more the same involving most of her left breast with orange peel change. She's had no nipple discharge. She also complained of a mass in her left axilla.        ADDITIONAL INFORMATION: 1. PROGNOSTIC INDICATORS Results: IMMUNOHISTOCHEMICAL AND MORPHOMETRIC ANALYSIS PERFORMED MANUALLY The tumor cells are NEGATIVE for Her2 (1+). Estrogen Receptor: 30%, POSITIVE, STRONG STAINING INTENSITY Progesterone Receptor: 10%, POSITIVE, STRONG STAINING INTENSITY Proliferation Marker Ki67: 15% REFERENCE RANGE ESTROGEN RECEPTOR NEGATIVE 0% POSITIVE =>1% REFERENCE RANGE PROGESTERONE RECEPTOR NEGATIVE 0% POSITIVE =>1% All controls stained appropriately Vicente Males MD Pathologist, Electronic Signature ( Signed 04/02/2020) FINAL DIAGNOSIS Diagnosis 1. Breast, left, needle core biopsy, 2 o'clock - INVASIVE DUCTAL CARCINOMA, SEE COMMENT. - LYMPHOVASCULAR INVASION PRESENT. 2. 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 1 of 3 FINAL for PETRINA, MELBY ([id number]) Microscopic Comment 1. The carcinoma appears grade 2 and measures 17 mm in greatest linear extent. Prognostic makers will be ordered. Dr. Lyndon Code has reviewed the case. The case was called to [facility] on 04/01/2020. Vicente Males MD Pathologist, Electronic Signature (Case  signed 04/01/2020)      Patient complains of diffuse swelling of the left breast and a palpable mass in the lateral aspect of the breast.  EXAM: DIGITAL DIAGNOSTIC BILATERAL MAMMOGRAM WITH CAD AND TOMO  ULTRASOUND LEFT BREAST  COMPARISON: Previous exam(s).  ACR Breast Density Category b: There are scattered areas of fibroglandular density.  FINDINGS: No suspicious mass or malignant type microcalcifications identified in the right breast.  There is diffuse trabecular thickening throughout the left breast with associated skin thickening. There is asymmetry with spiculation in the lateral aspect of the left breast.  Mammographic images were processed with CAD.  On physical exam, there is thickening of the left breast with peau d'orange appearance. The entire breast feels heavy with more focal thickening in the lateral aspect of the breast.  Targeted ultrasound is performed, showing diffuse trabecular and skin thickening throughout the left breast. There are more focal areas of irregular hypoechogenicity in the 2-3 o'clock region of the breast. There is an irregular area of hypoechogenicity at 2 o'clock 10 cm from the nipple with measurements difficult to give but measures at least 4.9 cm. Sonographic evaluation of the left axilla showed at least 4 abnormal lymph nodes with cortical thickening measuring up to 8 mm.  IMPRESSION: Imaging findings are worrisome for inflammatory breast cancer and metastatic adenopathy.  RECOMMENDATION: Ultrasound-guided core biopsy of the 2 o'clock region of the left breast 10 cm from the nipple an enlarged axillary lymph node is recommended.  I have discussed the findings and recommendations with the patient. If applicable, a reminder letter will be sent to the patient regarding the next appointment.  BI-RADS CATEGORY 5: Highly suggestive of malignancy.   Electronically Signed By: Lillia Mountain M.D. On: 03/22/2020 10:48.  The  patient is a 79 year old female.   Past Surgical History Sharyn Lull  R. Rolena Infante, CMA; 04/05/2020 3:18 PM) Hysterectomy (not due to cancer) - Partial Tonsillectomy  Diagnostic Studies History Sharyn Lull R. Brooks, CMA; 04/05/2020 3:18 PM) Colonoscopy 1-5 years ago Pap Smear >5 years ago  Allergies (Tanisha A. Owens Shark, [place]; 04/05/2020 2:16 PM) Latex Atorvastatin Calcium *ANTIHYPERLIPIDEMICS* Allergies Reconciled  Medication History (Tanisha A. Owens Shark, [place]; 04/05/2020 2:15 PM) Aspirin Low Dose (81MG Tablet DR, Oral) Active. Furosemide (20MG Tablet, Oral) Active. Losartan Potassium (50MG Tablet, Oral) Active. Potassium Chloride Crys ER (20MEQ Tablet ER, Oral) Active. Sotalol HCl (80MG Tablet, Oral) Active. Medications Reconciled  Social History Sharyn Lull R. Brooks, CMA; 04/05/2020 3:18 PM) Alcohol use Remotely quit alcohol use. Tobacco use Current some day smoker.  Family History Sharyn Lull R. Rolena Infante, CMA; 04/05/2020 3:18 PM) Cerebrovascular Accident Mother. Heart Disease Brother. Kidney Disease Brother.  Pregnancy / Birth History Sharyn Lull R. Rolena Infante, CMA; 04/05/2020 3:18 PM) Age at menarche 30 years. Age of menopause 51-55 Contraceptive History Contraceptive implant, Oral contraceptives. Length (months) of breastfeeding 3-6 Maternal age 72-25  Other Problems Sharyn Lull R. Brooks, CMA; 04/05/2020 3:18 PM) Alcohol Abuse Chronic Obstructive Lung Disease Congestive Heart Failure Depression Gastric Ulcer Hemorrhoids High blood pressure Hypercholesterolemia Thyroid Disease Umbilical Hernia Repair     Review of Systems Sharyn Lull R. Brooks CMA; 04/05/2020 3:18 PM) General Not Present- Appetite Loss, Chills, Fatigue, Fever, Night Sweats, Weight Gain and Weight Loss. Skin Present- Dryness. Not Present- Change in Wart/Mole, Hives, Jaundice, New Lesions, Non-Healing Wounds, Rash and Ulcer. HEENT Present- Seasonal Allergies, Sinus Pain, Visual Disturbances and  Wears glasses/contact lenses. Not Present- Earache, Hearing Loss, Hoarseness, Nose Bleed, Oral Ulcers, Ringing in the Ears, Sore Throat and Yellow Eyes. Breast Present- Breast Mass and Breast Pain. Not Present- Nipple Discharge and Skin Changes. Gastrointestinal Present- Constipation. Not Present- Abdominal Pain, Bloating, Bloody Stool, Change in Bowel Habits, Chronic diarrhea, Difficulty Swallowing, Excessive gas, Gets full quickly at meals, Hemorrhoids, Indigestion, Nausea, Rectal Pain and Vomiting. Female Genitourinary Present- Frequency and Urgency. Not Present- Nocturia, Painful Urination and Pelvic Pain. Neurological Present- Decreased Memory and Seizures. Not Present- Fainting, Headaches, Numbness, Tingling, Tremor, Trouble walking and Weakness. Endocrine Not Present- Cold Intolerance, Excessive Hunger, Hair Changes, Heat Intolerance, Hot flashes and New Diabetes. Hematology Present- Gland problems. Not Present- Blood Thinners, Easy Bruising, Excessive bleeding, HIV and Persistent Infections.  Vitals (Tanisha A. Brown RMA; 04/05/2020 2:16 PM) 04/05/2020 2:16 PM Weight: 147 lb Height: 62in Body Surface Area: 1.68 m Body Mass Index: 26.89 kg/m  Temp.: 27F  Pulse: 86 (Regular)  BP: 134/82(Sitting, Left Arm, Standard)        Physical Exam (Carleena Mires A. Gitty Osterlund MD; 04/05/2020 3:16 PM)  General Note: Examination wheelchair  Head and Neck Head-normocephalic, atraumatic with no lesions or palpable masses.  Chest and Lung Exam Chest and lung exam reveals -quiet, even and easy respiratory effort with no use of accessory muscles and on auscultation, normal breath sounds, no adventitious sounds and normal vocal resonance. Inspection Chest Wall - Normal. Back - normal. Note: Left chest defibrillator noted.  Breast Note: Thickened left breast skin and nipple with darkening and orange peel change. This involves majority of the left breast. Entire breast is hard diffusely  without discrete mass. Right breast is normal.  Cardiovascular Cardiovascular examination reveals -on palpation PMI is normal in location and amplitude, no palpable S3 or S4. Normal cardiac borders., normal heart sounds, regular rate and rhythm with no murmurs, carotid auscultation reveals no bruits and normal pedal pulses bilaterally.  Neurologic Neurologic evaluation reveals -alert and oriented x 3 with no  impairment of recent or remote memory. Mental Status-Normal.  Lymphatic Axillary -Note:Significant left axillary adenopathy noted.     Assessment & Plan (Sheriann Newmann A. Jamail Cullers MD; 04/05/2020 3:19 PM)  INFLAMMATORY BREAST CANCER, LEFT (C50.912) Impression: Examination supports left breast inflammatory cancer. Her markers are not the typical triple negative pattern but clinically this appears to be an inflammatory left breast cancer with significant left axillary adenopathy Referral to medical and radiation oncology. She more likely will need a port for chemotherapy but will wait to hear from them. I discussed port placement. She will need probably a modified radical mastectomy and therapies complete on the left side. Radiation oncology wouldn't comment about the need for radiation therapy with the left-sided defibrillator in place. This may need to be moved by cardiology at some point in time. Pt requires port placement for chemotherapy. Risk include bleeding, infection, pneumothorax, hemothorax, mediastinal injury, nerve injury , blood vessel injury, stroke, blood clots, death, migration. embolization and need for additional procedures. Pt agrees to proceed.  total time 45 minutes  Current Plans Pt Education - [employer] Free Text Education/Instructions: discussed with patient and provided information. Pt Education - [place]

## 2020-04-05 NOTE — Telephone Encounter (Signed)
Ms. Boehne returned my call to confirm her appt w/Dr. Lindi Adie on 5/26 at 345pm. Aware to arrive 15 minutes early.

## 2020-04-06 NOTE — Progress Notes (Signed)
[facility] CONSULT NOTE  Patient Care Team: [name], MD as PCP - General (Family Medicine)  CHIEF COMPLAINTS/PURPOSE OF CONSULTATION:  Newly diagnosed breast cancer  HISTORY OF PRESENTING ILLNESS:  Colleen Lowery 79 y.o. female is here because of recent diagnosis of invasive ductal carcinoma of the left breast. Patient palpated a left breast mass and noted diffuse left breast swelling. Diagnostic mammogram and [place] on 03/22/20 showed skin thickening and two areas of irregular hypoechogenicity in the 2-3 o'clock region, one measuring 4.9cm, and at least 4 abnormal lymph nodes in the left axilla with cortical thickening. 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, grade 2, HER-2 negative (1+), ER+ 30%, PR+ 10%, Ki67 15%. She presents to the clinic today for initial evaluation and discussion of treatment options.  She felt a nodule in December 2020.  It was on the lateral aspect of the left breast but it continued to increase over time. Patient tells me that she is extremely sensitive to chemicals like Purell as well as even several external stimuli.  She tells me that she even had seizures at the breast center and required an emergency room visit.  Because of this her whole treatment biopsies were delayed.  I reviewed her records extensively and collaborated the history with the patient.  SUMMARY OF ONCOLOGIC HISTORY: Oncology History  Malignant neoplasm of upper-outer quadrant of left breast in female, estrogen receptor positive ([place])  03/22/2020 Initial Diagnosis   Diffuse left breast swelling. skin thickening and two areas of irregular hypoechogenicity in the 2-3 o'clock region, 4.9cm, and at least 4 abnormal lymph nodes  with cortical thickening. 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, grade 2, HER-2 negative (1+), ER+ 30%, PR+ 10%, Ki67 15%   04/07/2020 Cancer Staging   Staging form: Breast, AJCC 8th  Edition - Clinical stage from 04/07/2020: Stage IIIB (cT4b, cN1, cM0, G2, ER+, PR+, HER2-) - Signed by Nicholas Lose, MD on 04/07/2020     MEDICAL HISTORY:  Past Medical History:  Diagnosis Date  . Allergy   . Arthritis    FINGERS  . Depression   . Diabetes mellitus without complication ([place])    TYPE 2 , TAKING OTC MED FORGYMEMA  . GERD (gastroesophageal reflux disease)   . Heart failure, acute, systolic and diastolic ([place]) MARCH 3557 DX  . Hypertension   . 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 Thyroid disease    AGE 34'S UNTIL  2016 OFF ALL THRYOID MEDS NOW     SURGICAL HISTORY: Past Surgical History:  Procedure Laterality Date  . ABDOMINAL HYSTERECTOMY     PARTIAL  . CARDIAC CATHETERIZATION  02/10/2016   Procedure: Right/Left Heart Cath and Coronary Angiography;  Surgeon: Leonie Man, MD;  Location: [facility];  Service: Cardiovascular;;  . COLONOSCOPY WITH PROPOFOL N/A 02/16/2017   Procedure: COLONOSCOPY WITH PROPOFOL;  Surgeon: Carol Ada, MD;  Location: [facility];  Service: Endoscopy;  Laterality: N/A;  . EP IMPLANTABLE DEVICE N/A 02/10/2016   Procedure: ICD Implant;  Surgeon: Will Meredith Leeds, MD;  Location: [facility];  Service: Cardiovascular;  Laterality: N/A;  . ESOPHAGOGASTRODUODENOSCOPY (EGD) WITH PROPOFOL N/A 02/16/2017   Procedure: ESOPHAGOGASTRODUODENOSCOPY (EGD) WITH PROPOFOL;  Surgeon: Carol Ada, MD;  Location: [facility];  Service: Endoscopy;  Laterality: N/A;  reflux  . TONSILLECTOMY      SOCIAL HISTORY: Social History  Socioeconomic History  . Marital status: Single    Spouse name: Not on file  . Number of children: Not on file  . Years of education: Not on file  . Highest education level: Not on file  Occupational History  . Not on file  Tobacco Use  . Smoking status: Current Every Day Smoker    Packs/day: 1.00    Years: 50.00    Pack years: 50.00    Types: Cigarettes, Cigars  .  Smokeless tobacco: Never Used  . Tobacco comment: NOW SMOKES CIGARS QUIT CIGARETTES 11-13-2016  Substance and Sexual Activity  . Alcohol use: No  . Drug use: No  . Sexual activity: Never  Other Topics Concern  . Not on file  Social History Narrative  . Not on file   Social Determinants of Health   Financial Resource Strain:   . Difficulty of Paying Living Expenses:   Food Insecurity:   . Worried About Charity fundraiser in the Last Year:   . Arboriculturist in the Last Year:   Transportation Needs:   . Film/video editor (Medical):   [name] Lack of Transportation (Non-Medical):   Physical Activity:   . Days of Exercise per Week:   . Minutes of Exercise per Session:   Stress:   . Feeling of Stress :   Social Connections:   . Frequency of Communication with Friends and Family:   . Frequency of Social Gatherings with Friends and Family:   . Attends Religious Services:   . Active Member of Clubs or Organizations:   . Attends Archivist Meetings:   [name] Marital Status:   Intimate Partner Violence:   . Fear of Current or Ex-Partner:   . Emotionally Abused:   [name] Physically Abused:   . Sexually Abused:     FAMILY HISTORY: Family History  Problem Relation Age of Onset  . Diabetes Mother   . Stroke Mother   . Diabetes Brother   . Diabetes Maternal Grandmother   . Diabetes Paternal Grandfather   . Heart failure Paternal Grandfather   . Hypertension Brother   . Heart attack Neg Hx     ALLERGIES:  is allergic to contrast media [iodinated diagnostic agents]; purell instant hand [alcohol]; atorvastatin; dust mite extract; latex; and molds & smuts.  MEDICATIONS:  Current Outpatient Medications  Medication Sig Dispense Refill  . Ascorbic Acid (VITAMIN C PO) Take 1 g by mouth daily.    [name] aspirin 81 MG EC tablet Take 81 mg by mouth daily.    [name] b complex vitamins tablet Take 1 tablet by mouth daily.    . Beta Carotene (VITAMIN A) 25000 UNIT capsule Take 25,000 Units by  mouth daily.     [name] CALCIUM PO Take 1 Dose by mouth daily as needed (bone health).    . Cholecalciferol (VITAMIN D) 10 MCG/ML LIQD Take 10 mcg by mouth daily.    . Cholecalciferol (VITAMIN D3) 5000 units CAPS Take 5,000 Units by mouth daily as needed (bone health).    . famotidine (PEPCID) 20 MG tablet One at bedtime (Patient not taking: Reported on 02/04/2020) 30 tablet 11  . furosemide (LASIX) 20 MG tablet Take 20 mg by mouth daily.    . Liver Extract (LIVER PO) Take 1 tablet by mouth daily as needed (liver swelling).     [name] losartan (COZAAR) 50 MG tablet Take 50 mg by mouth daily.    [name] MAGNESIUM PO Take 2.5 mLs by mouth  daily.    . Menaquinone-7 (VITAMIN K2 PO) Take 1 tablet by mouth daily as needed (low vitamin K).     . Misc Natural Products (DUODENUM) TABS Take 1-5 tablets by mouth daily as needed (stomach swelling).    . nitroGLYCERIN (NITROSTAT) 0.4 MG SL tablet Place 1 tablet (0.4 mg total) under the tongue every 5 (five) minutes x 3 doses as needed for chest pain. 25 tablet 12  . olopatadine (PATANOL) 0.1 % ophthalmic solution Place 1 drop into both eyes daily as needed for allergies.    [name] OVER THE COUNTER MEDICATION Take 1-5 tablets by mouth daily as needed (itching). Vitamin b5    . OVER THE COUNTER MEDICATION Take 5-10 tablets by mouth as needed (mold induced itching). SOD superoxide dismutase    . OVER THE COUNTER MEDICATION Take 10 tablets by mouth daily as needed (high blood pressure). Mega-zyme digestive enzyme if systolic bp is over 381     . potassium chloride SA (K-DUR,KLOR-CON) 20 MEQ tablet Take 20 mEq by mouth 2 (two) times daily.     . rosuvastatin (CRESTOR) 20 MG tablet Take 20 mg by mouth at bedtime.    . sotalol (BETAPACE) 80 MG tablet Take 1 tablet (80 mg total) by mouth 2 (two) times daily. 60 tablet 6  . vitamin E 400 UNIT capsule Take 400 Units by mouth daily as needed (low vitamin E).      No current facility-administered medications for this visit.    REVIEW OF  SYSTEMS:   Constitutional: Denies fevers, chills or abnormal night sweats Eyes: Denies blurriness of vision, double vision or watery eyes Ears, nose, mouth, throat, and face: Denies mucositis or sore throat Respiratory: Denies cough, dyspnea or wheezes Cardiovascular: Denies palpitation, chest discomfort or lower extremity swelling Gastrointestinal:  Denies nausea, heartburn or change in bowel habits Skin: Denies abnormal skin rashes Lymphatics: Denies new lymphadenopathy or easy bruising Neurological:Denies numbness, tingling or new weaknesses Behavioral/Psych: Mood is stable, no new changes  Breast: Palpable left breast mass and edema All other systems were reviewed with the patient and are negative.  PHYSICAL EXAMINATION: ECOG PERFORMANCE STATUS: 2 - Symptomatic, <50% confined to bed  Vitals:   04/07/20 1600  BP: (!) 185/97  Pulse: 70  Resp: 17  Temp: 98.2 F (36.8 C)  SpO2: 100%   Filed Weights   04/07/20 1600  Weight: 142 lb 6.4 oz (64.6 kg)    GENERAL:alert, no distress and comfortable SKIN: skin color, texture, turgor are normal, no rashes or significant lesions EYES: normal, conjunctiva are pink and non-injected, sclera clear OROPHARYNX:no exudate, no erythema and lips, buccal mucosa, and tongue normal  NECK: supple, thyroid normal size, non-tender, without nodularity LYMPH:  no palpable lymphadenopathy in the cervical, axillary or inguinal LUNGS: clear to auscultation and percussion with normal breathing effort HEART: regular rate & rhythm and no murmurs and no lower extremity edema ABDOMEN:abdomen soft, non-tender and normal bowel sounds Musculoskeletal:no cyanosis of digits and no clubbing  PSYCH: alert & oriented x 3 with fluent speech NEURO: no focal motor/sensory deficits BREAST: Skin thickening with Peau de Orange suggestive of inflammatory breast cancer (exam performed in the presence of a chaperone)   LABORATORY DATA:  I have reviewed the data as  listed Lab Results  Component Value Date   WBC 5.6 02/04/2020   HGB 13.9 02/04/2020   HCT 41.6 02/04/2020   MCV 92.0 02/04/2020   PLT 264 02/04/2020   Lab Results  Component Value Date  NA 134 (L) 02/04/2020   K 4.1 02/04/2020   CL 102 02/04/2020   CO2 21 (L) 02/04/2020    RADIOGRAPHIC STUDIES: I have personally reviewed the radiological reports and agreed with the findings in the report.  ASSESSMENT AND PLAN:  Malignant neoplasm of upper-outer quadrant of left breast in female, estrogen receptor positive ([place]) 03/22/2020:diffuse left breast swelling. skin thickening and two areas of irregular hypoechogenicity in the 2-3 o'clock region, 4.9cm, and at least 4 abnormal lymph nodes  with cortical thickening. 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, grade 2, HER-2 negative (1+), ER+ 30%, PR+ 10%, Ki67 15% T4BN1 stage IIIb clinical stage Skin invasion versus inflammatory breast cancer  Pathology and radiology counseling: Discussed with the patient, the details of pathology including the type of breast cancer,the clinical staging, the significance of ER, PR and HER-2/neu receptors and the implications for treatment. After reviewing the pathology in detail, we proceeded to discuss the different treatment options between surgery, radiation, chemotherapy, antiestrogen therapies.  Recommendation: Staging CT scans and bone scan 1.  Neoadjuvant chemotherapy 2.  Mastectomy with axillary lymph node dissection 3.  Adjuvant radiation 4.  Followed by adjuvant antiestrogen therapy  Chemo counseling: I discussed with the patient pros and cons of doing chemotherapy with CM[facility].  She is not capable of tolerating a more aggressive treatment regimen.  Port placement, chemo class Follow-up in 1 to 2 weeks to start chemotherapy.   All questions were answered. The patient knows to call the clinic with any problems, questions or concerns.   Rulon Eisenmenger, MD,  MPH 04/07/2020    I, Molly Dorshimer, am acting as scribe for Nicholas Lose, MD. what is happening with this pleural okay  I have reviewed the above documentation for accuracy and completeness, and I agree with the above.

## 2020-04-07 ENCOUNTER — Encounter: Payer: Self-pay | Admitting: *Deleted

## 2020-04-07 ENCOUNTER — Other Ambulatory Visit: Payer: Self-pay | Admitting: Hematology and Oncology

## 2020-04-07 ENCOUNTER — Other Ambulatory Visit: Payer: Self-pay

## 2020-04-07 ENCOUNTER — Inpatient Hospital Stay: Payer: Medicare HMO | Attending: Hematology and Oncology | Admitting: Hematology and Oncology

## 2020-04-07 DIAGNOSIS — Z170 Estrogen receptor positive status [ER+]: Secondary | ICD-10-CM | POA: Diagnosis not present

## 2020-04-07 DIAGNOSIS — Z79899 Other long term (current) drug therapy: Secondary | ICD-10-CM

## 2020-04-07 DIAGNOSIS — C50412 Malignant neoplasm of upper-outer quadrant of left female breast: Secondary | ICD-10-CM

## 2020-04-07 DIAGNOSIS — I110 Hypertensive heart disease with heart failure: Secondary | ICD-10-CM | POA: Insufficient documentation

## 2020-04-07 DIAGNOSIS — E079 Disorder of thyroid, unspecified: Secondary | ICD-10-CM

## 2020-04-07 DIAGNOSIS — F17290 Nicotine dependence, other tobacco product, uncomplicated: Secondary | ICD-10-CM | POA: Diagnosis not present

## 2020-04-07 DIAGNOSIS — E119 Type 2 diabetes mellitus without complications: Secondary | ICD-10-CM | POA: Insufficient documentation

## 2020-04-07 MED ORDER — ONDANSETRON HCL 8 MG PO TABS: 8.0000 mg | ORAL_TABLET | Freq: Two times a day (BID) | ORAL | 1 refills | Status: DC | PRN

## 2020-04-07 MED ORDER — PROCHLORPERAZINE MALEATE 10 MG PO TABS: 10.0000 mg | ORAL_TABLET | Freq: Four times a day (QID) | ORAL | 1 refills | Status: DC | PRN

## 2020-04-07 MED ORDER — LIDOCAINE-PRILOCAINE 2.5-2.5 % EX CREA: TOPICAL_CREAM | CUTANEOUS | 3 refills | Status: DC

## 2020-04-07 NOTE — Progress Notes (Signed)
START OFF PATHWAY REGIMEN - Breast   [id number]:CMF (IV cyclophosphamide) q21 days:   A cycle is every 21 days:     Cyclophosphamide      Methotrexate      Fluorouracil   **Always confirm dose/schedule in your pharmacy ordering system**  Patient Characteristics: Preoperative or Nonsurgical Candidate (Clinical Staging), Neoadjuvant Therapy followed by Surgery, Invasive Disease, Chemotherapy, HER2 Negative/Unknown/Equivocal, ER Positive Therapeutic Status: Preoperative or Nonsurgical Candidate (Clinical Staging) AJCC M Category: cM0 AJCC Grade: G2 Breast Surgical Plan: Neoadjuvant Therapy followed by Surgery ER Status: Positive (+) AJCC 8 Stage Grouping: IIIB HER2 Status: Negative (-) AJCC T Category: cT4b AJCC N Category: cN1 PR Status: Positive (+) Intent of Therapy: Curative Intent, Discussed with Patient

## 2020-04-07 NOTE — Assessment & Plan Note (Signed)
03/22/2020:diffuse left breast swelling. skin thickening and two areas of irregular hypoechogenicity in the 2-3 o'clock region, 4.9cm, and at least 4 abnormal lymph nodes  with cortical thickening. 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, grade 2, HER-2 negative (1+), ER+ 30%, PR+ 10%, Ki67 15% T4BN1 stage IIIb clinical stage Skin invasion versus inflammatory breast cancer  Pathology and radiology counseling: Discussed with the patient, the details of pathology including the type of breast cancer,the clinical staging, the significance of ER, PR and HER-2/neu receptors and the implications for treatment. After reviewing the pathology in detail, we proceeded to discuss the different treatment options between surgery, radiation, chemotherapy, antiestrogen therapies.  Recommendation: Staging CT scans and bone scan 1.  Neoadjuvant chemotherapy 2.  Mastectomy with axillary lymph node dissection 3.  Adjuvant radiation 4.  Followed by adjuvant antiestrogen therapy  Chemo counseling: I discussed with the patient pros and cons of doing chemotherapy with CMF.  She is not capable of tolerating a more aggressive treatment regimen.  Port placement, chemo class Follow-up in 1 to 2 weeks to start chemotherapy.

## 2020-04-08 ENCOUNTER — Other Ambulatory Visit: Payer: Self-pay | Admitting: *Deleted

## 2020-04-08 DIAGNOSIS — Z170 Estrogen receptor positive status [ER+]: Secondary | ICD-10-CM

## 2020-04-08 DIAGNOSIS — C50412 Malignant neoplasm of upper-outer quadrant of left female breast: Secondary | ICD-10-CM

## 2020-04-09 ENCOUNTER — Other Ambulatory Visit: Payer: Self-pay | Admitting: *Deleted

## 2020-04-09 ENCOUNTER — Encounter: Payer: Self-pay | Admitting: *Deleted

## 2020-04-09 MED ORDER — PREDNISONE 50 MG PO TABS
ORAL_TABLET | ORAL | 0 refills | Status: DC
Start: 2020-04-09 — End: 2020-06-21

## 2020-04-09 MED ORDER — PREDNISONE 20 MG PO TABS: 20.0000 mg | ORAL_TABLET | Freq: Every day | ORAL | 0 refills | Status: DC

## 2020-04-09 NOTE — Progress Notes (Signed)
Spoke with patient about scans. I was able to get these scheduled for 6/1 at 730am. I have gave her the instructions for drinking her contrast (this was given to her 5/26) and for the CT and times for scans.  She will also require a prep due to contrast allergy. I have verified with radiology that she can still have scans with the prep.  Instructions given for the prep and prednisone called into her pharmacy.  She states she also is having some throat spasms from being at the cancer center of Wednesday and wanted a prescription for prednisone to take for the next few days.  Okayed by Ria Comment 41m x5 days and I will call this in as well.  Instructed her not take the 245mof prednisone the day she starts her prep 5/31. Patient verbalized understanding. I will also inform radiology of her sensitivities to purrell and othe smells per patient.

## 2020-04-13 ENCOUNTER — Encounter (HOSPITAL_COMMUNITY)
Admission: RE | Admit: 2020-04-13 | Discharge: 2020-04-13 | Disposition: A | Discharge status: Home / Self Care | Payer: Medicare HMO | Source: Ambulatory Visit | Attending: Hematology and Oncology | Admitting: Hematology and Oncology

## 2020-04-13 ENCOUNTER — Encounter (HOSPITAL_COMMUNITY): Payer: Self-pay

## 2020-04-13 ENCOUNTER — Other Ambulatory Visit: Payer: Self-pay

## 2020-04-13 ENCOUNTER — Ambulatory Visit (HOSPITAL_COMMUNITY)
Admission: RE | Admit: 2020-04-13 | Discharge: 2020-04-13 | Disposition: A | Discharge status: Home / Self Care | Payer: Medicare HMO | Source: Ambulatory Visit | Attending: Hematology and Oncology | Admitting: Hematology and Oncology

## 2020-04-13 ENCOUNTER — Other Ambulatory Visit (HOSPITAL_COMMUNITY)
Admission: RE | Admit: 2020-04-13 | Discharge: 2020-04-13 | Disposition: A | Discharge status: Home / Self Care | Payer: Medicare HMO | Source: Ambulatory Visit | Attending: Surgery | Admitting: Surgery

## 2020-04-13 ENCOUNTER — Encounter: Payer: Self-pay | Admitting: *Deleted

## 2020-04-13 ENCOUNTER — Other Ambulatory Visit: Payer: Self-pay | Admitting: *Deleted

## 2020-04-13 DIAGNOSIS — C50412 Malignant neoplasm of upper-outer quadrant of left female breast: Secondary | ICD-10-CM | POA: Diagnosis present

## 2020-04-13 DIAGNOSIS — Z01812 Encounter for preprocedural laboratory examination: Secondary | ICD-10-CM | POA: Diagnosis not present

## 2020-04-13 DIAGNOSIS — N289 Disorder of kidney and ureter, unspecified: Secondary | ICD-10-CM | POA: Diagnosis not present

## 2020-04-13 DIAGNOSIS — R911 Solitary pulmonary nodule: Secondary | ICD-10-CM | POA: Diagnosis not present

## 2020-04-13 DIAGNOSIS — Z170 Estrogen receptor positive status [ER+]: Secondary | ICD-10-CM

## 2020-04-13 DIAGNOSIS — I77811 Abdominal aortic ectasia: Secondary | ICD-10-CM | POA: Insufficient documentation

## 2020-04-13 DIAGNOSIS — I700 Atherosclerosis of aorta: Secondary | ICD-10-CM | POA: Diagnosis not present

## 2020-04-13 DIAGNOSIS — R590 Localized enlarged lymph nodes: Secondary | ICD-10-CM | POA: Diagnosis not present

## 2020-04-13 DIAGNOSIS — S2241XA Multiple fractures of ribs, right side, initial encounter for closed fracture: Secondary | ICD-10-CM | POA: Insufficient documentation

## 2020-04-13 DIAGNOSIS — R234 Changes in skin texture: Secondary | ICD-10-CM | POA: Diagnosis not present

## 2020-04-13 DIAGNOSIS — X58XXXA Exposure to other specified factors, initial encounter: Secondary | ICD-10-CM | POA: Insufficient documentation

## 2020-04-13 DIAGNOSIS — Z20822 Contact with and (suspected) exposure to covid-19: Secondary | ICD-10-CM | POA: Diagnosis not present

## 2020-04-13 LAB — POCT I-STAT CREATININE: Creatinine, Ser: 0.9 mg/dL (ref 0.44–1.00)

## 2020-04-13 MED ORDER — TECHNETIUM TC 99M MEDRONATE IV KIT
19.8000 | PACK | Freq: Once | INTRAVENOUS | Status: AC | PRN
  Administered 2020-04-13: 19.8 via INTRAVENOUS

## 2020-04-13 MED ORDER — LIDOCAINE-PRILOCAINE 2.5-2.5 % EX CREA: TOPICAL_CREAM | CUTANEOUS | 3 refills | Status: DC

## 2020-04-13 MED ORDER — SODIUM CHLORIDE (PF) 0.9 % IJ SOLN
INTRAMUSCULAR | Status: AC
  Filled 2020-04-13: qty 50

## 2020-04-13 MED ORDER — IOHEXOL 300 MG/ML  SOLN
100.0000 mL | Freq: Once | INTRAMUSCULAR | Status: AC | PRN
  Administered 2020-04-13: 100 mL via INTRAVENOUS

## 2020-04-13 NOTE — Progress Notes (Signed)
Pharmacist Chemotherapy Monitoring - Initial Assessment    Anticipated start date: 04/19/2020  Regimen:  . Are orders appropriate based on the patient's diagnosis, regimen, and cycle? Yes . Does the plan date match the patient's scheduled date? Yes . Is the sequencing of drugs appropriate? Yes . Are the premedications appropriate for the patient's regimen? Yes . Prior Authorization for treatment is: Not Started o If applicable, is the correct biosimilar selected based on the patient's insurance? not applicable  Organ Function and Labs: [name] Are dose adjustments needed based on the patient's renal function, hepatic function, or hematologic function? Yes . Are appropriate labs ordered prior to the start of patient's treatment? Yes . Other organ system assessment, if indicated: N/A . The following baseline labs, if indicated, have been ordered: N/A  Dose Assessment: . Are the drug doses appropriate? Yes . Are the following correct: o Drug concentrations Yes o IV fluid compatible with drug Yes o Administration routes Yes o Timing of therapy Yes . If applicable, does the patient have documented access for treatment and/or plans for port-a-cath placement? yes . If applicable, have lifetime cumulative doses been properly documented and assessed? not applicable Lifetime Dose Tracking  No doses have been documented on this patient for the following tracked chemicals: Doxorubicin, Epirubicin, Idarubicin, Daunorubicin, Mitoxantrone, Bleomycin, Oxaliplatin, Carboplatin, Liposomal Doxorubicin  o   Toxicity Monitoring/Prevention: . The patient has the following take home antiemetics prescribed: Prochlorperazine . The patient has the following take home medications prescribed: N/A . Medication allergies and previous infusion related reactions, if applicable, have been reviewed and addressed. Yes . The patient's current medication list has been assessed for drug-drug interactions with their  chemotherapy regimen. no significant drug-drug interactions were identified on review.  Order Review: . Are the treatment plan orders signed? Yes . Is the patient scheduled to see a provider prior to their treatment? Yes  I verify that I have reviewed each item in the above checklist and answered each question accordingly.  Philomena Course 04/13/2020 8:33 AM

## 2020-04-14 ENCOUNTER — Telehealth: Payer: Self-pay | Admitting: Hematology and Oncology

## 2020-04-14 ENCOUNTER — Encounter (HOSPITAL_COMMUNITY): Payer: Self-pay | Admitting: Vascular Surgery

## 2020-04-14 ENCOUNTER — Encounter (HOSPITAL_COMMUNITY): Payer: Self-pay | Admitting: Surgery

## 2020-04-14 ENCOUNTER — Other Ambulatory Visit: Payer: Self-pay

## 2020-04-14 LAB — SARS CORONAVIRUS 2 (TAT 6-24 HRS): SARS Coronavirus 2: NEGATIVE

## 2020-04-14 NOTE — Progress Notes (Signed)
Pt denies SOB and chest pain. Pt stated that she is under the care of Adella Hare, [place], Cardiology (EP) and Dr. Lavera Guise, PCP. Pt denies having a stress test. Pt denies having a chest x ray in the last year. Pt denies recent labs. Nurse faxed Peri-op Prescription for ICD to both Adella Hare, PA and [facility]; awaiting response. Nurse emailed [employer] Rep, Tomi Bamberger and Lindsi, RN, as requested Pt made aware to stop taking  Aspirin (unless otherwise advised by surgeon), vitamins, fish oil and all herbal medications Liver extract, Duodenum, Mega-Zyme, SOD. Do not take any NSAIDs ie: Ibuprofen, Advil, Naproxen (Aleve), Motrin, BC and Goody Powder. Pt made aware to check CBG every 2 hours prior to arrival to hospital on DOS. Pt made aware to treat a CBG < 70 with 4 ounces of apple or cranberry juice, wait 15 minutes after intervention to recheck CBG, if CBG remains < 70, call Short Stay unit to speak with a nurse. Pt reminded to quarantine. Pt verbalized understanding of all pre-op instructions. PA, Anesthesiology, asked to review pt history.

## 2020-04-14 NOTE — Progress Notes (Signed)
Anesthesia Chart Review: Colleen Lowery   Case: [id number] Date/Time: 04/15/20 1115   Procedure: INSERTION PORT-A-CATH (N/A )   Anesthesia type: General   Pre-op diagnosis: POOR VENOUS ACCESS   Location: [facility] OR ROOM 02 / [facility]   Surgeons: Erroll Luna, MD       DISCUSSION: Patient is a 79 year old female scheduled for the above procedure. 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 By notes, she is a retired NP.   Last ICD Interrogation 02/25/20 [facility]):  Patient Name: Colleen Lowery, 1941-06-21, 79 y.o. Following Provider: Mahala Menghini, MD Manufacturer of Device: [employer] Type of Device: Single Chamber Defibrillator See scanned/downloaded PDF report for the model numbers, serial numbers, and date of implant. Presenting Rhythm: VS ~60 Percentage RV / BiV Pacing: <0.1%RV Pacing Device Findings: Lead and Battery OK; 2 NSVT: Long 3 sec at 180 Please see downloaded PDF file of transmission under Media Tab for full details of device interrogation to include, when applicable, battery status/charge time, lead trend data, and programmed parameters. Congestive Heart Failure Surveillance: Indication of possible fluid isse per [employer] Plan: Findings forwarded to office for further review of possible fluid issue.   UPDATE: I spoke with Sharyn Lull at Dr. Josetta Huddle office late on 04/14/20. Patient's cardiologist would not clear her for surgery without a preoperative evaluation which has been  scheduled for 04/16/20. Port-a-cath for 04/15/20 will be cancelled and then rescheduled through general surgery or IR following cardiology visit.   PROVIDERS: [name], MD is listed as PCP - Nicholas Lose, MD is HEM-ONC - Mahala Menghini, MD is cardiologist ([place]). Last evaluation by Adella Hare, PA-C on 12/05/19. Previously she had seen [facility] Daneen Schick, MD and Allegra Lai, MD) - Christinia Gully, MD is pulmonologist. Last visit in 2017 for upper airway cough syndrome. Cough with some improvement after stopping ACE-I.   LABS: For day of procedure. 02/04/20 CBC WNL. Cr 0.90 on 04/13/20. COVID-19 negative 04/13/20.   IMAGES: Bone scan 04/13/20: IMPRESSION: 1.  No findings specific for metastatic disease to bone. 2. 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 3. Benign appearing right anterior rib fractures. Scattered degenerative radiotracer activity in the extremities.  CT Chest/abd/pelvis 04/13/20: IMPRESSION: 1. LEFT axillary lymphadenopathy and skin thickening and nodularity about the LEFT breast is partially visualized.w no current signs of distant disease. 2. Subtle ground-glass nodule at the RIGHT lung base, nonspecific. Suggest attention on subsequent imaging, follow-up in 3-6 months may be helpful. 3. 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 No perivascular stranding. The finding likely chronic in the setting of marked vascular disease correlate with any symptoms. 4. Abdominal aortic dilation to 2.5/2.6 cm. Ectatic abdominal aorta at risk for aneurysm development. Recommend followup by ultrasound in 5 years. This recommendation follows ACR consensus guidelines: White Paper of the ACR Incidental Findings Committee II on Vascular Findings. J Am Coll Radiol 2013; 10:789-794. Aortic aneurysm NOS (ICD10-I71.9) 5. Low-density lesion arising from the upper pole the LEFT kidney likely a  cyst but  with density values slightly greater than 30 Hounsfield units postcontrast. Consider renal sonography or follow-up renal protocol CT for further assessment. 6. Signs of subacute and chronic rib fractures along the RIGHT chest multiple sites. 7. Aortic atherosclerosis. - These results will be called to the ordering clinician or representative by the Radiologist Assistant, and communication documented in the PACS or [employer]. - Aortic Atherosclerosis (ICD10-I70.0). Electronically Signed   By: Zetta Bills M.D.   On: 04/13/2020 08:51    EKG: EKG 02/04/20: Sinus rhythm Incomplete left bundle branch block Probable LVH with secondary repol abnrm Inferior infarct, age indeterminate NO STEMI Confirmed by Octaviano Glow [id number]) on 02/04/2020 3:24:00 PM   CV: Cardiac cath 02/10/16: Severe single vessel CAD: Mid RCA to Dist RCA lesion, 100% stenosed. There is moderate left ventricular systolic dysfunction. Basal to mid inferior hypokinesis/akinesis Moderate to severe secondary pulmonary hypertension with elevated LVEDP and pulmonary Wedge pressure. Large PCWP V wave suggestive of potential ischemic MR Severe systemic hypertension - The patient has ischemic cardiomyopathy with an occluded RCA and very fine collaterals to the distal RCA branch vessels. I suspect that this is a subacute occlusion dating back to her initial onset discomfort a week ago. The occlusion appears to be consolidated and likely difficult to open at this time. Could consider CTO intervention in the future. - Patient has severe systemic hypertension with elevated LVEDP and wedge pressure needs to be treated. She likely also needs additional diuresis.  Echo 02/08/16: Study Conclusions  - Left ventricle: The cavity size was moderately to severely    dilated. There was mild concentric hypertrophy. Systolic function    was mildly to moderately reduced. The estimated ejection fraction    was in the range of  40% to 45%. Mild hypokinesis of the inferior    and inferoseptal myocardium. Features are consistent with a    pseudonormal left ventricular filling pattern, with concomitant    abnormal relaxation and increased filling pressure (grade 2    diastolic dysfunction). Doppler parameters are consistent with    elevated mean left atrial filling pressure.  - Aortic valve: There was no stenosis. There was no regurgitation.  - Mitral valve: Mobility of the anterior and posterior leaflet was    restricted. There was mild regurgitation. Effective regurgitant    orifice (PISA): 0.12 cm^2.  - Left atrium: The atrium was severely dilated.  - Right ventricle: The cavity size was normal. Wall thickness was    normal. Systolic function was normal.  - Pulmonary arteries: Systolic pressure was mildly increased. PA    peak pressure: 44 mm Hg (S).  - Inferior vena cava: The vessel was dilated. The respirophasic    diameter changes were in the normal range (>= 50%), consistent    with elevated central venous pressure.  - Pericardium, extracardiac: There was a left pleural effusion.    Past Medical History:  Diagnosis Date   Allergy    Arthritis    FINGERS   Cancer ([place])    left breast   Cataract    right   Crohn disease ([place])    Depression    Diabetes mellitus without complication ([place])    TYPE 2 , TAKING OTC MED FORGYMEMA   GERD (gastroesophageal reflux disease)    Headache    sinus   Heart failure, acute, systolic and diastolic ([place]) MARCH 2458 DX   History of colonic polyps    Hypertension    IBS (irritable bowel syndrome)    Seizures ([place])  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, 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   Thyroid disease    AGE 17'S UNTIL  2016 OFF ALL THRYOID MEDS NOW    Wears dentures    Wears glasses     Past Surgical History:  Procedure Laterality Date   ABDOMINAL HYSTERECTOMY     PARTIAL   BREAST SURGERY     CARDIAC CATHETERIZATION  02/10/2016   Procedure: Right/Left Heart Cath and Coronary  Angiography;  Surgeon: Leonie Man, MD;  Location: [facility];  Service: Cardiovascular;;   CATARACT EXTRACTION W/ INTRAOCULAR LENS IMPLANT     left   COLONOSCOPY WITH PROPOFOL N/A 02/16/2017   Procedure: COLONOSCOPY WITH PROPOFOL;  Surgeon: Carol Ada, MD;  Location: [facility];  Service: Endoscopy;  Laterality: N/A;   EP IMPLANTABLE DEVICE N/A 02/10/2016   Procedure: ICD Implant;  Surgeon: Will Meredith Leeds, MD;  Location: [place] [facility];  Service: Cardiovascular;  Laterality: N/A;   ESOPHAGOGASTRODUODENOSCOPY (EGD) WITH PROPOFOL N/A 02/16/2017   Procedure: ESOPHAGOGASTRODUODENOSCOPY (EGD) WITH PROPOFOL;  Surgeon: Carol Ada, MD;  Location: [facility];  Service: Endoscopy;  Laterality: N/A;  reflux   TONSILLECTOMY      MEDICATIONS: No current facility-administered medications for this encounter.    Ascorbic Acid (VITAMIN C PO)   b complex vitamins tablet   Beta Carotene (VITAMIN A) 25000 UNIT capsule   CALCIUM PO   Cholecalciferol (VITAMIN D) 10 MCG/ML LIQD   Cholecalciferol (VITAMIN D3) 5000 units CAPS   furosemide (LASIX) 20 MG tablet   Liver Extract (LIVER PO)   losartan (COZAAR) 50 MG tablet   MAGNESIUM PO   Menaquinone-7 (VITAMIN K2 PO)   Misc Natural Products (DUODENUM) TABS   nitroGLYCERIN (NITROSTAT) 0.4 MG SL tablet   olopatadine (PATANOL) 0.1 % ophthalmic solution   ondansetron (ZOFRAN) 8 MG tablet   OVER THE COUNTER MEDICATION   OVER THE COUNTER MEDICATION   OVER THE COUNTER MEDICATION   potassium chloride SA (K-DUR,KLOR-CON) 20 MEQ tablet   prochlorperazine (COMPAZINE) 10 MG tablet   sotalol (BETAPACE) 80 MG tablet   vitamin E 400 UNIT capsule   aspirin 81 MG EC tablet   lidocaine-prilocaine (EMLA) cream   predniSONE (DELTASONE) 20 MG tablet   predniSONE (DELTASONE) 50 MG tablet    Myra Gianotti, PA-C Surgical Short Stay/Anesthesiology [facility] Phone [phone] [facility] Phone [phone] 04/14/2020 6:54 PM'

## 2020-04-14 NOTE — Progress Notes (Signed)
Pt stated that she was told that she had to be cleared by cardiology before she can have surgery. Pt stated that she needs to take Prednisone prior to arrival to the hospital to avoid having a reaction to Purell instant hand sanitizer. Please alert staff to avoid using Purell and wash hands with soap and water only!!!

## 2020-04-14 NOTE — Telephone Encounter (Signed)
Scheduled apt per 5/28 sch message - pt is aware of next two appts.

## 2020-04-15 ENCOUNTER — Telehealth: Payer: Self-pay | Admitting: *Deleted

## 2020-04-15 ENCOUNTER — Emergency Department (HOSPITAL_COMMUNITY)
Admission: EM | Admit: 2020-04-15 | Discharge: 2020-04-15 | Disposition: A | Discharge status: Home / Self Care | Payer: Medicare HMO | Attending: Emergency Medicine | Admitting: Emergency Medicine

## 2020-04-15 ENCOUNTER — Emergency Department (HOSPITAL_COMMUNITY): Payer: Medicare HMO

## 2020-04-15 ENCOUNTER — Other Ambulatory Visit: Payer: Self-pay

## 2020-04-15 ENCOUNTER — Ambulatory Visit (HOSPITAL_COMMUNITY): Admission: RE | Admit: 2020-04-15 | Payer: Medicare HMO | Source: Home / Self Care | Admitting: Surgery

## 2020-04-15 ENCOUNTER — Encounter (HOSPITAL_COMMUNITY): Payer: Self-pay | Admitting: Emergency Medicine

## 2020-04-15 ENCOUNTER — Inpatient Hospital Stay: Payer: Medicare HMO | Attending: Hematology and Oncology

## 2020-04-15 DIAGNOSIS — Z95810 Presence of automatic (implantable) cardiac defibrillator: Secondary | ICD-10-CM | POA: Insufficient documentation

## 2020-04-15 DIAGNOSIS — I5042 Chronic combined systolic (congestive) and diastolic (congestive) heart failure: Secondary | ICD-10-CM | POA: Insufficient documentation

## 2020-04-15 DIAGNOSIS — F17210 Nicotine dependence, cigarettes, uncomplicated: Secondary | ICD-10-CM | POA: Diagnosis not present

## 2020-04-15 DIAGNOSIS — Z79899 Other long term (current) drug therapy: Secondary | ICD-10-CM | POA: Diagnosis not present

## 2020-04-15 DIAGNOSIS — I110 Hypertensive heart disease with heart failure: Secondary | ICD-10-CM | POA: Insufficient documentation

## 2020-04-15 DIAGNOSIS — Z170 Estrogen receptor positive status [ER+]: Secondary | ICD-10-CM | POA: Insufficient documentation

## 2020-04-15 DIAGNOSIS — Z7982 Long term (current) use of aspirin: Secondary | ICD-10-CM | POA: Insufficient documentation

## 2020-04-15 DIAGNOSIS — C50412 Malignant neoplasm of upper-outer quadrant of left female breast: Secondary | ICD-10-CM | POA: Insufficient documentation

## 2020-04-15 DIAGNOSIS — Y92410 Unspecified street and highway as the place of occurrence of the external cause: Secondary | ICD-10-CM | POA: Diagnosis not present

## 2020-04-15 DIAGNOSIS — Y999 Unspecified external cause status: Secondary | ICD-10-CM | POA: Diagnosis not present

## 2020-04-15 DIAGNOSIS — R569 Unspecified convulsions: Secondary | ICD-10-CM | POA: Diagnosis present

## 2020-04-15 DIAGNOSIS — Z5111 Encounter for antineoplastic chemotherapy: Secondary | ICD-10-CM | POA: Insufficient documentation

## 2020-04-15 DIAGNOSIS — Y9389 Activity, other specified: Secondary | ICD-10-CM | POA: Diagnosis not present

## 2020-04-15 DIAGNOSIS — E119 Type 2 diabetes mellitus without complications: Secondary | ICD-10-CM | POA: Diagnosis not present

## 2020-04-15 HISTORY — DX: Malignant (primary) neoplasm, unspecified: C80.1

## 2020-04-15 HISTORY — DX: Irritable bowel syndrome, unspecified: K58.9

## 2020-04-15 HISTORY — DX: Presence of dental prosthetic device (complete) (partial): Z97.2

## 2020-04-15 HISTORY — DX: Personal history of colon polyps, unspecified: Z86.0100

## 2020-04-15 HISTORY — DX: Crohn's disease, unspecified, without complications: K50.90

## 2020-04-15 HISTORY — DX: Personal history of colonic polyps: Z86.010

## 2020-04-15 HISTORY — DX: Unspecified cataract: H26.9

## 2020-04-15 HISTORY — DX: Presence of spectacles and contact lenses: Z97.3

## 2020-04-15 HISTORY — DX: Headache, unspecified: R51.9

## 2020-04-15 LAB — COMPREHENSIVE METABOLIC PANEL
ALT: 17 U/L (ref 0–44)
AST: 21 U/L (ref 15–41)
Albumin: 3.7 g/dL (ref 3.5–5.0)
Alkaline Phosphatase: 83 U/L (ref 38–126)
Anion gap: 8 (ref 5–15)
BUN: 13 mg/dL (ref 8–23)
CO2: 23 mmol/L (ref 22–32)
Calcium: 8.8 mg/dL — ABNORMAL LOW (ref 8.9–10.3)
Chloride: 105 mmol/L (ref 98–111)
Creatinine, Ser: 0.77 mg/dL (ref 0.44–1.00)
GFR calc Af Amer: >60 mL/min (ref >60)
GFR calc non Af Amer: >60 mL/min (ref >60)
Glucose, Bld: 173 mg/dL — ABNORMAL HIGH (ref 70–99)
Potassium: 4.1 mmol/L (ref 3.5–5.1)
Sodium: 136 mmol/L (ref 135–145)
Total Bilirubin: 0.5 mg/dL (ref 0.3–1.2)
Total Protein: 6.3 g/dL — ABNORMAL LOW (ref 6.5–8.1)

## 2020-04-15 LAB — CBC WITH DIFFERENTIAL/PLATELET
Abs Immature Granulocytes: 0.09 10*3/uL — ABNORMAL HIGH (ref 0.00–0.07)
Basophils Absolute: 0 10*3/uL (ref 0.0–0.1)
Basophils Relative: 0 %
Eosinophils Absolute: 0.1 10*3/uL (ref 0.0–0.5)
Eosinophils Relative: 1 %
HCT: 41.8 % (ref 36.0–46.0)
Hemoglobin: 13.9 g/dL (ref 12.0–15.0)
Immature Granulocytes: 1 %
Lymphocytes Relative: 30 %
Lymphs Abs: 2.8 10*3/uL (ref 0.7–4.0)
MCH: 30.8 pg (ref 26.0–34.0)
MCHC: 33.3 g/dL (ref 30.0–36.0)
MCV: 92.7 fL (ref 80.0–100.0)
Monocytes Absolute: 1 10*3/uL (ref 0.1–1.0)
Monocytes Relative: 11 %
Neutro Abs: 5.3 10*3/uL (ref 1.7–7.7)
Neutrophils Relative %: 57 %
Platelets: 228 10*3/uL (ref 150–400)
RBC: 4.51 MIL/uL (ref 3.87–5.11)
RDW: 13.7 % (ref 11.5–15.5)
WBC: 9.4 10*3/uL (ref 4.0–10.5)
nRBC: 0 % (ref 0.0–0.2)

## 2020-04-15 LAB — URINALYSIS, ROUTINE W REFLEX MICROSCOPIC
Bacteria, UA: NONE SEEN
Bilirubin Urine: NEGATIVE
Glucose, UA: NEGATIVE mg/dL
Ketones, ur: NEGATIVE mg/dL
Leukocytes,Ua: NEGATIVE
Nitrite: NEGATIVE
Protein, ur: NEGATIVE mg/dL
Specific Gravity, Urine: 1.002 — ABNORMAL LOW (ref 1.005–1.030)
pH: 6 (ref 5.0–8.0)

## 2020-04-15 LAB — PROTIME-INR
INR: 1 (ref 0.8–1.2)
Prothrombin Time: 12.8 seconds (ref 11.4–15.2)

## 2020-04-15 LAB — CBG MONITORING, ED: Glucose-Capillary: 165 mg/dL — ABNORMAL HIGH (ref 70–99)

## 2020-04-15 LAB — TROPONIN I (HIGH SENSITIVITY): Troponin I (High Sensitivity): 7 ng/L (ref <18)

## 2020-04-15 SURGERY — INSERTION, TUNNELED CENTRAL VENOUS DEVICE, WITH PORT
Anesthesia: General

## 2020-04-15 MED ORDER — SODIUM CHLORIDE 0.9 % IV SOLN
INTRAVENOUS | Status: DC
  Administered 2020-04-15: 125 mL/h via INTRAVENOUS

## 2020-04-15 MED ORDER — SODIUM CHLORIDE 0.9 % IV BOLUS
1000.0000 mL | Freq: Once | INTRAVENOUS | Status: AC
  Administered 2020-04-15: 1000 mL via INTRAVENOUS
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
## 2020-04-15 NOTE — ED Notes (Signed)
[employer] help team notified due to device not registering pacemaker. [employer] advised that they would call back after researching issue.
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
## 2020-04-16 ENCOUNTER — Telehealth: Payer: Self-pay | Admitting: *Deleted

## 2020-04-16 ENCOUNTER — Other Ambulatory Visit: Payer: Medicare HMO

## 2020-04-16 ENCOUNTER — Inpatient Hospital Stay: Payer: Medicare HMO

## 2020-04-16 DIAGNOSIS — Z720 Tobacco use: Secondary | ICD-10-CM | POA: Insufficient documentation

## 2020-04-16 NOTE — Telephone Encounter (Signed)
Spoke to pt concerning cardiology appt today. Relate she will be able to make her appt at 1:30. Pt knows she will need to call back and inform if she has received clearance to start chemo on Monday.

## 2020-04-16 NOTE — Telephone Encounter (Signed)
Received call from pt stating cardiology has cleared her to start chemo on Monday. Left vm for pt stating I received her msg and gave details about appt date and time on 6/7. Contact information provided for questions or needs.
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
## 2020-04-19 ENCOUNTER — Encounter: Payer: Self-pay | Admitting: *Deleted

## 2020-04-19 ENCOUNTER — Inpatient Hospital Stay: Payer: Medicare HMO

## 2020-04-19 ENCOUNTER — Encounter: Payer: Self-pay | Admitting: Licensed Clinical Social Worker

## 2020-04-19 ENCOUNTER — Inpatient Hospital Stay (HOSPITAL_BASED_OUTPATIENT_CLINIC_OR_DEPARTMENT_OTHER): Payer: Medicare HMO | Admitting: Hematology and Oncology

## 2020-04-19 ENCOUNTER — Other Ambulatory Visit: Payer: Self-pay | Admitting: *Deleted

## 2020-04-19 ENCOUNTER — Other Ambulatory Visit: Payer: Self-pay

## 2020-04-19 DIAGNOSIS — C50412 Malignant neoplasm of upper-outer quadrant of left female breast: Secondary | ICD-10-CM | POA: Diagnosis present

## 2020-04-19 DIAGNOSIS — Z170 Estrogen receptor positive status [ER+]: Secondary | ICD-10-CM

## 2020-04-19 DIAGNOSIS — Z79899 Other long term (current) drug therapy: Secondary | ICD-10-CM | POA: Diagnosis not present

## 2020-04-19 DIAGNOSIS — Z5111 Encounter for antineoplastic chemotherapy: Secondary | ICD-10-CM | POA: Diagnosis present

## 2020-04-19 LAB — CBC WITH DIFFERENTIAL (CANCER CENTER ONLY)
Abs Immature Granulocytes: 0.08 10*3/uL — ABNORMAL HIGH (ref 0.00–0.07)
Basophils Absolute: 0 10*3/uL (ref 0.0–0.1)
Basophils Relative: 0 %
Eosinophils Absolute: 0.1 10*3/uL (ref 0.0–0.5)
Eosinophils Relative: 2 %
HCT: 39.7 % (ref 36.0–46.0)
Hemoglobin: 13.1 g/dL (ref 12.0–15.0)
Immature Granulocytes: 1 %
Lymphocytes Relative: 30 %
Lymphs Abs: 2 10*3/uL (ref 0.7–4.0)
MCH: 30.6 pg (ref 26.0–34.0)
MCHC: 33 g/dL (ref 30.0–36.0)
MCV: 92.8 fL (ref 80.0–100.0)
Monocytes Absolute: 0.8 10*3/uL (ref 0.1–1.0)
Monocytes Relative: 12 %
Neutro Abs: 3.7 10*3/uL (ref 1.7–7.7)
Neutrophils Relative %: 55 %
Platelet Count: 230 10*3/uL (ref 150–400)
RBC: 4.28 MIL/uL (ref 3.87–5.11)
RDW: 13.6 % (ref 11.5–15.5)
WBC Count: 6.8 10*3/uL (ref 4.0–10.5)
nRBC: 0 % (ref 0.0–0.2)

## 2020-04-19 LAB — CMP (CANCER CENTER ONLY)
ALT: 12 U/L (ref 0–44)
AST: 15 U/L (ref 15–41)
Albumin: 3.4 g/dL — ABNORMAL LOW (ref 3.5–5.0)
Alkaline Phosphatase: 91 U/L (ref 38–126)
Anion gap: 10 (ref 5–15)
BUN: 10 mg/dL (ref 8–23)
CO2: 21 mmol/L — ABNORMAL LOW (ref 22–32)
Calcium: 9.5 mg/dL (ref 8.9–10.3)
Chloride: 103 mmol/L (ref 98–111)
Creatinine: 0.99 mg/dL (ref 0.44–1.00)
GFR, Est AFR Am: >60 mL/min (ref >60)
GFR, Estimated: 55 mL/min — ABNORMAL LOW (ref >60)
Glucose, Bld: 261 mg/dL — ABNORMAL HIGH (ref 70–99)
Potassium: 3.6 mmol/L (ref 3.5–5.1)
Sodium: 134 mmol/L — ABNORMAL LOW (ref 135–145)
Total Bilirubin: 0.6 mg/dL (ref 0.3–1.2)
Total Protein: 6.5 g/dL (ref 6.5–8.1)

## 2020-04-19 MED ORDER — PALONOSETRON HCL INJECTION 0.25 MG/5ML
0.2500 mg | Freq: Once | INTRAVENOUS | Status: AC
  Administered 2020-04-19: 0.25 mg via INTRAVENOUS

## 2020-04-19 MED ORDER — METHOTREXATE SODIUM (PF) CHEMO INJECTION 250 MG/10ML
29.8000 mg/m2 | Freq: Once | INTRAMUSCULAR | Status: AC
  Administered 2020-04-19: 50 mg via INTRAVENOUS
  Filled 2020-04-19: qty 2

## 2020-04-19 MED ORDER — PALONOSETRON HCL INJECTION 0.25 MG/5ML
INTRAVENOUS | Status: AC
  Filled 2020-04-19: qty 5

## 2020-04-19 MED ORDER — PROCHLORPERAZINE MALEATE 10 MG PO TABS: 10.0000 mg | ORAL_TABLET | Freq: Four times a day (QID) | ORAL | 1 refills | Status: DC | PRN

## 2020-04-19 MED ORDER — FLUOROURACIL CHEMO INJECTION 2.5 GM/50ML
500.0000 mg/m2 | Freq: Once | INTRAVENOUS | Status: AC
  Administered 2020-04-19: 850 mg via INTRAVENOUS
  Filled 2020-04-19: qty 17

## 2020-04-19 MED ORDER — SODIUM CHLORIDE 0.9 % IV SOLN
10.0000 mg | Freq: Once | INTRAVENOUS | Status: AC
  Administered 2020-04-19: 10 mg via INTRAVENOUS
  Filled 2020-04-19: qty 10

## 2020-04-19 MED ORDER — SODIUM CHLORIDE 0.9 % IV SOLN
500.0000 mg/m2 | Freq: Once | INTRAVENOUS | Status: AC
  Administered 2020-04-19: 840 mg via INTRAVENOUS
  Filled 2020-04-19: qty 42

## 2020-04-19 MED ORDER — ONDANSETRON HCL 8 MG PO TABS: 8.0000 mg | ORAL_TABLET | Freq: Two times a day (BID) | ORAL | 1 refills | Status: DC | PRN

## 2020-04-19 MED ORDER — SODIUM CHLORIDE 0.9 % IV SOLN
Freq: Once | INTRAVENOUS | Status: AC
  Filled 2020-04-19: qty 250
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
## 2020-04-19 NOTE — Patient Instructions (Signed)
[facility] Discharge Instructions for Patients Receiving Chemotherapy  Today you received the following chemotherapy agents Cytoxan, Methotrexate, 5FU.  To help prevent nausea and vomiting after your treatment, we encourage you to take your nausea medication DO NOT TAKE ZOFRAN FOR THREE DAYS AFTER TREATMENT.   If you develop nausea and vomiting that is not controlled by your nausea medication, call the clinic.   BELOW ARE SYMPTOMS THAT SHOULD BE REPORTED IMMEDIATELY:  *FEVER GREATER THAN 100.5 F  *CHILLS WITH OR WITHOUT FEVER  NAUSEA AND VOMITING THAT IS NOT CONTROLLED WITH YOUR NAUSEA MEDICATION  *UNUSUAL SHORTNESS OF BREATH  *UNUSUAL BRUISING OR BLEEDING  TENDERNESS IN MOUTH AND THROAT WITH OR WITHOUT PRESENCE OF ULCERS  *URINARY PROBLEMS  *BOWEL PROBLEMS  UNUSUAL RASH Items with * indicate a potential emergency and should be followed up as soon as possible.  Feel free to call the clinic should you have any questions or concerns. The clinic phone number is [phone].  Please show the [facility] at check-in to the Emergency Department and triage nurse.  Fluorouracil, 5-FU injection What is this medicine? FLUOROURACIL, 5-FU (flure oh YOOR a sil) is a chemotherapy drug. It slows the growth of cancer cells. This medicine is used to treat many types of cancer like breast cancer, colon or rectal cancer, pancreatic cancer, and stomach cancer. This medicine may be used for other purposes; ask your health care provider or pharmacist if you have questions. COMMON BRAND NAME(S): Adrucil What should I tell my health care provider before I take this medicine? They need to know if you have any of these conditions:  blood disorders  dihydropyrimidine dehydrogenase (DPD) deficiency  infection (especially a virus infection such as chickenpox, cold sores, or herpes)  kidney disease  liver disease  malnourished, poor nutrition  recent or ongoing  radiation therapy  an unusual or allergic reaction to fluorouracil, other chemotherapy, other medicines, foods, dyes, or preservatives  pregnant or trying to get pregnant  breast-feeding How should I use this medicine? This drug is given as an infusion or injection into a vein. It is administered in a hospital or clinic by a specially trained health care professional. Talk to your pediatrician regarding the use of this medicine in children. Special care may be needed. Overdosage: If you think you have taken too much of this medicine contact a poison control center or emergency room at once. NOTE: This medicine is only for you. Do not share this medicine with others. What if I miss a dose? It is important not to miss your dose. Call your doctor or health care professional if you are unable to keep an appointment. What may interact with this medicine?  allopurinol  cimetidine  dapsone  digoxin  hydroxyurea  leucovorin  levamisole  medicines for seizures like ethotoin, fosphenytoin, phenytoin  medicines to increase blood counts like filgrastim, pegfilgrastim, sargramostim  medicines that treat or prevent blood clots like warfarin, enoxaparin, and dalteparin  methotrexate  metronidazole  pyrimethamine  some other chemotherapy drugs like busulfan, cisplatin, estramustine, vinblastine  trimethoprim  trimetrexate  vaccines Talk to your doctor or health care professional before taking any of these medicines:  acetaminophen  aspirin  ibuprofen  ketoprofen  naproxen This list may not describe all possible interactions. Give your health care provider a list of all the medicines, herbs, non-prescription drugs, or dietary supplements you use. Also tell them if you smoke, drink alcohol, or use illegal drugs. Some items may interact with your  medicine. What should I watch for while using this medicine? Visit your doctor for checks on your progress. This drug may make  you feel generally unwell. This is not uncommon, as chemotherapy can affect healthy cells as well as cancer cells. Report any side effects. Continue your course of treatment even though you feel ill unless your doctor tells you to stop. In some cases, you may be given additional medicines to help with side effects. Follow all directions for their use. Call your doctor or health care professional for advice if you get a fever, chills or sore throat, or other symptoms of a cold or flu. Do not treat yourself. This drug decreases your body's ability to fight infections. Try to avoid being around people who are sick. This medicine may increase your risk to bruise or bleed. Call your doctor or health care professional if you notice any unusual bleeding. Be careful brushing and flossing your teeth or using a toothpick because you may get an infection or bleed more easily. If you have any dental work done, tell your dentist you are receiving this medicine. Avoid taking products that contain aspirin, acetaminophen, ibuprofen, naproxen, or ketoprofen unless instructed by your doctor. These medicines may hide a fever. Do not become pregnant while taking this medicine. Women should inform their doctor if they wish to become pregnant or think they might be pregnant. There is a potential for serious side effects to an unborn child. Talk to your health care professional or pharmacist for more information. Do not breast-feed an infant while taking this medicine. Men should inform their doctor if they wish to father a child. This medicine may lower sperm counts. Do not treat diarrhea with over the counter products. Contact your doctor if you have diarrhea that lasts more than 2 days or if it is severe and watery. This medicine can make you more sensitive to the sun. Keep out of the sun. If you cannot avoid being in the sun, wear protective clothing and use sunscreen. Do not use sun lamps or tanning beds/booths. What side  effects may I notice from receiving this medicine? Side effects that you should report to your doctor or health care professional as soon as possible:  allergic reactions like skin rash, itching or hives, swelling of the face, lips, or tongue  low blood counts - this medicine may decrease the number of white blood cells, red blood cells and platelets. You may be at increased risk for infections and bleeding.  signs of infection - fever or chills, cough, sore throat, pain or difficulty passing urine  signs of decreased platelets or bleeding - bruising, pinpoint red spots on the skin, black, tarry stools, blood in the urine  signs of decreased red blood cells - unusually weak or tired, fainting spells, lightheadedness  breathing problems  changes in vision  chest pain  mouth sores  nausea and vomiting  pain, swelling, redness at site where injected  pain, tingling, numbness in the hands or feet  redness, swelling, or sores on hands or feet  stomach pain  unusual bleeding Side effects that usually do not require medical attention (report to your doctor or health care professional if they continue or are bothersome):  changes in finger or toe nails  diarrhea  dry or itchy skin  hair loss  headache  loss of appetite  sensitivity of eyes to the light  stomach upset  unusually teary eyes This list may not describe all possible side effects. Call your  doctor for medical advice about side effects. You may report side effects to FDA at 1-800-FDA-1088. Where should I keep my medicine? This drug is given in a hospital or clinic and will not be stored at home. NOTE: This sheet is a summary. It may not cover all possible information. If you have questions about this medicine, talk to your doctor, pharmacist, or health care provider.  2020 Elsevier/Gold Standard (2008-03-04 13:53:16) Methotrexate injection What is this medicine? METHOTREXATE (METH oh TREX ate) is a  chemotherapy drug used to treat cancer including breast cancer, leukemia, and lymphoma. This medicine can also be used to treat psoriasis and certain kinds of arthritis. This medicine may be used for other purposes; ask your health care provider or pharmacist if you have questions. What should I tell my health care provider before I take this medicine? They need to know if you have any of these conditions:  fluid in the stomach area or lungs  if you often drink alcohol  infection or immune system problems  kidney disease  liver disease  low blood counts, like low white cell, platelet, or red cell counts  lung disease  radiation therapy  stomach ulcers  ulcerative colitis  an unusual or allergic reaction to methotrexate, other medicines, foods, dyes, or preservatives  pregnant or trying to get pregnant  breast-feeding How should I use this medicine? This medicine is for infusion into a vein or for injection into muscle or into the spinal fluid (whichever applies). It is usually given by a health care professional in a hospital or clinic setting. In rare cases, you might get this medicine at home. You will be taught how to give this medicine. Use exactly as directed. Take your medicine at regular intervals. Do not take your medicine more often than directed. If this medicine is used for arthritis or psoriasis, it should be taken weekly, NOT daily. It is important that you put your used needles and syringes in a special sharps container. Do not put them in a trash can. If you do not have a sharps container, call your pharmacist or healthcare provider to get one. Talk to your pediatrician regarding the use of this medicine in children. While this drug may be prescribed for children as young as 2 years for selected conditions, precautions do apply. Overdosage: If you think you have taken too much of this medicine contact a poison control center or emergency room at once. NOTE: This  medicine is only for you. Do not share this medicine with others. What if I miss a dose? It is important not to miss your dose. Call your doctor or health care professional if you are unable to keep an appointment. If you give yourself the medicine and you miss a dose, talk with your doctor or health care professional. Do not take double or extra doses. What may interact with this medicine? This medicine may interact with the following medications:  acitretin  aspirin or aspirin-like medicines including salicylates  azathioprine  certain antibiotics like chloramphenicol, penicillin, tetracycline  certain medicines for stomach problems like esomeprazole, omeprazole, pantoprazole  cyclosporine  gold  hydroxychloroquine  live virus vaccines  mercaptopurine  NSAIDs, medicines for pain and inflammation, like ibuprofen or naproxen  other cytotoxic agents  penicillamine  phenylbutazone  phenytoin  probenacid  retinoids such as isotretinoin and tretinoin  steroid medicines like prednisone or cortisone  sulfonamides like sulfasalazine and trimethoprim/sulfamethoxazole  theophylline This list may not describe all possible interactions. Give your health care  provider a list of all the medicines, herbs, non-prescription drugs, or dietary supplements you use. Also tell them if you smoke, drink alcohol, or use illegal drugs. Some items may interact with your medicine. What should I watch for while using this medicine? Avoid alcoholic drinks. In some cases, you may be given additional medicines to help with side effects. Follow all directions for their use. This medicine can make you more sensitive to the sun. Keep out of the sun. If you cannot avoid being in the sun, wear protective clothing and use sunscreen. Do not use sun lamps or tanning beds/booths. You may get drowsy or dizzy. Do not drive, use machinery, or do anything that needs mental alertness until you know how this  medicine affects you. Do not stand or sit up quickly, especially if you are an older patient. This reduces the risk of dizzy or fainting spells. You may need blood work done while you are taking this medicine. Call your doctor or health care professional for advice if you get a fever, chills or sore throat, or other symptoms of a cold or flu. Do not treat yourself. This drug decreases your body's ability to fight infections. Try to avoid being around people who are sick. This medicine may increase your risk to bruise or bleed. Call your doctor or health care professional if you notice any unusual bleeding. Check with your doctor or health care professional if you get an attack of severe diarrhea, nausea and vomiting, or if you sweat a lot. The loss of too much body fluid can make it dangerous for you to take this medicine. Talk to your doctor about your risk of cancer. You may be more at risk for certain types of cancers if you take this medicine. Both men and women must use effective birth control with this medicine. Do not become pregnant while taking this medicine or until at least 1 normal menstrual cycle has occurred after stopping it. Women should inform their doctor if they wish to become pregnant or think they might be pregnant. Men should not father a child while taking this medicine and for 3 months after stopping it. There is a potential for serious side effects to an unborn child. Talk to your health care professional or pharmacist for more information. Do not breast-feed an infant while taking this medicine. What side effects may I notice from receiving this medicine? Side effects that you should report to your doctor or health care professional as soon as possible:  allergic reactions like skin rash, itching or hives, swelling of the face, lips, or tongue  back pain  breathing problems or shortness of breath  confusion  diarrhea  dry, nonproductive cough  low blood counts - this  medicine may decrease the number of white blood cells, red blood cells and platelets. You may be at increased risk of infections and bleeding  mouth sores  redness, blistering, peeling or loosening of the skin, including inside the mouth  seizures  severe headaches  signs of infection - fever or chills, cough, sore throat, pain or difficulty passing urine  signs and symptoms of bleeding such as bloody or black, tarry stools; red or dark-brown urine; spitting up blood or brown material that looks like coffee grounds; red spots on the skin; unusual bruising or bleeding from the eye, gums, or nose  signs and symptoms of kidney injury like trouble passing urine or change in the amount of urine  signs and symptoms of liver injury like  dark yellow or brown urine; general ill feeling or flu-like symptoms; light-colored stools; loss of appetite; nausea; right upper belly pain; unusually weak or tired; yellowing of the eyes or skin  stiff neck  vomiting Side effects that usually do not require medical attention (report to your doctor or health care professional if they continue or are bothersome):  dizziness  hair loss  headache  stomach pain  upset stomach This list may not describe all possible side effects. Call your doctor for medical advice about side effects. You may report side effects to FDA at 1-800-FDA-1088. Where should I keep my medicine? If you are using this medicine at home, you will be instructed on how to store this medicine. Throw away any unused medicine after the expiration date on the label. NOTE: This sheet is a summary. It may not cover all possible information. If you have questions about this medicine, talk to your doctor, pharmacist, or health care provider.  2020 Elsevier/Gold Standard (2017-06-21 13:31:42) Cyclophosphamide Injection What is this medicine? CYCLOPHOSPHAMIDE (sye kloe FOSS fa mide) is a chemotherapy drug. It slows the growth of cancer cells.  This medicine is used to treat many types of cancer like lymphoma, myeloma, leukemia, breast cancer, and ovarian cancer, to name a few. This medicine may be used for other purposes; ask your health care provider or pharmacist if you have questions. COMMON BRAND NAME(S): Cytoxan, Neosar What should I tell my health care provider before I take this medicine? They need to know if you have any of these conditions:  heart disease  history of irregular heartbeat  infection  kidney disease  liver disease  low blood counts, like white cells, platelets, or red blood cells  on hemodialysis  recent or ongoing radiation therapy  scarring or thickening of the lungs  trouble passing urine  an unusual or allergic reaction to cyclophosphamide, other medicines, foods, dyes, or preservatives  pregnant or trying to get pregnant  breast-feeding How should I use this medicine? This drug is usually given as an injection into a vein or muscle or by infusion into a vein. It is administered in a hospital or clinic by a specially trained health care professional. Talk to your pediatrician regarding the use of this medicine in children. Special care may be needed. Overdosage: If you think you have taken too much of this medicine contact a poison control center or emergency room at once. NOTE: This medicine is only for you. Do not share this medicine with others. What if I miss a dose? It is important not to miss your dose. Call your doctor or health care professional if you are unable to keep an appointment. What may interact with this medicine?  amphotericin B  azathioprine  certain antivirals for HIV or hepatitis  certain medicines for blood pressure, heart disease, irregular heart beat  certain medicines that treat or prevent blood clots like warfarin  certain other medicines for cancer  cyclosporine  etanercept  indomethacin  medicines that relax muscles for surgery  medicines to  increase blood counts  metronidazole This list may not describe all possible interactions. Give your health care provider a list of all the medicines, herbs, non-prescription drugs, or dietary supplements you use. Also tell them if you smoke, drink alcohol, or use illegal drugs. Some items may interact with your medicine. What should I watch for while using this medicine? Your condition will be monitored carefully while you are receiving this medicine. You may need blood work done while  you are taking this medicine. Drink water or other fluids as directed. Urinate often, even at night. Some products may contain alcohol. Ask your health care professional if this medicine contains alcohol. Be sure to tell all health care professionals you are taking this medicine. Certain medicines, like metronidazole and disulfiram, can cause an unpleasant reaction when taken with alcohol. The reaction includes flushing, headache, nausea, vomiting, sweating, and increased thirst. The reaction can last from 30 minutes to several hours. Do not become pregnant while taking this medicine or for 1 year after stopping it. Women should inform their health care professional if they wish to become pregnant or think they might be pregnant. Men should not father a child while taking this medicine and for 4 months after stopping it. There is potential for serious side effects to an unborn child. Talk to your health care professional for more information. Do not breast-feed an infant while taking this medicine or for 1 week after stopping it. This medicine has caused ovarian failure in some women. This medicine may make it more difficult to get pregnant. Talk to your health care professional if you are concerned about your fertility. This medicine has caused decreased sperm counts in some men. This may make it more difficult to father a child. Talk to your health care professional if you are concerned about your fertility. Call your  health care professional for advice if you get a fever, chills, or sore throat, or other symptoms of a cold or flu. Do not treat yourself. This medicine decreases your body's ability to fight infections. Try to avoid being around people who are sick. Avoid taking medicines that contain aspirin, acetaminophen, ibuprofen, naproxen, or ketoprofen unless instructed by your health care professional. These medicines may hide a fever. Talk to your health care professional about your risk of cancer. You may be more at risk for certain types of cancer if you take this medicine. If you are going to need surgery or other procedure, tell your health care professional that you are using this medicine. Be careful brushing or flossing your teeth or using a toothpick because you may get an infection or bleed more easily. If you have any dental work done, tell your dentist you are receiving this medicine. What side effects may I notice from receiving this medicine? Side effects that you should report to your doctor or health care professional as soon as possible:  allergic reactions like skin rash, itching or hives, swelling of the face, lips, or tongue  breathing problems  nausea, vomiting  signs and symptoms of bleeding such as bloody or black, tarry stools; red or dark brown urine; spitting up blood or brown material that looks like coffee grounds; red spots on the skin; unusual bruising or bleeding from the eyes, gums, or nose  signs and symptoms of heart failure like fast, irregular heartbeat, sudden weight gain; swelling of the ankles, feet, hands  signs and symptoms of infection like fever; chills; cough; sore throat; pain or trouble passing urine  signs and symptoms of kidney injury like trouble passing urine or change in the amount of urine  signs and symptoms of liver injury like dark yellow or brown urine; general ill feeling or flu-like symptoms; light-colored stools; loss of appetite; nausea; right  upper belly pain; unusually weak or tired; yellowing of the eyes or skin Side effects that usually do not require medical attention (report to your doctor or health care professional if they continue or are bothersome):  confusion  decreased hearing  diarrhea  facial flushing  hair loss  headache  loss of appetite  missed menstrual periods  signs and symptoms of low red blood cells or anemia such as unusually weak or tired; feeling faint or lightheaded; falls  skin discoloration This list may not describe all possible side effects. Call your doctor for medical advice about side effects. You may report side effects to FDA at 1-800-FDA-1088. Where should I keep my medicine? This drug is given in a hospital or clinic and will not be stored at home. NOTE: This sheet is a summary. It may not cover all possible information. If you have questions about this medicine, talk to your doctor, pharmacist, or health care provider.  2020 Elsevier/Gold Standard (2019-08-04 09:53:29)

## 2020-04-19 NOTE — Assessment & Plan Note (Signed)
03/22/2020:diffuse left breast swelling. skin thickening and two areas of irregular hypoechogenicity in the 2-3 o'clock region, 4.9cm, and at least 4 abnormal lymph nodes  with cortical thickening. 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  Subacute and chronic rib fractures right chest.  No evidence of metastatic disease.  Treatment plan: 1.  Neoadjuvant chemotherapy with CMF 2.  Mastectomy with axillary lymph node dissection 3.  Adjuvant radiation 4.  Followed by adjuvant antiestrogen therapy ------------------------------------------------------------------------------------------------------------------------------------------- Current treatment: Cycle 1 day 1 CMF Chemo education completed, chemo consent obtained Lab reviewed Return to clinic in 1 week for toxicity check

## 2020-04-19 NOTE — Progress Notes (Signed)
[name]  Clinical Social Work was referred by Investment banker, corporate Dr. Lindi Adie for assessment of psychosocial needs (transportation needs).  Clinical Social Worker attempted to contact patient via phone  to offer support and assess for needs as CSW unable to see patient in-person today. LVM with direct call back information. Will also check-in with patient during appointment next week.      Colleen Lowery, [place], [place] Worker [facility]

## 2020-04-19 NOTE — Progress Notes (Signed)
First time chemo went well without complaints. Nutrition offered. Education given. Questions answered.

## 2020-04-20 ENCOUNTER — Ambulatory Visit
Admission: RE | Admit: 2020-04-20 | Discharge: 2020-04-20 | Disposition: A | Discharge status: Home / Self Care | Payer: Medicare HMO | Source: Ambulatory Visit | Attending: Radiation Oncology | Admitting: Radiation Oncology

## 2020-04-20 ENCOUNTER — Encounter: Payer: Self-pay | Admitting: Hematology and Oncology

## 2020-04-20 ENCOUNTER — Other Ambulatory Visit (HOSPITAL_COMMUNITY): Payer: Self-pay | Admitting: Surgery

## 2020-04-20 ENCOUNTER — Encounter: Payer: Self-pay | Admitting: Radiation Oncology

## 2020-04-20 VITALS — Ht 62.0 in | Wt 139.0 lb

## 2020-04-20 DIAGNOSIS — Z170 Estrogen receptor positive status [ER+]: Secondary | ICD-10-CM

## 2020-04-20 DIAGNOSIS — C50412 Malignant neoplasm of upper-outer quadrant of left female breast: Secondary | ICD-10-CM

## 2020-04-20 DIAGNOSIS — C50912 Malignant neoplasm of unspecified site of left female breast: Secondary | ICD-10-CM

## 2020-04-20 NOTE — Progress Notes (Signed)
Called patient whom had financial concerns to introduce myself as Arboriculturist and to offer available resources.  Discussed one-time $1000 Radio broadcast assistant to assist with personal expenses while going through treatment. Advised what to bring on 04/26/20.  Gave her my name and contact number for any additional financial questions or concerns.

## 2020-04-22 NOTE — Progress Notes (Signed)
[place]   [phone] [phone]   Clinic Follow up Note   Patient Care Team: [name], MD as PCP - General (Family Medicine) [name], RN as Oncology Nurse Navigator [name], RN as Oncology Nurse Navigator 04/26/2020  CHIEF COMPLAINT: F/u nadir check, left breast cancer ER+/PR+/HER2-  CURRENT THERAPY: neoadjuvant CMF s/p cycle 1 on 04/19/20   INTERVAL HISTORY: Colleen Lowery returns for f/u as scheduled. She received cycle 1 CMF on 04/19/20. She feels well in general. She is day 8 s/p cycle 1 CMF. On days 5-7 she had mild fatigue but remained out of bed and functional. She has high BP and holding onto abdominal fluid which she attributes to stopping certain supplements. She has adequate appetite. Has occasional tingling in her fingers that predated chemo. Denies mucositis, n/v/c/d, fever, chills, cough, chest pain, dyspnea. Denies new pain. Denies changes in her breasts.    MEDICAL HISTORY:  Past Medical History:  Diagnosis Date  . Allergy   . Arthritis    FINGERS  . Cancer [facility])    left breast  . Cataract    right  . Crohn disease ([place])   . Depression   . Diabetes mellitus without complication ([place])    TYPE 2 , TAKING OTC MED FORGYMEMA  . GERD (gastroesophageal reflux disease)   . Headache    sinus  . Heart failure, acute, systolic and diastolic ([place]) MARCH [id number] DX  . History of colonic polyps   . Hypertension   . IBS (irritable bowel syndrome)   . 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 Thyroid disease    AGE 38'S UNTIL  2016 OFF ALL THRYOID MEDS NOW   . Wears dentures   . Wears glasses     SURGICAL HISTORY: Past Surgical History:  Procedure Laterality Date  . ABDOMINAL HYSTERECTOMY     PARTIAL  . BREAST SURGERY    . CARDIAC CATHETERIZATION  02/10/2016   Procedure: Right/Left Heart Cath and Coronary Angiography;  Surgeon: Leonie Man, MD;  Location: [place] [facility];  Service:  Cardiovascular;;  . CATARACT EXTRACTION W/ INTRAOCULAR LENS IMPLANT     left  . COLONOSCOPY WITH PROPOFOL N/A 02/16/2017   Procedure: COLONOSCOPY WITH PROPOFOL;  Surgeon: Carol Ada, MD;  Location: [facility];  Service: Endoscopy;  Laterality: N/A;  . EP IMPLANTABLE DEVICE N/A 02/10/2016   Procedure: ICD Implant;  Surgeon: Will Meredith Leeds, MD;  Location: [place] [facility];  Service: Cardiovascular;  Laterality: N/A;  . ESOPHAGOGASTRODUODENOSCOPY (EGD) WITH PROPOFOL N/A 02/16/2017   Procedure: ESOPHAGOGASTRODUODENOSCOPY (EGD) WITH PROPOFOL;  Surgeon: Carol Ada, MD;  Location: [facility];  Service: Endoscopy;  Laterality: N/A;  reflux  . TONSILLECTOMY      I have reviewed the social history and family history with the patient and they are unchanged from previous note.  ALLERGIES:  is allergic to contrast media [iodinated diagnostic agents], purell instant hand [alcohol], atorvastatin, dust mite extract, latex, and molds & smuts.  MEDICATIONS:  Current Outpatient Medications  Medication Sig Dispense Refill  . Ascorbic Acid (VITAMIN C PO) Take 1,000 mg by mouth daily.     [name] aspirin 81 MG EC tablet Take 81 mg by mouth daily.    [name] b complex vitamins tablet Take 1 tablet by mouth daily.    . Beta Carotene (VITAMIN A) 25000 UNIT capsule Take 25,000 Units by mouth daily.     [name]  CALCIUM PO Take 1 Dose by mouth daily as needed (bone health).    . Cholecalciferol (VITAMIN D) 10 MCG/ML LIQD Take 10 mcg by mouth daily.    . Cholecalciferol (VITAMIN D3) 5000 units CAPS Take 5,000 Units by mouth daily as needed (bone health).    . furosemide (LASIX) 20 MG tablet Take 20 mg by mouth daily.    [name] lidocaine-prilocaine (EMLA) cream Apply to affected area once daily as needed (Patient taking differently: Apply 1 application topically daily as needed (access port). ) 30 g 3  . Liver Extract (LIVER PO) Take 1 tablet by mouth daily as needed (liver swelling).     [name] losartan (COZAAR) 50 MG tablet Take 50  mg by mouth daily.    [name] MAGNESIUM PO Take 2.5 mLs by mouth daily.    . Menaquinone-7 (VITAMIN K2 PO) Take 1 tablet by mouth daily as needed (low vitamin K).     . Misc Natural Products (DUODENUM) TABS Take 1-5 tablets by mouth daily as needed (stomach swelling).    . ondansetron (ZOFRAN) 8 MG tablet Take 1 tablet (8 mg total) by mouth 2 (two) times daily as needed for refractory nausea / vomiting. Start on day 3 after chemotherapy. 30 tablet 1  . OVER THE COUNTER MEDICATION Take 1-5 tablets by mouth daily as needed (itching). Vitamin b5    . OVER THE COUNTER MEDICATION Take 5-10 tablets by mouth as needed (mold induced itching). SOD superoxide dismutase    . OVER THE COUNTER MEDICATION Take 10 tablets by mouth daily as needed (high blood pressure). Mega-zyme digestive enzyme if systolic bp is over 417     . potassium chloride SA (K-DUR,KLOR-CON) 20 MEQ tablet Take 20 mEq by mouth 2 (two) times daily.     . predniSONE (DELTASONE) 20 MG tablet Take 1 tablet (20 mg total) by mouth daily with breakfast. 5 tablet 0  . prochlorperazine (COMPAZINE) 10 MG tablet Take 1 tablet (10 mg total) by mouth every 6 (six) hours as needed (Nausea or vomiting). 30 tablet 1  . sotalol (BETAPACE) 80 MG tablet Take 1 tablet (80 mg total) by mouth 2 (two) times daily. 60 tablet 6  . vitamin E 400 UNIT capsule Take 400 Units by mouth daily as needed (low vitamin E).     . nitroGLYCERIN (NITROSTAT) 0.4 MG SL tablet Place 1 tablet (0.4 mg total) under the tongue every 5 (five) minutes x 3 doses as needed for chest pain. (Patient not taking: Reported on 04/26/2020) 25 tablet 12  . predniSONE (DELTASONE) 50 MG tablet Take 1 tablet by mouth 5/31 at 6:30pm, then 1 tablet 5/31 at 12:30pm, then last tablet 6/1 at 6:30am w/35m Benadryl at 6:30am for contrast allergy prep 3 tablet 0   No current facility-administered medications for this visit.    PHYSICAL EXAMINATION: ECOG PERFORMANCE STATUS: 1 - Symptomatic but completely  ambulatory  Vitals:   04/26/20 1314 04/26/20 1328  BP: (!) 192/91 (!) 182/96  Pulse: 61   Resp: 18   Temp: 97.9 F (36.6 C)   SpO2: 100%    Filed Weights   04/26/20 1314  Weight: 140 lb (63.5 kg)    GENERAL:alert, no distress and comfortable SKIN: no rash to exposed skin.  EYES:  sclera clear OROPHARYNX: no thrush or ulcers LUNGS:  normal breathing effort HEART:  no lower extremity edema NEURO: alert & oriented x 3 with fluent speech, normal gait Breast exam: deferred   LABORATORY DATA:  I have reviewed  the data as listed CBC Latest Ref Rng & Units 04/26/2020 04/19/2020 04/15/2020  WBC 4.0 - 10.5 K/uL 4.6 6.8 9.4  Hemoglobin 12.0 - 15.0 g/dL 13.5 13.1 13.9  Hematocrit 36 - 46 % 39.8 39.7 41.8  Platelets 150 - 400 K/uL 242 230 228     CMP Latest Ref Rng & Units 04/26/2020 04/19/2020 04/15/2020  Glucose 70 - 99 mg/dL 200(H) 261(H) 173(H)  BUN 8 - 23 mg/dL _0 Creatinine 0.44 - 1.00 mg/dL 0.99 0.99 0.77  Sodium 135 - 145 mmol/L 136 134(L) 136  Potassium 3.5 - 5.1 mmol/L 4.2 3.6 4.1  Chloride 98 - 111 mmol/L 104 103 105  CO2 22 - 32 mmol/L 22 21(L) 23  Calcium 8.9 - 10.3 mg/dL 9.5 9.5 8.8(L)  Total Protein 6.5 - 8.1 g/dL 7.0 6.5 6.3(L)  Total Bilirubin 0.3 - 1.2 mg/dL 0.5 0.6 0.5  Alkaline Phos 38 - 126 U/L 90 91 83  AST 15 - 41 U/L _1 ALT 0 - 44 U/L _2 RADIOGRAPHIC STUDIES: I have personally reviewed the radiological images as listed and agreed with the findings in the report. No results found.   ASSESSMENT & PLAN:   1. Malignant neoplasm of upper-outer quadrant of left breast in female, estrogen receptor positive  -diagnosed 03/22/2020:diffuse left breast swelling. skin thickening and two areas of irregular hypoechogenicity in the 2-3 o'clock region, 4.9cm, and at least 4 abnormal lymph nodes with cortical thickening.  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  Subacute and chronic rib fractures right chest.  No evidence of metastatic disease. -treatment plan per Dr. Lindi Adie to include neoadjuvant chemo with CMF starting 04/19/20 followed by surgical resection, adjuvant radiation, and anti-estrogen therapy    Disposition:  Colleen Lowery appears stable. She is s/p neoadjuvant CMF cycle 1 on 04/19/20. She tolerated treatment very well except mild fatigue. She remained functional and recovered well. CBC and CMP are normal except BG 200. She will return for f/u with Dr. Lindi Adie and cycle 2 in 2 weeks as planned. She will see social worker today.    All questions were answered. The patient knows to call the clinic with any problems, questions or concerns. No barriers to learning was detected.     Colleen Feeling, NP 04/26/20

## 2020-04-23 NOTE — Progress Notes (Signed)
Radiation Oncology         [phone] ________________________________  Name: Colleen Lowery MRN: [id number]  Date: 04/20/2020  DOB: 1941/03/09  WU:[id number], [name], MD  Erroll Luna, MD     REFERRING PHYSICIAN: Erroll Luna, MD   DIAGNOSIS: The encounter diagnosis was Malignant neoplasm of upper-outer quadrant of left breast in female, estrogen receptor positive ([place]).   HISTORY OF PRESENT ILLNESS::Colleen Lowery is a 79 y.o. female who is seen for an initial consultation visit regarding the patient's diagnosis of locally advanced left-sided breast cancer.  The patient was noted to have some swelling in the left breast indicative it is felt either of skin invasion or inflammatory disease.  She was found to have a 4.9 cm tumor as well as at least nodes side.  Biopsy proved invasive ductal carcinoma.  Systemic imaging has not revealed any evidence for disease.  The patient has met with both surgery and medical oncology.  The plan is to proceed with neoadjuvant chemotherapy followed by a mastectomy with axillary lymph node dissection.  I discussed with the patient that she is a good candidate at the appropriate time for postmastectomy radiation treatment.    PREVIOUS RADIATION THERAPY: No   PAST MEDICAL HISTORY:  has a past medical history of Allergy, Arthritis, Cancer ([place]), Cataract, Crohn disease ([place]), Depression, Diabetes mellitus without complication ([place]), GERD (gastroesophageal reflux disease), Headache, Heart failure, acute, systolic and diastolic ([place]) (MARCH 1478 DX), History of colonic polyps, Hypertension, IBS (irritable bowel syndrome), Seizures ([place]), Thyroid disease, Wears dentures, and Wears glasses.     PAST SURGICAL HISTORY: Past Surgical History:  Procedure Laterality Date  . ABDOMINAL HYSTERECTOMY     PARTIAL  . BREAST SURGERY    . CARDIAC CATHETERIZATION  02/10/2016   Procedure: Right/Left Heart Cath and Coronary Angiography;  Surgeon: Leonie Man, MD;   Location: [facility];  Service: Cardiovascular;;  . CATARACT EXTRACTION W/ INTRAOCULAR LENS IMPLANT     left  . COLONOSCOPY WITH PROPOFOL N/A 02/16/2017   Procedure: COLONOSCOPY WITH PROPOFOL;  Surgeon: Carol Ada, MD;  Location: [facility];  Service: Endoscopy;  Laterality: N/A;  . EP IMPLANTABLE DEVICE N/A 02/10/2016   Procedure: ICD Implant;  Surgeon: Will Meredith Leeds, MD;  Location: [facility];  Service: Cardiovascular;  Laterality: N/A;  . ESOPHAGOGASTRODUODENOSCOPY (EGD) WITH PROPOFOL N/A 02/16/2017   Procedure: ESOPHAGOGASTRODUODENOSCOPY (EGD) WITH PROPOFOL;  Surgeon: Carol Ada, MD;  Location: [facility];  Service: Endoscopy;  Laterality: N/A;  reflux  . TONSILLECTOMY       FAMILY HISTORY: family history includes Diabetes in her brother, maternal grandmother, mother, and paternal grandfather; Heart failure in her paternal grandfather; Hypertension in her brother; Stroke in her mother.   SOCIAL HISTORY:  reports that she has been smoking cigarettes. She has a 25.00 pack-year smoking history. She has never used smokeless tobacco. She reports that she does not drink alcohol and does not use drugs.   ALLERGIES: Contrast media [iodinated diagnostic agents], Purell instant hand [alcohol], Atorvastatin, Dust mite extract, Latex, and Molds & smuts   MEDICATIONS:  Current Outpatient Medications  Medication Sig Dispense Refill  . Ascorbic Acid (VITAMIN C PO) Take 1,000 mg by mouth daily.     [name] aspirin 81 MG EC tablet Take 81 mg by mouth daily.    [name] b complex vitamins tablet Take 1 tablet by mouth daily.    . Beta Carotene (VITAMIN A) 25000 UNIT capsule Take 25,000 Units by mouth daily.     [name]  CALCIUM PO Take 1 Dose by mouth daily as needed (bone health).    . Cholecalciferol (VITAMIN D) 10 MCG/ML LIQD Take 10 mcg by mouth daily.    . Cholecalciferol (VITAMIN D3) 5000 units CAPS Take 5,000 Units by mouth daily as needed (bone health).    . furosemide (LASIX) 20 MG  tablet Take 20 mg by mouth daily.    [name] lidocaine-prilocaine (EMLA) cream Apply to affected area once daily as needed (Patient taking differently: Apply 1 application topically daily as needed (access port). ) 30 g 3  . Liver Extract (LIVER PO) Take 1 tablet by mouth daily as needed (liver swelling).     [name] losartan (COZAAR) 50 MG tablet Take 50 mg by mouth daily.    [name] MAGNESIUM PO Take 2.5 mLs by mouth daily.    . Menaquinone-7 (VITAMIN K2 PO) Take 1 tablet by mouth daily as needed (low vitamin K).     . Misc Natural Products (DUODENUM) TABS Take 1-5 tablets by mouth daily as needed (stomach swelling).    . nitroGLYCERIN (NITROSTAT) 0.4 MG SL tablet Place 1 tablet (0.4 mg total) under the tongue every 5 (five) minutes x 3 doses as needed for chest pain. 25 tablet 12  . ondansetron (ZOFRAN) 8 MG tablet Take 1 tablet (8 mg total) by mouth 2 (two) times daily as needed for refractory nausea / vomiting. Start on day 3 after chemotherapy. 30 tablet 1  . OVER THE COUNTER MEDICATION Take 1-5 tablets by mouth daily as needed (itching). Vitamin b5    . OVER THE COUNTER MEDICATION Take 5-10 tablets by mouth as needed (mold induced itching). SOD superoxide dismutase    . OVER THE COUNTER MEDICATION Take 10 tablets by mouth daily as needed (high blood pressure). Mega-zyme digestive enzyme if systolic bp is over 416     . potassium chloride SA (K-DUR,KLOR-CON) 20 MEQ tablet Take 20 mEq by mouth 2 (two) times daily.     . predniSONE (DELTASONE) 20 MG tablet Take 1 tablet (20 mg total) by mouth daily with breakfast. 5 tablet 0  . predniSONE (DELTASONE) 50 MG tablet Take 1 tablet by mouth 5/31 at 6:30pm, then 1 tablet 5/31 at 12:30pm, then last tablet 6/1 at 6:30am w/'50mg'$  Benadryl at 6:30am for contrast allergy prep 3 tablet 0  . prochlorperazine (COMPAZINE) 10 MG tablet Take 1 tablet (10 mg total) by mouth every 6 (six) hours as needed (Nausea or vomiting). 30 tablet 1  . sotalol (BETAPACE) 80 MG tablet Take 1  tablet (80 mg total) by mouth 2 (two) times daily. 60 tablet 6  . vitamin E 400 UNIT capsule Take 400 Units by mouth daily as needed (low vitamin E).      No current facility-administered medications for this encounter.     REVIEW OF SYSTEMS:  A 15 point review of systems is documented in the electronic medical record. This was obtained by the nursing staff. However, I reviewed this with the patient to discuss relevant findings and make appropriate changes.  Pertinent items are noted in HPI.    PHYSICAL EXAM:  height is '5\' 2"'$  (1.575 m) and weight is 139 lb (63 kg).   ECOG = 0  0 - Asymptomatic (Fully active, able to carry on all predisease activities without restriction)  1 - Symptomatic but completely ambulatory (Restricted in physically strenuous activity but ambulatory and able to carry out work of a light or sedentary nature. For example, light housework, office work)  2 -  Symptomatic, <50% in bed during the day (Ambulatory and capable of all self care but unable to carry out any work activities. Up and about more than 50% of waking hours)  3 - Symptomatic, >50% in bed, but not bedbound (Capable of only limited self-care, confined to bed or chair 50% or more of waking hours)  4 - Bedbound (Completely disabled. Cannot carry on any self-care. Totally confined to bed or chair)  5 - Death   Eustace Pen MM, Creech RH, Tormey DC, et al. [phone]). "Toxicity and response criteria of the [facility]". [place] Oncol. 5 (6): 649-55     LABORATORY DATA:  Lab Results  Component Value Date   WBC 6.8 04/19/2020   HGB 13.1 04/19/2020   HCT 39.7 04/19/2020   MCV 92.8 04/19/2020   PLT 230 04/19/2020   Lab Results  Component Value Date   NA 134 (L) 04/19/2020   K 3.6 04/19/2020   CL 103 04/19/2020   CO2 21 (L) 04/19/2020   Lab Results  Component Value Date   ALT 12 04/19/2020   AST 15 04/19/2020   ALKPHOS 91 04/19/2020   BILITOT 0.6 04/19/2020       RADIOGRAPHY: DG Chest 2 View  Result Date: 04/15/2020 CLINICAL DATA:  Syncope. EXAM: CHEST - 2 VIEW COMPARISON:  CT chest dated April 13, 2020. Chest x-ray dated May 27, 2016. FINDINGS: Unchanged left chest wall pacemaker. Stable mild cardiomegaly. Normal pulmonary vascularity. No focal consolidation, pleural effusion, or pneumothorax. No acute osseous abnormality. Old right-sided rib fractures. IMPRESSION: No active cardiopulmonary disease. Electronically Signed   By: Titus Dubin M.D.   On: 04/15/2020 20:19   CT HEAD WO CONTRAST  Result Date: 04/15/2020 CLINICAL DATA:  79 year old female with seizure. No trauma. EXAM: CT HEAD WITHOUT CONTRAST CT CERVICAL SPINE WITHOUT CONTRAST TECHNIQUE: Multidetector CT imaging of the head and cervical spine was performed following the standard protocol without intravenous contrast. Multiplanar CT image reconstructions of the cervical spine were also generated. COMPARISON:  Head CT dated 06/20/2012. FINDINGS: CT HEAD FINDINGS Brain: Mild age-related atrophy and chronic microvascular ischemic changes. There is no acute intracranial hemorrhage. No mass effect or midline shift. No extra-axial fluid collection. There is expanded empty sella. Vascular: No hyperdense vessel or unexpected calcification. Skull: Normal. Negative for fracture or focal lesion. Sinuses/Orbits: No acute finding. Other: None CT CERVICAL SPINE FINDINGS Alignment: No acute subluxation. Skull base and vertebrae: No acute fracture. Soft tissues and spinal canal: No prevertebral fluid or swelling. No visible canal hematoma. Disc levels:  No acute findings. Upper chest: Negative. Other: Bilateral carotid bulb calcified plaques. IMPRESSION: 1. No acute intracranial pathology. Mild age-related atrophy and chronic microvascular ischemic changes. 2. No acute/traumatic cervical spine pathology. Electronically Signed   By: Anner Crete M.D.   On: 04/15/2020 19:50   CT Chest W Contrast  Result Date: 04/13/2020  CLINICAL DATA:  Breast cancer staging, new diagnosis breast cancer with multiple metastatic lymph nodes by report. EXAM: CT CHEST, ABDOMEN, AND PELVIS WITH CONTRAST TECHNIQUE: Multidetector CT imaging of the chest, abdomen and pelvis was performed following the standard protocol during bolus administration of intravenous contrast. CONTRAST:  15m OMNIPAQUE IOHEXOL 300 MG/ML  SOLN COMPARISON:  Chest x-ray from 2017, no cross-sectional comparison imaging. FINDINGS: CT CHEST FINDINGS Cardiovascular: LEFT-sided power pack for single lead cardiac pacer defibrillator. Moderate calcified and noncalcified atherosclerotic plaque in the thoracic aorta. No sign of aneurysm. Heart size is normal without pericardial effusion. Central pulmonary vasculature is  unremarkable. Mediastinum/Nodes: LEFT-sided power pack causes streak artifact over the LEFT axilla and supraclavicular regions. LEFT axillary lymphadenopathy is partially visualized. Lymph nodes measuring up to 1.8 cm short axis at the edge of the field of view. No visible subpectoral adenopathy. No internal mammary adenopathy. No mediastinal or hilar adenopathy. Lungs/Pleura: Subtle ground-glass nodule at the RIGHT lung base 7 x 7 mm (image 87, series 6) no additional pulmonary nodules. Airways are patent. Musculoskeletal: Skin thickening over the LEFT breast in the setting of LEFT breast cancer, partially visualized. CT ABDOMEN PELVIS FINDINGS Hepatobiliary: No focal, suspicious hepatic lesion. No Peri cholecystic stranding. Pancreas: Pancreas is normal without focal lesion, ductal dilation or inflammation. Spleen: Spleen is normal size without focal lesion. Adrenals/Urinary Tract: Adrenal glands are normal. Low-density lesion along the upper pole the LEFT kidney measures 32 Hounsfield units post-contrast, is well-circumscribed at 2.0 x 1.9 cm. No hydronephrosis. Scarring of the anterior hilar lip of the LEFT kidney. Stomach/Bowel: No acute gastrointestinal process.  Vascular/Lymphatic: Calcified and noncalcified atheromatous plaque of the abdominal aorta. No evidence of aneurysm. Maximal caliber approximately 2.6 x 2.5 cm. Atheromatous changes extend into the iliac vasculature. Occlusion of LEFT superficial femoral artery just beyond the bifurcation. Profundus femoral is patent. Occlusion likely chronic, no perivascular stranding. Reproductive: Post hysterectomy.  Urinary bladder unremarkable. Other: No hernia. No ascites. Descent of pelvic floor structures suggest pelvic floor dysfunction. Musculoskeletal: No acute bone finding. No destructive bone process. Spinal degenerative changes. Osteopenia. Signs of subacute and chronic rib fractures along the RIGHT chest multiple sites. IMPRESSION: 1. LEFT axillary lymphadenopathy and skin thickening and nodularity about the LEFT breast is partially visualized.w no current signs of distant disease. 2. Subtle ground-glass nodule at the RIGHT lung base, nonspecific. Suggest attention on subsequent imaging, follow-up in 3-6 months may be helpful. 3. 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 No perivascular stranding. The finding likely chronic in the setting of marked vascular disease correlate with any symptoms. 4. Abdominal aortic dilation to 2.5/2.6 cm. Ectatic abdominal aorta at risk for aneurysm development. Recommend followup by ultrasound in 5 years. This recommendation follows ACR consensus guidelines: White Paper of the ACR Incidental Findings Committee II on Vascular Findings. J Am Coll Radiol 2013; 10:789-794. Aortic aneurysm NOS (ICD10-I71.9) 5. Low-density lesion arising from the upper pole the LEFT kidney likely a cyst but with density values slightly greater than 30 Hounsfield units postcontrast. Consider renal sonography or follow-up renal protocol CT for further assessment. 6. Signs of subacute and chronic rib fractures along the RIGHT chest multiple sites. 7. Aortic atherosclerosis.  These results will be called to the ordering clinician or representative by the Radiologist Assistant, and communication documented in the PACS or [employer]. Aortic Atherosclerosis (ICD10-I70.0). Electronically Signed   By: Zetta Bills M.D.   On: 04/13/2020 08:51   CT CERVICAL SPINE WO CONTRAST  Result Date: 04/15/2020 CLINICAL DATA:  79 year old female with seizure. No trauma. EXAM: CT HEAD WITHOUT CONTRAST CT CERVICAL SPINE WITHOUT CONTRAST TECHNIQUE: Multidetector CT imaging of the head and cervical spine was performed following the standard protocol without intravenous contrast. Multiplanar CT image reconstructions of the cervical spine were also generated. COMPARISON:  Head CT dated 06/20/2012. FINDINGS: CT HEAD FINDINGS Brain: Mild age-related atrophy and chronic microvascular ischemic changes. There is no acute intracranial hemorrhage. No mass effect or midline shift. No extra-axial fluid collection. There is expanded empty sella. Vascular: No hyperdense vessel or unexpected calcification. Skull: Normal. Negative for fracture or focal lesion. Sinuses/Orbits:  No acute finding. Other: None CT CERVICAL SPINE FINDINGS Alignment: No acute subluxation. Skull base and vertebrae: No acute fracture. Soft tissues and spinal canal: No prevertebral fluid or swelling. No visible canal hematoma. Disc levels:  No acute findings. Upper chest: Negative. Other: Bilateral carotid bulb calcified plaques. IMPRESSION: 1. No acute intracranial pathology. Mild age-related atrophy and chronic microvascular ischemic changes. 2. No acute/traumatic cervical spine pathology. Electronically Signed   By: Anner Crete M.D.   On: 04/15/2020 19:50   NM Bone Scan Whole Body  Result Date: 04/13/2020 CLINICAL DATA:  79 year old female with inflammatory breast cancer. Metastatic lymph nodes. Staging. EXAM: NUCLEAR MEDICINE WHOLE BODY BONE SCAN TECHNIQUE: Whole body anterior and posterior images were obtained approximately 3  hours after intravenous injection of radiopharmaceutical. RADIOPHARMACEUTICALS:  19.8 mCi Technetium-108mMDP IV COMPARISON:  CT Chest, Abdomen, and Pelvis today are reported separately. FINDINGS: Multiple anterior healing right rib fractures noted on the comparison, which correspond to multiple round foci of anterior right rib radiotracer activity on this exam. Abnormal left breast soft tissue uptake compatible with inflammatory breast cancer. Expected radiotracer activity in both kidneys and the urinary bladder. Outside of the ribs homogeneous radiotracer activity throughout the axial skeleton including the skull and pelvis. Visible appendicular skeleton negative aside from mild to moderate degenerative appearing activity including at the left wrist, both knees, ankles. IMPRESSION: 1.  No findings specific for metastatic disease to bone. 2. 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 3. Benign appearing right anterior rib fractures. Scattered degenerative radiotracer activity in the extremities. Electronically Signed   By: HGenevie AnnM.D.   On: 04/13/2020 10:52   CT Abdomen Pelvis W Contrast  Result Date: 04/13/2020 CLINICAL DATA:  Breast cancer staging, new diagnosis breast cancer with multiple metastatic lymph nodes by report. EXAM: CT CHEST, ABDOMEN, AND PELVIS WITH CONTRAST TECHNIQUE: Multidetector CT imaging of the chest, abdomen and pelvis was performed following the standard protocol during bolus administration of intravenous contrast. CONTRAST:  1075mOMNIPAQUE IOHEXOL 300 MG/ML  SOLN COMPARISON:  Chest x-ray from 2017, no cross-sectional comparison imaging. FINDINGS: CT CHEST FINDINGS Cardiovascular: LEFT-sided power pack for single lead cardiac pacer defibrillator. Moderate calcified and noncalcified atherosclerotic plaque in the thoracic aorta. No sign of aneurysm. Heart size is normal without pericardial effusion. Central pulmonary vasculature is unremarkable.  Mediastinum/Nodes: LEFT-sided power pack causes streak artifact over the LEFT axilla and supraclavicular regions. LEFT axillary lymphadenopathy is partially visualized. Lymph nodes measuring up to 1.8 cm short axis at the edge of the field of view. No visible subpectoral adenopathy. No internal mammary adenopathy. No mediastinal or hilar adenopathy. Lungs/Pleura: Subtle ground-glass nodule at the RIGHT lung base 7 x 7 mm (image 87, series 6) no additional pulmonary nodules. Airways are patent. Musculoskeletal: Skin thickening over the LEFT breast in the setting of LEFT breast cancer, partially visualized. CT ABDOMEN PELVIS FINDINGS Hepatobiliary: No focal, suspicious hepatic lesion. No Peri cholecystic stranding. Pancreas: Pancreas is normal without focal lesion, ductal dilation or inflammation. Spleen: Spleen is normal size without focal lesion. Adrenals/Urinary Tract: Adrenal glands are normal. Low-density lesion along the upper pole the LEFT kidney measures 32 Hounsfield units post-contrast, is well-circumscribed at 2.0 x 1.9 cm. No hydronephrosis. Scarring of the anterior hilar lip of the LEFT kidney. Stomach/Bowel: No acute gastrointestinal process. Vascular/Lymphatic: Calcified and noncalcified atheromatous plaque of the abdominal aorta. No evidence of aneurysm. Maximal caliber approximately 2.6 x 2.5 cm. Atheromatous changes extend into the iliac vasculature. Occlusion of  LEFT superficial femoral artery just beyond the bifurcation. Profundus femoral is patent. Occlusion likely chronic, no perivascular stranding. Reproductive: Post hysterectomy.  Urinary bladder unremarkable. Other: No hernia. No ascites. Descent of pelvic floor structures suggest pelvic floor dysfunction. Musculoskeletal: No acute bone finding. No destructive bone process. Spinal degenerative changes. Osteopenia. Signs of subacute and chronic rib fractures along the RIGHT chest multiple sites. IMPRESSION: 1. LEFT axillary lymphadenopathy and  skin thickening and nodularity about the LEFT breast is partially visualized.w no current signs of distant disease. 2. Subtle ground-glass nodule at the RIGHT lung base, nonspecific. Suggest attention on subsequent imaging, follow-up in 3-6 months may be helpful. 3. 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 No perivascular stranding. The finding likely chronic in the setting of marked vascular disease correlate with any symptoms. 4. Abdominal aortic dilation to 2.5/2.6 cm. Ectatic abdominal aorta at risk for aneurysm development. Recommend followup by ultrasound in 5 years. This recommendation follows ACR consensus guidelines: White Paper of the ACR Incidental Findings Committee II on Vascular Findings. J Am Coll Radiol 2013; 10:789-794. Aortic aneurysm NOS (ICD10-I71.9) 5. Low-density lesion arising from the upper pole the LEFT kidney likely a cyst but with density values slightly greater than 30 Hounsfield units postcontrast. Consider renal sonography or follow-up renal protocol CT for further assessment. 6. Signs of subacute and chronic rib fractures along the RIGHT chest multiple sites. 7. Aortic atherosclerosis. These results will be called to the ordering clinician or representative by the Radiologist Assistant, and communication documented in the PACS or [employer]. Aortic Atherosclerosis (ICD10-I70.0). Electronically Signed   By: Zetta Bills M.D.   On: 04/13/2020 08:51   [place] AXILLARY NODE CORE BIOPSY LEFT  Addendum Date: 04/01/2020   ADDENDUM REPORT: 04/01/2020 13:33 ADDENDUM: Pathology revealed GRADE II INVASIVE DUCTAL CARCINOMA. LYMPHOVASCULAR INVASION PRESENT of the LEFT breast, 2 o'clock. This was found to be concordant by Dr. Lajean Manes. 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 This was found to be concordant by Dr. Lajean Manes. Pathology results were discussed with the patient by telephone. The  patient reported doing well after the biopsies with tenderness at the sites. Post biopsy instructions and care were reviewed and questions were answered. The patient was encouraged to call [facility] for any additional concerns. Surgical consultation has been arranged with Dr. Erroll Luna at [facility] on Apr 05, 2020. Pathology results reported by Stacie Acres RN on 04/01/2020. Electronically Signed   By: Lajean Manes M.D.   On: 04/01/2020 13:33   Result Date: 04/01/2020 CLINICAL DATA:  Patient presents for ultrasound-guided core needle biopsy of masses in the upper outer left breast, centered at 2 o'clock, and 1 of the several abnormal left axillary lymph nodes. 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 EXAM: ULTRASOUND GUIDED LEFT BREAST CORE NEEDLE BIOPSY ULTRASOUND GUIDED LEFT AXILLARY LYMPH NODE CORE NEEDLE BIOPSY COMPARISON:  Previous exam(s). PROCEDURE: I met with the patient and we discussed the procedure of ultrasound-guided biopsy, including benefits and alternatives. We discussed the high likelihood of a successful procedure. We discussed the risks of the procedure, including infection, bleeding, tissue injury, clip migration, and inadequate sampling. Informed written consent was given. The usual time-out protocol was performed immediately prior to the procedure. Lesion #1: Left breast mass. Lesion quadrant: Upper outer quadrant Using sterile technique and 1% Lidocaine as local anesthetic, under direct ultrasound visualization, a 12 gauge spring-loaded  device was used to perform biopsy of masses at the 2 o'clock position of the left breast using an inferior approach. At the conclusion of the procedure ribbon shaped tissue marker clip was deployed into the biopsy cavity. Lesion #2: Left axillary lymph node. Lesion location: Inferior left axilla. Using sterile technique and 1% Lidocaine as local anesthetic, under direct  ultrasound visualization, a 14 gauge spring-loaded device was used to perform biopsy of 1 of the abnormal left axillary lymph nodes using an inferior/lateral approach. At the conclusion of the procedure Q tissue marker clip was deployed into the biopsy cavity. Follow up 2 view mammogram was performed and dictated separately. IMPRESSION: Ultrasound guided biopsy of the left breast and an abnormal left axillary lymph node. No apparent complications. Electronically Signed: By: Lajean Manes M.D. On: 03/31/2020 13:45   MM CLIP PLACEMENT LEFT  Result Date: 03/31/2020 CLINICAL DATA:  Evaluate marker clip placement following ultrasound-guided core needle biopsy of a left breast mass and left axillary lymph node. EXAM: DIAGNOSTIC LEFT MAMMOGRAM POST ULTRASOUND BIOPSY COMPARISON:  Prior exams FINDINGS: Mammographic images were obtained following ultrasound guided biopsy of an upper outer quadrant left breast mass and an abnormal left axillary lymph node. The ribbon shaped biopsy clip lies along the posterior margin of the upper-outer quadrant masses and the Q shaped biopsy clip, partly imaged, lies in the left axilla along the anterior margin of an abnormal lymph node. IMPRESSION: Appropriate positioning of the ribbon and Q shaped biopsy marking clips at the site of biopsy in the in the upper outer left breast and left axilla. Final Assessment: Post Procedure Mammograms for Marker Placement Electronically Signed   By: Lajean Manes M.D.   On: 03/31/2020 14:08   [place] LT BREAST BX W LOC DEV 1ST LESION IMG BX SPEC US GUIDE  Addendum Date: 04/01/2020   ADDENDUM REPORT: 04/01/2020 13:33 ADDENDUM: Pathology revealed GRADE II INVASIVE DUCTAL CARCINOMA. LYMPHOVASCULAR INVASION PRESENT of the LEFT breast, 2 o'clock. This was found to be concordant by Dr. Lajean Manes. 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 This was found to be concordant by Dr. Lajean Manes. Pathology  results were discussed with the patient by telephone. The patient reported doing well after the biopsies with tenderness at the sites. Post biopsy instructions and care were reviewed and questions were answered. The patient was encouraged to call [facility] for any additional concerns. Surgical consultation has been arranged with Dr. Erroll Luna at [facility] on Apr 05, 2020. Pathology results reported by Stacie Acres RN on 04/01/2020. Electronically Signed   By: Lajean Manes M.D.   On: 04/01/2020 13:33   Result Date: 04/01/2020 CLINICAL DATA:  Patient presents for ultrasound-guided core needle biopsy of masses in the upper outer left breast, centered at 2 o'clock, and 1 of the several abnormal left axillary lymph nodes. 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 EXAM: ULTRASOUND GUIDED LEFT BREAST CORE NEEDLE BIOPSY ULTRASOUND GUIDED LEFT AXILLARY LYMPH NODE CORE NEEDLE BIOPSY COMPARISON:  Previous exam(s). PROCEDURE: I met with the patient and we discussed the procedure of ultrasound-guided biopsy, including benefits and alternatives. We discussed the high likelihood of a successful procedure. We discussed the risks of the procedure, including infection, bleeding, tissue injury, clip migration, and inadequate sampling. Informed written consent was given. The usual time-out protocol was performed immediately prior to the procedure. Lesion #1: Left breast mass. Lesion quadrant: Upper outer quadrant  Using sterile technique and 1% Lidocaine as local anesthetic, under direct ultrasound visualization, a 12 gauge spring-loaded device was used to perform biopsy of masses at the 2 o'clock position of the left breast using an inferior approach. At the conclusion of the procedure ribbon shaped tissue marker clip was deployed into the biopsy cavity. Lesion #2: Left axillary lymph node. Lesion location: Inferior left axilla. Using sterile technique  and 1% Lidocaine as local anesthetic, under direct ultrasound visualization, a 14 gauge spring-loaded device was used to perform biopsy of 1 of the abnormal left axillary lymph nodes using an inferior/lateral approach. At the conclusion of the procedure Q tissue marker clip was deployed into the biopsy cavity. Follow up 2 view mammogram was performed and dictated separately. IMPRESSION: Ultrasound guided biopsy of the left breast and an abnormal left axillary lymph node. No apparent complications. Electronically Signed: By: Lajean Manes M.D. On: 03/31/2020 13:45       IMPRESSION:  The patient has a recent diagnosis of left-sided breast cancer, T4b N1 M0.  Patient has a 4.9 cm tumor with at least 4 abnormal lymph nodes.   PLAN: The patient will proceed with neoadjuvant chemotherapy followed by surgery, anticipated to be a mastectomy with axillary lymph node dissection.  The patient is an appropriate candidate for postmastectomy radiation treatment.  This will improve local/regional control.  I anticipate 6-1/2 weeks of radiation treatment to the left chest wall and regional lymph nodes.  All of this was discussed with the patient in some detail including the logistics of treatment.  All of her questions were answered.  We did discuss the rationale of treatment as well as the possible side effects and risks.  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  Given the target region for her planned treatment, this will be in the treatment field and ideally this can be moved to the contralateral side.  I will further discuss this with surgery and we will need to coordinate this with cardiology as well.    I look forward to seeing the patient in the future to further discuss and coordinate her care.     Given current concerns for patient exposure during the COVID-19 pandemic, this encounter was conducted via telephone.  The patient has given verbal consent for this type of encounter. The time  spent on this encounter was 45 minutes, including medical records review discussion with the patient, and coordination of care today. The attendants for this meeting included Dr. Lisbeth Renshaw, and the patient  During the encounter Dr. Lisbeth Renshaw was located at [facility]. The patient  was located at home.  ________________________________   Jodelle Gross, MD, PhD   **Disclaimer: This note was dictated with voice recognition software. Similar sounding words can inadvertently be transcribed and this note may contain transcription errors which may not have been corrected upon publication of note.**

## 2020-04-25 ENCOUNTER — Other Ambulatory Visit: Payer: Self-pay | Admitting: Radiology

## 2020-04-26 ENCOUNTER — Inpatient Hospital Stay: Payer: Medicare HMO

## 2020-04-26 ENCOUNTER — Encounter: Payer: Self-pay | Admitting: Nurse Practitioner

## 2020-04-26 ENCOUNTER — Encounter: Payer: Self-pay | Admitting: Licensed Clinical Social Worker

## 2020-04-26 ENCOUNTER — Inpatient Hospital Stay: Payer: Medicare HMO | Admitting: Licensed Clinical Social Worker

## 2020-04-26 ENCOUNTER — Inpatient Hospital Stay (HOSPITAL_BASED_OUTPATIENT_CLINIC_OR_DEPARTMENT_OTHER): Payer: Medicare HMO | Admitting: Nurse Practitioner

## 2020-04-26 ENCOUNTER — Encounter: Payer: Self-pay | Admitting: *Deleted

## 2020-04-26 ENCOUNTER — Other Ambulatory Visit: Payer: Self-pay

## 2020-04-26 ENCOUNTER — Encounter: Payer: Self-pay | Admitting: Hematology and Oncology

## 2020-04-26 VITALS — BP 182/96 | HR 61 | Temp 97.9°F | Resp 18 | Ht 62.0 in | Wt 140.0 lb

## 2020-04-26 DIAGNOSIS — C50412 Malignant neoplasm of upper-outer quadrant of left female breast: Secondary | ICD-10-CM | POA: Diagnosis not present

## 2020-04-26 DIAGNOSIS — Z170 Estrogen receptor positive status [ER+]: Secondary | ICD-10-CM

## 2020-04-26 DIAGNOSIS — Z5111 Encounter for antineoplastic chemotherapy: Secondary | ICD-10-CM | POA: Diagnosis not present

## 2020-04-26 LAB — CMP (CANCER CENTER ONLY)
ALT: 12 U/L (ref 0–44)
AST: 16 U/L (ref 15–41)
Albumin: 3.7 g/dL (ref 3.5–5.0)
Alkaline Phosphatase: 90 U/L (ref 38–126)
Anion gap: 10 (ref 5–15)
BUN: 13 mg/dL (ref 8–23)
CO2: 22 mmol/L (ref 22–32)
Calcium: 9.5 mg/dL (ref 8.9–10.3)
Chloride: 104 mmol/L (ref 98–111)
Creatinine: 0.99 mg/dL (ref 0.44–1.00)
GFR, Est AFR Am: >60 mL/min (ref >60)
GFR, Estimated: 55 mL/min — ABNORMAL LOW (ref >60)
Glucose, Bld: 200 mg/dL — ABNORMAL HIGH (ref 70–99)
Potassium: 4.2 mmol/L (ref 3.5–5.1)
Sodium: 136 mmol/L (ref 135–145)
Total Bilirubin: 0.5 mg/dL (ref 0.3–1.2)
Total Protein: 7 g/dL (ref 6.5–8.1)

## 2020-04-26 LAB — CBC WITH DIFFERENTIAL (CANCER CENTER ONLY)
Abs Immature Granulocytes: 0.02 10*3/uL (ref 0.00–0.07)
Basophils Absolute: 0 10*3/uL (ref 0.0–0.1)
Basophils Relative: 1 %
Eosinophils Absolute: 0.1 10*3/uL (ref 0.0–0.5)
Eosinophils Relative: 3 %
HCT: 39.8 % (ref 36.0–46.0)
Hemoglobin: 13.5 g/dL (ref 12.0–15.0)
Immature Granulocytes: 0 %
Lymphocytes Relative: 35 %
Lymphs Abs: 1.6 10*3/uL (ref 0.7–4.0)
MCH: 31 pg (ref 26.0–34.0)
MCHC: 33.9 g/dL (ref 30.0–36.0)
MCV: 91.5 fL (ref 80.0–100.0)
Monocytes Absolute: 0.3 10*3/uL (ref 0.1–1.0)
Monocytes Relative: 7 %
Neutro Abs: 2.5 10*3/uL (ref 1.7–7.7)
Neutrophils Relative %: 54 %
Platelet Count: 242 10*3/uL (ref 150–400)
RBC: 4.35 MIL/uL (ref 3.87–5.11)
RDW: 13.1 % (ref 11.5–15.5)
WBC Count: 4.6 10*3/uL (ref 4.0–10.5)
nRBC: 0 % (ref 0.0–0.2)
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
## 2020-04-26 NOTE — Progress Notes (Signed)
Met with patient at registration whom provided income for [employer].  Patient approved for one-time $1000 grant to assist with personal expenses while going through treatment. Gave patient a copy of the approval letter and expense sheet along with the Outpatient Pharmacy information. She received a gas card today from grant.  She has my card for any additional financial questions or concerns.

## 2020-04-27 ENCOUNTER — Other Ambulatory Visit: Payer: Self-pay

## 2020-04-27 ENCOUNTER — Telehealth: Payer: Self-pay | Admitting: Radiation Oncology

## 2020-04-27 ENCOUNTER — Ambulatory Visit (HOSPITAL_COMMUNITY)
Admission: RE | Admit: 2020-04-27 | Discharge: 2020-04-27 | Disposition: A | Discharge status: Home / Self Care | Payer: Medicare HMO | Source: Ambulatory Visit | Attending: Surgery | Admitting: Surgery

## 2020-04-27 ENCOUNTER — Encounter (HOSPITAL_COMMUNITY): Payer: Self-pay

## 2020-04-27 DIAGNOSIS — Z90711 Acquired absence of uterus with remaining cervical stump: Secondary | ICD-10-CM | POA: Insufficient documentation

## 2020-04-27 DIAGNOSIS — I5041 Acute combined systolic (congestive) and diastolic (congestive) heart failure: Secondary | ICD-10-CM | POA: Insufficient documentation

## 2020-04-27 DIAGNOSIS — E1136 Type 2 diabetes mellitus with diabetic cataract: Secondary | ICD-10-CM | POA: Insufficient documentation

## 2020-04-27 DIAGNOSIS — Z8669 Personal history of other diseases of the nervous system and sense organs: Secondary | ICD-10-CM | POA: Diagnosis not present

## 2020-04-27 DIAGNOSIS — C50912 Malignant neoplasm of unspecified site of left female breast: Secondary | ICD-10-CM | POA: Diagnosis present

## 2020-04-27 DIAGNOSIS — Z8249 Family history of ischemic heart disease and other diseases of the circulatory system: Secondary | ICD-10-CM | POA: Diagnosis not present

## 2020-04-27 DIAGNOSIS — I110 Hypertensive heart disease with heart failure: Secondary | ICD-10-CM | POA: Diagnosis not present

## 2020-04-27 DIAGNOSIS — M19042 Primary osteoarthritis, left hand: Secondary | ICD-10-CM | POA: Diagnosis not present

## 2020-04-27 DIAGNOSIS — M19041 Primary osteoarthritis, right hand: Secondary | ICD-10-CM | POA: Insufficient documentation

## 2020-04-27 DIAGNOSIS — F17210 Nicotine dependence, cigarettes, uncomplicated: Secondary | ICD-10-CM | POA: Diagnosis not present

## 2020-04-27 DIAGNOSIS — K219 Gastro-esophageal reflux disease without esophagitis: Secondary | ICD-10-CM | POA: Insufficient documentation

## 2020-04-27 DIAGNOSIS — K589 Irritable bowel syndrome without diarrhea: Secondary | ICD-10-CM | POA: Insufficient documentation

## 2020-04-27 DIAGNOSIS — Z833 Family history of diabetes mellitus: Secondary | ICD-10-CM | POA: Diagnosis not present

## 2020-04-27 HISTORY — PX: IR IMAGING GUIDED PORT INSERTION: IMG5740

## 2020-04-27 LAB — CBC WITH DIFFERENTIAL/PLATELET
Abs Immature Granulocytes: 0.04 10*3/uL (ref 0.00–0.07)
Basophils Absolute: 0.1 10*3/uL (ref 0.0–0.1)
Basophils Relative: 1 %
Eosinophils Absolute: 0.1 10*3/uL (ref 0.0–0.5)
Eosinophils Relative: 3 %
HCT: 39.9 % (ref 36.0–46.0)
Hemoglobin: 13.2 g/dL (ref 12.0–15.0)
Immature Granulocytes: 1 %
Lymphocytes Relative: 31 %
Lymphs Abs: 1.4 10*3/uL (ref 0.7–4.0)
MCH: 30.9 pg (ref 26.0–34.0)
MCHC: 33.1 g/dL (ref 30.0–36.0)
MCV: 93.4 fL (ref 80.0–100.0)
Monocytes Absolute: 0.4 10*3/uL (ref 0.1–1.0)
Monocytes Relative: 9 %
Neutro Abs: 2.5 10*3/uL (ref 1.7–7.7)
Neutrophils Relative %: 55 %
Platelets: 231 10*3/uL (ref 150–400)
RBC: 4.27 MIL/uL (ref 3.87–5.11)
RDW: 13.2 % (ref 11.5–15.5)
WBC: 4.6 10*3/uL (ref 4.0–10.5)
nRBC: 0 % (ref 0.0–0.2)

## 2020-04-27 MED ORDER — HEPARIN SOD (PORK) LOCK FLUSH 100 UNIT/ML IV SOLN
INTRAVENOUS | Status: AC
  Filled 2020-04-27: qty 5

## 2020-04-27 MED ORDER — LIDOCAINE-EPINEPHRINE 1 %-1:100000 IJ SOLN
INTRAMUSCULAR | Status: AC | PRN
  Administered 2020-04-27: 10 mL

## 2020-04-27 MED ORDER — LIDOCAINE-EPINEPHRINE 1 %-1:100000 IJ SOLN
INTRAMUSCULAR | Status: AC
  Filled 2020-04-27: qty 1

## 2020-04-27 MED ORDER — SODIUM CHLORIDE 0.9 % IV SOLN: INTRAVENOUS | Status: DC

## 2020-04-27 MED ORDER — FENTANYL CITRATE (PF) 100 MCG/2ML IJ SOLN
INTRAMUSCULAR | Status: AC | PRN
  Administered 2020-04-27: 50 ug via INTRAVENOUS

## 2020-04-27 MED ORDER — MIDAZOLAM HCL 2 MG/2ML IJ SOLN
INTRAMUSCULAR | Status: AC | PRN
  Administered 2020-04-27: 1 mg via INTRAVENOUS

## 2020-04-27 MED ORDER — CEFAZOLIN SODIUM-DEXTROSE 2-4 GM/100ML-% IV SOLN
INTRAVENOUS | Status: AC
  Administered 2020-04-27: 2 g via INTRAVENOUS
  Filled 2020-04-27: qty 100

## 2020-04-27 MED ORDER — DIPHENHYDRAMINE HCL 50 MG/ML IJ SOLN
INTRAMUSCULAR | Status: AC
  Filled 2020-04-27: qty 1

## 2020-04-27 MED ORDER — FENTANYL CITRATE (PF) 100 MCG/2ML IJ SOLN
INTRAMUSCULAR | Status: AC
  Filled 2020-04-27: qty 2

## 2020-04-27 MED ORDER — MIDAZOLAM HCL 2 MG/2ML IJ SOLN
INTRAMUSCULAR | Status: AC
  Filled 2020-04-27: qty 4

## 2020-04-27 MED ORDER — CEFAZOLIN SODIUM-DEXTROSE 2-4 GM/100ML-% IV SOLN: 2.0000 g | INTRAVENOUS | Status: AC

## 2020-04-27 NOTE — Consult Note (Signed)
Chief Complaint: Patient was seen in consultation today for Port-A-Cath placement  Referring Physician(s): Burnham  Supervising Physician: Jacqulynn Cadet  Patient Status: [facility] - Out-pt  History of Present Illness: Colleen Lowery is a 79 y.o. female with history of recently diagnosed left breast carcinoma who presents today for Port-A-Cath placement for chemotherapy.  Additional medical history as below.  Past Medical History:  Diagnosis Date  . Allergy   . Arthritis    FINGERS  . Cancer [facility])    left breast  . Cataract    right  . Crohn disease ([place])   . Depression   . Diabetes mellitus without complication ([place])    TYPE 2 , TAKING OTC MED FORGYMEMA  . GERD (gastroesophageal reflux disease)   . Headache    sinus  . Heart failure, acute, systolic and diastolic ([place]) MARCH 3267 DX  . History of colonic polyps   . Hypertension   . IBS (irritable bowel syndrome)   . 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 Thyroid disease    AGE 45'S UNTIL  2016 OFF ALL THRYOID MEDS NOW   . Wears dentures   . Wears glasses     Past Surgical History:  Procedure Laterality Date  . ABDOMINAL HYSTERECTOMY     PARTIAL  . BREAST SURGERY    . CARDIAC CATHETERIZATION  02/10/2016   Procedure: Right/Left Heart Cath and Coronary Angiography;  Surgeon: Leonie Man, MD;  Location: [place] CV LAB;  Service: Cardiovascular;;  . CATARACT EXTRACTION W/ INTRAOCULAR LENS IMPLANT     left  . COLONOSCOPY WITH PROPOFOL N/A 02/16/2017   Procedure: COLONOSCOPY WITH PROPOFOL;  Surgeon: Carol Ada, MD;  Location: [facility];  Service: Endoscopy;  Laterality: N/A;  . EP IMPLANTABLE DEVICE N/A 02/10/2016   Procedure: ICD Implant;  Surgeon: Will Meredith Leeds, MD;  Location: [place] CV LAB;  Service: Cardiovascular;  Laterality: N/A;  . ESOPHAGOGASTRODUODENOSCOPY (EGD) WITH PROPOFOL N/A 02/16/2017   Procedure: ESOPHAGOGASTRODUODENOSCOPY (EGD) WITH PROPOFOL;  Surgeon:  Carol Ada, MD;  Location: [facility];  Service: Endoscopy;  Laterality: N/A;  reflux  . TONSILLECTOMY      Allergies: Contrast media [iodinated diagnostic agents], Purell instant hand [alcohol], Atorvastatin, Dust mite extract, Latex, and Molds & smuts  Medications: Prior to Admission medications   Medication Sig Start Date End Date Taking? Authorizing Provider  Ascorbic Acid (VITAMIN C PO) Take 1,000 mg by mouth daily.     [provider]  aspirin 81 MG EC tablet Take 81 mg by mouth daily. 01/20/20   [provider]  b complex vitamins tablet Take 1 tablet by mouth daily.    [provider]  Beta Carotene (VITAMIN A) 25000 UNIT capsule Take 25,000 Units by mouth daily.     [provider]  CALCIUM PO Take 1 Dose by mouth daily as needed (bone health).    [provider]  Cholecalciferol (VITAMIN D) 10 MCG/ML LIQD Take 10 mcg by mouth daily.    [provider]  Cholecalciferol (VITAMIN D3) 5000 units CAPS Take 5,000 Units by mouth daily as needed (bone health).    [provider]  furosemide (LASIX) 20 MG tablet Take 20 mg by mouth daily.    [provider]  lidocaine-prilocaine (EMLA) cream Apply to affected area once daily as needed Patient taking differently: Apply 1 application topically daily as needed (access port).  04/13/20   Nicholas Lose, MD  Liver  Extract (LIVER PO) Take 1 tablet by mouth daily as needed (liver swelling).     [provider]  losartan (COZAAR) 50 MG tablet Take 50 mg by mouth daily. 12/16/19   [provider]  MAGNESIUM PO Take 2.5 mLs by mouth daily.    [provider]  Menaquinone-7 (VITAMIN K2 PO) Take 1 tablet by mouth daily as needed (low vitamin K).     [provider]  Misc Natural Products (DUODENUM) TABS Take 1-5 tablets by mouth daily as needed (stomach swelling).    [provider]  nitroGLYCERIN (NITROSTAT) 0.4 MG SL tablet Place 1 tablet  (0.4 mg total) under the tongue every 5 (five) minutes x 3 doses as needed for chest pain. Patient not taking: Reported on 04/26/2020 02/12/16   Brett Canales, PA-C  ondansetron (ZOFRAN) 8 MG tablet Take 1 tablet (8 mg total) by mouth 2 (two) times daily as needed for refractory nausea / vomiting. Start on day 3 after chemotherapy. 04/19/20   Nicholas Lose, MD  OVER THE COUNTER MEDICATION Take 1-5 tablets by mouth daily as needed (itching). Vitamin b5    [provider]  OVER THE COUNTER MEDICATION Take 5-10 tablets by mouth as needed (mold induced itching). SOD superoxide dismutase    [provider]  OVER THE COUNTER MEDICATION Take 10 tablets by mouth daily as needed (high blood pressure). Mega-zyme digestive enzyme if systolic bp is over 536     [provider]  potassium chloride SA (K-DUR,KLOR-CON) 20 MEQ tablet Take 20 mEq by mouth 2 (two) times daily.     [provider]  predniSONE (DELTASONE) 20 MG tablet Take 1 tablet (20 mg total) by mouth daily with breakfast. 04/09/20   Causey, Charlestine Massed, NP  predniSONE (DELTASONE) 50 MG tablet Take 1 tablet by mouth 5/31 at 6:30pm, then 1 tablet 5/31 at 12:30pm, then last tablet 6/1 at 6:30am w/74m Benadryl at 6:30am for contrast allergy prep 04/09/20   GNicholas Lose MD  prochlorperazine (COMPAZINE) 10 MG tablet Take 1 tablet (10 mg total) by mouth every 6 (six) hours as needed (Nausea or vomiting). 04/19/20   GNicholas Lose MD  sotalol (BETAPACE) 80 MG tablet Take 1 tablet (80 mg total) by mouth 2 (two) times daily. 02/25/16   UBaldwin Jamaica PA-C  vitamin E 400 UNIT capsule Take 400 Units by mouth daily as needed (low vitamin E).     [provider]     Family History  Problem Relation Age of Onset  . Diabetes Mother   . Stroke Mother   . Diabetes Brother   . Diabetes Maternal Grandmother   . Diabetes Paternal Grandfather   . Heart failure Paternal Grandfather   . Hypertension Brother   . Heart  attack Neg Hx     Social History   Socioeconomic History  . Marital status: Single    Spouse name: Not on file  . Number of children: Not on file  . Years of education: Not on file  . Highest education level: Not on file  Occupational History  . Not on file  Tobacco Use  . Smoking status: Current Every Day Smoker    Packs/day: 0.50    Years: 50.00    Pack years: 25.00    Types: Cigarettes  . Smokeless tobacco: Never Used  . Tobacco comment: 3 puffs of each cigarette  Vaping Use  . Vaping Use: Never used  Substance and Sexual Activity  . Alcohol use: No  .  Drug use: No  . Sexual activity: Never  Other Topics Concern  . Not on file  Social History Narrative  . Not on file   Social Determinants of Health   Financial Resource Strain:   . Difficulty of Paying Living Expenses:   Food Insecurity:   . Worried About Charity fundraiser in the Last Year:   . Arboriculturist in the Last Year:   Transportation Needs:   . Film/video editor (Medical):   [name] Lack of Transportation (Non-Medical):   Physical Activity:   . Days of Exercise per Week:   . Minutes of Exercise per Session:   Stress:   . Feeling of Stress :   Social Connections:   . Frequency of Communication with Friends and Family:   . Frequency of Social Gatherings with Friends and Family:   . Attends Religious Services:   . Active Member of Clubs or Organizations:   . Attends Archivist Meetings:   [name] Marital Status:       Review of Systems currently denies fever, headache, chest pain, dyspnea, abdominal/back pain, nausea, vomiting or bleeding.  She does have occasional cough.  Vital Signs: BP (!) 146/89   Pulse 62   Temp 99.5 F (37.5 C) (Oral)   Resp 17   SpO2 100%   Physical Exam awake, alert.  Chest clear to auscultation bilaterally.  Left anterior chest wall ICD in place.  Heart with regular rate and rhythm.  Abdomen soft, positive bowel sounds, nontender.  No lower extremity  edema.  Imaging: DG Chest 2 View  Result Date: 04/15/2020 CLINICAL DATA:  Syncope. EXAM: CHEST - 2 VIEW COMPARISON:  CT chest dated April 13, 2020. Chest x-ray dated May 27, 2016. FINDINGS: Unchanged left chest wall pacemaker. Stable mild cardiomegaly. Normal pulmonary vascularity. No focal consolidation, pleural effusion, or pneumothorax. No acute osseous abnormality. Old right-sided rib fractures. IMPRESSION: No active cardiopulmonary disease. Electronically Signed   By: Titus Dubin M.D.   On: 04/15/2020 20:19   CT HEAD WO CONTRAST  Result Date: 04/15/2020 CLINICAL DATA:  79 year old female with seizure. No trauma. EXAM: CT HEAD WITHOUT CONTRAST CT CERVICAL SPINE WITHOUT CONTRAST TECHNIQUE: Multidetector CT imaging of the head and cervical spine was performed following the standard protocol without intravenous contrast. Multiplanar CT image reconstructions of the cervical spine were also generated. COMPARISON:  Head CT dated 06/20/2012. FINDINGS: CT HEAD FINDINGS Brain: Mild age-related atrophy and chronic microvascular ischemic changes. There is no acute intracranial hemorrhage. No mass effect or midline shift. No extra-axial fluid collection. There is expanded empty sella. Vascular: No hyperdense vessel or unexpected calcification. Skull: Normal. Negative for fracture or focal lesion. Sinuses/Orbits: No acute finding. Other: None CT CERVICAL SPINE FINDINGS Alignment: No acute subluxation. Skull base and vertebrae: No acute fracture. Soft tissues and spinal canal: No prevertebral fluid or swelling. No visible canal hematoma. Disc levels:  No acute findings. Upper chest: Negative. Other: Bilateral carotid bulb calcified plaques. IMPRESSION: 1. No acute intracranial pathology. Mild age-related atrophy and chronic microvascular ischemic changes. 2. No acute/traumatic cervical spine pathology. Electronically Signed   By: Anner Crete M.D.   On: 04/15/2020 19:50   CT Chest W Contrast  Result Date:  04/13/2020 CLINICAL DATA:  Breast cancer staging, new diagnosis breast cancer with multiple metastatic lymph nodes by report. EXAM: CT CHEST, ABDOMEN, AND PELVIS WITH CONTRAST TECHNIQUE: Multidetector CT imaging of the chest, abdomen and pelvis was performed following the standard protocol during bolus administration  of intravenous contrast. CONTRAST:  174m OMNIPAQUE IOHEXOL 300 MG/ML  SOLN COMPARISON:  Chest x-ray from 2017, no cross-sectional comparison imaging. FINDINGS: CT CHEST FINDINGS Cardiovascular: LEFT-sided power pack for single lead cardiac pacer defibrillator. Moderate calcified and noncalcified atherosclerotic plaque in the thoracic aorta. No sign of aneurysm. Heart size is normal without pericardial effusion. Central pulmonary vasculature is unremarkable. Mediastinum/Nodes: LEFT-sided power pack causes streak artifact over the LEFT axilla and supraclavicular regions. LEFT axillary lymphadenopathy is partially visualized. Lymph nodes measuring up to 1.8 cm short axis at the edge of the field of view. No visible subpectoral adenopathy. No internal mammary adenopathy. No mediastinal or hilar adenopathy. Lungs/Pleura: Subtle ground-glass nodule at the RIGHT lung base 7 x 7 mm (image 87, series 6) no additional pulmonary nodules. Airways are patent. Musculoskeletal: Skin thickening over the LEFT breast in the setting of LEFT breast cancer, partially visualized. CT ABDOMEN PELVIS FINDINGS Hepatobiliary: No focal, suspicious hepatic lesion. No Peri cholecystic stranding. Pancreas: Pancreas is normal without focal lesion, ductal dilation or inflammation. Spleen: Spleen is normal size without focal lesion. Adrenals/Urinary Tract: Adrenal glands are normal. Low-density lesion along the upper pole the LEFT kidney measures 32 Hounsfield units post-contrast, is well-circumscribed at 2.0 x 1.9 cm. No hydronephrosis. Scarring of the anterior hilar lip of the LEFT kidney. Stomach/Bowel: No acute gastrointestinal  process. Vascular/Lymphatic: Calcified and noncalcified atheromatous plaque of the abdominal aorta. No evidence of aneurysm. Maximal caliber approximately 2.6 x 2.5 cm. Atheromatous changes extend into the iliac vasculature. Occlusion of LEFT superficial femoral artery just beyond the bifurcation. Profundus femoral is patent. Occlusion likely chronic, no perivascular stranding. Reproductive: Post hysterectomy.  Urinary bladder unremarkable. Other: No hernia. No ascites. Descent of pelvic floor structures suggest pelvic floor dysfunction. Musculoskeletal: No acute bone finding. No destructive bone process. Spinal degenerative changes. Osteopenia. Signs of subacute and chronic rib fractures along the RIGHT chest multiple sites. IMPRESSION: 1. LEFT axillary lymphadenopathy and skin thickening and nodularity about the LEFT breast is partially visualized.w no current signs of distant disease. 2. Subtle ground-glass nodule at the RIGHT lung base, nonspecific. Suggest attention on subsequent imaging, follow-up in 3-6 months may be helpful. 3. 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 No perivascular stranding. The finding likely chronic in the setting of marked vascular disease correlate with any symptoms. 4. Abdominal aortic dilation to 2.5/2.6 cm. Ectatic abdominal aorta at risk for aneurysm development. Recommend followup by ultrasound in 5 years. This recommendation follows ACR consensus guidelines: White Paper of the ACR Incidental Findings Committee II on Vascular Findings. J Am Coll Radiol 2013; 10:789-794. Aortic aneurysm NOS (ICD10-I71.9) 5. Low-density lesion arising from the upper pole the LEFT kidney likely a cyst but with density values slightly greater than 30 Hounsfield units postcontrast. Consider renal sonography or follow-up renal protocol CT for further assessment. 6. Signs of subacute and chronic rib fractures along the RIGHT chest multiple sites. 7. Aortic  atherosclerosis. These results will be called to the ordering clinician or representative by the Radiologist Assistant, and communication documented in the PACS or [employer] Aortic Atherosclerosis (ICD10-I70.0). Electronically Signed   By: GZetta BillsM.D.   On: 04/13/2020 08:51   CT CERVICAL SPINE WO CONTRAST  Result Date: 04/15/2020 CLINICAL DATA:  80year old female with seizure. No trauma. EXAM: CT HEAD WITHOUT CONTRAST CT CERVICAL SPINE WITHOUT CONTRAST TECHNIQUE: Multidetector CT imaging of the head and cervical spine was performed following the standard protocol without intravenous contrast. Multiplanar CT image reconstructions of the  cervical spine were also generated. COMPARISON:  Head CT dated 06/20/2012. FINDINGS: CT HEAD FINDINGS Brain: Mild age-related atrophy and chronic microvascular ischemic changes. There is no acute intracranial hemorrhage. No mass effect or midline shift. No extra-axial fluid collection. There is expanded empty sella. Vascular: No hyperdense vessel or unexpected calcification. Skull: Normal. Negative for fracture or focal lesion. Sinuses/Orbits: No acute finding. Other: None CT CERVICAL SPINE FINDINGS Alignment: No acute subluxation. Skull base and vertebrae: No acute fracture. Soft tissues and spinal canal: No prevertebral fluid or swelling. No visible canal hematoma. Disc levels:  No acute findings. Upper chest: Negative. Other: Bilateral carotid bulb calcified plaques. IMPRESSION: 1. No acute intracranial pathology. Mild age-related atrophy and chronic microvascular ischemic changes. 2. No acute/traumatic cervical spine pathology. Electronically Signed   By: Anner Crete M.D.   On: 04/15/2020 19:50   NM Bone Scan Whole Body  Result Date: 04/13/2020 CLINICAL DATA:  79 year old female with inflammatory breast cancer. Metastatic lymph nodes. Staging. EXAM: NUCLEAR MEDICINE WHOLE BODY BONE SCAN TECHNIQUE: Whole body anterior and posterior images were obtained  approximately 3 hours after intravenous injection of radiopharmaceutical. RADIOPHARMACEUTICALS:  19.8 mCi Technetium-90mMDP IV COMPARISON:  CT Chest, Abdomen, and Pelvis today are reported separately. FINDINGS: Multiple anterior healing right rib fractures noted on the comparison, which correspond to multiple round foci of anterior right rib radiotracer activity on this exam. Abnormal left breast soft tissue uptake compatible with inflammatory breast cancer. Expected radiotracer activity in both kidneys and the urinary bladder. Outside of the ribs homogeneous radiotracer activity throughout the axial skeleton including the skull and pelvis. Visible appendicular skeleton negative aside from mild to moderate degenerative appearing activity including at the left wrist, both knees, ankles. IMPRESSION: 1.  No findings specific for metastatic disease to bone. 2. 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 3. Benign appearing right anterior rib fractures. Scattered degenerative radiotracer activity in the extremities. Electronically Signed   By: HGenevie AnnM.D.   On: 04/13/2020 10:52   CT Abdomen Pelvis W Contrast  Result Date: 04/13/2020 CLINICAL DATA:  Breast cancer staging, new diagnosis breast cancer with multiple metastatic lymph nodes by report. EXAM: CT CHEST, ABDOMEN, AND PELVIS WITH CONTRAST TECHNIQUE: Multidetector CT imaging of the chest, abdomen and pelvis was performed following the standard protocol during bolus administration of intravenous contrast. CONTRAST:  1033mOMNIPAQUE IOHEXOL 300 MG/ML  SOLN COMPARISON:  Chest x-ray from 2017, no cross-sectional comparison imaging. FINDINGS: CT CHEST FINDINGS Cardiovascular: LEFT-sided power pack for single lead cardiac pacer defibrillator. Moderate calcified and noncalcified atherosclerotic plaque in the thoracic aorta. No sign of aneurysm. Heart size is normal without pericardial effusion. Central pulmonary vasculature is  unremarkable. Mediastinum/Nodes: LEFT-sided power pack causes streak artifact over the LEFT axilla and supraclavicular regions. LEFT axillary lymphadenopathy is partially visualized. Lymph nodes measuring up to 1.8 cm short axis at the edge of the field of view. No visible subpectoral adenopathy. No internal mammary adenopathy. No mediastinal or hilar adenopathy. Lungs/Pleura: Subtle ground-glass nodule at the RIGHT lung base 7 x 7 mm (image 87, series 6) no additional pulmonary nodules. Airways are patent. Musculoskeletal: Skin thickening over the LEFT breast in the setting of LEFT breast cancer, partially visualized. CT ABDOMEN PELVIS FINDINGS Hepatobiliary: No focal, suspicious hepatic lesion. No Peri cholecystic stranding. Pancreas: Pancreas is normal without focal lesion, ductal dilation or inflammation. Spleen: Spleen is normal size without focal lesion. Adrenals/Urinary Tract: Adrenal glands are normal. Low-density lesion along the upper pole the  LEFT kidney measures 32 Hounsfield units post-contrast, is well-circumscribed at 2.0 x 1.9 cm. No hydronephrosis. Scarring of the anterior hilar lip of the LEFT kidney. Stomach/Bowel: No acute gastrointestinal process. Vascular/Lymphatic: Calcified and noncalcified atheromatous plaque of the abdominal aorta. No evidence of aneurysm. Maximal caliber approximately 2.6 x 2.5 cm. Atheromatous changes extend into the iliac vasculature. Occlusion of LEFT superficial femoral artery just beyond the bifurcation. Profundus femoral is patent. Occlusion likely chronic, no perivascular stranding. Reproductive: Post hysterectomy.  Urinary bladder unremarkable. Other: No hernia. No ascites. Descent of pelvic floor structures suggest pelvic floor dysfunction. Musculoskeletal: No acute bone finding. No destructive bone process. Spinal degenerative changes. Osteopenia. Signs of subacute and chronic rib fractures along the RIGHT chest multiple sites. IMPRESSION: 1. LEFT axillary  lymphadenopathy and skin thickening and nodularity about the LEFT breast is partially visualized.w no current signs of distant disease. 2. Subtle ground-glass nodule at the RIGHT lung base, nonspecific. Suggest attention on subsequent imaging, follow-up in 3-6 months may be helpful. 3. 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 No perivascular stranding. The finding likely chronic in the setting of marked vascular disease correlate with any symptoms. 4. Abdominal aortic dilation to 2.5/2.6 cm. Ectatic abdominal aorta at risk for aneurysm development. Recommend followup by ultrasound in 5 years. This recommendation follows ACR consensus guidelines: White Paper of the ACR Incidental Findings Committee II on Vascular Findings. J Am Coll Radiol 2013; 10:789-794. Aortic aneurysm NOS (ICD10-I71.9) 5. Low-density lesion arising from the upper pole the LEFT kidney likely a cyst but with density values slightly greater than 30 Hounsfield units postcontrast. Consider renal sonography or follow-up renal protocol CT for further assessment. 6. Signs of subacute and chronic rib fractures along the RIGHT chest multiple sites. 7. Aortic atherosclerosis. These results will be called to the ordering clinician or representative by the Radiologist Assistant, and communication documented in the PACS or [employer]. Aortic Atherosclerosis (ICD10-I70.0). Electronically Signed   By: Zetta Bills M.D.   On: 04/13/2020 08:51   [place] AXILLARY NODE CORE BIOPSY LEFT  Addendum Date: 04/01/2020   ADDENDUM REPORT: 04/01/2020 13:33 ADDENDUM: Pathology revealed GRADE II INVASIVE DUCTAL CARCINOMA. LYMPHOVASCULAR INVASION PRESENT of the LEFT breast, 2 o'clock. This was found to be concordant by Dr. Lajean Manes. 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 This was found to be concordant by Dr. Lajean Manes. Pathology results were discussed with the patient by  telephone. The patient reported doing well after the biopsies with tenderness at the sites. Post biopsy instructions and care were reviewed and questions were answered. The patient was encouraged to call [facility] for any additional concerns. Surgical consultation has been arranged with Dr. Erroll Luna at [facility] on Apr 05, 2020. Pathology results reported by Stacie Acres RN on 04/01/2020. Electronically Signed   By: Lajean Manes M.D.   On: 04/01/2020 13:33   Result Date: 04/01/2020 CLINICAL DATA:  Patient presents for ultrasound-guided core needle biopsy of masses in the upper outer left breast, centered at 2 o'clock, and 1 of the several abnormal left axillary lymph nodes. 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 EXAM: ULTRASOUND GUIDED LEFT BREAST CORE NEEDLE BIOPSY ULTRASOUND GUIDED LEFT AXILLARY LYMPH NODE CORE NEEDLE BIOPSY COMPARISON:  Previous exam(s). PROCEDURE: I met with the patient and we discussed the procedure of ultrasound-guided biopsy, including benefits and alternatives. We discussed the high likelihood of a successful  procedure. We discussed the risks of the procedure, including infection, bleeding, tissue injury, clip migration, and inadequate sampling. Informed written consent was given. The usual time-out protocol was performed immediately prior to the procedure. Lesion #1: Left breast mass. Lesion quadrant: Upper outer quadrant Using sterile technique and 1% Lidocaine as local anesthetic, under direct ultrasound visualization, a 12 gauge spring-loaded device was used to perform biopsy of masses at the 2 o'clock position of the left breast using an inferior approach. At the conclusion of the procedure ribbon shaped tissue marker clip was deployed into the biopsy cavity. Lesion #2: Left axillary lymph node. Lesion location: Inferior left axilla. Using sterile technique and 1% Lidocaine as local anesthetic, under  direct ultrasound visualization, a 14 gauge spring-loaded device was used to perform biopsy of 1 of the abnormal left axillary lymph nodes using an inferior/lateral approach. At the conclusion of the procedure Q tissue marker clip was deployed into the biopsy cavity. Follow up 2 view mammogram was performed and dictated separately. IMPRESSION: Ultrasound guided biopsy of the left breast and an abnormal left axillary lymph node. No apparent complications. Electronically Signed: By: Lajean Manes M.D. On: 03/31/2020 13:45   MM CLIP PLACEMENT LEFT  Result Date: 03/31/2020 CLINICAL DATA:  Evaluate marker clip placement following ultrasound-guided core needle biopsy of a left breast mass and left axillary lymph node. EXAM: DIAGNOSTIC LEFT MAMMOGRAM POST ULTRASOUND BIOPSY COMPARISON:  Prior exams FINDINGS: Mammographic images were obtained following ultrasound guided biopsy of an upper outer quadrant left breast mass and an abnormal left axillary lymph node. The ribbon shaped biopsy clip lies along the posterior margin of the upper-outer quadrant masses and the Q shaped biopsy clip, partly imaged, lies in the left axilla along the anterior margin of an abnormal lymph node. IMPRESSION: Appropriate positioning of the ribbon and Q shaped biopsy marking clips at the site of biopsy in the in the upper outer left breast and left axilla. Final Assessment: Post Procedure Mammograms for Marker Placement Electronically Signed   By: Lajean Manes M.D.   On: 03/31/2020 14:08   [place] LT BREAST BX W LOC DEV 1ST LESION IMG BX SPEC US GUIDE  Addendum Date: 04/01/2020   ADDENDUM REPORT: 04/01/2020 13:33 ADDENDUM: Pathology revealed GRADE II INVASIVE DUCTAL CARCINOMA. LYMPHOVASCULAR INVASION PRESENT of the LEFT breast, 2 o'clock. This was found to be concordant by Dr. Lajean Manes. 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 This was found to be concordant by Dr. Lajean Manes.  Pathology results were discussed with the patient by telephone. The patient reported doing well after the biopsies with tenderness at the sites. Post biopsy instructions and care were reviewed and questions were answered. The patient was encouraged to call [facility] for any additional concerns. Surgical consultation has been arranged with Dr. Erroll Luna at [facility] on Apr 05, 2020. Pathology results reported by Stacie Acres RN on 04/01/2020. Electronically Signed   By: Lajean Manes M.D.   On: 04/01/2020 13:33   Result Date: 04/01/2020 CLINICAL DATA:  Patient presents for ultrasound-guided core needle biopsy of masses in the upper outer left breast, centered at 2 o'clock, and 1 of the several abnormal left axillary lymph nodes. 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 EXAM: ULTRASOUND GUIDED LEFT BREAST CORE NEEDLE BIOPSY ULTRASOUND GUIDED LEFT AXILLARY LYMPH NODE CORE NEEDLE BIOPSY COMPARISON:  Previous exam(s). PROCEDURE: I met with the patient and we discussed  the procedure of ultrasound-guided biopsy, including benefits and alternatives. We discussed the high likelihood of a successful procedure. We discussed the risks of the procedure, including infection, bleeding, tissue injury, clip migration, and inadequate sampling. Informed written consent was given. The usual time-out protocol was performed immediately prior to the procedure. Lesion #1: Left breast mass. Lesion quadrant: Upper outer quadrant Using sterile technique and 1% Lidocaine as local anesthetic, under direct ultrasound visualization, a 12 gauge spring-loaded device was used to perform biopsy of masses at the 2 o'clock position of the left breast using an inferior approach. At the conclusion of the procedure ribbon shaped tissue marker clip was deployed into the biopsy cavity. Lesion #2: Left axillary lymph node. Lesion location: Inferior left axilla. Using sterile  technique and 1% Lidocaine as local anesthetic, under direct ultrasound visualization, a 14 gauge spring-loaded device was used to perform biopsy of 1 of the abnormal left axillary lymph nodes using an inferior/lateral approach. At the conclusion of the procedure Q tissue marker clip was deployed into the biopsy cavity. Follow up 2 view mammogram was performed and dictated separately. IMPRESSION: Ultrasound guided biopsy of the left breast and an abnormal left axillary lymph node. No apparent complications. Electronically Signed: By: Lajean Manes M.D. On: 03/31/2020 13:45    Labs:  CBC: Recent Labs    02/04/20 1445 04/15/20 1903 04/19/20 1125 04/26/20 1240  WBC 5.6 9.4 6.8 4.6  HGB 13.9 13.9 13.1 13.5  HCT 41.6 41.8 39.7 39.8  PLT 264 228 230 242    COAGS: Recent Labs    04/15/20 1903  INR 1.0    BMP: Recent Labs    02/04/20 1445 02/04/20 1445 04/13/20 0754 04/15/20 1903 04/19/20 1125 04/26/20 1240  NA 134*  --   --  136 134* 136  K 4.1  --   --  4.1 3.6 4.2  CL 102  --   --  105 103 104  CO2 21*  --   --  23 21* 22  GLUCOSE 184*  --   --  173* 261* 200*  BUN 15  --   --  _0 CALCIUM 9.3  --   --  8.8* 9.5 9.5  CREATININE 0.76   < > 0.90 0.77 0.99 0.99  GFRNONAA >60  --   --  >60 55* 55*  GFRAA >60  --   --  >60 >60 >60   < > = values in this interval not displayed.    LIVER FUNCTION TESTS: Recent Labs    04/15/20 1903 04/19/20 1125 04/26/20 1240  BILITOT 0.5 0.6 0.5  AST _1 ALT _2 ALKPHOS 83 91 90  PROT 6.3* 6.5 7.0  ALBUMIN 3.7 3.4* 3.7    TUMOR MARKERS: No results for input(s): AFPTM, CEA, CA199, CHROMGRNA in the last 8760 hours.  Assessment and Plan: 79 y.o. female with history of recently diagnosed left breast carcinoma who presents today for Port-A-Cath placement for chemotherapy.Risks and benefits of image guided port-a-catheter placement was discussed with the patient including, but not limited to bleeding, infection,  pneumothorax, or fibrin sheath development and need for additional procedures.  All of the patient's questions were answered, patient is agreeable to proceed. Consent signed and in chart.     Thank you for this interesting consult.  I greatly enjoyed meeting Colleen Lowery and look forward to participating in their care.  A copy of this report was sent to the requesting provider  on this date.  Electronically Signed: D. Rowe Robert, PA-C 04/27/2020, 1:28 PM   I spent a total of 25 minutes   in face to face in clinical consultation, greater than 50% of which was counseling/coordinating care for Port-A-Cath placement

## 2020-04-27 NOTE — Telephone Encounter (Signed)
I called [facility] and spoke with Mickel Baas, the nurse who works with Suezanne Jacquet, one of the APPs that sees the patient. I requested that we consider moving her left sided pacemaker as Dr. Lisbeth Renshaw is concerned that it is directly in the field of planned radiotherapy. Mickel Baas will call us back to coordinate, and her number I [phone].

## 2020-04-27 NOTE — Procedures (Signed)
Interventional Radiology Procedure Note  Procedure: Placement of a right IJ approach single lumen PowerPort.  Tip is positioned at the superior cavoatrial junction and catheter is ready for immediate use.  Complications: No immediate Recommendations:  - Ok to shower tomorrow - Do not submerge for 7 days - Routine line care   Signed,  Danzig Macgregor K. Pansey Pinheiro, MD   

## 2020-04-27 NOTE — Discharge Instructions (Signed)
Implanted Port Insertion, Care After This sheet gives you information about how to care for yourself after your procedure. Your health care provider may also give you more specific instructions. If you have problems or questions, contact your health care provider. What can I expect after the procedure? After the procedure, it is common to have:  Discomfort at the port insertion site.  Bruising on the skin over the port. This should improve over 3-4 days. Follow these instructions at home: [facility] care  After your port is placed, you will get a manufacturer's information card. The card has information about your port. Keep this card with you at all times.  Take care of the port as told by your health care provider. Ask your health care provider if you or a family member can get training for taking care of the port at home. A home health care nurse may also take care of the port.  Make sure to remember what type of port you have. Incision care      Follow instructions from your health care provider about how to take care of your port insertion site. Make sure you: ? Wash your hands with soap and water before and after you change your bandage (dressing). If soap and water are not available, use hand sanitizer. ? Change your dressing as told by your health care provider. ? You may remove dressing on 04/28/20 and shower 04/28/20 afternoon.  Check your port insertion site every day for signs of infection. Check for: ? Redness, swelling, or pain. ? Fluid or blood. ? Warmth. ? Pus or a bad smell. Activity  Return to your normal activities as told by your health care provider. Ask your health care provider what activities are safe for you.  Do not lift anything that is heavier than 10 lb (4.5 kg), or the limit that you are told, until your health care provider says that it is safe. General instructions  Take over-the-counter and prescription medicines only as told by your health care  provider.  Do not take baths, swim, or use a hot tub until your health care provider approves.  You may shower tomorrow afternoon.  Do not drive for 24 hours if you were given a sedative during your procedure.  Wear a medical alert bracelet in case of an emergency. This will tell any health care providers that you have a port.  Keep all follow-up visits as told by your health care provider. This is important. Contact a health care provider if:  You cannot flush your port with saline as directed, or you cannot draw blood from the port.  You have a fever or chills.  You have redness, swelling, or pain around your port insertion site.  You have fluid or blood coming from your port insertion site.  Your port insertion site feels warm to the touch.  You have pus or a bad smell coming from the port insertion site. Get help right away if:  You have chest pain or shortness of breath.  You have bleeding from your port that you cannot control. Summary  Take care of the port as told by your health care provider. Keep the manufacturer's information card with you at all times.  Change your dressing as told by your health care provider.  Contact a health care provider if you have a fever or chills or if you have redness, swelling, or pain around your port insertion site.  Keep all follow-up visits as told by your  health care provider. This information is not intended to replace advice given to you by your health care provider. Make sure you discuss any questions you have with your health care provider. Document Revised: 05/28/2018 Document Reviewed: 05/28/2018 Elsevier Patient Education  [place].    Moderate Conscious Sedation, Adult, Care After These instructions provide you with information about caring for yourself after your procedure. Your health care provider may also give you more specific instructions. Your treatment has been planned according to current medical  practices, but problems sometimes occur. Call your health care provider if you have any problems or questions after your procedure. What can I expect after the procedure? After your procedure, it is common:  To feel sleepy for several hours.  To feel clumsy and have poor balance for several hours.  To have poor judgment for several hours.  To vomit if you eat too soon. Follow these instructions at home: For at least 24 hours after the procedure:   Do not: ? Participate in activities where you could fall or become injured. ? Drive. ? Use heavy machinery. ? Drink alcohol. ? Take sleeping pills or medicines that cause drowsiness. ? Make important decisions or sign legal documents. ? Take care of children on your own.  Rest. Eating and drinking  Follow the diet recommended by your health care provider.  If you vomit: ? Drink water, juice, or soup when you can drink without vomiting. ? Make sure you have little or no nausea before eating solid foods. General instructions  Have a responsible adult stay with you until you are awake and alert.  Take over-the-counter and prescription medicines only as told by your health care provider.  If you smoke, do not smoke without supervision.  Keep all follow-up visits as told by your health care provider. This is important. Contact a health care provider if:  You keep feeling nauseous or you keep vomiting.  You feel light-headed.  You develop a rash.  You have a fever. Get help right away if:  You have trouble breathing. This information is not intended to replace advice given to you by your health care provider. Make sure you discuss any questions you have with your health care provider. Document Revised: 10/12/2017 Document Reviewed: 02/19/2016 Elsevier Patient Education  2020 [employer].

## 2020-05-03 ENCOUNTER — Other Ambulatory Visit: Payer: Self-pay | Admitting: Radiation Oncology

## 2020-05-03 DIAGNOSIS — Z170 Estrogen receptor positive status [ER+]: Secondary | ICD-10-CM

## 2020-05-03 DIAGNOSIS — C50412 Malignant neoplasm of upper-outer quadrant of left female breast: Secondary | ICD-10-CM
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
## 2020-05-10 ENCOUNTER — Inpatient Hospital Stay: Payer: Medicare HMO

## 2020-05-10 ENCOUNTER — Encounter: Payer: Self-pay | Admitting: *Deleted

## 2020-05-10 ENCOUNTER — Inpatient Hospital Stay: Payer: Medicare HMO | Admitting: Hematology and Oncology

## 2020-05-10 ENCOUNTER — Other Ambulatory Visit: Payer: Self-pay

## 2020-05-10 DIAGNOSIS — Z170 Estrogen receptor positive status [ER+]: Secondary | ICD-10-CM

## 2020-05-10 DIAGNOSIS — C50412 Malignant neoplasm of upper-outer quadrant of left female breast: Secondary | ICD-10-CM | POA: Diagnosis not present

## 2020-05-10 DIAGNOSIS — Z5111 Encounter for antineoplastic chemotherapy: Secondary | ICD-10-CM | POA: Diagnosis not present

## 2020-05-10 LAB — CMP (CANCER CENTER ONLY)
ALT: 13 U/L (ref 0–44)
AST: 19 U/L (ref 15–41)
Albumin: 3.7 g/dL (ref 3.5–5.0)
Alkaline Phosphatase: 110 U/L (ref 38–126)
Anion gap: 13 (ref 5–15)
BUN: 12 mg/dL (ref 8–23)
CO2: 16 mmol/L — ABNORMAL LOW (ref 22–32)
Calcium: 9.4 mg/dL (ref 8.9–10.3)
Chloride: 107 mmol/L (ref 98–111)
Creatinine: 1.05 mg/dL — ABNORMAL HIGH (ref 0.44–1.00)
GFR, Est AFR Am: 60 mL/min — ABNORMAL LOW (ref >60)
GFR, Estimated: 52 mL/min — ABNORMAL LOW (ref >60)
Glucose, Bld: 246 mg/dL — ABNORMAL HIGH (ref 70–99)
Potassium: 4 mmol/L (ref 3.5–5.1)
Sodium: 136 mmol/L (ref 135–145)
Total Bilirubin: 0.5 mg/dL (ref 0.3–1.2)
Total Protein: 6.8 g/dL (ref 6.5–8.1)

## 2020-05-10 LAB — CBC WITH DIFFERENTIAL (CANCER CENTER ONLY)
Abs Immature Granulocytes: 0.04 10*3/uL (ref 0.00–0.07)
Basophils Absolute: 0.1 10*3/uL (ref 0.0–0.1)
Basophils Relative: 1 %
Eosinophils Absolute: 0.1 10*3/uL (ref 0.0–0.5)
Eosinophils Relative: 2 %
HCT: 39.6 % (ref 36.0–46.0)
Hemoglobin: 13.3 g/dL (ref 12.0–15.0)
Immature Granulocytes: 1 %
Lymphocytes Relative: 28 %
Lymphs Abs: 1.6 10*3/uL (ref 0.7–4.0)
MCH: 31 pg (ref 26.0–34.0)
MCHC: 33.6 g/dL (ref 30.0–36.0)
MCV: 92.3 fL (ref 80.0–100.0)
Monocytes Absolute: 1 10*3/uL (ref 0.1–1.0)
Monocytes Relative: 18 %
Neutro Abs: 2.9 10*3/uL (ref 1.7–7.7)
Neutrophils Relative %: 50 %
Platelet Count: 197 10*3/uL (ref 150–400)
RBC: 4.29 MIL/uL (ref 3.87–5.11)
RDW: 12.9 % (ref 11.5–15.5)
WBC Count: 5.6 10*3/uL (ref 4.0–10.5)
nRBC: 0 % (ref 0.0–0.2)

## 2020-05-10 MED ORDER — SODIUM CHLORIDE 0.9 % IV SOLN
Freq: Once | INTRAVENOUS | Status: AC
  Filled 2020-05-10: qty 250

## 2020-05-10 MED ORDER — FLUOROURACIL CHEMO INJECTION 2.5 GM/50ML
500.0000 mg/m2 | Freq: Once | INTRAVENOUS | Status: AC
  Administered 2020-05-10: 850 mg via INTRAVENOUS
  Filled 2020-05-10: qty 17

## 2020-05-10 MED ORDER — SODIUM CHLORIDE 0.9 % IV SOLN
500.0000 mg/m2 | Freq: Once | INTRAVENOUS | Status: AC
  Administered 2020-05-10: 840 mg via INTRAVENOUS
  Filled 2020-05-10: qty 42

## 2020-05-10 MED ORDER — SODIUM CHLORIDE 0.9 % IV SOLN
10.0000 mg | Freq: Once | INTRAVENOUS | Status: AC
  Administered 2020-05-10: 10 mg via INTRAVENOUS
  Filled 2020-05-10: qty 10

## 2020-05-10 MED ORDER — PALONOSETRON HCL INJECTION 0.25 MG/5ML
INTRAVENOUS | Status: AC
  Filled 2020-05-10: qty 5

## 2020-05-10 MED ORDER — HEPARIN SOD (PORK) LOCK FLUSH 100 UNIT/ML IV SOLN
500.0000 [IU] | Freq: Once | INTRAVENOUS | Status: AC | PRN
  Administered 2020-05-10: 500 [IU]
  Filled 2020-05-10: qty 5

## 2020-05-10 MED ORDER — SODIUM CHLORIDE 0.9% FLUSH
10.0000 mL | INTRAVENOUS | Status: DC | PRN
  Administered 2020-05-10: 10 mL
  Filled 2020-05-10: qty 10

## 2020-05-10 MED ORDER — METHOTREXATE SODIUM (PF) CHEMO INJECTION 250 MG/10ML
29.7500 mg/m2 | Freq: Once | INTRAMUSCULAR | Status: AC
  Administered 2020-05-10: 50 mg via INTRAVENOUS
  Filled 2020-05-10: qty 2

## 2020-05-10 MED ORDER — PALONOSETRON HCL INJECTION 0.25 MG/5ML
0.2500 mg | Freq: Once | INTRAVENOUS | Status: AC
  Administered 2020-05-10: 0.25 mg via INTRAVENOUS

## 2020-05-10 NOTE — Patient Instructions (Signed)
[facility] Discharge Instructions for Patients Receiving Chemotherapy  Today you received the following chemotherapy agents Cytoxan, Methotrexate, 5FU.  To help prevent nausea and vomiting after your treatment, we encourage you to take your nausea medication DO NOT TAKE ZOFRAN FOR THREE DAYS AFTER TREATMENT.   If you develop nausea and vomiting that is not controlled by your nausea medication, call the clinic.   BELOW ARE SYMPTOMS THAT SHOULD BE REPORTED IMMEDIATELY:  *FEVER GREATER THAN 100.5 F  *CHILLS WITH OR WITHOUT FEVER  NAUSEA AND VOMITING THAT IS NOT CONTROLLED WITH YOUR NAUSEA MEDICATION  *UNUSUAL SHORTNESS OF BREATH  *UNUSUAL BRUISING OR BLEEDING  TENDERNESS IN MOUTH AND THROAT WITH OR WITHOUT PRESENCE OF ULCERS  *URINARY PROBLEMS  *BOWEL PROBLEMS  UNUSUAL RASH Items with * indicate a potential emergency and should be followed up as soon as possible.  Feel free to call the clinic should you have any questions or concerns. The clinic phone number is [phone].  Please show the [facility] at check-in to the Emergency Department and triage nurse.  Fluorouracil, 5-FU injection What is this medicine? FLUOROURACIL, 5-FU (flure oh YOOR a sil) is a chemotherapy drug. It slows the growth of cancer cells. This medicine is used to treat many types of cancer like breast cancer, colon or rectal cancer, pancreatic cancer, and stomach cancer. This medicine may be used for other purposes; ask your health care provider or pharmacist if you have questions. COMMON BRAND NAME(S): Adrucil What should I tell my health care provider before I take this medicine? They need to know if you have any of these conditions:  blood disorders  dihydropyrimidine dehydrogenase (DPD) deficiency  infection (especially a virus infection such as chickenpox, cold sores, or herpes)  kidney disease  liver disease  malnourished, poor nutrition  recent or ongoing  radiation therapy  an unusual or allergic reaction to fluorouracil, other chemotherapy, other medicines, foods, dyes, or preservatives  pregnant or trying to get pregnant  breast-feeding How should I use this medicine? This drug is given as an infusion or injection into a vein. It is administered in a hospital or clinic by a specially trained health care professional. Talk to your pediatrician regarding the use of this medicine in children. Special care may be needed. Overdosage: If you think you have taken too much of this medicine contact a poison control center or emergency room at once. NOTE: This medicine is only for you. Do not share this medicine with others. What if I miss a dose? It is important not to miss your dose. Call your doctor or health care professional if you are unable to keep an appointment. What may interact with this medicine?  allopurinol  cimetidine  dapsone  digoxin  hydroxyurea  leucovorin  levamisole  medicines for seizures like ethotoin, fosphenytoin, phenytoin  medicines to increase blood counts like filgrastim, pegfilgrastim, sargramostim  medicines that treat or prevent blood clots like warfarin, enoxaparin, and dalteparin  methotrexate  metronidazole  pyrimethamine  some other chemotherapy drugs like busulfan, cisplatin, estramustine, vinblastine  trimethoprim  trimetrexate  vaccines Talk to your doctor or health care professional before taking any of these medicines:  acetaminophen  aspirin  ibuprofen  ketoprofen  naproxen This list may not describe all possible interactions. Give your health care provider a list of all the medicines, herbs, non-prescription drugs, or dietary supplements you use. Also tell them if you smoke, drink alcohol, or use illegal drugs. Some items may interact with your  medicine. What should I watch for while using this medicine? Visit your doctor for checks on your progress. This drug may make  you feel generally unwell. This is not uncommon, as chemotherapy can affect healthy cells as well as cancer cells. Report any side effects. Continue your course of treatment even though you feel ill unless your doctor tells you to stop. In some cases, you may be given additional medicines to help with side effects. Follow all directions for their use. Call your doctor or health care professional for advice if you get a fever, chills or sore throat, or other symptoms of a cold or flu. Do not treat yourself. This drug decreases your body's ability to fight infections. Try to avoid being around people who are sick. This medicine may increase your risk to bruise or bleed. Call your doctor or health care professional if you notice any unusual bleeding. Be careful brushing and flossing your teeth or using a toothpick because you may get an infection or bleed more easily. If you have any dental work done, tell your dentist you are receiving this medicine. Avoid taking products that contain aspirin, acetaminophen, ibuprofen, naproxen, or ketoprofen unless instructed by your doctor. These medicines may hide a fever. Do not become pregnant while taking this medicine. Women should inform their doctor if they wish to become pregnant or think they might be pregnant. There is a potential for serious side effects to an unborn child. Talk to your health care professional or pharmacist for more information. Do not breast-feed an infant while taking this medicine. Men should inform their doctor if they wish to father a child. This medicine may lower sperm counts. Do not treat diarrhea with over the counter products. Contact your doctor if you have diarrhea that lasts more than 2 days or if it is severe and watery. This medicine can make you more sensitive to the sun. Keep out of the sun. If you cannot avoid being in the sun, wear protective clothing and use sunscreen. Do not use sun lamps or tanning beds/booths. What side  effects may I notice from receiving this medicine? Side effects that you should report to your doctor or health care professional as soon as possible:  allergic reactions like skin rash, itching or hives, swelling of the face, lips, or tongue  low blood counts - this medicine may decrease the number of white blood cells, red blood cells and platelets. You may be at increased risk for infections and bleeding.  signs of infection - fever or chills, cough, sore throat, pain or difficulty passing urine  signs of decreased platelets or bleeding - bruising, pinpoint red spots on the skin, black, tarry stools, blood in the urine  signs of decreased red blood cells - unusually weak or tired, fainting spells, lightheadedness  breathing problems  changes in vision  chest pain  mouth sores  nausea and vomiting  pain, swelling, redness at site where injected  pain, tingling, numbness in the hands or feet  redness, swelling, or sores on hands or feet  stomach pain  unusual bleeding Side effects that usually do not require medical attention (report to your doctor or health care professional if they continue or are bothersome):  changes in finger or toe nails  diarrhea  dry or itchy skin  hair loss  headache  loss of appetite  sensitivity of eyes to the light  stomach upset  unusually teary eyes This list may not describe all possible side effects. Call your  doctor for medical advice about side effects. You may report side effects to FDA at 1-800-FDA-1088. Where should I keep my medicine? This drug is given in a hospital or clinic and will not be stored at home. NOTE: This sheet is a summary. It may not cover all possible information. If you have questions about this medicine, talk to your doctor, pharmacist, or health care provider.  2020 Elsevier/Gold Standard (2008-03-04 13:53:16) Methotrexate injection What is this medicine? METHOTREXATE (METH oh TREX ate) is a  chemotherapy drug used to treat cancer including breast cancer, leukemia, and lymphoma. This medicine can also be used to treat psoriasis and certain kinds of arthritis. This medicine may be used for other purposes; ask your health care provider or pharmacist if you have questions. What should I tell my health care provider before I take this medicine? They need to know if you have any of these conditions:  fluid in the stomach area or lungs  if you often drink alcohol  infection or immune system problems  kidney disease  liver disease  low blood counts, like low white cell, platelet, or red cell counts  lung disease  radiation therapy  stomach ulcers  ulcerative colitis  an unusual or allergic reaction to methotrexate, other medicines, foods, dyes, or preservatives  pregnant or trying to get pregnant  breast-feeding How should I use this medicine? This medicine is for infusion into a vein or for injection into muscle or into the spinal fluid (whichever applies). It is usually given by a health care professional in a hospital or clinic setting. In rare cases, you might get this medicine at home. You will be taught how to give this medicine. Use exactly as directed. Take your medicine at regular intervals. Do not take your medicine more often than directed. If this medicine is used for arthritis or psoriasis, it should be taken weekly, NOT daily. It is important that you put your used needles and syringes in a special sharps container. Do not put them in a trash can. If you do not have a sharps container, call your pharmacist or healthcare provider to get one. Talk to your pediatrician regarding the use of this medicine in children. While this drug may be prescribed for children as young as 2 years for selected conditions, precautions do apply. Overdosage: If you think you have taken too much of this medicine contact a poison control center or emergency room at once. NOTE: This  medicine is only for you. Do not share this medicine with others. What if I miss a dose? It is important not to miss your dose. Call your doctor or health care professional if you are unable to keep an appointment. If you give yourself the medicine and you miss a dose, talk with your doctor or health care professional. Do not take double or extra doses. What may interact with this medicine? This medicine may interact with the following medications:  acitretin  aspirin or aspirin-like medicines including salicylates  azathioprine  certain antibiotics like chloramphenicol, penicillin, tetracycline  certain medicines for stomach problems like esomeprazole, omeprazole, pantoprazole  cyclosporine  gold  hydroxychloroquine  live virus vaccines  mercaptopurine  NSAIDs, medicines for pain and inflammation, like ibuprofen or naproxen  other cytotoxic agents  penicillamine  phenylbutazone  phenytoin  probenacid  retinoids such as isotretinoin and tretinoin  steroid medicines like prednisone or cortisone  sulfonamides like sulfasalazine and trimethoprim/sulfamethoxazole  theophylline This list may not describe all possible interactions. Give your health care  provider a list of all the medicines, herbs, non-prescription drugs, or dietary supplements you use. Also tell them if you smoke, drink alcohol, or use illegal drugs. Some items may interact with your medicine. What should I watch for while using this medicine? Avoid alcoholic drinks. In some cases, you may be given additional medicines to help with side effects. Follow all directions for their use. This medicine can make you more sensitive to the sun. Keep out of the sun. If you cannot avoid being in the sun, wear protective clothing and use sunscreen. Do not use sun lamps or tanning beds/booths. You may get drowsy or dizzy. Do not drive, use machinery, or do anything that needs mental alertness until you know how this  medicine affects you. Do not stand or sit up quickly, especially if you are an older patient. This reduces the risk of dizzy or fainting spells. You may need blood work done while you are taking this medicine. Call your doctor or health care professional for advice if you get a fever, chills or sore throat, or other symptoms of a cold or flu. Do not treat yourself. This drug decreases your body's ability to fight infections. Try to avoid being around people who are sick. This medicine may increase your risk to bruise or bleed. Call your doctor or health care professional if you notice any unusual bleeding. Check with your doctor or health care professional if you get an attack of severe diarrhea, nausea and vomiting, or if you sweat a lot. The loss of too much body fluid can make it dangerous for you to take this medicine. Talk to your doctor about your risk of cancer. You may be more at risk for certain types of cancers if you take this medicine. Both men and women must use effective birth control with this medicine. Do not become pregnant while taking this medicine or until at least 1 normal menstrual cycle has occurred after stopping it. Women should inform their doctor if they wish to become pregnant or think they might be pregnant. Men should not father a child while taking this medicine and for 3 months after stopping it. There is a potential for serious side effects to an unborn child. Talk to your health care professional or pharmacist for more information. Do not breast-feed an infant while taking this medicine. What side effects may I notice from receiving this medicine? Side effects that you should report to your doctor or health care professional as soon as possible:  allergic reactions like skin rash, itching or hives, swelling of the face, lips, or tongue  back pain  breathing problems or shortness of breath  confusion  diarrhea  dry, nonproductive cough  low blood counts - this  medicine may decrease the number of white blood cells, red blood cells and platelets. You may be at increased risk of infections and bleeding  mouth sores  redness, blistering, peeling or loosening of the skin, including inside the mouth  seizures  severe headaches  signs of infection - fever or chills, cough, sore throat, pain or difficulty passing urine  signs and symptoms of bleeding such as bloody or black, tarry stools; red or dark-brown urine; spitting up blood or brown material that looks like coffee grounds; red spots on the skin; unusual bruising or bleeding from the eye, gums, or nose  signs and symptoms of kidney injury like trouble passing urine or change in the amount of urine  signs and symptoms of liver injury like  dark yellow or brown urine; general ill feeling or flu-like symptoms; light-colored stools; loss of appetite; nausea; right upper belly pain; unusually weak or tired; yellowing of the eyes or skin  stiff neck  vomiting Side effects that usually do not require medical attention (report to your doctor or health care professional if they continue or are bothersome):  dizziness  hair loss  headache  stomach pain  upset stomach This list may not describe all possible side effects. Call your doctor for medical advice about side effects. You may report side effects to FDA at 1-800-FDA-1088. Where should I keep my medicine? If you are using this medicine at home, you will be instructed on how to store this medicine. Throw away any unused medicine after the expiration date on the label. NOTE: This sheet is a summary. It may not cover all possible information. If you have questions about this medicine, talk to your doctor, pharmacist, or health care provider.  2020 Elsevier/Gold Standard (2017-06-21 13:31:42) Cyclophosphamide Injection What is this medicine? CYCLOPHOSPHAMIDE (sye kloe FOSS fa mide) is a chemotherapy drug. It slows the growth of cancer cells.  This medicine is used to treat many types of cancer like lymphoma, myeloma, leukemia, breast cancer, and ovarian cancer, to name a few. This medicine may be used for other purposes; ask your health care provider or pharmacist if you have questions. COMMON BRAND NAME(S): Cytoxan, Neosar What should I tell my health care provider before I take this medicine? They need to know if you have any of these conditions:  heart disease  history of irregular heartbeat  infection  kidney disease  liver disease  low blood counts, like white cells, platelets, or red blood cells  on hemodialysis  recent or ongoing radiation therapy  scarring or thickening of the lungs  trouble passing urine  an unusual or allergic reaction to cyclophosphamide, other medicines, foods, dyes, or preservatives  pregnant or trying to get pregnant  breast-feeding How should I use this medicine? This drug is usually given as an injection into a vein or muscle or by infusion into a vein. It is administered in a hospital or clinic by a specially trained health care professional. Talk to your pediatrician regarding the use of this medicine in children. Special care may be needed. Overdosage: If you think you have taken too much of this medicine contact a poison control center or emergency room at once. NOTE: This medicine is only for you. Do not share this medicine with others. What if I miss a dose? It is important not to miss your dose. Call your doctor or health care professional if you are unable to keep an appointment. What may interact with this medicine?  amphotericin B  azathioprine  certain antivirals for HIV or hepatitis  certain medicines for blood pressure, heart disease, irregular heart beat  certain medicines that treat or prevent blood clots like warfarin  certain other medicines for cancer  cyclosporine  etanercept  indomethacin  medicines that relax muscles for surgery  medicines to  increase blood counts  metronidazole This list may not describe all possible interactions. Give your health care provider a list of all the medicines, herbs, non-prescription drugs, or dietary supplements you use. Also tell them if you smoke, drink alcohol, or use illegal drugs. Some items may interact with your medicine. What should I watch for while using this medicine? Your condition will be monitored carefully while you are receiving this medicine. You may need blood work done while  you are taking this medicine. Drink water or other fluids as directed. Urinate often, even at night. Some products may contain alcohol. Ask your health care professional if this medicine contains alcohol. Be sure to tell all health care professionals you are taking this medicine. Certain medicines, like metronidazole and disulfiram, can cause an unpleasant reaction when taken with alcohol. The reaction includes flushing, headache, nausea, vomiting, sweating, and increased thirst. The reaction can last from 30 minutes to several hours. Do not become pregnant while taking this medicine or for 1 year after stopping it. Women should inform their health care professional if they wish to become pregnant or think they might be pregnant. Men should not father a child while taking this medicine and for 4 months after stopping it. There is potential for serious side effects to an unborn child. Talk to your health care professional for more information. Do not breast-feed an infant while taking this medicine or for 1 week after stopping it. This medicine has caused ovarian failure in some women. This medicine may make it more difficult to get pregnant. Talk to your health care professional if you are concerned about your fertility. This medicine has caused decreased sperm counts in some men. This may make it more difficult to father a child. Talk to your health care professional if you are concerned about your fertility. Call your  health care professional for advice if you get a fever, chills, or sore throat, or other symptoms of a cold or flu. Do not treat yourself. This medicine decreases your body's ability to fight infections. Try to avoid being around people who are sick. Avoid taking medicines that contain aspirin, acetaminophen, ibuprofen, naproxen, or ketoprofen unless instructed by your health care professional. These medicines may hide a fever. Talk to your health care professional about your risk of cancer. You may be more at risk for certain types of cancer if you take this medicine. If you are going to need surgery or other procedure, tell your health care professional that you are using this medicine. Be careful brushing or flossing your teeth or using a toothpick because you may get an infection or bleed more easily. If you have any dental work done, tell your dentist you are receiving this medicine. What side effects may I notice from receiving this medicine? Side effects that you should report to your doctor or health care professional as soon as possible:  allergic reactions like skin rash, itching or hives, swelling of the face, lips, or tongue  breathing problems  nausea, vomiting  signs and symptoms of bleeding such as bloody or black, tarry stools; red or dark brown urine; spitting up blood or brown material that looks like coffee grounds; red spots on the skin; unusual bruising or bleeding from the eyes, gums, or nose  signs and symptoms of heart failure like fast, irregular heartbeat, sudden weight gain; swelling of the ankles, feet, hands  signs and symptoms of infection like fever; chills; cough; sore throat; pain or trouble passing urine  signs and symptoms of kidney injury like trouble passing urine or change in the amount of urine  signs and symptoms of liver injury like dark yellow or brown urine; general ill feeling or flu-like symptoms; light-colored stools; loss of appetite; nausea; right  upper belly pain; unusually weak or tired; yellowing of the eyes or skin Side effects that usually do not require medical attention (report to your doctor or health care professional if they continue or are bothersome):  confusion  decreased hearing  diarrhea  facial flushing  hair loss  headache  loss of appetite  missed menstrual periods  signs and symptoms of low red blood cells or anemia such as unusually weak or tired; feeling faint or lightheaded; falls  skin discoloration This list may not describe all possible side effects. Call your doctor for medical advice about side effects. You may report side effects to FDA at 1-800-FDA-1088. Where should I keep my medicine? This drug is given in a hospital or clinic and will not be stored at home. NOTE: This sheet is a summary. It may not cover all possible information. If you have questions about this medicine, talk to your doctor, pharmacist, or health care provider.  2020 Elsevier/Gold Standard (2019-08-04 09:53:29)
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
## 2020-05-12 ENCOUNTER — Telehealth: Payer: Self-pay | Admitting: Hematology and Oncology

## 2020-05-12 NOTE — Telephone Encounter (Signed)
Scheduled per 6/28 los. Called and left a msg, mailing appt letter and calendar printout

## 2020-05-24 ENCOUNTER — Encounter: Payer: Self-pay | Admitting: *Deleted

## 2020-05-25 ENCOUNTER — Telehealth: Payer: Self-pay | Admitting: Licensed Clinical Social Worker

## 2020-05-25 NOTE — Telephone Encounter (Signed)
Received return call from patient. Her friend who has been driving her states she is doing it "as a Engineer, manufacturing" and does not want to use the gift card. CSW reiterated to patient that she can use them for food as well. Patient voiced understanding and will contact this CSW if she needs more in the future.  Edwinna Areola Stoisits, LCSW

## 2020-05-25 NOTE — Telephone Encounter (Signed)
[facility]  CSW attempted to reach patient to check-in and determine if she would like next [place] cards at appointment Monday (CSW will not be available that day). No answer. Left VM with direct contact information.  Edwinna Areola Kolbie Lepkowski, LCSW

## 2020-05-28 MED FILL — Dexamethasone Sodium Phosphate Inj 100 MG/10ML: INTRAMUSCULAR | Qty: 1 | Status: AC
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
## 2020-05-31 ENCOUNTER — Inpatient Hospital Stay: Payer: Medicare HMO

## 2020-05-31 ENCOUNTER — Other Ambulatory Visit: Payer: Self-pay

## 2020-05-31 ENCOUNTER — Inpatient Hospital Stay: Payer: Medicare HMO | Admitting: Hematology and Oncology

## 2020-05-31 ENCOUNTER — Inpatient Hospital Stay: Payer: Medicare HMO | Attending: Hematology and Oncology

## 2020-05-31 DIAGNOSIS — Z95828 Presence of other vascular implants and grafts: Secondary | ICD-10-CM | POA: Insufficient documentation

## 2020-05-31 DIAGNOSIS — C50412 Malignant neoplasm of upper-outer quadrant of left female breast: Secondary | ICD-10-CM | POA: Insufficient documentation

## 2020-05-31 DIAGNOSIS — Z170 Estrogen receptor positive status [ER+]: Secondary | ICD-10-CM

## 2020-05-31 DIAGNOSIS — Z5111 Encounter for antineoplastic chemotherapy: Secondary | ICD-10-CM | POA: Insufficient documentation

## 2020-05-31 DIAGNOSIS — Z79899 Other long term (current) drug therapy: Secondary | ICD-10-CM | POA: Diagnosis not present

## 2020-05-31 DIAGNOSIS — C773 Secondary and unspecified malignant neoplasm of axilla and upper limb lymph nodes: Secondary | ICD-10-CM | POA: Diagnosis not present

## 2020-05-31 LAB — CBC WITH DIFFERENTIAL (CANCER CENTER ONLY)
Abs Immature Granulocytes: 0.09 10*3/uL — ABNORMAL HIGH (ref 0.00–0.07)
Basophils Absolute: 0 10*3/uL (ref 0.0–0.1)
Basophils Relative: 1 %
Eosinophils Absolute: 0.1 10*3/uL (ref 0.0–0.5)
Eosinophils Relative: 2 %
HCT: 37.9 % (ref 36.0–46.0)
Hemoglobin: 12.2 g/dL (ref 12.0–15.0)
Immature Granulocytes: 2 %
Lymphocytes Relative: 34 %
Lymphs Abs: 1.3 10*3/uL (ref 0.7–4.0)
MCH: 29.8 pg (ref 26.0–34.0)
MCHC: 32.2 g/dL (ref 30.0–36.0)
MCV: 92.7 fL (ref 80.0–100.0)
Monocytes Absolute: 0.9 10*3/uL (ref 0.1–1.0)
Monocytes Relative: 25 %
Neutro Abs: 1.3 10*3/uL — ABNORMAL LOW (ref 1.7–7.7)
Neutrophils Relative %: 36 %
Platelet Count: 169 10*3/uL (ref 150–400)
RBC: 4.09 MIL/uL (ref 3.87–5.11)
RDW: 13.7 % (ref 11.5–15.5)
WBC Count: 3.7 10*3/uL — ABNORMAL LOW (ref 4.0–10.5)
nRBC: 0 % (ref 0.0–0.2)

## 2020-05-31 LAB — CMP (CANCER CENTER ONLY)
ALT: 10 U/L (ref 0–44)
AST: 16 U/L (ref 15–41)
Albumin: 3.4 g/dL — ABNORMAL LOW (ref 3.5–5.0)
Alkaline Phosphatase: 113 U/L (ref 38–126)
Anion gap: 8 (ref 5–15)
BUN: 11 mg/dL (ref 8–23)
CO2: 23 mmol/L (ref 22–32)
Calcium: 9.1 mg/dL (ref 8.9–10.3)
Chloride: 108 mmol/L (ref 98–111)
Creatinine: 0.92 mg/dL (ref 0.44–1.00)
GFR, Est AFR Am: >60 mL/min (ref >60)
GFR, Estimated: >60 mL/min (ref >60)
Glucose, Bld: 204 mg/dL — ABNORMAL HIGH (ref 70–99)
Potassium: 4 mmol/L (ref 3.5–5.1)
Sodium: 139 mmol/L (ref 135–145)
Total Bilirubin: 0.3 mg/dL (ref 0.3–1.2)
Total Protein: 6.4 g/dL — ABNORMAL LOW (ref 6.5–8.1)

## 2020-05-31 MED ORDER — FLUOROURACIL CHEMO INJECTION 2.5 GM/50ML
500.0000 mg/m2 | Freq: Once | INTRAVENOUS | Status: AC
  Administered 2020-05-31: 850 mg via INTRAVENOUS
  Filled 2020-05-31: qty 17

## 2020-05-31 MED ORDER — PALONOSETRON HCL INJECTION 0.25 MG/5ML
0.2500 mg | Freq: Once | INTRAVENOUS | Status: AC
  Administered 2020-05-31: 0.25 mg via INTRAVENOUS

## 2020-05-31 MED ORDER — METHOTREXATE SODIUM (PF) CHEMO INJECTION 250 MG/10ML
25.0000 mg/m2 | Freq: Once | INTRAMUSCULAR | Status: AC
  Administered 2020-05-31: 42 mg via INTRAVENOUS
  Filled 2020-05-31: qty 1.68

## 2020-05-31 MED ORDER — SODIUM CHLORIDE 0.9% FLUSH
10.0000 mL | INTRAVENOUS | Status: DC | PRN
  Administered 2020-05-31: 10 mL
  Filled 2020-05-31: qty 10

## 2020-05-31 MED ORDER — SODIUM CHLORIDE 0.9 % IV SOLN
400.0000 mg/m2 | Freq: Once | INTRAVENOUS | Status: AC
  Administered 2020-05-31: 680 mg via INTRAVENOUS
  Filled 2020-05-31: qty 34

## 2020-05-31 MED ORDER — SODIUM CHLORIDE 0.9% FLUSH
10.0000 mL | Freq: Once | INTRAVENOUS | Status: AC
  Administered 2020-05-31: 10 mL
  Filled 2020-05-31: qty 10

## 2020-05-31 MED ORDER — SODIUM CHLORIDE 0.9 % IV SOLN
Freq: Once | INTRAVENOUS | Status: AC
  Filled 2020-05-31: qty 250

## 2020-05-31 MED ORDER — HEPARIN SOD (PORK) LOCK FLUSH 100 UNIT/ML IV SOLN
500.0000 [IU] | Freq: Once | INTRAVENOUS | Status: AC | PRN
  Administered 2020-05-31: 500 [IU]
  Filled 2020-05-31: qty 5

## 2020-05-31 MED ORDER — PALONOSETRON HCL INJECTION 0.25 MG/5ML
INTRAVENOUS | Status: AC
  Filled 2020-05-31: qty 5

## 2020-05-31 NOTE — Patient Instructions (Signed)
[facility] Discharge Instructions for Patients Receiving Chemotherapy  Today you received the following chemotherapy agents: cytoxan, methotrexate, fluorouracil  To help prevent nausea and vomiting after your treatment, we encourage you to take your nausea medication as directed.    If you develop nausea and vomiting that is not controlled by your nausea medication, call the clinic.   BELOW ARE SYMPTOMS THAT SHOULD BE REPORTED IMMEDIATELY:  *FEVER GREATER THAN 100.5 F  *CHILLS WITH OR WITHOUT FEVER  NAUSEA AND VOMITING THAT IS NOT CONTROLLED WITH YOUR NAUSEA MEDICATION  *UNUSUAL SHORTNESS OF BREATH  *UNUSUAL BRUISING OR BLEEDING  TENDERNESS IN MOUTH AND THROAT WITH OR WITHOUT PRESENCE OF ULCERS  *URINARY PROBLEMS  *BOWEL PROBLEMS  UNUSUAL RASH Items with * indicate a potential emergency and should be followed up as soon as possible.  Feel free to call the clinic should you have any questions or concerns. The clinic phone number is [phone].  Please show the [place] at check-in to the Emergency Department and triage nurse.
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
## 2020-06-01 ENCOUNTER — Telehealth: Payer: Self-pay | Admitting: Hematology and Oncology

## 2020-06-01 NOTE — Telephone Encounter (Signed)
No 7/19 los, no changes made to pt schedule

## 2020-06-10 ENCOUNTER — Institutional Professional Consult (permissible substitution): Payer: Medicare HMO | Admitting: Cardiology
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
## 2020-06-21 ENCOUNTER — Inpatient Hospital Stay: Payer: Medicare HMO | Admitting: Hematology and Oncology

## 2020-06-21 ENCOUNTER — Other Ambulatory Visit: Payer: Self-pay

## 2020-06-21 ENCOUNTER — Inpatient Hospital Stay: Payer: Medicare HMO | Attending: Hematology and Oncology

## 2020-06-21 ENCOUNTER — Inpatient Hospital Stay: Payer: Medicare HMO

## 2020-06-21 VITALS — BP 179/78

## 2020-06-21 DIAGNOSIS — Z95828 Presence of other vascular implants and grafts: Secondary | ICD-10-CM

## 2020-06-21 DIAGNOSIS — Z170 Estrogen receptor positive status [ER+]: Secondary | ICD-10-CM

## 2020-06-21 DIAGNOSIS — Z5111 Encounter for antineoplastic chemotherapy: Secondary | ICD-10-CM | POA: Insufficient documentation

## 2020-06-21 DIAGNOSIS — Z79899 Other long term (current) drug therapy: Secondary | ICD-10-CM | POA: Insufficient documentation

## 2020-06-21 DIAGNOSIS — C50412 Malignant neoplasm of upper-outer quadrant of left female breast: Secondary | ICD-10-CM

## 2020-06-21 LAB — CMP (CANCER CENTER ONLY)
ALT: 7 U/L (ref 0–44)
AST: 15 U/L (ref 15–41)
Albumin: 3.7 g/dL (ref 3.5–5.0)
Alkaline Phosphatase: 114 U/L (ref 38–126)
Anion gap: 9 (ref 5–15)
BUN: 13 mg/dL (ref 8–23)
CO2: 17 mmol/L — ABNORMAL LOW (ref 22–32)
Calcium: 9.8 mg/dL (ref 8.9–10.3)
Chloride: 109 mmol/L (ref 98–111)
Creatinine: 0.84 mg/dL (ref 0.44–1.00)
GFR, Est AFR Am: >60 mL/min (ref >60)
GFR, Estimated: >60 mL/min (ref >60)
Glucose, Bld: 140 mg/dL — ABNORMAL HIGH (ref 70–99)
Potassium: 4.2 mmol/L (ref 3.5–5.1)
Sodium: 135 mmol/L (ref 135–145)
Total Bilirubin: 0.4 mg/dL (ref 0.3–1.2)
Total Protein: 6.9 g/dL (ref 6.5–8.1)

## 2020-06-21 LAB — CBC WITH DIFFERENTIAL (CANCER CENTER ONLY)
Abs Immature Granulocytes: 0.04 10*3/uL (ref 0.00–0.07)
Basophils Absolute: 0 10*3/uL (ref 0.0–0.1)
Basophils Relative: 1 %
Eosinophils Absolute: 0.1 10*3/uL (ref 0.0–0.5)
Eosinophils Relative: 2 %
HCT: 37.9 % (ref 36.0–46.0)
Hemoglobin: 12.6 g/dL (ref 12.0–15.0)
Immature Granulocytes: 1 %
Lymphocytes Relative: 34 %
Lymphs Abs: 1.4 10*3/uL (ref 0.7–4.0)
MCH: 30.2 pg (ref 26.0–34.0)
MCHC: 33.2 g/dL (ref 30.0–36.0)
MCV: 90.9 fL (ref 80.0–100.0)
Monocytes Absolute: 0.8 10*3/uL (ref 0.1–1.0)
Monocytes Relative: 19 %
Neutro Abs: 1.7 10*3/uL (ref 1.7–7.7)
Neutrophils Relative %: 43 %
Platelet Count: 189 10*3/uL (ref 150–400)
RBC: 4.17 MIL/uL (ref 3.87–5.11)
RDW: 13.4 % (ref 11.5–15.5)
WBC Count: 4 10*3/uL (ref 4.0–10.5)
nRBC: 0 % (ref 0.0–0.2)

## 2020-06-21 MED ORDER — PALONOSETRON HCL INJECTION 0.25 MG/5ML
0.2500 mg | Freq: Once | INTRAVENOUS | Status: AC
  Administered 2020-06-21: 0.25 mg via INTRAVENOUS

## 2020-06-21 MED ORDER — FLUOROURACIL CHEMO INJECTION 2.5 GM/50ML
500.0000 mg/m2 | Freq: Once | INTRAVENOUS | Status: AC
  Administered 2020-06-21: 850 mg via INTRAVENOUS
  Filled 2020-06-21: qty 17

## 2020-06-21 MED ORDER — LIDOCAINE-PRILOCAINE 2.5-2.5 % EX CREA: 1.0000 "application " | TOPICAL_CREAM | Freq: Every day | CUTANEOUS | 0 refills | Status: DC | PRN

## 2020-06-21 MED ORDER — SODIUM CHLORIDE 0.9 % IV SOLN
400.0000 mg/m2 | Freq: Once | INTRAVENOUS | Status: AC
  Administered 2020-06-21: 680 mg via INTRAVENOUS
  Filled 2020-06-21: qty 34

## 2020-06-21 MED ORDER — PALONOSETRON HCL INJECTION 0.25 MG/5ML
INTRAVENOUS | Status: AC
  Filled 2020-06-21: qty 5

## 2020-06-21 MED ORDER — SODIUM CHLORIDE 0.9% FLUSH
10.0000 mL | Freq: Once | INTRAVENOUS | Status: AC
  Administered 2020-06-21: 10 mL
  Filled 2020-06-21: qty 10

## 2020-06-21 MED ORDER — SODIUM CHLORIDE 0.9% FLUSH
10.0000 mL | INTRAVENOUS | Status: DC | PRN
  Administered 2020-06-21: 10 mL
  Filled 2020-06-21: qty 10

## 2020-06-21 MED ORDER — METHOTREXATE SODIUM (PF) CHEMO INJECTION 250 MG/10ML
25.0000 mg/m2 | Freq: Once | INTRAMUSCULAR | Status: AC
  Administered 2020-06-21: 42 mg via INTRAVENOUS
  Filled 2020-06-21: qty 1.68

## 2020-06-21 MED ORDER — HEPARIN SOD (PORK) LOCK FLUSH 100 UNIT/ML IV SOLN
500.0000 [IU] | Freq: Once | INTRAVENOUS | Status: AC | PRN
  Administered 2020-06-21: 500 [IU]
  Filled 2020-06-21: qty 5

## 2020-06-21 MED ORDER — SODIUM CHLORIDE 0.9 % IV SOLN
Freq: Once | INTRAVENOUS | Status: AC
  Filled 2020-06-21: qty 250
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
## 2020-06-21 NOTE — Patient Instructions (Signed)

## 2020-06-22 ENCOUNTER — Telehealth: Payer: Self-pay | Admitting: Hematology and Oncology

## 2020-06-22 NOTE — Telephone Encounter (Signed)
No 8/9 los, no changes made to pt schedule   

## 2020-07-12 ENCOUNTER — Inpatient Hospital Stay: Payer: Medicare HMO

## 2020-07-12 ENCOUNTER — Other Ambulatory Visit: Payer: Self-pay

## 2020-07-12 ENCOUNTER — Encounter: Payer: Self-pay | Admitting: Medical

## 2020-07-12 ENCOUNTER — Inpatient Hospital Stay (HOSPITAL_BASED_OUTPATIENT_CLINIC_OR_DEPARTMENT_OTHER): Payer: Medicare HMO | Admitting: Medical

## 2020-07-12 ENCOUNTER — Encounter: Payer: Self-pay | Admitting: *Deleted

## 2020-07-12 VITALS — BP 168/70 | HR 58

## 2020-07-12 VITALS — BP 195/97 | HR 69 | Temp 95.7°F | Resp 17 | Ht 62.5 in | Wt 141.6 lb

## 2020-07-12 DIAGNOSIS — Z5111 Encounter for antineoplastic chemotherapy: Secondary | ICD-10-CM | POA: Diagnosis not present

## 2020-07-12 DIAGNOSIS — Z170 Estrogen receptor positive status [ER+]: Secondary | ICD-10-CM

## 2020-07-12 DIAGNOSIS — Z95828 Presence of other vascular implants and grafts: Secondary | ICD-10-CM

## 2020-07-12 DIAGNOSIS — C50412 Malignant neoplasm of upper-outer quadrant of left female breast: Secondary | ICD-10-CM

## 2020-07-12 LAB — CMP (CANCER CENTER ONLY)
ALT: 8 U/L (ref 0–44)
AST: 14 U/L — ABNORMAL LOW (ref 15–41)
Albumin: 3.6 g/dL (ref 3.5–5.0)
Alkaline Phosphatase: 104 U/L (ref 38–126)
Anion gap: 7 (ref 5–15)
BUN: 15 mg/dL (ref 8–23)
CO2: 20 mmol/L — ABNORMAL LOW (ref 22–32)
Calcium: 10.6 mg/dL — ABNORMAL HIGH (ref 8.9–10.3)
Chloride: 110 mmol/L (ref 98–111)
Creatinine: 0.88 mg/dL (ref 0.44–1.00)
GFR, Est AFR Am: >60 mL/min (ref >60)
GFR, Estimated: >60 mL/min (ref >60)
Glucose, Bld: 101 mg/dL — ABNORMAL HIGH (ref 70–99)
Potassium: 3.8 mmol/L (ref 3.5–5.1)
Sodium: 137 mmol/L (ref 135–145)
Total Bilirubin: 0.4 mg/dL (ref 0.3–1.2)
Total Protein: 6.7 g/dL (ref 6.5–8.1)

## 2020-07-12 LAB — CBC WITH DIFFERENTIAL (CANCER CENTER ONLY)
Abs Immature Granulocytes: 0.11 10*3/uL — ABNORMAL HIGH (ref 0.00–0.07)
Basophils Absolute: 0.1 10*3/uL (ref 0.0–0.1)
Basophils Relative: 1 %
Eosinophils Absolute: 0.1 10*3/uL (ref 0.0–0.5)
Eosinophils Relative: 3 %
HCT: 37.7 % (ref 36.0–46.0)
Hemoglobin: 12.3 g/dL (ref 12.0–15.0)
Immature Granulocytes: 2 %
Lymphocytes Relative: 30 %
Lymphs Abs: 1.4 10*3/uL (ref 0.7–4.0)
MCH: 30.2 pg (ref 26.0–34.0)
MCHC: 32.6 g/dL (ref 30.0–36.0)
MCV: 92.6 fL (ref 80.0–100.0)
Monocytes Absolute: 1.1 10*3/uL — ABNORMAL HIGH (ref 0.1–1.0)
Monocytes Relative: 23 %
Neutro Abs: 1.9 10*3/uL (ref 1.7–7.7)
Neutrophils Relative %: 41 %
Platelet Count: 191 10*3/uL (ref 150–400)
RBC: 4.07 MIL/uL (ref 3.87–5.11)
RDW: 14.5 % (ref 11.5–15.5)
WBC Count: 4.6 10*3/uL (ref 4.0–10.5)
nRBC: 0 % (ref 0.0–0.2)

## 2020-07-12 MED ORDER — HEPARIN SOD (PORK) LOCK FLUSH 100 UNIT/ML IV SOLN
500.0000 [IU] | Freq: Once | INTRAVENOUS | Status: AC | PRN
  Administered 2020-07-12: 500 [IU]
  Filled 2020-07-12: qty 5

## 2020-07-12 MED ORDER — SODIUM CHLORIDE 0.9% FLUSH
10.0000 mL | INTRAVENOUS | Status: DC | PRN
  Administered 2020-07-12: 10 mL
  Filled 2020-07-12: qty 10

## 2020-07-12 MED ORDER — PALONOSETRON HCL INJECTION 0.25 MG/5ML
INTRAVENOUS | Status: AC
  Filled 2020-07-12: qty 5

## 2020-07-12 MED ORDER — SODIUM CHLORIDE 0.9% FLUSH
10.0000 mL | Freq: Once | INTRAVENOUS | Status: AC
  Administered 2020-07-12: 10 mL
  Filled 2020-07-12: qty 10

## 2020-07-12 MED ORDER — PALONOSETRON HCL INJECTION 0.25 MG/5ML
0.2500 mg | Freq: Once | INTRAVENOUS | Status: AC
  Administered 2020-07-12: 0.25 mg via INTRAVENOUS

## 2020-07-12 MED ORDER — FLUOROURACIL CHEMO INJECTION 2.5 GM/50ML
500.0000 mg/m2 | Freq: Once | INTRAVENOUS | Status: AC
  Administered 2020-07-12: 850 mg via INTRAVENOUS
  Filled 2020-07-12: qty 17

## 2020-07-12 MED ORDER — SODIUM CHLORIDE 0.9 % IV SOLN
Freq: Once | INTRAVENOUS | Status: AC
  Filled 2020-07-12: qty 250

## 2020-07-12 MED ORDER — METHOTREXATE SODIUM (PF) CHEMO INJECTION 250 MG/10ML
25.0000 mg/m2 | Freq: Once | INTRAMUSCULAR | Status: AC
  Administered 2020-07-12: 42 mg via INTRAVENOUS
  Filled 2020-07-12: qty 1.68

## 2020-07-12 MED ORDER — SODIUM CHLORIDE 0.9 % IV SOLN
400.0000 mg/m2 | Freq: Once | INTRAVENOUS | Status: AC
  Administered 2020-07-12: 680 mg via INTRAVENOUS
  Filled 2020-07-12: qty 34

## 2020-07-12 NOTE — Progress Notes (Signed)
[facility] Progress Note   Colleen Lowery [id number] 1941/03/13 79 y.o.  Colleen Lowery is managed by Dr. Nicholas Lowery  Actively treated with chemotherapy/immunotherapy/hormonal therapy: yes  Current therapy: CMF  Last treated: 06/21/2020  Next scheduled appointment with provider: 08/02/2020  Assessment: Plan:    Malignant neoplasm of upper-outer quadrant of left breast in female, estrogen receptor positive ([place])  Please see After Visit Summary for patient specific instructions.  Future Appointments  Date Time Provider [place]  08/02/2020  8:15 AM [facility] LAB [facility] None  08/02/2020  8:30 AM [facility] [place] FLUSH [facility] None  08/02/2020  9:00 AM Colleen Lose, MD [facility] None  08/02/2020 10:00 AM [facility] INFUSION [facility] None    No orders of the defined types were placed in this encounter.      Subjective:   Patient ID:  Colleen Lowery is a 79 y.o. (DOB 01-Dec-1940) female.  Chief Complaint:  Chief Complaint  Patient presents with  . Follow-up    HPI Colleen Lowery  is a 79 y.o. 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  She is followed by Dr. Nicholas Lowery and presents to the clinic today for cycle 5, day 1 of CMF.  She reports that she is having some constipation which is at her baseline.  She denies shortness of breath, chest pain, nausea, vomiting, diarrhea, or pain.  She is scheduled for a preop visit on 08/05/2020 and is scheduled to have her implanted defibrillator removed prior to beginning radiation therapy.  This is scheduled for 08/11/2020 and will be completed at [facility].  She is otherwise well with no acute issues of concern today.  Her complete oncologic history is as noted below.  SUMMARY OF ONCOLOGIC HISTORY:    Oncology History  Malignant neoplasm of upper-outer quadrant of left breast in female, estrogen receptor  positive ([place])  03/22/2020 Initial Diagnosis   Diffuse left breast swelling. skin thickening and two areas of irregular hypoechogenicity in the 2-3 o'clock region, 4.9cm, and at least 4 abnormal lymph nodes  with cortical thickening. 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, grade 2, HER-2 negative (1+), ER+ 30%, PR+ 10%, Ki67 15%   04/07/2020 Cancer Staging   Staging form: Breast, AJCC 8th Edition - Clinical stage from 04/07/2020: Stage IIIB (cT4b, cN1, cM0, G2, ER+, PR+, HER2-) - Signed by Colleen Lose, MD on 04/07/2020   04/19/2020 -  Chemotherapy   The patient had palonosetron (ALOXI) injection 0.25 mg, 0.25 mg, Intravenous,  Once, 3 of 6 cycles Administration: 0.25 mg (04/19/2020), 0.25 mg (05/10/2020), 0.25 mg (05/31/2020) methotrexate (PF) chemo injection 50 mg, 29.8 mg/m2 = 50.5 mg (100 % of original dose 30 mg/m2), Intravenous,  Once, 3 of 6 cycles Dose modification: 30 mg/m2 (original dose 30 mg/m2, Cycle 1, Reason: Provider Judgment), 25 mg/m2 (original dose 30 mg/m2, Cycle 3, Reason: Provider Judgment) Administration: 50 mg (04/19/2020), 50 mg (05/10/2020), 42 mg (05/31/2020) cyclophosphamide (CYTOXAN) 840 mg in sodium chloride 0.9 % 250 mL chemo infusion, 500 mg/m2 = 840 mg (100 % of original dose 500 mg/m2), Intravenous,  Once, 3 of 6 cycles Dose modification: 500 mg/m2 (original dose 500 mg/m2, Cycle 1, Reason: Provider Judgment), 400 mg/m2 (original dose 500 mg/m2, Cycle 3, Reason: Provider Judgment) Administration: 840 mg (04/19/2020), 840 mg (05/10/2020), 680 mg (05/31/2020) fluorouracil (ADRUCIL) chemo injection 850 mg, 500 mg/m2 =  850 mg (100 % of original dose 500 mg/m2), Intravenous,  Once, 3 of 6 cycles Dose modification: 500 mg/m2 (original dose 500 mg/m2, Cycle 1, Reason: Provider Judgment) Administration: 850 mg (04/19/2020), 850 mg (05/10/2020), 850 mg (05/31/2020)  for chemotherapy treatment.       Medications: I have reviewed the patient's  current medications.  Allergies:  Allergies  Allergen Reactions  . Contrast Media [Iodinated Diagnostic Agents] Anaphylaxis    Throat closed, was 40 yrs ago  . Purell Instant Hand [Alcohol] Swelling    Swelling in the luNGS, CHEST AND THROAT and seizures  . Atorvastatin     Myalgia   . Dust Mite Extract Swelling    SWELLING IS GI SYSTEM  . Latex     QUESTIONABLE INTOLERABLE MUCOUS OOZING OUT OF THROAT  . Molds & Smuts Swelling    SWELLING IS IN GI SYSTEM    Past Medical History:  Diagnosis Date  . Allergy   . Arthritis    FINGERS  . Cancer [facility])    left breast  . Cataract    right  . Crohn disease ([place])   . Depression   . Diabetes mellitus without complication ([place])    TYPE 2 , TAKING OTC MED FORGYMEMA  . GERD (gastroesophageal reflux disease)   . Headache    sinus  . Heart failure, acute, systolic and diastolic ([place]) MARCH 4782 DX  . History of colonic polyps   . Hypertension   . IBS (irritable bowel syndrome)   . 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 Thyroid disease    AGE 89'S UNTIL  2016 OFF ALL THRYOID MEDS NOW   . Wears dentures   . Wears glasses     Past Surgical History:  Procedure Laterality Date  . ABDOMINAL HYSTERECTOMY     PARTIAL  . BREAST SURGERY    . CARDIAC CATHETERIZATION  02/10/2016   Procedure: Right/Left Heart Cath and Coronary Angiography;  Surgeon: Leonie Man, MD;  Location: [place] CV LAB;  Service: Cardiovascular;;  . CATARACT EXTRACTION W/ INTRAOCULAR LENS IMPLANT     left  . COLONOSCOPY WITH PROPOFOL N/A 02/16/2017   Procedure: COLONOSCOPY WITH PROPOFOL;  Surgeon: Carol Ada, MD;  Location: [facility];  Service: Endoscopy;  Laterality: N/A;  . EP IMPLANTABLE DEVICE N/A 02/10/2016   Procedure: ICD Implant;  Surgeon: Will Meredith Leeds, MD;  Location: [place] CV LAB;  Service: Cardiovascular;  Laterality: N/A;  . ESOPHAGOGASTRODUODENOSCOPY (EGD) WITH PROPOFOL N/A 02/16/2017   Procedure:  ESOPHAGOGASTRODUODENOSCOPY (EGD) WITH PROPOFOL;  Surgeon: Carol Ada, MD;  Location: [facility];  Service: Endoscopy;  Laterality: N/A;  reflux  . IR IMAGING GUIDED PORT INSERTION  04/27/2020  . TONSILLECTOMY      Family History  Problem Relation Age of Onset  . Diabetes Mother   . Stroke Mother   . Diabetes Brother   . Diabetes Maternal Grandmother   . Diabetes Paternal Grandfather   . Heart failure Paternal Grandfather   . Hypertension Brother   . Heart attack Neg Hx     Social History   Socioeconomic History  . Marital status: Single    Spouse name: Not on file  . Number of children: Not on file  . Years of education: Not on file  . Highest education level: Not on file  Occupational History  . Not on file  Tobacco Use  . Smoking status: Current Every Day Smoker    Packs/day:  0.50    Years: 50.00    Pack years: 25.00    Types: Cigarettes  . Smokeless tobacco: Never Used  . Tobacco comment: 3 puffs of each cigarette  Vaping Use  . Vaping Use: Never used  Substance and Sexual Activity  . Alcohol use: No  . Drug use: No  . Sexual activity: Never  Other Topics Concern  . Not on file  Social History Narrative  . Not on file   Social Determinants of Health   Financial Resource Strain:   . Difficulty of Paying Living Expenses: Not on file  Food Insecurity:   . Worried About Charity fundraiser in the Last Year: Not on file  . Ran Out of Food in the Last Year: Not on file  Transportation Needs:   . Lack of Transportation (Medical): Not on file  . Lack of Transportation (Non-Medical): Not on file  Physical Activity:   . Days of Exercise per Week: Not on file  . Minutes of Exercise per Session: Not on file  Stress:   . Feeling of Stress : Not on file  Social Connections:   . Frequency of Communication with Friends and Family: Not on file  . Frequency of Social Gatherings with Friends and Family: Not on file  . Attends Religious Services: Not on file  .  Active Member of Clubs or Organizations: Not on file  . Attends Archivist Meetings: Not on file  . Marital Status: Not on file  Intimate Partner Violence:   . Fear of Current or Ex-Partner: Not on file  . Emotionally Abused: Not on file  . Physically Abused: Not on file  . Sexually Abused: Not on file    Past Medical History, Surgical history, Social history, and Family history were reviewed and updated as appropriate.   Please see review of systems for further details on the patient's review from today.   Review of Systems:  Review of Systems  Constitutional: Negative for appetite change, chills, diaphoresis and fever.  HENT: Negative for trouble swallowing.   Respiratory: Negative for cough, chest tightness and shortness of breath.   Cardiovascular: Negative for chest pain, palpitations and leg swelling.  Gastrointestinal: Positive for constipation. Negative for abdominal distention, abdominal pain, blood in stool, diarrhea, nausea and vomiting.  Genitourinary: Negative for decreased urine volume and difficulty urinating.  Musculoskeletal: Negative for arthralgias, gait problem and myalgias.  Neurological: Negative for dizziness, weakness, light-headedness and headaches.    Objective:   Physical Exam:  BP (!) 195/97 (BP Location: Left Arm, Patient Position: Sitting) Comment: Liz rn WAS AWARE.  Pulse 69   Temp (!) 95.7 F (35.4 C) (Tympanic)   Resp 17   Ht 5' 2.5" (1.588 m)   Wt 141 lb 9.6 oz (64.2 kg)   SpO2 100% Comment: RA  BMI 25.49 kg/m  ECOG: 0  Physical Exam Constitutional:      General: She is not in acute distress.    Appearance: She is not diaphoretic.  HENT:     Head: Normocephalic and atraumatic.  Eyes:     General: No scleral icterus.       Right eye: No discharge.        Left eye: No discharge.  Cardiovascular:     Rate and Rhythm: Normal rate and regular rhythm.     Heart sounds: Normal heart sounds. No murmur heard.  No friction rub.  No gallop.   Pulmonary:     Effort: Pulmonary effort is  normal. No respiratory distress.     Breath sounds: Normal breath sounds. No wheezing or rales.  Abdominal:     General: Abdomen is flat. Bowel sounds are normal. There is no distension.     Tenderness: There is no abdominal tenderness. There is no guarding.  Musculoskeletal:     Right lower leg: No edema.     Left lower leg: No edema.  Skin:    General: Skin is warm and dry.     Findings: No erythema or rash.  Neurological:     Mental Status: She is alert.     Coordination: Coordination normal.     Gait: Gait normal.  Psychiatric:        Mood and Affect: Mood normal.        Behavior: Behavior normal.        Thought Content: Thought content normal.        Judgment: Judgment normal.     Lab Review:     Component Value Date/Time   NA 137 07/12/2020 0851   NA 141 06/20/2012 1055   K 3.8 07/12/2020 0851   K 3.6 06/20/2012 1055   CL 110 07/12/2020 0851   CL 109 (H) 06/20/2012 1055   CO2 20 (L) 07/12/2020 0851   CO2 25 06/20/2012 1055   GLUCOSE 101 (H) 07/12/2020 0851   GLUCOSE 173 (H) 06/20/2012 1055   BUN 15 07/12/2020 0851   BUN 12 06/20/2012 1055   CREATININE 0.88 07/12/2020 0851   CREATININE 0.92 05/09/2016 1053   CALCIUM 10.6 (H) 07/12/2020 0851   CALCIUM 9.0 06/20/2012 1055   PROT 6.7 07/12/2020 0851   ALBUMIN 3.6 07/12/2020 0851   AST 14 (L) 07/12/2020 0851   ALT 8 07/12/2020 0851   ALKPHOS 104 07/12/2020 0851   BILITOT 0.4 07/12/2020 0851   GFRNONAA >60 07/12/2020 0851   GFRNONAA >60 06/20/2012 1055   GFRAA >60 07/12/2020 0851   GFRAA >60 06/20/2012 1055       Component Value Date/Time   WBC 4.6 07/12/2020 0851   WBC 4.6 04/27/2020 1314   RBC 4.07 07/12/2020 0851   HGB 12.3 07/12/2020 0851   HGB 13.9 06/20/2012 1055   HCT 37.7 07/12/2020 0851   HCT 40.1 06/20/2012 1055   PLT 191 07/12/2020 0851   PLT 308 06/20/2012 1055   MCV 92.6 07/12/2020 0851   MCV 93 06/20/2012 1055   MCH 30.2  07/12/2020 0851   MCHC 32.6 07/12/2020 0851   RDW 14.5 07/12/2020 0851   RDW 14.3 06/20/2012 1055   LYMPHSABS 1.4 07/12/2020 0851   LYMPHSABS 2.1 06/20/2012 1055   MONOABS 1.1 (H) 07/12/2020 0851   MONOABS 0.6 06/20/2012 1055   EOSABS 0.1 07/12/2020 0851   EOSABS 0.0 06/20/2012 1055   BASOSABS 0.1 07/12/2020 0851   BASOSABS 0.1 06/20/2012 1055   -------------------------------  Imaging from last 24 hours (if applicable):  Radiology interpretation: No results found.    OK to treat pending chemistry. Sandi Mealy, MHS, PA-C

## 2020-07-12 NOTE — Patient Instructions (Signed)

## 2020-07-12 NOTE — Patient Instructions (Signed)
[place] Discharge Instructions for Patients Receiving Chemotherapy  Today you received the following chemotherapy agents: cytoxan, methotrexate, fluorouracil  To help prevent nausea and vomiting after your treatment, we encourage you to take your nausea medication as directed.    If you develop nausea and vomiting that is not controlled by your nausea medication, call the clinic.   BELOW ARE SYMPTOMS THAT SHOULD BE REPORTED IMMEDIATELY:  *FEVER GREATER THAN 100.5 F  *CHILLS WITH OR WITHOUT FEVER  NAUSEA AND VOMITING THAT IS NOT CONTROLLED WITH YOUR NAUSEA MEDICATION  *UNUSUAL SHORTNESS OF BREATH  *UNUSUAL BRUISING OR BLEEDING  TENDERNESS IN MOUTH AND THROAT WITH OR WITHOUT PRESENCE OF ULCERS  *URINARY PROBLEMS  *BOWEL PROBLEMS  UNUSUAL RASH Items with * indicate a potential emergency and should be followed up as soon as possible.  Feel free to call the clinic should you have any questions or concerns. The clinic phone number is [phone].  Please show the [place] at check-in to the Emergency Department and triage nurse.
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
## 2020-08-02 ENCOUNTER — Inpatient Hospital Stay: Payer: Medicare HMO | Admitting: Hematology and Oncology

## 2020-08-02 ENCOUNTER — Other Ambulatory Visit: Payer: Self-pay

## 2020-08-02 ENCOUNTER — Inpatient Hospital Stay: Payer: Medicare HMO

## 2020-08-02 ENCOUNTER — Encounter: Payer: Self-pay | Admitting: *Deleted

## 2020-08-02 ENCOUNTER — Inpatient Hospital Stay: Payer: Medicare HMO | Attending: Hematology and Oncology

## 2020-08-02 VITALS — BP 177/75 | HR 58

## 2020-08-02 DIAGNOSIS — Z170 Estrogen receptor positive status [ER+]: Secondary | ICD-10-CM

## 2020-08-02 DIAGNOSIS — C50412 Malignant neoplasm of upper-outer quadrant of left female breast: Secondary | ICD-10-CM

## 2020-08-02 DIAGNOSIS — Z79899 Other long term (current) drug therapy: Secondary | ICD-10-CM | POA: Insufficient documentation

## 2020-08-02 DIAGNOSIS — Z5111 Encounter for antineoplastic chemotherapy: Secondary | ICD-10-CM | POA: Insufficient documentation

## 2020-08-02 DIAGNOSIS — Z95828 Presence of other vascular implants and grafts: Secondary | ICD-10-CM

## 2020-08-02 LAB — CBC WITH DIFFERENTIAL (CANCER CENTER ONLY)
Abs Immature Granulocytes: 0.07 10*3/uL (ref 0.00–0.07)
Basophils Absolute: 0 10*3/uL (ref 0.0–0.1)
Basophils Relative: 1 %
Eosinophils Absolute: 0.1 10*3/uL (ref 0.0–0.5)
Eosinophils Relative: 2 %
HCT: 37.1 % (ref 36.0–46.0)
Hemoglobin: 12.3 g/dL (ref 12.0–15.0)
Immature Granulocytes: 2 %
Lymphocytes Relative: 25 %
Lymphs Abs: 1.2 10*3/uL (ref 0.7–4.0)
MCH: 31 pg (ref 26.0–34.0)
MCHC: 33.2 g/dL (ref 30.0–36.0)
MCV: 93.5 fL (ref 80.0–100.0)
Monocytes Absolute: 0.9 10*3/uL (ref 0.1–1.0)
Monocytes Relative: 20 %
Neutro Abs: 2.3 10*3/uL (ref 1.7–7.7)
Neutrophils Relative %: 50 %
Platelet Count: 206 10*3/uL (ref 150–400)
RBC: 3.97 MIL/uL (ref 3.87–5.11)
RDW: 14.3 % (ref 11.5–15.5)
WBC Count: 4.6 10*3/uL (ref 4.0–10.5)
nRBC: 0 % (ref 0.0–0.2)

## 2020-08-02 LAB — CMP (CANCER CENTER ONLY)
ALT: 11 U/L (ref 0–44)
AST: 17 U/L (ref 15–41)
Albumin: 3.5 g/dL (ref 3.5–5.0)
Alkaline Phosphatase: 116 U/L (ref 38–126)
Anion gap: 9 (ref 5–15)
BUN: 20 mg/dL (ref 8–23)
CO2: 20 mmol/L — ABNORMAL LOW (ref 22–32)
Calcium: 9.3 mg/dL (ref 8.9–10.3)
Chloride: 109 mmol/L (ref 98–111)
Creatinine: 0.91 mg/dL (ref 0.44–1.00)
GFR, Est AFR Am: >60 mL/min (ref >60)
GFR, Estimated: >60 mL/min (ref >60)
Glucose, Bld: 155 mg/dL — ABNORMAL HIGH (ref 70–99)
Potassium: 4.1 mmol/L (ref 3.5–5.1)
Sodium: 138 mmol/L (ref 135–145)
Total Bilirubin: 0.5 mg/dL (ref 0.3–1.2)
Total Protein: 6.7 g/dL (ref 6.5–8.1)

## 2020-08-02 MED ORDER — PALONOSETRON HCL INJECTION 0.25 MG/5ML
INTRAVENOUS | Status: AC
  Filled 2020-08-02: qty 5

## 2020-08-02 MED ORDER — SODIUM CHLORIDE 0.9 % IV SOLN
400.0000 mg/m2 | Freq: Once | INTRAVENOUS | Status: AC
  Administered 2020-08-02: 680 mg via INTRAVENOUS
  Filled 2020-08-02: qty 34

## 2020-08-02 MED ORDER — PALONOSETRON HCL INJECTION 0.25 MG/5ML
0.2500 mg | Freq: Once | INTRAVENOUS | Status: AC
  Administered 2020-08-02: 0.25 mg via INTRAVENOUS

## 2020-08-02 MED ORDER — SODIUM CHLORIDE 0.9% FLUSH
10.0000 mL | Freq: Once | INTRAVENOUS | Status: AC
  Administered 2020-08-02: 10 mL
  Filled 2020-08-02: qty 10

## 2020-08-02 MED ORDER — METHOTREXATE SODIUM (PF) CHEMO INJECTION 250 MG/10ML
25.0000 mg/m2 | Freq: Once | INTRAMUSCULAR | Status: AC
  Administered 2020-08-02: 42 mg via INTRAVENOUS
  Filled 2020-08-02: qty 1.68

## 2020-08-02 MED ORDER — HEPARIN SOD (PORK) LOCK FLUSH 100 UNIT/ML IV SOLN
500.0000 [IU] | Freq: Once | INTRAVENOUS | Status: AC | PRN
  Administered 2020-08-02: 500 [IU]
  Filled 2020-08-02: qty 5

## 2020-08-02 MED ORDER — SODIUM CHLORIDE 0.9% FLUSH
10.0000 mL | INTRAVENOUS | Status: DC | PRN
  Administered 2020-08-02: 10 mL
  Filled 2020-08-02: qty 10

## 2020-08-02 MED ORDER — FLUOROURACIL CHEMO INJECTION 2.5 GM/50ML
500.0000 mg/m2 | Freq: Once | INTRAVENOUS | Status: AC
  Administered 2020-08-02: 850 mg via INTRAVENOUS
  Filled 2020-08-02: qty 17

## 2020-08-02 MED ORDER — SODIUM CHLORIDE 0.9 % IV SOLN
Freq: Once | INTRAVENOUS | Status: AC
  Filled 2020-08-02: qty 250

## 2020-08-02 NOTE — Patient Instructions (Signed)
[facility] Discharge Instructions for Patients Receiving Chemotherapy  Today you received the following chemotherapy agents: cytoxan, methotrexate, fluorouracil  To help prevent nausea and vomiting after your treatment, we encourage you to take your nausea medication as directed.    If you develop nausea and vomiting that is not controlled by your nausea medication, call the clinic.   BELOW ARE SYMPTOMS THAT SHOULD BE REPORTED IMMEDIATELY:  *FEVER GREATER THAN 100.5 F  *CHILLS WITH OR WITHOUT FEVER  NAUSEA AND VOMITING THAT IS NOT CONTROLLED WITH YOUR NAUSEA MEDICATION  *UNUSUAL SHORTNESS OF BREATH  *UNUSUAL BRUISING OR BLEEDING  TENDERNESS IN MOUTH AND THROAT WITH OR WITHOUT PRESENCE OF ULCERS  *URINARY PROBLEMS  *BOWEL PROBLEMS  UNUSUAL RASH Items with * indicate a potential emergency and should be followed up as soon as possible.  Feel free to call the clinic should you have any questions or concerns. The clinic phone number is [phone].  Please show the [place] at check-in to the Emergency Department and triage nurse.

## 2020-08-02 NOTE — Patient Instructions (Signed)
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
## 2020-08-03 ENCOUNTER — Telehealth: Payer: Self-pay | Admitting: Hematology and Oncology

## 2020-08-03 NOTE — Telephone Encounter (Signed)
No 9/20 los. No changes made to pt's schedule.

## 2020-08-09 ENCOUNTER — Ambulatory Visit: Payer: Self-pay | Admitting: Surgery

## 2020-08-09 NOTE — H&P (Signed)
Colleen Lowery Appointment: 08/09/2020 11:30 AM Location: [facility] Patient #: [id number] DOB: 09-Jul-1941 Single / Language: Colleen Lowery / Race: Black or African American Female  History of Present Illness Colleen Moores A. Adekunle Rohrbach MD; 08/09/2020 12:17 PM) Patient words: Colleen Lowery is a 79 y.o. with above-mentioned history of left breast cancer currently on neoadjuvant chemotherapy with CMF. She is enrolled chemotherapy and has completed all 6 cycles. 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 Clinically she's had a good response and feels relatively well considering where she's been through. She understands she'll need a left modified radical mastectomy. Other than for generalized fatigue and weakness, she denies any nausea or vomiting.                Malignant neoplasm of upper-outer quadrant of left breast in female, estrogen receptor positive ([place]) 03/22/2020:diffuse left breast swelling. skin thickening and two areas of irregular hypoechogenicity in the 2-3 o'clock region, 4.9cm, and at least 4 abnormal lymph nodes with cortical thickening. 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  The patient is a 79 year old female.   Medication History (Colleen Lowery, CMA; 08/09/2020 11:39 AM) Aspirin Low Dose (81MG Tablet DR, Oral) Active. Furosemide (20MG Tablet, Oral) Active. Losartan Potassium (50MG Tablet, Oral) Active. Potassium Chloride Crys ER (20MEQ Tablet ER, Oral) Active. Sotalol HCl (80MG Tablet, Oral) Active. Medications Reconciled    Vitals (Colleen Lowery CMA; 08/09/2020 11:38 AM) 08/09/2020 11:38 AM Weight: 140.38 lb Height: 62in Body Surface Area: 1.64 m Body Mass Index: 25.67 kg/m  Temp.: 97.28F   Pulse: 72 (Regular)  P.OX: 99% (Room air) BP: 140/80(Sitting, Left Arm, Standard)        Physical Exam (Colleen Culton A. Jodey Burbano MD; 08/09/2020 12:18 PM)  General Mental Status-Alert. General Appearance-Consistent with stated age. Hydration-Well hydrated. Voice-Normal.  Head and Neck Head-normocephalic, atraumatic with no lesions or palpable masses. Trachea-midline. Thyroid Gland Characteristics - normal size and consistency.  Chest and Lung Exam Note: Thickness the left breast is much less today. No masses are palpated. Left axilla shows no adenopathy. Left chest defibrillator in place. Right breast is normal. Right port is intact.  Cardiovascular Cardiovascular examination reveals -normal heart sounds, regular rate and rhythm with no murmurs and normal pedal pulses bilaterally.  Neurologic Neurologic evaluation reveals -alert and oriented x 3 with no impairment of recent or remote memory. Mental Status-Normal.  Lymphatic Head & Neck  General Head & Neck Lymphatics: Bilateral - Description - Normal. Axillary  General Axillary Region: Bilateral - Description - Normal. Tenderness - Non Tender.    Assessment & Plan (Colleen Silguero A. Joeseph Verville MD; 08/09/2020 12:19 PM)  INFLAMMATORY BREAST CANCER, LEFT (C50.912) Impression: finished neoadjuvant chemotherapy with a good result needs left MRM Discussed treatment options for breast cancer to include breast conservation vs mastectomy with reconstruction. Pt has decided on mastectomy. Risk include bleeding, infection, flap necrosis, pain, numbness, recurrence, hematoma, other surgery needs. Pt understands and agrees to proceed. She is scheduled to have her generator removed from the left and right side since she will need postoperative radiation therapy. I will schedule surgery about a month out since that is to be done 2 days from now. total time 45 minutes  Current Plans Pt Education - CCS Mastectomy HCI Pt  Education -  ABC (After Breast Cancer) Class Info: discussed with patient and provided information.

## 2020-08-13 ENCOUNTER — Encounter: Payer: Self-pay | Admitting: *Deleted

## 2020-08-13 DIAGNOSIS — Z170 Estrogen receptor positive status [ER+]: Secondary | ICD-10-CM

## 2020-08-13 DIAGNOSIS — C50412 Malignant neoplasm of upper-outer quadrant of left female breast: Secondary | ICD-10-CM

## 2020-08-17 ENCOUNTER — Telehealth: Payer: Self-pay | Admitting: Hematology and Oncology

## 2020-08-17 NOTE — Telephone Encounter (Signed)
Per 10/01 scheduling message - called and left a message that patient has been scheduled for pot op appt on 11/10 at 3:15

## 2020-08-19 ENCOUNTER — Encounter: Payer: Self-pay | Admitting: *Deleted

## 2020-08-31 ENCOUNTER — Ambulatory Visit: Payer: Medicare HMO | Attending: Surgery | Admitting: Rehabilitation

## 2020-08-31 ENCOUNTER — Encounter: Payer: Self-pay | Admitting: Rehabilitation

## 2020-08-31 ENCOUNTER — Other Ambulatory Visit: Payer: Self-pay

## 2020-08-31 DIAGNOSIS — R293 Abnormal posture: Secondary | ICD-10-CM

## 2020-08-31 NOTE — Therapy (Signed)
[place], [place], [address] Phone: [phone]   Fax:  [phone]  Physical Therapy Evaluation  Patient Details  Name: Colleen Lowery MRN: [id number] Date of Birth: [birth year] Referring Provider (PT): Dr. Brantley Stage   Encounter Date: 08/31/2020   PT End of Session - 08/31/20 1046    Visit Number 1    Number of Visits 2    Date for PT Re-Evaluation 10/12/20    PT Start Time 2751    PT Stop Time 1045    PT Time Calculation (min) 31 min    Activity Tolerance Patient tolerated treatment well    Behavior During Therapy [facility] for tasks assessed/performed           Past Medical History:  Diagnosis Date  . Allergy   . Arthritis    FINGERS  . Cancer [facility])    left breast  . Cataract    right  . Crohn disease ([place])   . Depression   . Diabetes mellitus without complication ([place])    TYPE 2 , TAKING OTC MED FORGYMEMA  . GERD (gastroesophageal reflux disease)   . Headache    sinus  . Heart failure, acute, systolic and diastolic ([place]) MARCH 7001 DX  . History of colonic polyps   . Hypertension   . IBS (irritable bowel syndrome)   . 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 Thyroid disease    AGE 79'S UNTIL  2016 OFF ALL THRYOID MEDS NOW   . Wears dentures   . Wears glasses     Past Surgical History:  Procedure Laterality Date  . ABDOMINAL HYSTERECTOMY     PARTIAL  . BREAST SURGERY    . CARDIAC CATHETERIZATION  02/10/2016   Procedure: Right/Left Heart Cath and Coronary Angiography;  Surgeon: Leonie Man, MD;  Location: [place] CV LAB;  Service: Cardiovascular;;  . CATARACT EXTRACTION W/ INTRAOCULAR LENS IMPLANT     left  . COLONOSCOPY WITH PROPOFOL N/A 02/16/2017   Procedure: COLONOSCOPY WITH PROPOFOL;  Surgeon: Carol Ada, MD;  Location: [facility];  Service: Endoscopy;  Laterality: N/A;  . EP IMPLANTABLE DEVICE N/A 02/10/2016   Procedure: ICD Implant;   Surgeon: Will Meredith Leeds, MD;  Location: [place] CV LAB;  Service: Cardiovascular;  Laterality: N/A;  . ESOPHAGOGASTRODUODENOSCOPY (EGD) WITH PROPOFOL N/A 02/16/2017   Procedure: ESOPHAGOGASTRODUODENOSCOPY (EGD) WITH PROPOFOL;  Surgeon: Carol Ada, MD;  Location: [facility];  Service: Endoscopy;  Laterality: N/A;  reflux  . IR IMAGING GUIDED PORT INSERTION  04/27/2020  . TONSILLECTOMY      There were no vitals filed for this visit.    Subjective Assessment - 08/31/20 1013    Subjective I will be having a mastectomy on 09/14/20.  I am not supposed to be around Purell. I am having fluid since starting Chemotherapy    Pertinent History Left IDC ER postitive stage IIIB, T4BN1 with mastectomy and ALND on 09/14/20 with Dr. Brantley Stage.  Neoadjuvant chemotherapy completed with CMF with no adverse effects.  Will be having radiation after surgery.  Other history of DM, heart failure, electrodes monitoring ventricular tachycardia.    Limitations Walking   I walk slower   Patient Stated Goals get information    Currently in Pain? No/denies              [facility] PT Assessment - 08/31/20 0001      Assessment   Medical Diagnosis left  breast cancer    Referring Provider (PT) Dr. Brantley Stage    Onset Date/Surgical Date 08/13/20    Hand Dominance Right      Precautions   Precaution Comments active cancer, *Allergic/Seizure with sanitizer product exposure      Restrictions   Weight Bearing Restrictions No      Balance Screen   Has the patient fallen in the past 6 months No    Has the patient had a decrease in activity level because of a fear of falling?  No    Is the patient reluctant to leave their home because of a fear of falling?  No      Home Environment   Living Environment Private residence    Living Arrangements Alone    Available Help at Discharge Friend(s);Family    Home Access Stairs to enter    [place] One level      Prior Function   Vocation Retired    Leisure I exercise  in the house.  I walk around      Cognition   Overall Cognitive Status Within Functional Limits for tasks assessed      Observation/Other Assessments   Observations Pt wearing heart monitor for v tach so SOZO was not performed today       Coordination   Gross Motor Movements are Fluid and Coordinated Yes      Posture/Postural Control   Posture/Postural Control Postural limitations    Postural Limitations Rounded Shoulders;Forward head      ROM / Strength   AROM / PROM / Strength AROM      AROM   AROM Assessment Site Shoulder    Right/Left Shoulder Right;Left    Left Shoulder Flexion 135 Degrees    Left Shoulder ABduction 130 Degrees      Ambulation/Gait   Gait Comments walks without AD and without LOB             LYMPHEDEMA/ONCOLOGY QUESTIONNAIRE - 08/31/20 0001      Type   Cancer Type Lt breast      Treatment   Active Chemotherapy Treatment No    Past Chemotherapy Treatment Yes      Lymphedema Assessments   Lymphedema Assessments Upper extremities      Right Upper Extremity Lymphedema   10 cm Proximal to Olecranon Process 26.4 cm    Olecranon Process 23.5 cm    10 cm Proximal to Ulnar Styloid Process 20 cm    Just Proximal to Ulnar Styloid Process 15.5 cm      Left Upper Extremity Lymphedema   10 cm Proximal to Olecranon Process 27 cm    Olecranon Process 24.5 cm    10 cm Proximal to Ulnar Styloid Process 19.5 cm    Just Proximal to Ulnar Styloid Process 15.6 cm                   Objective measurements completed on examination: See above findings.               PT Education - 08/31/20 1045    Education Details reasons for pre-op visit, post op exercises with drains, sleeping position, lymphedema briefly    Person(s) Educated Patient    Methods Explanation;Handout;Verbal cues;Demonstration    Comprehension Verbalized understanding;Returned demonstration;Verbal cues required                Breast Clinic Goals - 08/31/20  1053      Patient will be able to verbalize understanding of pertinent  lymphedema risk reduction practices relevant to her diagnosis specifically related to skin care.   Status Achieved      Patient will be able to return demonstrate and/or verbalize understanding of the post-op home exercise program related to regaining shoulder range of motion.   Status Achieved      Patient will be able to verbalize understanding of the importance of attending the postoperative After Breast Cancer Class for further lymphedema risk reduction education and therapeutic exercise.   Status Achieved                 Plan - 08/31/20 1047    Clinical Impression Statement Pt presents pre Left mastectomy and ALND having already completed neoadjuvant chemotherapy.  Pt overall is mobile and needs to assistance at home but is limted in leaving the house and going outside due to allergy to fragrances occasinally causing fainting/seizure.  Pt normally has a automatic defibrillator that has been removed for radiation but now has electrode based halter monitor so SOZO was not performed today.  May be able to start this at some point in the future.  Pt is now aware of post op stretches and exercises and when to return to PT for follow up    Personal Factors and Comorbidities Age;Comorbidity 2    Comorbidities active cancer, need for ALND and radiation, heart failure    Stability/Clinical Decision Making Stable/Uncomplicated    Clinical Decision Making Low    Rehab Potential Excellent    PT Frequency --   2 visits   PT Duration 6 weeks    PT Treatment/Interventions ADLs/Self Care Home Management;Therapeutic exercise;Manual lymph drainage;Manual techniques;Patient/family education;Passive range of motion;Taping    PT Next Visit Plan **No sanitizer use or in the room** post op check, prepare for radiation    PT Home Exercise Plan post op    Consulted and Agree with Plan of Care Patient           Patient will  benefit from skilled therapeutic intervention in order to improve the following deficits and impairments:  Decreased knowledge of precautions, Postural dysfunction  Visit Diagnosis: Abnormal posture     Problem List Patient Active Problem List   Diagnosis Date Noted  . Port-A-Cath in place 05/31/2020  . Malignant neoplasm of upper-outer quadrant of left breast in female, estrogen receptor positive ([place]) 04/07/2020  . Peripheral arterial disease ([place]) 06/14/2016  . Upper airway cough syndrome 06/08/2016  . CAD in native artery 05/31/2016  . Chronic combined systolic and diastolic congestive heart failure ([place]) 05/31/2016  . VT (ventricular tachycardia) ([place]) 02/09/2016  . Acute on chronic combined systolic and diastolic HF (heart failure) ([place]) 02/09/2016  . Contrast media allergy 02/09/2016  . Dyspnea 02/07/2016  . Benign essential HTN 02/07/2016  . Cigarette smoker 02/07/2016  . Diabetes ([place]) 08/02/2014  . Essential hypertension 08/02/2014    Stark Bray 08/31/2020, 10:54 AM  [place], [place], [address] Phone: [phone]   Fax:  [phone]  Name: TANECIA MCCAY MRN: [id number] Date of Birth: 1941-06-16

## 2020-08-31 NOTE — Patient Instructions (Signed)

## 2020-09-03 NOTE — Progress Notes (Addendum)
Your procedure is scheduled on Tuesday, November 2nd  Report to [facility] Main Entrance "A" at 8:00 A.M., and check in at the Admitting office.  Call this number if you have problems the morning of surgery:  [phone]  Call [phone] if you have any questions prior to your surgery date Monday-Friday 8am-4pm   Remember:  Do not eat after midnight the night before your surgery  You may drink clear liquids until 7:00 A.M. the morning of your surgery.   Clear liquids allowed are: Water, Non-Citrus Juices (without pulp), Carbonated Beverages, Clear Tea, Black Coffee Only, and Gatorade   Please complete your PRE-SURGERY water that was provided to by 7:00 A.M. the morning of surgery  Please, if able, drink it in one setting. DO NOT SIP.   Take these medicines the morning of surgery with A SIP OF WATER  sotalol (BETAPACE  If needed: nitroGLYCERIN (NITROSTAT)    As of today, STOP taking any Aspirin (unless otherwise instructed by your surgeon) Aleve, Naproxen, Ibuprofen, Motrin, Advil, Goody's, BC's, all herbal medications, fish oil, and all vitamins.  WHAT DO I DO ABOUT MY DIABETES MEDICATION?  HOW TO MANAGE YOUR DIABETES BEFORE AND AFTER SURGERY  Why is it important to control my blood sugar before and after surgery? . Improving blood sugar levels before and after surgery helps healing and can limit problems. . A way of improving blood sugar control is eating a healthy diet by: o  Eating less sugar and carbohydrates o  Increasing activity/exercise o  Talking with your doctor about reaching your blood sugar goals . High blood sugars (greater than 180 mg/dL) can raise your risk of infections and slow your recovery, so you will need to focus on controlling your diabetes during the weeks before surgery. . Make sure that the doctor who takes care of your diabetes knows about your planned surgery including the date and location.  How do I manage my blood sugar before surgery? . Check  your blood sugar at least 4 times a day, starting 2 days before surgery, to make sure that the level is not too high or low. . Check your blood sugar the morning of your surgery when you wake up and every 2 hours until you get to the Short Stay unit. o If your blood sugar is less than 70 mg/dL, you will need to treat for low blood sugar: - Do not take insulin. - Treat a low blood sugar (less than 70 mg/dL) with  cup of clear juice (cranberry or apple), 4 glucose tablets, OR glucose gel. - Recheck blood sugar in 15 minutes after treatment (to make sure it is greater than 70 mg/dL). If your blood sugar is not greater than 70 mg/dL on recheck, call [phone] for further instructions. . Report your blood sugar to the short stay nurse when you get to Short Stay.  . If you are admitted to the hospital after surgery: o Your blood sugar will be checked by the staff and you will probably be given insulin after surgery (instead of oral diabetes medicines) to make sure you have good blood sugar levels. o The goal for blood sugar control after surgery is 80-180 mg/dL             Do not wear jewelry, make up, or nail polish            Do not wear lotions, powders, perfumes,, or deodorant.            Do not  shave 48 hours prior to surgery.             Do not bring valuables to the hospital.            [facility] is not responsible for any belongings or valuables.  Do NOT Smoke (Tobacco/Vaping) or drink Alcohol 24 hours prior to your procedure If you use a CPAP at night, you may bring all equipment for your overnight stay.   Contacts, glasses, dentures or bridgework may not be worn into surgery.      For patients admitted to the hospital, discharge time will be determined by your treatment team.   Patients discharged the day of surgery will not be allowed to drive home, and someone needs to stay with them for 24 hours.  Special instructions:   [facility]- Preparing For Surgery  Before surgery,  you can play an important role. Because skin is not sterile, your skin needs to be as free of germs as possible. You can reduce the number of germs on your skin by washing with CHG (chlorahexidine gluconate) Soap before surgery.  CHG is an antiseptic cleaner which kills germs and bonds with the skin to continue killing germs even after washing.    Oral Hygiene is also important to reduce your risk of infection.  Remember - BRUSH YOUR TEETH THE MORNING OF SURGERY WITH YOUR REGULAR TOOTHPASTE  Please do not use if you have an allergy to CHG or antibacterial soaps. If your skin becomes reddened/irritated stop using the CHG.  Do not shave (including legs and underarms) for at least 48 hours prior to first CHG shower. It is OK to shave your face.  Please follow these instructions carefully.   1. Shower the NIGHT BEFORE SURGERY and the MORNING OF SURGERY with CHG Soap.   2. If you chose to wash your hair, wash your hair first as usual with your normal shampoo.  3. After you shampoo, rinse your hair and body thoroughly to remove the shampoo.  4. Use CHG as you would any other liquid soap. You can apply CHG directly to the skin and wash gently with a scrungie or a clean washcloth.   5. Apply the CHG Soap to your body ONLY FROM THE NECK DOWN.  Do not use on open wounds or open sores. Avoid contact with your eyes, ears, mouth and genitals (private parts). Wash Face and genitals (private parts)  with your normal soap.   6. Wash thoroughly, paying special attention to the area where your surgery will be performed.  7. Thoroughly rinse your body with warm water from the neck down.  8. DO NOT shower/wash with your normal soap after using and rinsing off the CHG Soap.  9. Pat yourself dry with a CLEAN TOWEL.  10. Wear CLEAN PAJAMAS to bed the night before surgery  11. Place CLEAN SHEETS on your bed the night of your first shower and DO NOT SLEEP WITH PETS  Day of Surgery: Wear Clean/Comfortable  clothing the morning of surgery Do not apply any deodorants/lotions.   Remember to brush your teeth WITH YOUR REGULAR TOOTHPASTE.   Please read over the following fact sheets that you were given.

## 2020-09-06 ENCOUNTER — Other Ambulatory Visit: Payer: Self-pay

## 2020-09-06 ENCOUNTER — Encounter (HOSPITAL_COMMUNITY): Payer: Self-pay

## 2020-09-06 ENCOUNTER — Encounter (HOSPITAL_COMMUNITY)
Admission: RE | Admit: 2020-09-06 | Discharge: 2020-09-06 | Disposition: A | Discharge status: Home / Self Care | Payer: Medicare HMO | Source: Ambulatory Visit | Attending: Surgery | Admitting: Surgery

## 2020-09-06 DIAGNOSIS — I110 Hypertensive heart disease with heart failure: Secondary | ICD-10-CM | POA: Insufficient documentation

## 2020-09-06 DIAGNOSIS — C50912 Malignant neoplasm of unspecified site of left female breast: Secondary | ICD-10-CM | POA: Insufficient documentation

## 2020-09-06 DIAGNOSIS — I5042 Chronic combined systolic (congestive) and diastolic (congestive) heart failure: Secondary | ICD-10-CM | POA: Diagnosis not present

## 2020-09-06 DIAGNOSIS — Z79899 Other long term (current) drug therapy: Secondary | ICD-10-CM | POA: Insufficient documentation

## 2020-09-06 DIAGNOSIS — Z01812 Encounter for preprocedural laboratory examination: Secondary | ICD-10-CM | POA: Diagnosis present

## 2020-09-06 HISTORY — DX: Hypothyroidism, unspecified: E03.9

## 2020-09-06 LAB — COMPREHENSIVE METABOLIC PANEL
ALT: 11 U/L (ref 0–44)
AST: 17 U/L (ref 15–41)
Albumin: 3.7 g/dL (ref 3.5–5.0)
Alkaline Phosphatase: 93 U/L (ref 38–126)
Anion gap: 9 (ref 5–15)
BUN: 12 mg/dL (ref 8–23)
CO2: 18 mmol/L — ABNORMAL LOW (ref 22–32)
Calcium: 9.5 mg/dL (ref 8.9–10.3)
Chloride: 112 mmol/L — ABNORMAL HIGH (ref 98–111)
Creatinine, Ser: 0.77 mg/dL (ref 0.44–1.00)
GFR, Estimated: >60 mL/min (ref >60)
Glucose, Bld: 147 mg/dL — ABNORMAL HIGH (ref 70–99)
Potassium: 4.1 mmol/L (ref 3.5–5.1)
Sodium: 139 mmol/L (ref 135–145)
Total Bilirubin: 0.8 mg/dL (ref 0.3–1.2)
Total Protein: 6.8 g/dL (ref 6.5–8.1)

## 2020-09-06 LAB — CBC WITH DIFFERENTIAL/PLATELET
Abs Immature Granulocytes: 0.04 10*3/uL (ref 0.00–0.07)
Basophils Absolute: 0 10*3/uL (ref 0.0–0.1)
Basophils Relative: 1 %
Eosinophils Absolute: 0.1 10*3/uL (ref 0.0–0.5)
Eosinophils Relative: 2 %
HCT: 41.1 % (ref 36.0–46.0)
Hemoglobin: 13.2 g/dL (ref 12.0–15.0)
Immature Granulocytes: 1 %
Lymphocytes Relative: 22 %
Lymphs Abs: 1.1 10*3/uL (ref 0.7–4.0)
MCH: 30.8 pg (ref 26.0–34.0)
MCHC: 32.1 g/dL (ref 30.0–36.0)
MCV: 96 fL (ref 80.0–100.0)
Monocytes Absolute: 0.7 10*3/uL (ref 0.1–1.0)
Monocytes Relative: 15 %
Neutro Abs: 2.8 10*3/uL (ref 1.7–7.7)
Neutrophils Relative %: 59 %
Platelets: 209 10*3/uL (ref 150–400)
RBC: 4.28 MIL/uL (ref 3.87–5.11)
RDW: 13.4 % (ref 11.5–15.5)
WBC: 4.8 10*3/uL (ref 4.0–10.5)
nRBC: 0 % (ref 0.0–0.2)

## 2020-09-06 LAB — GLUCOSE, CAPILLARY: Glucose-Capillary: 133 mg/dL — ABNORMAL HIGH (ref 70–99)

## 2020-09-06 NOTE — Progress Notes (Signed)
PCP - Dr. Lavera Guise Cardiologist - Dr. Elias Else Shantha  Cardiac device- Patient has a LifeVest Zoll (external device)  Chest x-ray - N/A EKG - 08/11/2020 - tracing requested Stress Test - denies ECHO - 02/08/16 Cardiac Cath - 02/10/16  Sleep Study - denies CPAP - N/A  Fasting Blood Sugar -  100 - 120s Checks Blood Sugar 1 time per day  Blood Thinner Instructions: N/A Aspirin Instructions: N/A  ERAS Protcol - Yes PRE-SURGERY Ensure or G2- 10 oz bottle of water given, G2 unavailable   COVID TEST- Scheduled for 09/10/2020. Patient verbalized understanding of self-quarantine instructions, appointment time and place.  Anesthesia review: YES, cardiac hx, LifeVest Zoll, recent ekg tracing requested  Patient denies shortness of breath, fever, cough and chest pain at PAT appointment  All instructions explained to the patient, with a verbal understanding of the material. Patient agrees to go over the instructions while at home for a better understanding. Patient also instructed to self quarantine after being tested for COVID-19. The opportunity to ask questions was provided.

## 2020-09-07 LAB — HEMOGLOBIN A1C
Hgb A1c MFr Bld: 6.6 % — ABNORMAL HIGH (ref 4.8–5.6)
Mean Plasma Glucose: 143 mg/dL

## 2020-09-07 NOTE — Progress Notes (Addendum)
Anesthesia Chart Review:  Case: [id number] Date/Time: 09/14/20 0945   Procedure: LEFT MASTECTOMY MODIFIED RADICAL (Left Breast) - PEC BLOCK, RNFA   Anesthesia type: General   Pre-op diagnosis: LEFT BREAST CANCER   Location: [place] OR ROOM 02 / [place] [facility]   Surgeons: Erroll Luna, MD      DISCUSSION: Patient is a 79 year old female scheduled for the above procedure.  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 She needed her ICD removed before she could have post-operative radiation, so left mastectomy scheduled ~ 30 days out for ICD extraction on 08/10/20. Patient is currently wearing a LifeVest.  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 By notes, she is a retired NP.   ICD was placed in 2017 for primary prevention. She had not had any ICD shocks or therapies. Given need for left breast radiotherapy, she was referred to EP for device extraction. She is s/p single-chamber ICD lead extraction and device extraction on 08/11/20 due to need for breast surgery and radiation. She wears a LifeVest with plans to implant a new ICD following completion of radiation therapy.   She has a right sided Port-a-cath. Last   Preoperative COVID-19 test is scheduled for 09/10/2020. Anesthesia team  to evaluate on the day of surgery. Anticipate use of defib pads while LifeVest off intra-operatively. Reviewed above with anesthesiologist Suzette Battiest, MD. (ADDENDUM 09/08/20 11:02 AM: Of note, patient with multiple allergies/sensitivies including to Purell Instant Hand Sanitizer, Latex, contrast media.)    VS: BP (!) 186/84   Pulse 68   Temp 36.8 C   Resp 18   Ht 5\' 2"  (1.575 m)   Wt 65.8 kg   SpO2 100%   BMI 26.54 kg/m    PROVIDERS: [name], MD is PCP  - Jonette Eva, Nathaniel Man, MD is EP cardiologist ([place]). Previously she saw Mahala Menghini, MD with the same practice and locally was seen by [facility] Daneen Schick, MD and Allegra Lai, MD). Last seen by for ICD extraction on 08/11/20. Last evaluation with general cardiology on 04/16/20 by Talbert Cage, NP for preoperative evaluation prior to Port-a-cath. CAD felt stable. On Sotalol for NSVT.  Melvyn Novas, Legrand Como, MD is pulmonologist. Last visit in 2017 for upper airway cough syndrome. Cough with some improvement after stopping ACE-I.   LABS: Labs reviewed: Acceptable for surgery. (all labs ordered are listed, but only abnormal results are displayed)  Labs Reviewed  GLUCOSE, CAPILLARY - Abnormal; Notable for the following components:      Result Value   Glucose-Capillary 133 (*)    All other components within normal limits  COMPREHENSIVE METABOLIC PANEL - Abnormal; Notable for the following components:   Chloride 112 (*)    CO2 18 (*)    Glucose, Bld 147 (*)    All other components within normal limits  HEMOGLOBIN A1C - Abnormal; Notable for the following components:   Hgb  A1c MFr Bld 6.6 (*)    All other components within normal limits  CBC WITH DIFFERENTIAL/PLATELET     IMAGES: PCXR 08/11/20([facility]): FINDINGS:  Supportive devices: Right subclavian Port-A-Cath in the anticipated location.  Cardiovascular/lungs/pleura: No new focal pulmonary consolidation. No pleural effusion. No  pneumothorax.  Other: None. IMPRESSION: There is no evidence of acute cardiac or pulmonary abnormality.  Bone scan 04/13/20: IMPRESSION: 1. No findings specific for metastatic disease to bone. 2. 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 3. Benign appearing right anterior rib fractures. Scattered degenerative radiotracer activity in the extremities.  CT Chest/abd/pelvis 04/13/20: IMPRESSION: 1. LEFT axillary lymphadenopathy and skin thickening and nodularity about the LEFT breast is partially visualized.w no current signs of distant disease. 2. Subtle ground-glass nodule at the RIGHT lung base, nonspecific. Suggest attention on subsequent imaging, follow-up in 3-6 months may be helpful. 3. 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 No perivascular stranding. The finding likely chronic in the setting of marked vascular disease correlate with any symptoms. 4. Abdominal aortic dilation to 2.5/2.6 cm. Ectatic abdominal aorta at risk for aneurysm development. Recommend followup by ultrasound in 5 years. This recommendation follows ACR consensus guidelines: White Paper of the ACR Incidental Findings Committee II on Vascular Findings. J Am Coll Radiol 2013; 10:789-794. Aortic aneurysm NOS (ICD10-I71.9) 5. Low-density lesion arising from the upper pole the LEFT kidney likely a cyst but with density values slightly greater than 30 Hounsfield units postcontrast. Consider renal sonography or follow-up renal protocol CT for further assessment. 6. Signs of subacute and chronic rib fractures along the RIGHT chest multiple sites. 7. Aortic atherosclerosis. - These results will be called to the ordering clinician or representative by the Radiologist Assistant, and communication documented in the PACS or [employer]. - Aortic Atherosclerosis (ICD10-I70.0). Electronically Signed By: Zetta Bills M.D. On:  04/13/2020 08:51   EKG: EKG 08/11/20 [facility]): Per Results Narrative: Atrial Rate            80    BPM          P-R Interval            164    ms           QRS Duration            118    ms          Q-T Interval            408    ms           QTC                470    ms           P Axis               63    degrees        R Axis               -10    degrees        T Axis               -22    degrees        Sinus rhythm with Premature supraventricular complexes  Possible LVH  Inferior infarct (cited on or before 14-Jun-2020)  When compared with ECG of 14-Jun-2020 15:39,  Premature supraventricular complexes are now present  Confirmed by Lanny Hurst ([id number]) on 08/12/2020 4:47:13 PM  EKG 06/14/20 [facility]): Copy of tracing on chart. Sinus bradycardia at 57 bpm Possible Inferior infarct , age undetermined  T wave abnormality, consider inferolateral ischemia  No previous ECGs available  Confirmed by Sunny Schlein ([id number]) on 06/14/2020 8:04:36 PM   EKG 02/04/20: Sinus rhythm Incomplete left bundle branch block Probable LVH with secondary repol abnrm Inferior infarct, age indeterminate NO STEMI Confirmed by Octaviano Glow [phone]) on 02/04/2020 3:24:00 PM   CV: EP Procedure 08/11/20 [facility]): Procedures: Single chamber ICD lead extraction and Device Extraction    Cardiac cath 02/10/16: 1. 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 2. There is moderate left ventricular systolic dysfunction. Basal to mid inferior hypokinesis/akinesis 3. 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 4. Large PCWP V wave suggestive of potential ischemic MR 5. Severe systemic hypertension -The patient has ischemic  cardiomyopathy with an occluded RCA and very fine collaterals to the distal RCA branch vessels. I suspect that this is a subacute occlusion dating back to her initial onset discomfort a week ago. The occlusion appears to be consolidated and likely difficult to open at this time. Could consider CTO intervention in the future. - Patient has severe systemic hypertension with elevated LVEDP and wedge pressure needs to be treated. She likely also needs additional diuresis.   Echo 02/08/16: Study Conclusions  - Left ventricle: The cavity size was moderately to severely  dilated. There was mild concentric hypertrophy. Systolic function  was mildly to moderately reduced. The estimated ejection fraction  was in the range of 40% to 45%. Mild hypokinesis of the inferior  and inferoseptal myocardium. Features are consistent with a  pseudonormal left ventricular filling pattern, with concomitant  abnormal relaxation and increased filling pressure (grade 2  diastolic dysfunction). Doppler parameters are consistent with  elevated mean left atrial filling pressure.  - Aortic valve: There was no stenosis. There was no regurgitation.  - Mitral valve: Mobility of the anterior and posterior leaflet was  restricted. There was mild regurgitation. Effective regurgitant  orifice (PISA): 0.12 cm^2.  - Left atrium: The atrium was severely dilated.  - Right ventricle: The cavity size was normal. Wall thickness was  normal. Systolic function was normal.  - Pulmonary arteries: Systolic pressure was mildly increased. PA  peak pressure: 44 mm Hg (S).  - Inferior vena cava: The vessel was dilated. The respirophasic  diameter changes were in the normal range (>= 50%), consistent  with elevated central venous pressure.  - Pericardium, extracardiac: There was a left pleural effusion.    Past Medical History:  Diagnosis Date  . Allergy   . Arthritis    FINGERS  . Cancer [facility])    left  breast  . Cataract    right  . Crohn disease ([place])   . Depression   . Diabetes mellitus without complication ([place])    TYPE 2 , TAKING OTC MED FORGYMEMA  . GERD (gastroesophageal reflux disease)   . Headache    sinus  . Heart failure, acute, systolic and diastolic ([place]) MARCH 2992 DX  . History of colonic polyps   . Hypertension   . Hypothyroidism    per patient, takes OTC meds when temperature drops low  . IBS (irritable bowel syndrome)   . Seizures ([place])    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, 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  . Thyroid disease    AGE 63'S UNTIL  2016 OFF ALL THRYOID MEDS NOW   . Wears  dentures   . Wears glasses     Past Surgical History:  Procedure Laterality Date  . ABDOMINAL HYSTERECTOMY     PARTIAL  . BREAST SURGERY    . CARDIAC CATHETERIZATION  02/10/2016   Procedure: Right/Left Heart Cath and Coronary Angiography;  Surgeon: Leonie Man, MD;  Location: [place] CV LAB;  Service: Cardiovascular;;  . CATARACT EXTRACTION W/ INTRAOCULAR LENS IMPLANT     left  . COLONOSCOPY WITH PROPOFOL N/A 02/16/2017   Procedure: COLONOSCOPY WITH PROPOFOL;  Surgeon: Carol Ada, MD;  Location: [facility];  Service: Endoscopy;  Laterality: N/A;  . EP IMPLANTABLE DEVICE N/A 02/10/2016   Procedure: ICD Implant;  Surgeon: Will Meredith Leeds, MD;  Location: [place] CV LAB;  Service: Cardiovascular;  Laterality: N/A;  . ESOPHAGOGASTRODUODENOSCOPY (EGD) WITH PROPOFOL N/A 02/16/2017   Procedure: ESOPHAGOGASTRODUODENOSCOPY (EGD) WITH PROPOFOL;  Surgeon: Carol Ada, MD;  Location: [facility];  Service: Endoscopy;  Laterality: N/A;  reflux  . IR IMAGING GUIDED PORT INSERTION  04/27/2020  . TONSILLECTOMY      MEDICATIONS: . Ascorbic Acid (VITAMIN C PO)  . b complex vitamins tablet  . Beta Carotene (VITAMIN A) 25000 UNIT capsule  . CALCIUM PO  . Cholecalciferol (VITAMIN D3) 5000 units CAPS  . furosemide (LASIX) 20 MG tablet  . lidocaine-prilocaine (EMLA) cream  . Liver Extract (LIVER PO)   . losartan (COZAAR) 50 MG tablet  . MAGNESIUM PO  . Misc Natural Products (DUODENUM) TABS  . nitroGLYCERIN (NITROSTAT) 0.4 MG SL tablet  . ondansetron (ZOFRAN) 8 MG tablet  . OVER THE COUNTER MEDICATION  . OVER THE COUNTER MEDICATION  . OVER THE COUNTER MEDICATION  . PANTETHINE PO  . potassium chloride SA (K-DUR,KLOR-CON) 20 MEQ tablet  . prochlorperazine (COMPAZINE) 10 MG tablet  . sotalol (BETAPACE) 80 MG tablet  . vitamin E 400 UNIT capsule   No current facility-administered medications for this encounter.     Myra Gianotti, PA-C Surgical Short Stay/Anesthesiology [facility] Phone [phone] [facility] Phone [phone] 09/07/2020 5:50 PM

## 2020-09-07 NOTE — Anesthesia Preprocedure Evaluation (Addendum)
Anesthesia Evaluation  Patient identified by MRN, date of birth, ID band Patient awake    Reviewed: Allergy & Precautions, NPO status , Patient's Chart, lab work & pertinent test results  Airway Mallampati: III  TM Distance: >3 FB Neck ROM: Full    Dental  (+) Edentulous Upper, Missing   Pulmonary Current Smoker and Patient abstained from smoking.,    Pulmonary exam normal breath sounds clear to auscultation       Cardiovascular hypertension, Pt. on medications and Pt. on home beta blockers + CAD, + Peripheral Vascular Disease and +CHF  Normal cardiovascular exam Rhythm:Regular Rate:Normal     Neuro/Psych  Headaches, Seizures -, Well Controlled,  PSYCHIATRIC DISORDERS Depression    GI/Hepatic negative GI ROS, Neg liver ROS,   Endo/Other  diabetes  Renal/GU negative Renal ROS     Musculoskeletal negative musculoskeletal ROS (+)   Abdominal   Peds  Hematology negative hematology ROS (+)   Anesthesia Other Findings LEFT BREAST CANCER  Reproductive/Obstetrics                           Anesthesia Physical Anesthesia Plan  ASA: IV  Anesthesia Plan: General and Regional   Post-op Pain Management: GA combined w/ Regional for post-op pain   Induction: Intravenous  PONV Risk Score and Plan: 2 and Ondansetron, Dexamethasone, Propofol infusion and Treatment may vary due to age or medical condition  Airway Management Planned: LMA  Additional Equipment:   Intra-op Plan:   Post-operative Plan: Extubation in OR  Informed Consent: I have reviewed the patients History and Physical, chart, labs and discussed the procedure including the risks, benefits and alternatives for the proposed anesthesia with the patient or authorized representative who has indicated his/her understanding and acceptance.     Dental advisory given  Plan Discussed with: CRNA  Anesthesia Plan Comments: (PAT note written  09/07/2020 by Myra Gianotti, PA-C. Smoker, CAD (occluded RCA with very fine collaterals 2017), ischemic CM (LVEF 40-45% 02/08/16), SVT with RBBB vs VT not responsive to adenosine (s/p DCCV, LHC/ICD 2017; s/p ICD extraction for left breast radiation 08/11/20), DM2, left breast cancer (s/p chemo via right Port).  She is s/p single-chamber ICD lead extraction and device extraction on 08/11/20 due to need for breast surgery and radiation. She wears a LifeVest with plans to implant a new ICD following completion of radiation therapy. Anticipate use of defib pads while LifeVest off intra-operatively.   Has multiple allergies/sensitivites including Purell Instant Hand Sanitizer, Latex, contrast dye.)     Anesthesia Quick Evaluation

## 2020-09-10 ENCOUNTER — Other Ambulatory Visit (HOSPITAL_COMMUNITY)
Admission: RE | Admit: 2020-09-10 | Discharge: 2020-09-10 | Disposition: A | Discharge status: Home / Self Care | Payer: Medicare HMO | Source: Ambulatory Visit | Attending: Surgery | Admitting: Surgery

## 2020-09-10 DIAGNOSIS — Z01812 Encounter for preprocedural laboratory examination: Secondary | ICD-10-CM | POA: Diagnosis present

## 2020-09-10 DIAGNOSIS — Z20822 Contact with and (suspected) exposure to covid-19: Secondary | ICD-10-CM | POA: Insufficient documentation

## 2020-09-10 LAB — SARS CORONAVIRUS 2 (TAT 6-24 HRS): SARS Coronavirus 2: NEGATIVE

## 2020-09-14 ENCOUNTER — Encounter (HOSPITAL_COMMUNITY)
Admission: RE | Disposition: A | Discharge status: Home / Self Care | Payer: Self-pay | Source: Home / Self Care | Attending: Surgery

## 2020-09-14 ENCOUNTER — Ambulatory Visit (HOSPITAL_COMMUNITY): Payer: Medicare HMO | Admitting: Certified Registered Nurse Anesthetist

## 2020-09-14 ENCOUNTER — Encounter (HOSPITAL_COMMUNITY): Payer: Self-pay | Admitting: Surgery

## 2020-09-14 ENCOUNTER — Other Ambulatory Visit: Payer: Self-pay

## 2020-09-14 ENCOUNTER — Ambulatory Visit (HOSPITAL_COMMUNITY): Payer: Medicare HMO | Admitting: Vascular Surgery

## 2020-09-14 ENCOUNTER — Observation Stay (HOSPITAL_COMMUNITY)
Admission: RE | Admit: 2020-09-14 | Discharge: 2020-09-15 | Disposition: A | Discharge status: Home / Self Care | Payer: Medicare HMO | Attending: Surgery | Admitting: Surgery

## 2020-09-14 DIAGNOSIS — Z7982 Long term (current) use of aspirin: Secondary | ICD-10-CM | POA: Diagnosis not present

## 2020-09-14 DIAGNOSIS — C50912 Malignant neoplasm of unspecified site of left female breast: Secondary | ICD-10-CM | POA: Diagnosis present

## 2020-09-14 HISTORY — PX: MASTECTOMY MODIFIED RADICAL: SHX5962

## 2020-09-14 LAB — GLUCOSE, CAPILLARY: Glucose-Capillary: 147 mg/dL — ABNORMAL HIGH (ref 70–99)

## 2020-09-14 SURGERY — MASTECTOMY, MODIFIED RADICAL
Anesthesia: Regional | Site: Breast | Laterality: Left

## 2020-09-14 MED ORDER — ONDANSETRON HCL 4 MG/2ML IJ SOLN: 4.0000 mg | Freq: Once | INTRAMUSCULAR | Status: DC | PRN

## 2020-09-14 MED ORDER — LIDOCAINE 2% (20 MG/ML) 5 ML SYRINGE
INTRAMUSCULAR | Status: AC
  Filled 2020-09-14: qty 5

## 2020-09-14 MED ORDER — METOPROLOL TARTRATE 5 MG/5ML IV SOLN: 5.0000 mg | Freq: Four times a day (QID) | INTRAVENOUS | Status: DC | PRN

## 2020-09-14 MED ORDER — CEFAZOLIN SODIUM-DEXTROSE 2-4 GM/100ML-% IV SOLN
2.0000 g | INTRAVENOUS | Status: AC
  Administered 2020-09-14: 2 g via INTRAVENOUS
  Filled 2020-09-14: qty 100

## 2020-09-14 MED ORDER — DEXAMETHASONE SODIUM PHOSPHATE 10 MG/ML IJ SOLN
INTRAMUSCULAR | Status: DC | PRN
  Administered 2020-09-14: 5 mg via INTRAVENOUS

## 2020-09-14 MED ORDER — METHOCARBAMOL 500 MG PO TABS: 500.0000 mg | ORAL_TABLET | Freq: Four times a day (QID) | ORAL | Status: DC | PRN

## 2020-09-14 MED ORDER — FUROSEMIDE 20 MG PO TABS
20.0000 mg | ORAL_TABLET | Freq: Every day | ORAL | Status: DC
  Administered 2020-09-14 – 2020-09-15 (×2): 20 mg via ORAL
  Filled 2020-09-14 (×2): qty 1

## 2020-09-14 MED ORDER — ONDANSETRON HCL 4 MG/2ML IJ SOLN
INTRAMUSCULAR | Status: DC | PRN
  Administered 2020-09-14: 4 mg via INTRAVENOUS

## 2020-09-14 MED ORDER — ENOXAPARIN SODIUM 40 MG/0.4ML ~~LOC~~ SOLN
40.0000 mg | SUBCUTANEOUS | Status: DC
  Administered 2020-09-15: 40 mg via SUBCUTANEOUS
  Filled 2020-09-14: qty 0.4

## 2020-09-14 MED ORDER — ROPIVACAINE HCL 5 MG/ML IJ SOLN
INTRAMUSCULAR | Status: DC | PRN
  Administered 2020-09-14: 30 mL via PERINEURAL

## 2020-09-14 MED ORDER — FENTANYL CITRATE (PF) 250 MCG/5ML IJ SOLN
INTRAMUSCULAR | Status: DC | PRN
Start: 2020-09-14 — End: 2020-09-14
  Administered 2020-09-14: 50 ug via INTRAVENOUS
  Administered 2020-09-14: 25 ug via INTRAVENOUS

## 2020-09-14 MED ORDER — FENTANYL CITRATE (PF) 100 MCG/2ML IJ SOLN
INTRAMUSCULAR | Status: AC
  Filled 2020-09-14: qty 2

## 2020-09-14 MED ORDER — POTASSIUM CHLORIDE 20 MEQ PO PACK
20.0000 meq | PACK | Freq: Two times a day (BID) | ORAL | Status: DC
  Administered 2020-09-15 (×2): 20 meq via ORAL
  Filled 2020-09-14 (×2): qty 1

## 2020-09-14 MED ORDER — HEMOSTATIC AGENTS (NO CHARGE) OPTIME
TOPICAL | Status: DC | PRN
  Administered 2020-09-14: 1 via TOPICAL

## 2020-09-14 MED ORDER — SOTALOL HCL 80 MG PO TABS
80.0000 mg | ORAL_TABLET | Freq: Two times a day (BID) | ORAL | Status: DC
  Administered 2020-09-14 – 2020-09-15 (×2): 80 mg via ORAL
  Filled 2020-09-14 (×4): qty 1

## 2020-09-14 MED ORDER — CHLORHEXIDINE GLUCONATE CLOTH 2 % EX PADS: 6.0000 | MEDICATED_PAD | Freq: Once | CUTANEOUS | Status: DC

## 2020-09-14 MED ORDER — FENTANYL CITRATE (PF) 100 MCG/2ML IJ SOLN: 50.0000 ug | Freq: Once | INTRAMUSCULAR | Status: DC

## 2020-09-14 MED ORDER — ONDANSETRON HCL 4 MG/2ML IJ SOLN: 4.0000 mg | Freq: Four times a day (QID) | INTRAMUSCULAR | Status: DC | PRN

## 2020-09-14 MED ORDER — CLONIDINE HCL (ANALGESIA) 100 MCG/ML EP SOLN
EPIDURAL | Status: DC | PRN
  Administered 2020-09-14: 100 ug

## 2020-09-14 MED ORDER — PROPOFOL 10 MG/ML IV BOLUS
INTRAVENOUS | Status: DC | PRN
  Administered 2020-09-14: 40 mg via INTRAVENOUS
  Administered 2020-09-14: 200 mg via INTRAVENOUS

## 2020-09-14 MED ORDER — ONDANSETRON HCL 4 MG/2ML IJ SOLN
INTRAMUSCULAR | Status: AC
  Filled 2020-09-14: qty 2

## 2020-09-14 MED ORDER — PHENYLEPHRINE 40 MCG/ML (10ML) SYRINGE FOR IV PUSH (FOR BLOOD PRESSURE SUPPORT)
PREFILLED_SYRINGE | INTRAVENOUS | Status: DC | PRN
  Administered 2020-09-14: 40 ug via INTRAVENOUS

## 2020-09-14 MED ORDER — EPHEDRINE 5 MG/ML INJ
INTRAVENOUS | Status: AC
  Filled 2020-09-14: qty 10

## 2020-09-14 MED ORDER — PROPOFOL 10 MG/ML IV BOLUS
INTRAVENOUS | Status: AC
  Filled 2020-09-14: qty 20

## 2020-09-14 MED ORDER — CEFAZOLIN SODIUM-DEXTROSE 2-4 GM/100ML-% IV SOLN
2.0000 g | Freq: Three times a day (TID) | INTRAVENOUS | Status: AC
  Administered 2020-09-14: 2 g via INTRAVENOUS
  Filled 2020-09-14: qty 100

## 2020-09-14 MED ORDER — LACTATED RINGERS IV SOLN: INTRAVENOUS | Status: DC

## 2020-09-14 MED ORDER — DEXTROSE-NACL 5-0.9 % IV SOLN: INTRAVENOUS | Status: DC

## 2020-09-14 MED ORDER — LOSARTAN POTASSIUM 50 MG PO TABS
50.0000 mg | ORAL_TABLET | Freq: Every day | ORAL | Status: DC
  Administered 2020-09-14 – 2020-09-15 (×2): 50 mg via ORAL
  Filled 2020-09-14 (×2): qty 1

## 2020-09-14 MED ORDER — 0.9 % SODIUM CHLORIDE (POUR BTL) OPTIME
TOPICAL | Status: DC | PRN
  Administered 2020-09-14: 1000 mL

## 2020-09-14 MED ORDER — FENTANYL CITRATE (PF) 250 MCG/5ML IJ SOLN
INTRAMUSCULAR | Status: AC
  Filled 2020-09-14: qty 5

## 2020-09-14 MED ORDER — PHENYLEPHRINE HCL-NACL 10-0.9 MG/250ML-% IV SOLN
INTRAVENOUS | Status: DC | PRN
  Administered 2020-09-14: 25 ug/min via INTRAVENOUS

## 2020-09-14 MED ORDER — ACETAMINOPHEN 500 MG PO TABS
1000.0000 mg | ORAL_TABLET | ORAL | Status: AC
  Administered 2020-09-14: 1000 mg via ORAL
  Filled 2020-09-14: qty 2

## 2020-09-14 MED ORDER — ONDANSETRON 4 MG PO TBDP: 4.0000 mg | ORAL_TABLET | Freq: Four times a day (QID) | ORAL | Status: DC | PRN

## 2020-09-14 MED ORDER — MIDAZOLAM HCL 2 MG/2ML IJ SOLN: 1.0000 mg | Freq: Once | INTRAMUSCULAR | Status: AC

## 2020-09-14 MED ORDER — EPHEDRINE SULFATE-NACL 50-0.9 MG/10ML-% IV SOSY
PREFILLED_SYRINGE | INTRAVENOUS | Status: DC | PRN
  Administered 2020-09-14: 5 mg via INTRAVENOUS

## 2020-09-14 MED ORDER — GABAPENTIN 300 MG PO CAPS
300.0000 mg | ORAL_CAPSULE | ORAL | Status: AC
  Administered 2020-09-14: 300 mg via ORAL
  Filled 2020-09-14: qty 1

## 2020-09-14 MED ORDER — TRAMADOL HCL 50 MG PO TABS
50.0000 mg | ORAL_TABLET | Freq: Four times a day (QID) | ORAL | Status: DC | PRN
  Administered 2020-09-15 (×2): 50 mg via ORAL
  Filled 2020-09-14 (×2): qty 1

## 2020-09-14 MED ORDER — FENTANYL CITRATE (PF) 100 MCG/2ML IJ SOLN: 25.0000 ug | INTRAMUSCULAR | Status: DC | PRN

## 2020-09-14 MED ORDER — PHENYLEPHRINE 40 MCG/ML (10ML) SYRINGE FOR IV PUSH (FOR BLOOD PRESSURE SUPPORT)
PREFILLED_SYRINGE | INTRAVENOUS | Status: AC
  Filled 2020-09-14: qty 10

## 2020-09-14 MED ORDER — MIDAZOLAM HCL 2 MG/2ML IJ SOLN
INTRAMUSCULAR | Status: AC
  Administered 2020-09-14: 1 mg via INTRAVENOUS
  Filled 2020-09-14: qty 2

## 2020-09-14 MED ORDER — HYDROMORPHONE HCL 1 MG/ML IJ SOLN: 1.0000 mg | INTRAMUSCULAR | Status: DC | PRN

## 2020-09-14 MED ORDER — LIDOCAINE 2% (20 MG/ML) 5 ML SYRINGE
INTRAMUSCULAR | Status: DC | PRN
  Administered 2020-09-14: 40 mg via INTRAVENOUS

## 2020-09-14 MED ORDER — NITROGLYCERIN 0.4 MG SL SUBL: 0.4000 mg | SUBLINGUAL_TABLET | SUBLINGUAL | Status: DC | PRN

## 2020-09-14 SURGICAL SUPPLY — 41 items
APPLIER CLIP 9.375 MED OPEN (MISCELLANEOUS) ×4
BINDER BREAST LRG (GAUZE/BANDAGES/DRESSINGS) ×2 IMPLANT
BINDER BREAST XLRG (GAUZE/BANDAGES/DRESSINGS) IMPLANT
BIOPATCH RED 1 DISK 7.0 (GAUZE/BANDAGES/DRESSINGS) ×4 IMPLANT
CANISTER SUCT 3000ML PPV (MISCELLANEOUS) ×2 IMPLANT
CLIP APPLIE 9.375 MED OPEN (MISCELLANEOUS) ×2 IMPLANT
COVER SURGICAL LIGHT HANDLE (MISCELLANEOUS) ×2 IMPLANT
DERMABOND ADVANCED (GAUZE/BANDAGES/DRESSINGS) ×1
DERMABOND ADVANCED .7 DNX12 (GAUZE/BANDAGES/DRESSINGS) ×1 IMPLANT
DRAIN CHANNEL 19F RND (DRAIN) ×4 IMPLANT
DRAPE LAPAROSCOPIC ABDOMINAL (DRAPES) ×2 IMPLANT
DRSG PAD ABDOMINAL 8X10 ST (GAUZE/BANDAGES/DRESSINGS) ×4 IMPLANT
DRSG TEGADERM 4X4.75 (GAUZE/BANDAGES/DRESSINGS) ×4 IMPLANT
ELECT REM PT RETURN 9FT ADLT (ELECTROSURGICAL) ×2
ELECTRODE REM PT RTRN 9FT ADLT (ELECTROSURGICAL) ×1 IMPLANT
EVACUATOR SILICONE 100CC (DRAIN) ×4 IMPLANT
GAUZE SPONGE 4X4 12PLY STRL (GAUZE/BANDAGES/DRESSINGS) ×2 IMPLANT
GAUZE SPONGE 4X4 12PLY STRL LF (GAUZE/BANDAGES/DRESSINGS) ×2 IMPLANT
GLOVE BIO SURGEON STRL SZ8 (GLOVE) IMPLANT
GLOVE BIOGEL PI IND STRL 8 (GLOVE) ×1 IMPLANT
GLOVE BIOGEL PI INDICATOR 8 (GLOVE) ×1
GLOVE SURG SYN 8.0 (GLOVE) ×4 IMPLANT
GOWN STRL REUS W/ TWL LRG LVL3 (GOWN DISPOSABLE) ×3 IMPLANT
GOWN STRL REUS W/ TWL XL LVL3 (GOWN DISPOSABLE) ×1 IMPLANT
GOWN STRL REUS W/TWL LRG LVL3 (GOWN DISPOSABLE) ×6
GOWN STRL REUS W/TWL XL LVL3 (GOWN DISPOSABLE) ×2
HEMOSTAT HEMOBLAST BELLOWS (HEMOSTASIS) ×2 IMPLANT
KIT BASIN OR (CUSTOM PROCEDURE TRAY) ×2 IMPLANT
KIT TURNOVER KIT B (KITS) ×2 IMPLANT
NS IRRIG 1000ML POUR BTL (IV SOLUTION) ×2 IMPLANT
PACK GENERAL/GYN (CUSTOM PROCEDURE TRAY) ×2 IMPLANT
PAD ARMBOARD 7.5X6 YLW CONV (MISCELLANEOUS) ×2 IMPLANT
PENCIL SMOKE EVACUATOR (MISCELLANEOUS) ×2 IMPLANT
SPECIMEN JAR X LARGE (MISCELLANEOUS) ×2 IMPLANT
SPONGE LAP 18X18 RF (DISPOSABLE) IMPLANT
SUT ETHILON 3 0 FSL (SUTURE) ×6 IMPLANT
SUT MNCRL AB 4-0 PS2 18 (SUTURE) ×2 IMPLANT
SUT SILK 2 0 SH (SUTURE) ×2 IMPLANT
SUT VIC AB 3-0 SH 18 (SUTURE) ×2 IMPLANT
TOWEL GREEN STERILE (TOWEL DISPOSABLE) ×2 IMPLANT
TOWEL GREEN STERILE FF (TOWEL DISPOSABLE) ×2 IMPLANT

## 2020-09-14 NOTE — Op Note (Signed)
Left  Modified Radical Mastectomy Procedure Note  Indications: This patient presents for surgical treatment of Breast Cancer.  Patient is completed chemotherapy for inflammatory left breast cancer with a good result.  She presents for left modified radical mastectomy.The surgical and non surgical options have been discussed with the patient.  Risks of surgery include bleeding,  Infection,  Flap necrosis,  Tissue loss,  Chronic pain, death, Numbness,  And the need for additional procedures.  Reconstruction options also have been discussed with the patient as well.  The patient agrees to proceed.  Pre-operative Diagnosis: left breast cancer  Post-operative Diagnosis: left breast cancer  Surgeon: Turner Daniels  MD   Assistants: Dr Debera Lat MD   Anesthesia: General LMA anesthesia and PEC BLOCK   ASA Class: 2  Procedure Details  The patient was seen again in the Holding Room. The risks, benefits, indications, potential complications, treatment options, and expected outcomes were discussed with the patient. The possibilities of reaction to medication, pulmonary aspiration, bleeding, recurrent infection, the need for additional procedures, failure to diagnose a condition, and creating a complication requiring transfusion or further operation were discussed with the patient. The patient and/or family concurred with the proposed plan, giving informed consent. The site of surgery was properly noted/marked. The patient was taken to the Operating Room, identified as AUBURN HESTER and the procedure verified as left modified radical mastectomy. A Time Out was held and the above information confirmed.   The patient was placed supine and general anesthetic was administered. The left arm, left breast, and chest were prepped and draped in standard fashion. An oblique elliptical incision was made encompassing the nipple. Skin flaps were created meticulously to preserve the subdermal blood supply and maintain  a clear margin of resection around the tumor. Dissection was carried down to the pectoralis fascia, which was included with the specimen, and an axillary dissection was performed in continuity with the mastectomy. This included levels I and II. This was accomplished by exposing the axillary vein anteriorly and inferiorly to the level of the pectoralis minor and laterally. Small venous tributaries, lymphatics, and vessels were clipped and ligated or cauterized and divided. The subscapularis muscle was skeletonized. The long thoracic and thoracodorsal neurovascular bundles were identified and preserved.    The specimen was submitted to pathology. The wound was irrigated.   Hemoblast was applied to the axilla and chest wall.  Two J-P drains were placed.One was placed within the lower flap and second was placed laterally in the axillary dissection region. The skin incision was closed in layers with a 4-0 Vicryl subcuticular closure. The drains were secured with 2-0 nylon sutures. Steri-Strips were applied along with a dry bulky gauze dressing.  Instrument, sponge, and needle counts were correct at closure and at the conclusion of the case.   Findings: as above   Estimated Blood Loss: less than 50 mL         Drains: Two JP drains placed as above         Total IV Fluids: Per anesthesia record         Specimens: Left breast with axillary contents   Complications: None; patient tolerated the procedure well.         Disposition: PACU - hemodynamically stable.         Condition: stable

## 2020-09-14 NOTE — Anesthesia Postprocedure Evaluation (Signed)
Anesthesia Post Note  Patient: Colleen Lowery  Procedure(s) Performed: LEFT MASTECTOMY MODIFIED RADICAL (Left Breast)     Patient location during evaluation: PACU Anesthesia Type: Regional and General Level of consciousness: awake Pain management: pain level controlled Vital Signs Assessment: post-procedure vital signs reviewed and stable Respiratory status: spontaneous breathing, nonlabored ventilation, respiratory function stable and patient connected to nasal cannula oxygen Cardiovascular status: blood pressure returned to baseline and stable Postop Assessment: no apparent nausea or vomiting Anesthetic complications: no   No complications documented.  Last Vitals:  Vitals:   09/14/20 1531 09/14/20 1740  BP: (!) 122/92 (!) 168/90  Pulse: 68 61  Resp: 16 20  Temp: 36.6 C   SpO2: 100%     Last Pain:  Vitals:   09/14/20 1531  TempSrc: Oral  PainSc: 0-No pain                 Dlisa Barnwell P Ranie Chinchilla

## 2020-09-14 NOTE — Anesthesia Procedure Notes (Signed)
Anesthesia Regional Block: Pectoralis block   Pre-Anesthetic Checklist: ,, timeout performed, Correct Patient, Correct Site, Correct Laterality, Correct Procedure, Correct Position, site marked, Risks and benefits discussed,  Surgical consent,  Pre-op evaluation,  At surgeon's request and post-op pain management  Laterality: Left  Prep: chloraprep       Needles:  Injection technique: Single-shot  Needle Type: Echogenic Stimulator Needle     Needle Length: 10cm  Needle Gauge: 20     Additional Needles:   Procedures:,,,, ultrasound used (permanent image in chart),,,,  Narrative:  Start time: 09/14/2020 9:50 AM End time: 09/14/2020 10:00 AM Injection made incrementally with aspirations every 5 mL.  Performed by: Personally  Anesthesiologist: Murvin Natal, MD  Additional Notes: Functioning IV was confirmed and monitors were applied.  A timeout was performed. Sterile prep, hand hygiene and sterile gloves were used. A 149m 20ga BBraun echogenic stimulator needle was used. Negative aspiration and negative test dose prior to incremental administration of local anesthetic. The patient tolerated the procedure well.  Ultrasound guidance: relevent anatomy identified, needle position confirmed, local anesthetic spread visualized around nerve(s), vascular puncture avoided.  Image printed for medical record.

## 2020-09-14 NOTE — Transfer of Care (Signed)
Immediate Anesthesia Transfer of Care Note  Patient: Colleen Lowery  Procedure(s) Performed: LEFT MASTECTOMY MODIFIED RADICAL (Left Breast)  Patient Location: PACU  Anesthesia Type:General  Level of Consciousness: drowsy  Airway & Oxygen Therapy: Patient Spontanous Breathing and Patient connected to face mask oxygen  Post-op Assessment: Report given to RN and Post -op Vital signs reviewed and stable  Post vital signs: Reviewed and stable  Last Vitals:  Vitals Value Taken Time  BP 168/81 09/14/20 1215  Temp    Pulse 55 09/14/20 1216  Resp 12 09/14/20 1216  SpO2 99 % 09/14/20 1216  Vitals shown include unvalidated device data.  Last Pain:  Vitals:   09/14/20 0859  TempSrc:   PainSc: 0-No pain         Complications: No complications documented.

## 2020-09-14 NOTE — Anesthesia Procedure Notes (Signed)
Procedure Name: LMA Insertion Date/Time: 09/14/2020 10:23 AM Performed by: Reece Agar, CRNA Pre-anesthesia Checklist: Patient identified, Emergency Drugs available, Suction available and Patient being monitored Patient Re-evaluated:Patient Re-evaluated prior to induction Oxygen Delivery Method: Circle System Utilized Preoxygenation: Pre-oxygenation with 100% oxygen Induction Type: IV induction Ventilation: Mask ventilation without difficulty LMA: LMA inserted LMA Size: 4.0 Number of attempts: 1 Placement Confirmation: positive ETCO2 Tube secured with: Tape Dental Injury: Teeth and Oropharynx as per pre-operative assessment

## 2020-09-14 NOTE — H&P (Signed)
[place] Appointment: 08/09/2020 11:30 AM Location: [facility] Patient #: [id number] DOB: [birth year] Single / Language: Colleen Lowery / Race: Black or African American Female  History of Present Illness Colleen Moores A. Farhad Burleson MD; 08/09/2020 12:17 PM) Patient words: Colleen Lowery is a 79 y.o. with above-mentioned history of left breast cancer currently on neoadjuvant chemotherapy with CM[facility]. She is enrolled chemotherapy and has completed all 6 cycles. 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 Clinically she's had a good response and feels relatively well considering where she's been through. She understands she'll need a left modified radical mastectomy. Other than for generalized fatigue and weakness, she denies any nausea or vomiting.                Malignant neoplasm of upper-outer quadrant of left breast in female, estrogen receptor positive ([place]) 03/22/2020:diffuse left breast swelling. skin thickening and two areas of irregular hypoechogenicity in the 2-3 o'clock region, 4.9cm, and at least 4 abnormal lymph nodes with cortical thickening. 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  The patient is a 79 year old female.   Medication History (Colleen Lowery, CMA; 08/09/2020 11:39 AM) Aspirin Low Dose (81MG Tablet DR, Oral) Active. Furosemide (20MG Tablet, Oral) Active. Losartan Potassium (50MG Tablet, Oral) Active. Potassium Chloride Crys ER (20MEQ Tablet ER, Oral) Active. Sotalol HCl (80MG Tablet, Oral) Active. Medications Reconciled    Vitals (Colleen Lowery CMA; 08/09/2020 11:38 AM) 08/09/2020 11:38 AM Weight: 140.38 lb Height: 62in Body Surface Area: 1.64 m Body Mass Index: 25.67  kg/m  Temp.: 97.75F  Pulse: 72 (Regular)  P.OX: 99% (Room air) BP: 140/80(Sitting, Left Arm, Standard)        Physical Exam (Colleen Reinig A. Vandella Ord MD; 08/09/2020 12:18 PM)  General Mental Status-Alert. General Appearance-Consistent with stated age. Hydration-Well hydrated. Voice-Normal.  Head and Neck Head-normocephalic, atraumatic with no lesions or palpable masses. Trachea-midline. Thyroid Gland Characteristics - normal size and consistency.  Chest and Lung Exam Note: Thickness the left breast is much less today. No masses are palpated. Left axilla shows no adenopathy. Left chest defibrillator in place. Right breast is normal. Right port is intact.  Cardiovascular Cardiovascular examination reveals -normal heart sounds, regular rate and rhythm with no murmurs and normal pedal pulses bilaterally.  Neurologic Neurologic evaluation reveals -alert and oriented x 3 with no impairment of recent or remote memory. Mental Status-Normal.  Lymphatic Head & Neck  General Head & Neck Lymphatics: Bilateral - Description - Normal. Axillary  General Axillary Region: Bilateral - Description - Normal. Tenderness - Non Tender.    Assessment & Plan (Colleen Kushnir A. Rashaad Hallstrom MD; 08/09/2020 12:19 PM)  INFLAMMATORY BREAST CANCER, LEFT (C50.912) Impression: finished neoadjuvant chemotherapy with a good result needs left MRM Discussed treatment options for breast cancer to include breast conservation vs mastectomy with reconstruction. Pt has decided on mastectomy. Risk include bleeding, infection, flap necrosis, pain, numbness, recurrence, hematoma, other surgery needs. Pt understands and agrees to proceed. She is scheduled to have her generator removed from the left and right side since she will need postoperative radiation therapy. I will schedule surgery about a month out since that is to be done 2 days from now. total time 45 minutes  Current  Plans Pt Education - [facility] Mastectomy HCI Pt Education -  ABC (After Breast Cancer) Class Info: discussed with patient and provided information.

## 2020-09-14 NOTE — Progress Notes (Signed)
Pt arrived to 4e from PACU. Pt oriented to room and staff. Vitals obtained. Telemetry box applied and CCMD notified x2 verifiers. 2 JP drains present to left chest. Minimal drainage present. Pt arrived from home with lifevest on. Pt educated that we are monitoring her HR and rhythm with our monitor and the lifevest was not needed. Pt understands, however she would like to keep it on.

## 2020-09-14 NOTE — Progress Notes (Signed)
Life vest removed with Dr. Roanna Banning.  Pads placed and connected to zoll.

## 2020-09-14 NOTE — Interval H&P Note (Signed)
History and Physical Interval Note:  09/14/2020 9:14 AM  Colleen Lowery  has presented today for surgery, with the diagnosis of LEFT BREAST CANCER.  The various methods of treatment have been discussed with the patient and family. After consideration of risks, benefits and other options for treatment, the patient has consented to  Procedure(s) with comments: LEFT MASTECTOMY MODIFIED RADICAL (Left) - PEC BLOCK as a surgical intervention.  The patient's history has been reviewed, patient examined, no change in status, stable for surgery.  I have reviewed the patient's chart and labs.  Questions were answered to the patient's satisfaction.     Turner Daniels MD

## 2020-09-15 ENCOUNTER — Encounter (HOSPITAL_COMMUNITY): Payer: Self-pay | Admitting: Surgery

## 2020-09-15 DIAGNOSIS — C50912 Malignant neoplasm of unspecified site of left female breast: Secondary | ICD-10-CM | POA: Diagnosis not present

## 2020-09-15 LAB — COMPREHENSIVE METABOLIC PANEL
ALT: 10 U/L (ref 0–44)
AST: 15 U/L (ref 15–41)
Albumin: 2.4 g/dL — ABNORMAL LOW (ref 3.5–5.0)
Alkaline Phosphatase: 70 U/L (ref 38–126)
Anion gap: 9 (ref 5–15)
BUN: 12 mg/dL (ref 8–23)
CO2: 17 mmol/L — ABNORMAL LOW (ref 22–32)
Calcium: 7.2 mg/dL — ABNORMAL LOW (ref 8.9–10.3)
Chloride: 114 mmol/L — ABNORMAL HIGH (ref 98–111)
Creatinine, Ser: 0.78 mg/dL (ref 0.44–1.00)
GFR, Estimated: >60 mL/min (ref >60)
Glucose, Bld: 836 mg/dL (ref 70–99)
Potassium: 3.7 mmol/L (ref 3.5–5.1)
Sodium: 140 mmol/L (ref 135–145)
Total Bilirubin: 0.5 mg/dL (ref 0.3–1.2)
Total Protein: 4.7 g/dL — ABNORMAL LOW (ref 6.5–8.1)

## 2020-09-15 LAB — COMPREHENSIVE METABOLIC PANEL WITH GFR
ALT: 10 U/L (ref 0–44)
AST: 18 U/L (ref 15–41)
Albumin: 3.1 g/dL — ABNORMAL LOW (ref 3.5–5.0)
Alkaline Phosphatase: 85 U/L (ref 38–126)
Anion gap: 9 (ref 5–15)
BUN: 13 mg/dL (ref 8–23)
CO2: 23 mmol/L (ref 22–32)
Calcium: 8.8 mg/dL — ABNORMAL LOW (ref 8.9–10.3)
Chloride: 106 mmol/L (ref 98–111)
Creatinine, Ser: 0.84 mg/dL (ref 0.44–1.00)
GFR, Estimated: >60 mL/min
Glucose, Bld: 126 mg/dL — ABNORMAL HIGH (ref 70–99)
Potassium: 4.7 mmol/L (ref 3.5–5.1)
Sodium: 138 mmol/L (ref 135–145)
Total Bilirubin: 0.6 mg/dL (ref 0.3–1.2)
Total Protein: 5.8 g/dL — ABNORMAL LOW (ref 6.5–8.1)

## 2020-09-15 LAB — CBC
HCT: 32.5 % — ABNORMAL LOW (ref 36.0–46.0)
HCT: 36.8 % (ref 36.0–46.0)
Hemoglobin: 10.7 g/dL — ABNORMAL LOW (ref 12.0–15.0)
Hemoglobin: 12.3 g/dL (ref 12.0–15.0)
MCH: 32.1 pg (ref 26.0–34.0)
MCH: 31.6 pg (ref 26.0–34.0)
MCHC: 32.9 g/dL (ref 30.0–36.0)
MCHC: 33.4 g/dL (ref 30.0–36.0)
MCV: 97.6 fL (ref 80.0–100.0)
MCV: 94.6 fL (ref 80.0–100.0)
Platelets: 188 K/uL (ref 150–400)
Platelets: 209 10*3/uL (ref 150–400)
RBC: 3.33 MIL/uL — ABNORMAL LOW (ref 3.87–5.11)
RBC: 3.89 MIL/uL (ref 3.87–5.11)
RDW: 13 % (ref 11.5–15.5)
RDW: 12.9 % (ref 11.5–15.5)
WBC: 8.6 K/uL (ref 4.0–10.5)
WBC: 10.1 10*3/uL (ref 4.0–10.5)
nRBC: 0 % (ref 0.0–0.2)
nRBC: 0 % (ref 0.0–0.2)

## 2020-09-15 LAB — GLUCOSE, CAPILLARY: Glucose-Capillary: 131 mg/dL — ABNORMAL HIGH (ref 70–99)

## 2020-09-15 MED ORDER — TRAMADOL HCL 50 MG PO TABS: 50.0000 mg | ORAL_TABLET | Freq: Four times a day (QID) | ORAL | 0 refills | Status: DC | PRN

## 2020-09-15 MED ORDER — METHOCARBAMOL 500 MG PO TABS
500.0000 mg | ORAL_TABLET | Freq: Four times a day (QID) | ORAL | 0 refills | Status: DC | PRN
Start: 2020-09-15 — End: 2022-01-24

## 2020-09-15 NOTE — Discharge Instructions (Signed)
[facility] [place] [phone]  MASTECTOMY: POST OP INSTRUCTIONS  Always review your discharge instruction sheet given to you by the facility where your surgery was performed. IF YOU HAVE DISABILITY OR FAMILY LEAVE FORMS, YOU MUST BRING THEM TO THE OFFICE FOR PROCESSING.   DO NOT GIVE THEM TO YOUR DOCTOR. A prescription for pain medication may be given to you upon discharge.  Take your pain medication as prescribed, if needed.  If narcotic pain medicine is not needed, then you may take acetaminophen (Tylenol) or ibuprofen (Advil) as needed. 1. Take your usually prescribed medications unless otherwise directed. 2. If you need a refill on your pain medication, please contact your pharmacy.  They will contact our office to request authorization.  Prescriptions will not be filled after 5pm or on week-ends. 3. You should follow a light diet the first few days after arrival home, such as soup and crackers, etc.  Resume your normal diet the day after surgery. 4. Most patients will experience some swelling and bruising on the chest and underarm.  Ice packs will help.  Swelling and bruising can take several days to resolve.  5. It is common to experience some constipation if taking pain medication after surgery.  Increasing fluid intake and taking a stool softener (such as Colace) will usually help or prevent this problem from occurring.  A mild laxative (Milk of Magnesia or Miralax) should be taken according to package instructions if there are no bowel movements after 48 hours. 6. Unless discharge instructions indicate otherwise, leave your bandage dry and in place until your next appointment in 3-5 days.  You may take a limited sponge bath.  No tube baths or showers until the drains are removed.  You may have steri-strips (small skin tapes) in place directly over the incision.  These strips should be left on the skin for 7-10 days.  If your surgeon used skin glue on the incision, you may  shower in 24 hours.  The glue will flake off over the next 2-3 weeks.  Any sutures or staples will be removed at the office during your follow-up visit. 7. DRAINS:  If you have drains in place, it is important to keep a list of the amount of drainage produced each day in your drains.  Before leaving the hospital, you should be instructed on drain care.  Call our office if you have any questions about your drains. 8. ACTIVITIES:  You may resume regular (light) daily activities beginning the next day--such as daily self-care, walking, climbing stairs--gradually increasing activities as tolerated.  You may have sexual intercourse when it is comfortable.  Refrain from any heavy lifting or straining until approved by your doctor. a. You may drive when you are no longer taking prescription pain medication, you can comfortably wear a seatbelt, and you can safely maneuver your car and apply brakes. b. RETURN TO WORK:  __________________________________________________________ 9. You should see your doctor in the office for a follow-up appointment approximately 3-5 days after your surgery.  Your doctor's nurse will typically make your follow-up appointment when she calls you with your pathology report.  Expect your pathology report 2-3 business days after your surgery.  You may call to check if you do not hear from [place] after three days.   10. OTHER INSTRUCTIONS: ______________________________________________________________________________________________ ____________________________________________________________________________________________ WHEN TO CALL YOUR DOCTOR: 1. Fever over 101.0 2. Nausea and/or vomiting 3. Extreme swelling or bruising 4. Continued bleeding from incision. 5. Increased pain, redness, or drainage from the incision.  The clinic staff is available to answer your questions during regular business hours.  Please don't hesitate to call and ask to speak to one of the nurses for clinical  concerns.  If you have a medical emergency, go to the nearest emergency room or call 911.  A surgeon from [facility] is always on call at the hospital. [address], [place], [place], [place]  [address] ? [address], [place], [place]   [address] [phone] ? [phone] ? FAX [phone] Web site: [place] Surgical drains are used to remove extra fluid that normally builds up in a surgical wound after surgery. A surgical drain helps to heal a surgical wound. Different kinds of surgical drains include:  Active drains. These drains use suction to pull drainage away from the surgical wound. Drainage flows through a tube to a container outside of the body. With these drains, you need to keep the bulb or the drainage container flat (compressed) at all times, except while you empty it. Flattening the bulb or container creates suction.  Passive drains. These drains allow fluid to drain naturally, by gravity. Drainage flows through a tube to a bandage (dressing) or a container outside of the body. Passive drains do not need to be emptied. A drain is placed during surgery. Right after surgery, drainage is usually bright red and a little thicker than water. The drainage may gradually turn yellow or pink and become thinner. It is likely that your health care provider will remove the drain when the drainage stops or when the amount decreases to 1-2 Tbsp (15-30 mL) during a 24-hour period. Supplies needed:  Tape.  Germ-free cleaning solution (sterile saline).  Cotton swabs.  Split gauze drain sponge: 4 x 4 inches (10 x 10 cm).  Gauze square: 4 x 4 inches (10 x 10 cm). How to care for your surgical drain Care for your drain as told by your health care provider. This is important to help prevent infection. If your drain is placed at your back, or any other hard-to-reach area, ask another person to assist you in performing the following tasks: General  care  Keep the skin around the drain dry and covered with a dressing at all times.  Check your drain area every day for signs of infection. Check for: ? Redness, swelling, or pain. ? Pus or a bad smell. ? Cloudy drainage. ? Tenderness or pressure at the drain exit site. Changing the dressing Follow instructions from your health care provider about how to change your dressing. Change your dressing at least once a day. Change it more often if needed to keep the dressing dry. Make sure you: 1. Gather your supplies. 2. Wash your hands with soap and water before you change your dressing. If soap and water are not available, use hand sanitizer. 3. Remove the old dressing. Avoid using scissors to do that. 4. Wash your hands with soap and water again after removing the old dressing. 5. Use sterile saline to clean your skin around the drain. You may need to use a cotton swab to clean the skin. 6. Place the tube through the slit in a drain sponge. Place the drain sponge so that it covers your wound. 7. Place the gauze square or another drain sponge on top of the drain sponge that is on the wound. Make sure the tube is between those layers. 8. Tape the dressing to your skin. 9. Tape the drainage tube to your  skin 1-2 inches (2.5-5 cm) below the place where the tube enters your body. Taping keeps the tube from pulling on any stitches (sutures) that you have. 10. Wash your hands with soap and water. 11. Write down the color of your drainage and how often you change your dressing. How to empty your active drain  1. Make sure that you have a measuring cup that you can empty your drainage into. 2. Wash your hands with soap and water. If soap and water are not available, use hand sanitizer. 3. Loosen any pins or clips that hold the tube in place. 4. If your health care provider tells you to strip the tube to prevent clots and tube blockages: ? Hold the tube at the skin with one hand. Use your other hand  to pinch the tubing with your thumb and first finger. ? Gently move your fingers down the tube while squeezing very lightly. This clears any drainage, clots, or tissue from the tube. ? You may need to do this several times each day to keep the tube clear. Do not pull on the tube. 5. Open the bulb cap or the drain plug. Do not touch the inside of the cap or the bottom of the plug. 6. Turn the device upside down and gently squeeze. 7. Empty all of the drainage into the measuring cup. 8. Compress the bulb or the container and replace the cap or the plug. To compress the bulb or the container, squeeze it firmly in the middle while you close the cap or plug the container. 9. Write down the amount of drainage that you have in each 24-hour period. If you have less than 2 Tbsp (30 mL) of drainage during 24 hours, contact your health care provider. 10. Flush the drainage down the toilet. 11. Wash your hands with soap and water. Contact a health care provider if:  You have redness, swelling, or pain around your drain area.  You have pus or a bad smell coming from your drain area.  You have a fever or chills.  The skin around your drain is warm to the touch.  The amount of drainage that you have is increasing instead of decreasing.  You have drainage that is cloudy.  There is a sudden stop or a sudden decrease in the amount of drainage that you have.  Your drain tube falls out.  Your active drain does not stay compressed after you empty it. Summary  Surgical drains are used to remove extra fluid that normally builds up in a surgical wound after surgery.  Different kinds of surgical drains include active drains and passive drains. Active drains use suction to pull drainage away from the surgical wound, and passive drains allow fluid to drain naturally.  It is important to care for your drain to prevent infection. If your drain is placed at your back, or any other hard-to-reach area, ask  another person to assist you.  Contact your health care provider if you have redness, swelling, or pain around your drain area. This information is not intended to replace advice given to you by your health care provider. Make sure you discuss any questions you have with your health care provider. Document Revised: 12/04/2018 Document Reviewed: 12/04/2018 Elsevier Patient Education  2020 [employer].

## 2020-09-15 NOTE — Discharge Summary (Signed)
Physician Discharge Summary  Patient ID: Colleen Lowery MRN: [id number] DOB/AGE: October 18, 1941 79 y.o.  Admit date: 09/14/2020 Discharge date: 09/15/2020  Admission Diagnoses: Inflammatory left breast cancer  Discharge Diagnoses: Same Active Problems:   Breast cancer, left [facility])   Discharged Condition: good  Hospital Course: Patient admitted for a left modified radical mastectomy after completing chemotherapy for inflammatory left breast cancer. She did well postoperatively. 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 She had no dysrhythmias. She had no chest pain or shortness of breath. Her incision remained clean dry and intact and her JP drainage was serous. She was ready for discharge on postoperative day one in stable condition. The patient opted to wear her best home but I told her to discuss this further with her cardiologist at a later time. Postoperative instructions were given verbally and in written form.  Consults: None  Significant Diagnostic Studies: labs:  CBC    Component Value Date/Time   WBC 10.1 09/15/2020 0416   RBC 3.89 09/15/2020 0416   HGB 12.3 09/15/2020 0416   HGB 12.3 08/02/2020 0853   HGB 13.9 06/20/2012 1055   HCT 36.8 09/15/2020 0416   HCT 40.1 06/20/2012 1055   PLT 209 09/15/2020 0416   PLT 206 08/02/2020 0853   PLT 308 06/20/2012 1055   MCV 94.6 09/15/2020 0416   MCV 93 06/20/2012 1055   MCH 31.6 09/15/2020 0416   MCHC 33.4 09/15/2020 0416   RDW 12.9 09/15/2020 0416   RDW 14.3 06/20/2012 1055   LYMPHSABS 1.1 09/06/2020 1030   LYMPHSABS 2.1 06/20/2012 1055   MONOABS 0.7 09/06/2020 1030   MONOABS 0.6 06/20/2012 1055   EOSABS 0.1 09/06/2020 1030   EOSABS 0.0 06/20/2012 1055   BASOSABS 0.0 09/06/2020 1030   BASOSABS 0.1 06/20/2012 1055   CMP Latest Ref Rng & Units 09/15/2020 09/15/2020 09/06/2020  Glucose 70 - 99 mg/dL 126(H) 836(HH) 147(H)  BUN 8 - 23 mg/dL 13 12 12   Creatinine  0.44 - 1.00 mg/dL 0.84 0.78 0.77  Sodium 135 - 145 mmol/L 138 140 139  Potassium 3.5 - 5.1 mmol/L 4.7 3.7 4.1  Chloride 98 - 111 mmol/L 106 114(H) 112(H)  CO2 22 - 32 mmol/L 23 17(L) 18(L)  Calcium 8.9 - 10.3 mg/dL 8.8(L) 7.2(L) 9.5  Total Protein 6.5 - 8.1 g/dL 5.8(L) 4.7(L) 6.8  Total Bilirubin 0.3 - 1.2 mg/dL 0.6 0.5 0.8  Alkaline Phos 38 - 126 U/L 85 70 93  AST 15 - 41 U/L 18 15 17   ALT 0 - 44 U/L 10 10 11      Treatments: surgery: Left modified radical mastectomy  Discharge Exam: Blood pressure (!) 123/58, pulse 65, temperature 98.3 F (36.8 C), temperature source Oral, resp. rate 13, height 5' 2"  (1.575 m), weight 62.4 kg, SpO2 100 %. General appearance: alert and cooperative Resp: clear to auscultation bilaterally Cardio: regular rate and rhythm, S1, S2 normal, no murmur, click, rub or gallop Incision/Wound: Left mastectomy incision clean dry intact. No hematoma. Flaps are viable. JP is serous and appropriate.  Disposition: Discharge disposition: 01-Home or Self Care       Discharge Instructions    Diet - low sodium heart healthy   Complete by: As directed    Increase activity slowly   Complete by: As directed      Allergies as of 09/15/2020      Reactions   Contrast Media [iodinated Diagnostic Agents] Anaphylaxis  Throat closed, was 40 yrs ago   Purell Instant Hand [alcohol] Swelling   Swelling in the luNGS, CHEST AND THROAT and seizures   Atorvastatin    Myalgia    Dust Mite Extract Swelling   SWELLING IS GI SYSTEM   Latex    QUESTIONABLE INTOLERABLE MUCOUS OOZING OUT OF THROAT   Molds & Smuts Swelling   SWELLING IS IN GI SYSTEM      Medication List    TAKE these medications   b complex vitamins tablet Take 1 tablet by mouth 3 (three) times a week.   CALCIUM PO Take 1 tablet by mouth daily.   Duodenum Tabs Take 1-5 tablets by mouth daily as needed (stomach swelling).   furosemide 20 MG tablet Commonly known as: LASIX Take 20 mg by mouth  daily.   lidocaine-prilocaine cream Commonly known as: EMLA Apply 1 application topically daily as needed (access port).   LIVER PO Take 1 tablet by mouth 3 (three) times a week.   losartan 50 MG tablet Commonly known as: COZAAR Take 50 mg by mouth daily.   MAGNESIUM PO Take 1 tablet by mouth daily.   methocarbamol 500 MG tablet Commonly known as: ROBAXIN Take 1 tablet (500 mg total) by mouth every 6 (six) hours as needed for muscle spasms.   nitroGLYCERIN 0.4 MG SL tablet Commonly known as: NITROSTAT Place 1 tablet (0.4 mg total) under the tongue every 5 (five) minutes x 3 doses as needed for chest pain.   ondansetron 8 MG tablet Commonly known as: Zofran Take 1 tablet (8 mg total) by mouth 2 (two) times daily as needed for refractory nausea / vomiting. Start on day 3 after chemotherapy.   OVER THE COUNTER MEDICATION Take 1-5 tablets by mouth daily as needed (itching). Vitamin b5   OVER THE COUNTER MEDICATION Take 5-10 tablets by mouth as needed (mold induced itching). SOD superoxide dismutase   OVER THE COUNTER MEDICATION Take 10 tablets by mouth daily as needed (constipation). Mega-zyme digestive enzyme   PANTETHINE PO Take 1-2 tablets by mouth daily.   potassium chloride SA 20 MEQ tablet Commonly known as: KLOR-CON Take 20 mEq by mouth 2 (two) times daily.   prochlorperazine 10 MG tablet Commonly known as: COMPAZINE Take 1 tablet (10 mg total) by mouth every 6 (six) hours as needed (Nausea or vomiting).   sotalol 80 MG tablet Commonly known as: BETAPACE Take 1 tablet (80 mg total) by mouth 2 (two) times daily.   traMADol 50 MG tablet Commonly known as: ULTRAM Take 1 tablet (50 mg total) by mouth every 6 (six) hours as needed (mild pain).   vitamin A 25000 UNIT capsule Take 25,000 Units by mouth daily.   VITAMIN C PO Take 1,000 mg by mouth 3 (three) times a week.   Vitamin D3 125 MCG (5000 UT) Caps Take 5,000 Units by mouth daily.   vitamin E 180 MG  (400 UNITS) capsule Take 400 Units by mouth 3 (three) times a week.        Signed: Joyice Faster Dontrae Morini 09/15/2020, 8:18 AM

## 2020-09-15 NOTE — Plan of Care (Signed)

## 2020-09-15 NOTE — Progress Notes (Signed)
0250: lab called critical glucose of 836. Went to access pt,  she was resting in bed, ao x4. Denied any discomfort. Finger stick blood sugar was 131. Called lab back and ordered a recollection. Recollected blood sugar was 126. Will continue to monitor.

## 2020-09-20 ENCOUNTER — Encounter: Payer: Self-pay | Admitting: *Deleted

## 2020-09-21 NOTE — Progress Notes (Signed)
Patient Care Team: [name], MD as PCP - General (Family Medicine) [name], RN as Oncology Nurse Navigator [name], RN as Oncology Nurse Navigator  DIAGNOSIS:    ICD-10-CM   1. Malignant neoplasm of upper-outer quadrant of left breast in female, estrogen receptor positive (Colleen Lowery)  C50.412    Z17.0     SUMMARY OF ONCOLOGIC HISTORY: Oncology History  Malignant neoplasm of upper-outer quadrant of left breast in female, estrogen receptor positive ([place])  03/22/2020 Initial Diagnosis   Diffuse left breast swelling. skin thickening and two areas of irregular hypoechogenicity in the 2-3 o'clock region, 4.9cm, and at least 4 abnormal lymph nodes  with cortical thickening. 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, grade 2, HER-2 negative (1+), ER+ 30%, PR+ 10%, Ki67 15%   04/07/2020 Cancer Staging   Staging form: Breast, AJCC 8th Edition - Clinical stage from 04/07/2020: Stage IIIB (cT4b, cN1, cM0, G2, ER+, PR+, HER2-) - Signed by Nicholas Lose, MD on 04/07/2020   04/19/2020 -  Chemotherapy   The patient had palonosetron (ALOXI) injection 0.25 mg, 0.25 mg, Intravenous,  Once, 6 of 6 cycles Administration: 0.25 mg (04/19/2020), 0.25 mg (05/10/2020), 0.25 mg (05/31/2020), 0.25 mg (06/21/2020), 0.25 mg (07/12/2020), 0.25 mg (08/02/2020) methotrexate (PF) chemo injection 50 mg, 29.8 mg/m2 = 50.5 mg (100 % of original dose 30 mg/m2), Intravenous,  Once, 6 of 6 cycles Dose modification: 30 mg/m2 (original dose 30 mg/m2, Cycle 1, Reason: Provider Judgment), 25 mg/m2 (original dose 30 mg/m2, Cycle 3, Reason: Provider Judgment) Administration: 50 mg (04/19/2020), 50 mg (05/10/2020), 42 mg (05/31/2020), 42 mg (06/21/2020), 42 mg (07/12/2020), 42 mg (08/02/2020) cyclophosphamide (CYTOXAN) 840 mg in sodium chloride 0.9 % 250 mL chemo infusion, 500 mg/m2 = 840 mg (100 % of original dose 500 mg/m2), Intravenous,  Once, 6 of 6 cycles Dose modification: 500 mg/m2 (original dose  500 mg/m2, Cycle 1, Reason: Provider Judgment), 400 mg/m2 (original dose 500 mg/m2, Cycle 3, Reason: Provider Judgment) Administration: 840 mg (04/19/2020), 840 mg (05/10/2020), 680 mg (05/31/2020), 680 mg (06/21/2020), 680 mg (07/12/2020), 680 mg (08/02/2020) fluorouracil (ADRUCIL) chemo injection 850 mg, 500 mg/m2 = 850 mg (100 % of original dose 500 mg/m2), Intravenous,  Once, 6 of 6 cycles Dose modification: 500 mg/m2 (original dose 500 mg/m2, Cycle 1, Reason: Provider Judgment) Administration: 850 mg (04/19/2020), 850 mg (05/10/2020), 850 mg (05/31/2020), 850 mg (06/21/2020), 850 mg (07/12/2020), 850 mg (08/02/2020)  for chemotherapy treatment.    09/14/2020 Surgery   Left mastectomy ([name]): invasive and in situ ductal carcinoma, grade 2, 15cm, clear margins, with 14/14 lymph nodes positive for metastatic carcinoma with extracapsular extension.      CHIEF COMPLIANT: Follow-up s/p left mastectomy   INTERVAL HISTORY: Colleen Lowery is a 79 y.o. with above-mentioned history of left breast cancer who completed neoadjuvant chemotherapy with CMF. She underwent a left mastectomy with Dr. Brantley Stage on 09/14/20 for which pathology showed invasive and in situ ductal carcinoma, grade 2, 15cm, clear margins, with 14/14 lymph nodes positive for metastatic carcinoma with extracapsular extension. She presents to the clinic todayto review the pathology report and discuss further treatment.    ALLERGIES:  is allergic to contrast media [iodinated diagnostic agents], purell instant hand [alcohol], atorvastatin, dust mite extract, latex, and molds & smuts.  MEDICATIONS:  Current Outpatient Medications  Medication Sig Dispense Refill  . Ascorbic Acid (VITAMIN C PO) Take 1,000 mg by mouth 3 (three) times a week.     [name]  b complex vitamins tablet Take 1 tablet by mouth 3 (three) times a week.     . Beta Carotene (VITAMIN A) 25000 UNIT capsule Take 25,000 Units by mouth daily.     [name] CALCIUM PO Take 1 tablet by mouth daily.     .  Cholecalciferol (VITAMIN D3) 5000 units CAPS Take 5,000 Units by mouth daily.     . furosemide (LASIX) 20 MG tablet Take 20 mg by mouth daily.    [name] lidocaine-prilocaine (EMLA) cream Apply 1 application topically daily as needed (access port). 30 g 0  . Liver Extract (LIVER PO) Take 1 tablet by mouth 3 (three) times a week.     . losartan (COZAAR) 50 MG tablet Take 50 mg by mouth daily.    [name] MAGNESIUM PO Take 1 tablet by mouth daily.     . methocarbamol (ROBAXIN) 500 MG tablet Take 1 tablet (500 mg total) by mouth every 6 (six) hours as needed for muscle spasms. 20 tablet 0  . Misc Natural Products (DUODENUM) TABS Take 1-5 tablets by mouth daily as needed (stomach swelling).    . nitroGLYCERIN (NITROSTAT) 0.4 MG SL tablet Place 1 tablet (0.4 mg total) under the tongue every 5 (five) minutes x 3 doses as needed for chest pain. 25 tablet 12  . ondansetron (ZOFRAN) 8 MG tablet Take 1 tablet (8 mg total) by mouth 2 (two) times daily as needed for refractory nausea / vomiting. Start on day 3 after chemotherapy. 30 tablet 1  . OVER THE COUNTER MEDICATION Take 1-5 tablets by mouth daily as needed (itching). Vitamin b5     . OVER THE COUNTER MEDICATION Take 5-10 tablets by mouth as needed (mold induced itching). SOD superoxide dismutase     . OVER THE COUNTER MEDICATION Take 10 tablets by mouth daily as needed (constipation). Mega-zyme digestive enzyme    . PANTETHINE PO Take 1-2 tablets by mouth daily.    . potassium chloride SA (K-DUR,KLOR-CON) 20 MEQ tablet Take 20 mEq by mouth 2 (two) times daily.     . prochlorperazine (COMPAZINE) 10 MG tablet Take 1 tablet (10 mg total) by mouth every 6 (six) hours as needed (Nausea or vomiting). 30 tablet 1  . sotalol (BETAPACE) 80 MG tablet Take 1 tablet (80 mg total) by mouth 2 (two) times daily. 60 tablet 6  . traMADol (ULTRAM) 50 MG tablet Take 1 tablet (50 mg total) by mouth every 6 (six) hours as needed (mild pain). 30 tablet 0  . vitamin E 400 UNIT capsule  Take 400 Units by mouth 3 (three) times a week.      No current facility-administered medications for this visit.    PHYSICAL EXAMINATION: ECOG PERFORMANCE STATUS: 1 - Symptomatic but completely ambulatory  There were no vitals filed for this visit. There were no vitals filed for this visit.    LABORATORY DATA:  I have reviewed the data as listed CMP Latest Ref Rng & Units 09/15/2020 09/15/2020 09/06/2020  Glucose 70 - 99 mg/dL 126(H) 836(HH) 147(H)  BUN 8 - 23 mg/dL _0 Creatinine 0.44 - 1.00 mg/dL 0.84 0.78 0.77  Sodium 135 - 145 mmol/L 138 140 139  Potassium 3.5 - 5.1 mmol/L 4.7 3.7 4.1  Chloride 98 - 111 mmol/L 106 114(H) 112(H)  CO2 22 - 32 mmol/L 23 17(L) 18(L)  Calcium 8.9 - 10.3 mg/dL 8.8(L) 7.2(L) 9.5  Total Protein 6.5 - 8.1 g/dL 5.8(L) 4.7(L) 6.8  Total Bilirubin 0.3 - 1.2 mg/dL  0.6 0.5 0.8  Alkaline Phos 38 - 126 U/L 85 70 93  AST 15 - 41 U/L _0 ALT 0 - 44 U/L _1 Lab Results  Component Value Date   WBC 10.1 09/15/2020   HGB 12.3 09/15/2020   HCT 36.8 09/15/2020   MCV 94.6 09/15/2020   PLT 209 09/15/2020   NEUTROABS 2.8 09/06/2020    ASSESSMENT & PLAN:  Malignant neoplasm of upper-outer quadrant of left breast in female, estrogen receptor positive ([place]) 03/22/2020:diffuse left breast swelling. skin thickening and two areas of irregular hypoechogenicity in the 2-3 o'clock region, 4.9cm, and at least 4 abnormal lymph nodes with cortical thickening. 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 Subacute and chronic rib fractures right  chest. No evidence of metastatic disease.  Treatment plan: 1.Neoadjuvant chemotherapywith CMF 2.Mastectomy with axillary lymph node dissection 3.Adjuvant radiation 4.Followed by adjuvant antiestrogen therapy ------------------------------------------------------------------------------------------------------------------------------------------- 09/14/2020:Left mastectomy ([name]): invasive and in situ ductal carcinoma, grade 2, 15cm, clear margins, with 14/14 lymph nodes positive for metastatic carcinoma with extracapsular extension.  ER 30%, PR 10%, HER-2 negative, Ki-67 15%  Pathology counseling: I discussed the final pathology report of the patient provided  a copy of this report. I discussed the margins as well as lymph node surgeries. We also discussed the final staging along with previously performed ER/PR and HER-2/neu testing.  Treatment plan:  1. Adjuvant radiation  3. Antiestrogen therapy with anastrozole along with abemaciclib    No orders of the defined types were placed in this encounter.  The patient has a good understanding of the overall plan. she agrees with it. she will call with any problems that may develop before the next visit here.  Total time spent: 30 mins including face to face time and time spent for planning, charting and coordination of care  Nicholas Lose, MD 09/22/2020  I, Cloyde Reams Dorshimer, am acting as scribe for Dr. Nicholas Lose.  I have reviewed the above documentation for accuracy and completeness, and I agree with the above.

## 2020-09-22 ENCOUNTER — Other Ambulatory Visit: Payer: Self-pay

## 2020-09-22 ENCOUNTER — Inpatient Hospital Stay: Payer: Medicare HMO | Attending: Hematology and Oncology | Admitting: Hematology and Oncology

## 2020-09-22 DIAGNOSIS — Z79899 Other long term (current) drug therapy: Secondary | ICD-10-CM | POA: Diagnosis not present

## 2020-09-22 DIAGNOSIS — Z79811 Long term (current) use of aromatase inhibitors: Secondary | ICD-10-CM | POA: Insufficient documentation

## 2020-09-22 DIAGNOSIS — Z9012 Acquired absence of left breast and nipple: Secondary | ICD-10-CM | POA: Diagnosis not present

## 2020-09-22 DIAGNOSIS — C50412 Malignant neoplasm of upper-outer quadrant of left female breast: Secondary | ICD-10-CM | POA: Insufficient documentation

## 2020-09-22 DIAGNOSIS — Z170 Estrogen receptor positive status [ER+]: Secondary | ICD-10-CM

## 2020-09-22 LAB — SURGICAL PATHOLOGY

## 2020-09-22 NOTE — Assessment & Plan Note (Signed)
03/22/2020:diffuse left breast swelling. skin thickening and two areas of irregular hypoechogenicity in the 2-3 o'clock region, 4.9cm, and at least 4 abnormal lymph nodes with cortical thickening. 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 Subacute and chronic rib fractures right chest. No evidence of metastatic disease.  Treatment plan: 1.Neoadjuvant chemotherapywith CMF 2.Mastectomy with axillary lymph node dissection 3.Adjuvant radiation 4.Followed by adjuvant antiestrogen therapy ------------------------------------------------------------------------------------------------------------------------------------------- 09/14/2020:Left mastectomy ([name]): invasive and in situ ductal carcinoma, grade 2, 15cm, clear margins, with 14/14 lymph nodes positive for metastatic carcinoma with extracapsular extension.  ER 30%, PR 10%, HER-2 negative, Ki-67 15%  Pathology counseling: I discussed the final pathology report of the patient provided  a copy of this report. I discussed the margins as well as lymph node surgeries. We also discussed the final staging along with previously performed ER/PR and HER-2/neu testing.  Treatment plan:  1.  Adjuvant radiation with Xeloda 2. Antiestrogen therapy with anastrozole along with abemaciclib

## 2020-09-27 ENCOUNTER — Telehealth: Payer: Self-pay | Admitting: Hematology and Oncology

## 2020-09-27 NOTE — Telephone Encounter (Signed)
No 11/10 los, no changes made to pt schedule

## 2020-10-05 ENCOUNTER — Other Ambulatory Visit: Payer: Self-pay

## 2020-10-05 ENCOUNTER — Ambulatory Visit: Payer: Medicare HMO | Attending: Surgery | Admitting: Rehabilitation

## 2020-10-05 ENCOUNTER — Encounter: Payer: Self-pay | Admitting: Rehabilitation

## 2020-10-05 DIAGNOSIS — I890 Lymphedema, not elsewhere classified: Secondary | ICD-10-CM

## 2020-10-05 NOTE — Therapy (Signed)
[place], [place], [address] Phone: [phone]   Fax:  [phone]  Physical Therapy Treatment  Patient Details  Name: Colleen Lowery MRN: [id number] Date of Birth: [birth year] Referring Provider (PT): Dr. Brantley Stage   Encounter Date: 10/05/2020   PT End of Session - 10/05/20 1037    Visit Number 2    Number of Visits 2    Date for PT Re-Evaluation 10/12/20    PT Start Time 1003    PT Stop Time 1037    PT Time Calculation (min) 34 min    Activity Tolerance Patient tolerated treatment well    Behavior During Therapy [facility] for tasks assessed/performed           Past Medical History:  Diagnosis Date  . Allergy   . Arthritis    FINGERS  . Cancer [facility])    left breast  . Cataract    right  . Crohn disease ([place])   . Depression   . Diabetes mellitus without complication ([place])    TYPE 2 , TAKING OTC MED FORGYMEMA  . GERD (gastroesophageal reflux disease)   . Headache    sinus  . Heart failure, acute, systolic and diastolic ([place]) MARCH 3664 DX  . History of colonic polyps   . Hypertension   . Hypothyroidism    per patient, takes OTC meds when temperature drops low  . IBS (irritable bowel syndrome)   . 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 Thyroid disease    AGE 79'S UNTIL  2016 OFF ALL THRYOID MEDS NOW   . Wears dentures   . Wears glasses     Past Surgical History:  Procedure Laterality Date  . ABDOMINAL HYSTERECTOMY     PARTIAL  . BREAST SURGERY    . CARDIAC CATHETERIZATION  02/10/2016   Procedure: Right/Left Heart Cath and Coronary Angiography;  Surgeon: Leonie Man, MD;  Location: [place] CV LAB;  Service: Cardiovascular;;  . CATARACT EXTRACTION W/ INTRAOCULAR LENS IMPLANT     left  . COLONOSCOPY WITH PROPOFOL N/A 02/16/2017   Procedure: COLONOSCOPY WITH PROPOFOL;  Surgeon: Carol Ada, MD;  Location: [facility];  Service: Endoscopy;  Laterality:  N/A;  . EP IMPLANTABLE DEVICE N/A 02/10/2016   Procedure: ICD Implant;  Surgeon: Will Meredith Leeds, MD;  Location: [place] CV LAB;  Service: Cardiovascular;  Laterality: N/A;  . ESOPHAGOGASTRODUODENOSCOPY (EGD) WITH PROPOFOL N/A 02/16/2017   Procedure: ESOPHAGOGASTRODUODENOSCOPY (EGD) WITH PROPOFOL;  Surgeon: Carol Ada, MD;  Location: [facility];  Service: Endoscopy;  Laterality: N/A;  reflux  . IR IMAGING GUIDED PORT INSERTION  04/27/2020  . MASTECTOMY MODIFIED RADICAL Left 09/14/2020   Procedure: LEFT MASTECTOMY MODIFIED RADICAL;  Surgeon: Erroll Luna, MD;  Location: [place];  Service: General;  Laterality: Left;  PEC BLOCK  . TONSILLECTOMY      There were no vitals filed for this visit.   Subjective Assessment - 10/05/20 1004    Subjective It was rough.    Pertinent History Left IDC ER postitive stage IIIB, T4BN1 with mastectomy and ALND on 09/14/20 with Dr. Brantley Stage 14/14 nodes positive.  Neoadjuvant chemotherapy completed with CMF with no adverse effects.  Will be having radiation after surgery.  Other history of DM, heart failure, electrodes monitoring ventricular tachycardia.    Limitations Lifting    Currently in Pain? Yes    Pain Score 1     Pain Location Axilla  Pain Orientation Left    Pain Descriptors / Indicators Aching;Throbbing    Pain Type Surgical pain    Pain Onset More than a month ago    Pain Frequency Constant              [facility] PT Assessment - 10/05/20 0001      Observation/Other Assessments   Observations pt wearing post op binder; incision healing well with white taped gauze over drain removal site but gauze clear and pt educated to remove and only reapply if needed.  Pt still wearing halter monitor/ until pacemaker can be reinserted    Skin Integrity dog ear/extra skin along chest incision; no apparent edema/seroma      Posture/Postural Control   Posture/Postural Control Postural limitations    Postural Limitations Rounded Shoulders;Forward head       AROM   Left Shoulder Extension 70 Degrees    Left Shoulder Flexion 136 Degrees    Left Shoulder ABduction 145 Degrees    Left Shoulder External Rotation --   80%            LYMPHEDEMA/ONCOLOGY QUESTIONNAIRE - 10/05/20 0001      Left Upper Extremity Lymphedema   10 cm Proximal to Olecranon Process 27.2 cm    Olecranon Process 25.5 cm    10 cm Proximal to Ulnar Styloid Process 20.2 cm    Just Proximal to Ulnar Styloid Process 15.6 cm    Across Hand at [employer] 20.1 cm    At [place] of 2nd Digit 5.8 cm              Quick Dash - 10/05/20 0001    Open a tight or new jar Severe difficulty    Do heavy household chores (wash walls, wash floors) Moderate difficulty    Carry a shopping bag or briefcase No difficulty    Wash your back Mild difficulty    Use a knife to cut food Mild difficulty    Recreational activities in which you take some force or impact through your arm, shoulder, or hand (golf, hammering, tennis) Moderate difficulty    During the past week, to what extent has your arm, shoulder or hand problem interfered with your normal social activities with family, friends, neighbors, or groups? Slightly    During the past week, to what extent has your arm, shoulder or hand problem limited your work or other regular daily activities Not at all    Arm, shoulder, or hand pain. Mild    Tingling (pins and needles) in your arm, shoulder, or hand None    Difficulty Sleeping No difficulty    DASH Score 25 %                          PT Education - 10/05/20 1036    Education Details ABC class packet information, how to monitor for lymphedema and risk reduction, when to return to PT    Person(s) Educated Patient    Methods Explanation;Demonstration;Handout    Comprehension Verbalized understanding;Returned demonstration               PT Long Term Goals - 10/05/20 1040      PT LONG TERM GOAL #1   Title Pt will return to baseline AROM in the  surgical extremity to prepare for radiation and return to ADLs    Status Achieved      PT LONG TERM GOAL #2   Title Pt will be  educated on risk reduction and lymphedema surveillance    Status Achieved                 Plan - 10/05/20 1037    Clinical Impression Statement Pt returns 3 weeks post left mastectomy with ALND with ROM equal or better than prior to surgery, skin healing well, and no signs of lympedema currently.  Session focused on education regarding walking, use of arm, activity, getting ready for radiation and focusing on post op movements especially supine stargazer, and when to return to PT with any signs of arm heaviness or discomfort, swelling or loss of ROM.  Pt will not do any more visits at this time but is aware she can return at any time.    Personal Factors and Comorbidities Age;Comorbidity 2    PT Treatment/Interventions ADLs/Self Care Home Management;Therapeutic exercise;Manual lymph drainage;Manual techniques;Patient/family education;Passive range of motion;Taping    PT Next Visit Plan **No sanitizer use or in the room** recheck as needed, most likely no return    Consulted and Agree with Plan of Care Patient           Patient will benefit from skilled therapeutic intervention in order to improve the following deficits and impairments:     Visit Diagnosis: Lymphedema, not elsewhere classified  Lymphedema     Problem List Patient Active Problem List   Diagnosis Date Noted  . Breast cancer, left ([place]) 09/14/2020  . Port-A-Cath in place 05/31/2020  . Malignant neoplasm of upper-outer quadrant of left breast in female, estrogen receptor positive ([place]) 04/07/2020  . Peripheral arterial disease ([place]) 06/14/2016  . Upper airway cough syndrome 06/08/2016  . CAD in native artery 05/31/2016  . Chronic combined systolic and diastolic congestive heart failure ([place]) 05/31/2016  . VT (ventricular tachycardia) ([place]) 02/09/2016  . Acute on chronic combined  systolic and diastolic HF (heart failure) ([place]) 02/09/2016  . Contrast media allergy 02/09/2016  . Dyspnea 02/07/2016  . Benign essential HTN 02/07/2016  . Cigarette smoker 02/07/2016  . Diabetes ([place]) 08/02/2014  . Essential hypertension 08/02/2014    10/05/2020, 10:42 AM Shan Levans, PT    [place], [place], [address] Phone: [phone]   Fax:  [phone]  Name: AVANTI JETTER MRN: [id number] Date of Birth: December 30, 1940

## 2020-10-14 ENCOUNTER — Other Ambulatory Visit: Payer: Self-pay

## 2020-10-14 ENCOUNTER — Ambulatory Visit
Admission: RE | Admit: 2020-10-14 | Discharge: 2020-10-14 | Disposition: A | Discharge status: Home / Self Care | Payer: Medicare HMO | Source: Ambulatory Visit | Attending: Radiation Oncology | Admitting: Radiation Oncology

## 2020-10-14 ENCOUNTER — Encounter: Payer: Self-pay | Admitting: Radiation Oncology

## 2020-10-14 DIAGNOSIS — Z170 Estrogen receptor positive status [ER+]: Secondary | ICD-10-CM

## 2020-10-14 DIAGNOSIS — C50412 Malignant neoplasm of upper-outer quadrant of left female breast: Secondary | ICD-10-CM

## 2020-10-14 NOTE — Progress Notes (Addendum)
Radiation Oncology         [phone] ________________________________  Outpatient Follow Up - Conducted via telephone due to current COVID-19 concerns for limiting patient exposure  I spoke with the patient to conduct this consult visit via telephone to spare the patient unnecessary potential exposure in the healthcare setting during the current COVID-19 pandemic. The patient was notified in advance and was offered a [place] meeting to allow for face to face communication but unfortunately reported that they did not have the appropriate resources/technology to support such a visit and instead preferred to proceed with a telephone visit.    Name: NEIDA ELLEGOOD        MRN: [id number]  Date of Service: 10/14/2020 DOB: [birth year]  ON:GEXBM, [name], MD  Nicholas Lose, MD     REFERRING PHYSICIAN: Nicholas Lose, MD   DIAGNOSIS: The encounter diagnosis was Malignant neoplasm of upper-outer quadrant of left breast in female, estrogen receptor positive ([place]).   HISTORY OF PRESENT ILLNESS: SADI ARAVE is a 79 y.o. female who was diagnosed with a locally advanced left breast cancer in the summer of 2021. The patient had noted swelling of the left breast and was found to have a 4.9 cm mass in the left breast at 2:00, and at least 4 abnormal left axillary nodes. She proceeded with neoadjuvant chemotherapy between 04/19/2020 and 08/02/2020. She subsequently underwent surgical resection with left modified radical mastectomy on 09/14/2020, final pathology revealed a grade 2 invasive ductal carcinoma measuring 15 cm in greatest dimension with associated intermediate grade DCIS, her margins were negative for carcinoma, and a total of 14 lymph nodes were removed all of which contained metastatic disease and extracapsular extension was identified. Her tumor was ER/PR positive HER-2 negative, and she has been counseled on the rationale for adjuvant radiotherapy and antiestrogen. She is contacted today by phone to  discuss the rationale for adjuvant treatment. She is scheduled to see Dr. Brantley Stage for follow-up next Tuesday.    PREVIOUS RADIATION THERAPY: No   PAST MEDICAL HISTORY:  Past Medical History:  Diagnosis Date  . Allergy   . Arthritis    FINGERS  . Cancer [facility])    left breast  . Cataract    right  . Crohn disease ([place])   . Depression   . Diabetes mellitus without complication ([place])    TYPE 2 , TAKING OTC MED FORGYMEMA  . GERD (gastroesophageal reflux disease)   . Headache    sinus  . Heart failure, acute, systolic and diastolic ([place]) MARCH 8413 DX  . History of colonic polyps   . Hypertension   . Hypothyroidism    per patient, takes OTC meds when temperature drops low  . IBS (irritable bowel syndrome)   . 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 Thyroid disease    AGE 30'S UNTIL  2016 OFF ALL THRYOID MEDS NOW   . Wears dentures   . Wears glasses        PAST SURGICAL HISTORY: Past Surgical History:  Procedure Laterality Date  . ABDOMINAL HYSTERECTOMY     PARTIAL  . BREAST SURGERY    . CARDIAC CATHETERIZATION  02/10/2016   Procedure: Right/Left Heart Cath and Coronary Angiography;  Surgeon: Leonie Man, MD;  Location: [facility];  Service: Cardiovascular;;  . CATARACT EXTRACTION W/ INTRAOCULAR LENS IMPLANT     left  . COLONOSCOPY WITH PROPOFOL N/A 02/16/2017   Procedure: COLONOSCOPY WITH PROPOFOL;  Surgeon: Carol Ada, MD;  Location: [facility];  Service: Endoscopy;  Laterality: N/A;  . EP IMPLANTABLE DEVICE N/A 02/10/2016   Procedure: ICD Implant;  Surgeon: Will Meredith Leeds, MD;  Location: [facility];  Service: Cardiovascular;  Laterality: N/A;  . ESOPHAGOGASTRODUODENOSCOPY (EGD) WITH PROPOFOL N/A 02/16/2017   Procedure: ESOPHAGOGASTRODUODENOSCOPY (EGD) WITH PROPOFOL;  Surgeon: Carol Ada, MD;  Location: [facility];  Service: Endoscopy;  Laterality: N/A;  reflux  . IR IMAGING GUIDED PORT INSERTION  04/27/2020  .  MASTECTOMY MODIFIED RADICAL Left 09/14/2020   Procedure: LEFT MASTECTOMY MODIFIED RADICAL;  Surgeon: Erroll Luna, MD;  Location: [facility];  Service: General;  Laterality: Left;  PEC BLOCK  . TONSILLECTOMY       FAMILY HISTORY:  Family History  Problem Relation Age of Onset  . Diabetes Mother   . Stroke Mother   . Diabetes Brother   . Diabetes Maternal Grandmother   . Diabetes Paternal Grandfather   . Heart failure Paternal Grandfather   . Hypertension Brother   . Heart attack Neg Hx      SOCIAL HISTORY:  reports that she has been smoking cigarettes. She has a 12.50 pack-year smoking history. She has never used smokeless tobacco. She reports that she does not drink alcohol and does not use drugs. The patient is single and lives in [place].   ALLERGIES: Contrast media [iodinated diagnostic agents], Purell instant hand [alcohol], Atorvastatin, Dust mite extract, Latex, and Molds & smuts   MEDICATIONS:  Current Outpatient Medications  Medication Sig Dispense Refill  . Ascorbic Acid (VITAMIN C PO) Take 1,000 mg by mouth 3 (three) times a week.     [name] b complex vitamins tablet Take 1 tablet by mouth 3 (three) times a week.     . Beta Carotene (VITAMIN A) 25000 UNIT capsule Take 25,000 Units by mouth daily.     [name] CALCIUM PO Take 1 tablet by mouth daily.     . Cholecalciferol (VITAMIN D3) 5000 units CAPS Take 5,000 Units by mouth daily.     . furosemide (LASIX) 20 MG tablet Take 20 mg by mouth daily.    [name] lidocaine-prilocaine (EMLA) cream Apply 1 application topically daily as needed (access port). 30 g 0  . Liver Extract (LIVER PO) Take 1 tablet by mouth 3 (three) times a week.     . losartan (COZAAR) 50 MG tablet Take 50 mg by mouth daily.    [name] MAGNESIUM PO Take 1 tablet by mouth daily.     . methocarbamol (ROBAXIN) 500 MG tablet Take 1 tablet (500 mg total) by mouth every 6 (six) hours as needed for muscle spasms. 20 tablet 0  . Misc Natural Products (DUODENUM) TABS Take 1-5  tablets by mouth daily as needed (stomach swelling).    . nitroGLYCERIN (NITROSTAT) 0.4 MG SL tablet Place 1 tablet (0.4 mg total) under the tongue every 5 (five) minutes x 3 doses as needed for chest pain. 25 tablet 12  . ondansetron (ZOFRAN) 8 MG tablet Take 1 tablet (8 mg total) by mouth 2 (two) times daily as needed for refractory nausea / vomiting. Start on day 3 after chemotherapy. 30 tablet 1  . OVER THE COUNTER MEDICATION Take 1-5 tablets by mouth daily as needed (itching). Vitamin b5     . OVER THE COUNTER MEDICATION Take 5-10 tablets by mouth as needed (mold induced itching). SOD superoxide dismutase     . OVER THE COUNTER MEDICATION Take 10 tablets by mouth daily as  needed (constipation). Mega-zyme digestive enzyme    . PANTETHINE PO Take 1-2 tablets by mouth daily.    . potassium chloride SA (K-DUR,KLOR-CON) 20 MEQ tablet Take 20 mEq by mouth 2 (two) times daily.     . prochlorperazine (COMPAZINE) 10 MG tablet Take 1 tablet (10 mg total) by mouth every 6 (six) hours as needed (Nausea or vomiting). 30 tablet 1  . sotalol (BETAPACE) 80 MG tablet Take 1 tablet (80 mg total) by mouth 2 (two) times daily. 60 tablet 6  . traMADol (ULTRAM) 50 MG tablet Take 1 tablet (50 mg total) by mouth every 6 (six) hours as needed (mild pain). 30 tablet 0  . vitamin E 400 UNIT capsule Take 400 Units by mouth 3 (three) times a week.      No current facility-administered medications for this encounter.     REVIEW OF SYSTEMS: On review of systems, the patient reports that she is doing well considering all that she has been through. She states that she was disappointed that she did not have improvement in her tumor. She feels however that she is healing well since her surgery, she does report that she is extremely sensitive to different perfumes and fragrances and is actually quite allergic to the fumes from purell hand sanitizer and request that this be communicated to our staff. She reports that she is tired  from prior treatment, otherwise she feels that she is doing pretty well.     PHYSICAL EXAM:  Unable to assess given encounter type.   ECOG = 1  0 - Asymptomatic (Fully active, able to carry on all predisease activities without restriction)  1 - Symptomatic but completely ambulatory (Restricted in physically strenuous activity but ambulatory and able to carry out work of a light or sedentary nature. For example, light housework, office work)  2 - Symptomatic, <50% in bed during the day (Ambulatory and capable of all self care but unable to carry out any work activities. Up and about more than 50% of waking hours)  3 - Symptomatic, >50% in bed, but not bedbound (Capable of only limited self-care, confined to bed or chair 50% or more of waking hours)  4 - Bedbound (Completely disabled. Cannot carry on any self-care. Totally confined to bed or chair)  5 - Death   Eustace Pen MM, Creech RH, Tormey DC, et al. [phone]). "Toxicity and response criteria of the [facility]". [place] Oncol. 5 (6): 649-55    LABORATORY DATA:  Lab Results  Component Value Date   WBC 10.1 09/15/2020   HGB 12.3 09/15/2020   HCT 36.8 09/15/2020   MCV 94.6 09/15/2020   PLT 209 09/15/2020   Lab Results  Component Value Date   NA 138 09/15/2020   K 4.7 09/15/2020   CL 106 09/15/2020   CO2 23 09/15/2020   Lab Results  Component Value Date   ALT 10 09/15/2020   AST 18 09/15/2020   ALKPHOS 85 09/15/2020   BILITOT 0.6 09/15/2020      RADIOGRAPHY: No results found.     IMPRESSION/PLAN: 1. Stage IIIB, cT4N1M0, grade 2, ER/PR positive invasive ductal carcinoma of the left breast. Dr. Lisbeth Renshaw discusses the pathology findings and reviews the nature of locally advanced breast disease, and reviews the rationale for proceeding with adjuvant to the chest wall and regional lymph nodes to reduce risks of local recurrence followed by antiestrogen therapy. We discussed the risks, benefits, short,  and long term effects of radiotherapy, as  well as the curative intent, and the patient is interested in proceeding. Dr. Lisbeth Renshaw discusses the delivery and logistics of radiotherapy and anticipates a course of  6 1/2 weeks of radiotherapy to the left chest wall and regional nodes with deep inspiration breath-hold technique. She will come in for simulation on Wednesday next week after confirming that she is well-healed enough to proceed with Dr. Brantley Stage on Tuesday, she will signed written consent to proceed at that time. 2. 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 She will be frequently in our office where dispensing stations are located. I sent that short treatment staff to make sure that therapist will be treating her aware of this, and have put a sticky note in her chart so that all in epic in our department can see this at the flags the soon as her chart is open. I encouraged her to remind US of the sensitivities when she encounters our staff as well. She is in agreement to do this.  Given current concerns for patient exposure during the COVID-19 pandemic, this encounter was conducted via telephone.  The patient has provided two factor identification and has given verbal consent for this type of encounter and has been advised to only accept a meeting of this type in a secure network environment. The time spent during this encounter was 45 minutes including preparation, discussion, and coordination of the patient's care. The attendants for this meeting include Dr. Lisbeth Renshaw, Hayden Pedro  and [name].  During the encounter, Dr. Lisbeth Renshaw, and Hayden Pedro were located at [facility] Radiation Oncology Department.  Jaskirat Zertuche Vinton was located at home.   The above documentation reflects my direct findings during this shared patient visit. Please see the  separate note by Dr. Lisbeth Renshaw on this date for the remainder of the patient's plan of care.    Carola Rhine, PAC

## 2020-10-15 ENCOUNTER — Ambulatory Visit: Payer: Medicare HMO | Admitting: Radiation Oncology

## 2020-10-19 ENCOUNTER — Ambulatory Visit: Payer: Medicare HMO | Admitting: Radiation Oncology

## 2020-10-20 ENCOUNTER — Other Ambulatory Visit: Payer: Self-pay

## 2020-10-20 ENCOUNTER — Ambulatory Visit
Admission: RE | Admit: 2020-10-20 | Discharge: 2020-10-20 | Disposition: A | Discharge status: Home / Self Care | Payer: Medicare HMO | Source: Ambulatory Visit | Attending: Radiation Oncology | Admitting: Radiation Oncology

## 2020-10-20 DIAGNOSIS — Z170 Estrogen receptor positive status [ER+]: Secondary | ICD-10-CM | POA: Insufficient documentation

## 2020-10-20 DIAGNOSIS — Z510 Encounter for antineoplastic radiation therapy: Secondary | ICD-10-CM | POA: Insufficient documentation

## 2020-10-20 DIAGNOSIS — C50412 Malignant neoplasm of upper-outer quadrant of left female breast: Secondary | ICD-10-CM | POA: Insufficient documentation

## 2020-10-21 ENCOUNTER — Encounter: Payer: Self-pay | Admitting: *Deleted

## 2020-10-22 ENCOUNTER — Telehealth: Payer: Self-pay | Admitting: Hematology and Oncology

## 2020-10-22 NOTE — Telephone Encounter (Signed)
Scheduled appt per 12/9 sch msg - mailed reminder letter with apt date and time

## 2020-10-26 DIAGNOSIS — Z510 Encounter for antineoplastic radiation therapy: Secondary | ICD-10-CM | POA: Diagnosis not present

## 2020-10-27 ENCOUNTER — Other Ambulatory Visit: Payer: Self-pay

## 2020-10-27 ENCOUNTER — Ambulatory Visit: Admission: RE | Admit: 2020-10-27 | Payer: Medicare HMO | Source: Ambulatory Visit

## 2020-10-27 DIAGNOSIS — Z510 Encounter for antineoplastic radiation therapy: Secondary | ICD-10-CM | POA: Diagnosis not present

## 2020-10-28 ENCOUNTER — Ambulatory Visit
Admission: RE | Admit: 2020-10-28 | Discharge: 2020-10-28 | Disposition: A | Discharge status: Home / Self Care | Payer: Medicare HMO | Source: Ambulatory Visit | Attending: Radiation Oncology | Admitting: Radiation Oncology

## 2020-10-28 DIAGNOSIS — Z510 Encounter for antineoplastic radiation therapy: Secondary | ICD-10-CM | POA: Diagnosis not present

## 2020-10-28 NOTE — Progress Notes (Signed)
Pt here for patient teaching.  Pt given Radiation and You booklet and skin care instructions.  Reviewed areas of pertinence such as fatigue, hair loss, skin changes, breast tenderness and breast swelling . Pt able to give teach back of to pat skin and use unscented/gentle soap,avoid applying anything to skin within 4 hours of treatment, avoid wearing an under wire bra and to use an electric razor if they must shave. Pt verbalizes understanding of information given and will contact nursing with any questions or concerns.     Gloriajean Dell. Leonie Green, BSN

## 2020-10-29 ENCOUNTER — Ambulatory Visit
Admission: RE | Admit: 2020-10-29 | Discharge: 2020-10-29 | Disposition: A | Discharge status: Home / Self Care | Payer: Medicare HMO | Source: Ambulatory Visit | Attending: Radiation Oncology | Admitting: Radiation Oncology

## 2020-10-29 DIAGNOSIS — Z510 Encounter for antineoplastic radiation therapy: Secondary | ICD-10-CM | POA: Diagnosis not present

## 2020-11-01 ENCOUNTER — Ambulatory Visit
Admission: RE | Admit: 2020-11-01 | Discharge: 2020-11-01 | Disposition: A | Discharge status: Home / Self Care | Payer: Medicare HMO | Source: Ambulatory Visit | Attending: Radiation Oncology | Admitting: Radiation Oncology

## 2020-11-01 DIAGNOSIS — Z510 Encounter for antineoplastic radiation therapy: Secondary | ICD-10-CM | POA: Diagnosis not present

## 2020-11-02 ENCOUNTER — Ambulatory Visit
Admission: RE | Admit: 2020-11-02 | Discharge: 2020-11-02 | Disposition: A | Discharge status: Home / Self Care | Payer: Medicare HMO | Source: Ambulatory Visit | Attending: Radiation Oncology | Admitting: Radiation Oncology

## 2020-11-02 DIAGNOSIS — Z510 Encounter for antineoplastic radiation therapy: Secondary | ICD-10-CM | POA: Diagnosis not present

## 2020-11-03 ENCOUNTER — Ambulatory Visit
Admission: RE | Admit: 2020-11-03 | Discharge: 2020-11-03 | Disposition: A | Discharge status: Home / Self Care | Payer: Medicare HMO | Source: Ambulatory Visit | Attending: Radiation Oncology | Admitting: Radiation Oncology

## 2020-11-03 ENCOUNTER — Other Ambulatory Visit: Payer: Self-pay

## 2020-11-03 DIAGNOSIS — Z510 Encounter for antineoplastic radiation therapy: Secondary | ICD-10-CM | POA: Diagnosis not present

## 2020-11-04 ENCOUNTER — Other Ambulatory Visit: Payer: Self-pay

## 2020-11-04 ENCOUNTER — Ambulatory Visit
Admission: RE | Admit: 2020-11-04 | Discharge: 2020-11-04 | Disposition: A | Discharge status: Home / Self Care | Payer: Medicare HMO | Source: Ambulatory Visit | Attending: Radiation Oncology | Admitting: Radiation Oncology

## 2020-11-04 DIAGNOSIS — Z510 Encounter for antineoplastic radiation therapy: Secondary | ICD-10-CM | POA: Diagnosis not present

## 2020-11-08 ENCOUNTER — Ambulatory Visit
Admission: RE | Admit: 2020-11-08 | Discharge: 2020-11-08 | Disposition: A | Discharge status: Home / Self Care | Payer: Medicare HMO | Source: Ambulatory Visit | Attending: Radiation Oncology | Admitting: Radiation Oncology

## 2020-11-08 DIAGNOSIS — Z510 Encounter for antineoplastic radiation therapy: Secondary | ICD-10-CM | POA: Diagnosis not present

## 2020-11-09 ENCOUNTER — Ambulatory Visit
Admission: RE | Admit: 2020-11-09 | Discharge: 2020-11-09 | Disposition: A | Discharge status: Home / Self Care | Payer: Medicare HMO | Source: Ambulatory Visit | Attending: Radiation Oncology | Admitting: Radiation Oncology

## 2020-11-09 DIAGNOSIS — Z510 Encounter for antineoplastic radiation therapy: Secondary | ICD-10-CM | POA: Diagnosis not present

## 2020-11-10 ENCOUNTER — Ambulatory Visit
Admission: RE | Admit: 2020-11-10 | Discharge: 2020-11-10 | Disposition: A | Discharge status: Home / Self Care | Payer: Medicare HMO | Source: Ambulatory Visit | Attending: Radiation Oncology | Admitting: Radiation Oncology

## 2020-11-10 DIAGNOSIS — Z510 Encounter for antineoplastic radiation therapy: Secondary | ICD-10-CM | POA: Diagnosis not present

## 2020-11-11 ENCOUNTER — Ambulatory Visit: Payer: Medicare HMO

## 2020-11-15 ENCOUNTER — Ambulatory Visit
Admission: RE | Admit: 2020-11-15 | Discharge: 2020-11-15 | Disposition: A | Discharge status: Home / Self Care | Payer: Medicare HMO | Source: Ambulatory Visit | Attending: Radiation Oncology | Admitting: Radiation Oncology

## 2020-11-15 ENCOUNTER — Other Ambulatory Visit: Payer: Self-pay

## 2020-11-15 DIAGNOSIS — C50412 Malignant neoplasm of upper-outer quadrant of left female breast: Secondary | ICD-10-CM | POA: Diagnosis not present

## 2020-11-15 DIAGNOSIS — Z510 Encounter for antineoplastic radiation therapy: Secondary | ICD-10-CM | POA: Insufficient documentation

## 2020-11-15 DIAGNOSIS — Z170 Estrogen receptor positive status [ER+]: Secondary | ICD-10-CM | POA: Diagnosis not present

## 2020-11-16 ENCOUNTER — Ambulatory Visit
Admission: RE | Admit: 2020-11-16 | Discharge: 2020-11-16 | Disposition: A | Discharge status: Home / Self Care | Payer: Medicare HMO | Source: Ambulatory Visit | Attending: Radiation Oncology | Admitting: Radiation Oncology

## 2020-11-16 DIAGNOSIS — Z510 Encounter for antineoplastic radiation therapy: Secondary | ICD-10-CM | POA: Diagnosis not present

## 2020-11-17 ENCOUNTER — Ambulatory Visit
Admission: RE | Admit: 2020-11-17 | Discharge: 2020-11-17 | Disposition: A | Discharge status: Home / Self Care | Payer: Medicare HMO | Source: Ambulatory Visit | Attending: Radiation Oncology | Admitting: Radiation Oncology

## 2020-11-17 DIAGNOSIS — Z510 Encounter for antineoplastic radiation therapy: Secondary | ICD-10-CM | POA: Diagnosis not present

## 2020-11-18 ENCOUNTER — Ambulatory Visit
Admission: RE | Admit: 2020-11-18 | Discharge: 2020-11-18 | Disposition: A | Discharge status: Home / Self Care | Payer: Medicare HMO | Source: Ambulatory Visit | Attending: Radiation Oncology | Admitting: Radiation Oncology

## 2020-11-18 ENCOUNTER — Other Ambulatory Visit: Payer: Self-pay

## 2020-11-18 DIAGNOSIS — Z510 Encounter for antineoplastic radiation therapy: Secondary | ICD-10-CM | POA: Diagnosis not present

## 2020-11-19 ENCOUNTER — Other Ambulatory Visit: Payer: Self-pay

## 2020-11-19 ENCOUNTER — Ambulatory Visit
Admission: RE | Admit: 2020-11-19 | Discharge: 2020-11-19 | Disposition: A | Discharge status: Home / Self Care | Payer: Medicare HMO | Source: Ambulatory Visit | Attending: Radiation Oncology | Admitting: Radiation Oncology

## 2020-11-19 DIAGNOSIS — Z510 Encounter for antineoplastic radiation therapy: Secondary | ICD-10-CM | POA: Diagnosis not present

## 2020-11-22 ENCOUNTER — Other Ambulatory Visit: Payer: Self-pay

## 2020-11-22 ENCOUNTER — Ambulatory Visit
Admission: RE | Admit: 2020-11-22 | Discharge: 2020-11-22 | Disposition: A | Discharge status: Home / Self Care | Payer: Medicare HMO | Source: Ambulatory Visit | Attending: Radiation Oncology | Admitting: Radiation Oncology

## 2020-11-22 DIAGNOSIS — Z510 Encounter for antineoplastic radiation therapy: Secondary | ICD-10-CM | POA: Diagnosis not present

## 2020-11-23 ENCOUNTER — Ambulatory Visit
Admission: RE | Admit: 2020-11-23 | Discharge: 2020-11-23 | Disposition: A | Discharge status: Home / Self Care | Payer: Medicare HMO | Source: Ambulatory Visit | Attending: Radiation Oncology | Admitting: Radiation Oncology

## 2020-11-23 DIAGNOSIS — Z510 Encounter for antineoplastic radiation therapy: Secondary | ICD-10-CM | POA: Diagnosis not present

## 2020-11-24 ENCOUNTER — Ambulatory Visit
Admission: RE | Admit: 2020-11-24 | Discharge: 2020-11-24 | Disposition: A | Discharge status: Home / Self Care | Payer: Medicare HMO | Source: Ambulatory Visit | Attending: Radiation Oncology | Admitting: Radiation Oncology

## 2020-11-24 DIAGNOSIS — Z510 Encounter for antineoplastic radiation therapy: Secondary | ICD-10-CM | POA: Diagnosis not present

## 2020-11-25 ENCOUNTER — Ambulatory Visit
Admission: RE | Admit: 2020-11-25 | Discharge: 2020-11-25 | Disposition: A | Discharge status: Home / Self Care | Payer: Medicare HMO | Source: Ambulatory Visit | Attending: Radiation Oncology | Admitting: Radiation Oncology

## 2020-11-25 DIAGNOSIS — Z510 Encounter for antineoplastic radiation therapy: Secondary | ICD-10-CM | POA: Diagnosis not present

## 2020-11-26 ENCOUNTER — Other Ambulatory Visit: Payer: Self-pay

## 2020-11-26 ENCOUNTER — Ambulatory Visit
Admission: RE | Admit: 2020-11-26 | Discharge: 2020-11-26 | Disposition: A | Discharge status: Home / Self Care | Payer: Medicare HMO | Source: Ambulatory Visit | Attending: Radiation Oncology | Admitting: Radiation Oncology

## 2020-11-26 ENCOUNTER — Encounter: Payer: Self-pay | Admitting: Radiation Oncology

## 2020-11-26 ENCOUNTER — Ambulatory Visit: Payer: Medicare HMO | Admitting: Radiation Oncology

## 2020-11-26 DIAGNOSIS — Z510 Encounter for antineoplastic radiation therapy: Secondary | ICD-10-CM | POA: Diagnosis not present

## 2020-11-29 ENCOUNTER — Ambulatory Visit: Payer: Medicare HMO

## 2020-11-30 ENCOUNTER — Other Ambulatory Visit: Payer: Self-pay

## 2020-11-30 ENCOUNTER — Ambulatory Visit: Payer: Medicare HMO

## 2020-11-30 ENCOUNTER — Ambulatory Visit
Admission: RE | Admit: 2020-11-30 | Discharge: 2020-11-30 | Disposition: A | Discharge status: Home / Self Care | Payer: Medicare HMO | Source: Ambulatory Visit | Attending: Radiation Oncology | Admitting: Radiation Oncology

## 2020-11-30 DIAGNOSIS — Z510 Encounter for antineoplastic radiation therapy: Secondary | ICD-10-CM | POA: Diagnosis not present

## 2020-12-01 ENCOUNTER — Ambulatory Visit
Admission: RE | Admit: 2020-12-01 | Discharge: 2020-12-01 | Disposition: A | Discharge status: Home / Self Care | Payer: Medicare HMO | Source: Ambulatory Visit | Attending: Radiation Oncology | Admitting: Radiation Oncology

## 2020-12-01 DIAGNOSIS — Z510 Encounter for antineoplastic radiation therapy: Secondary | ICD-10-CM | POA: Diagnosis not present

## 2020-12-02 ENCOUNTER — Ambulatory Visit
Admission: RE | Admit: 2020-12-02 | Discharge: 2020-12-02 | Disposition: A | Discharge status: Home / Self Care | Payer: Medicare HMO | Source: Ambulatory Visit | Attending: Radiation Oncology | Admitting: Radiation Oncology

## 2020-12-02 DIAGNOSIS — Z510 Encounter for antineoplastic radiation therapy: Secondary | ICD-10-CM | POA: Diagnosis not present

## 2020-12-03 ENCOUNTER — Ambulatory Visit: Payer: Medicare HMO | Admitting: Radiation Oncology

## 2020-12-03 ENCOUNTER — Other Ambulatory Visit: Payer: Self-pay

## 2020-12-03 ENCOUNTER — Ambulatory Visit
Admission: RE | Admit: 2020-12-03 | Discharge: 2020-12-03 | Disposition: A | Discharge status: Home / Self Care | Payer: Medicare HMO | Source: Ambulatory Visit | Attending: Radiation Oncology | Admitting: Radiation Oncology

## 2020-12-03 DIAGNOSIS — Z510 Encounter for antineoplastic radiation therapy: Secondary | ICD-10-CM | POA: Diagnosis not present

## 2020-12-05 NOTE — Progress Notes (Signed)
Patient Care Team: [name], MD as PCP - General (Family Medicine) [name], RN as Oncology Nurse Navigator [name], RN as Oncology Nurse Navigator  DIAGNOSIS:    ICD-10-CM   1. Malignant neoplasm of upper-outer quadrant of left breast in female, estrogen receptor positive ([place])  C50.412    Z17.0     SUMMARY OF ONCOLOGIC HISTORY: Oncology History  Malignant neoplasm of upper-outer quadrant of left breast in female, estrogen receptor positive ([place])  03/22/2020 Initial Diagnosis   Diffuse left breast swelling. skin thickening and two areas of irregular hypoechogenicity in the 2-3 o'clock region, 4.9cm, and at least 4 abnormal lymph nodes  with cortical thickening. 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, grade 2, HER-2 negative (1+), ER+ 30%, PR+ 10%, Ki67 15%   04/07/2020 Cancer Staging   Staging form: Breast, AJCC 8th Edition - Clinical stage from 04/07/2020: Stage IIIB (cT4b, cN1, cM0, G2, ER+, PR+, HER2-) - Signed by Nicholas Lose, MD on 04/07/2020   04/19/2020 -  Chemotherapy   The patient had palonosetron (ALOXI) injection 0.25 mg, 0.25 mg, Intravenous,  Once, 6 of 6 cycles Administration: 0.25 mg (04/19/2020), 0.25 mg (05/10/2020), 0.25 mg (05/31/2020), 0.25 mg (06/21/2020), 0.25 mg (07/12/2020), 0.25 mg (08/02/2020) methotrexate (PF) chemo injection 50 mg, 29.8 mg/m2 = 50.5 mg (100 % of original dose 30 mg/m2), Intravenous,  Once, 6 of 6 cycles Dose modification: 30 mg/m2 (original dose 30 mg/m2, Cycle 1, Reason: Provider Judgment), 25 mg/m2 (original dose 30 mg/m2, Cycle 3, Reason: Provider Judgment) Administration: 50 mg (04/19/2020), 50 mg (05/10/2020), 42 mg (05/31/2020), 42 mg (06/21/2020), 42 mg (07/12/2020), 42 mg (08/02/2020) cyclophosphamide (CYTOXAN) 840 mg in sodium chloride 0.9 % 250 mL chemo infusion, 500 mg/m2 = 840 mg (100 % of original dose 500 mg/m2), Intravenous,  Once, 6 of 6 cycles Dose modification: 500 mg/m2 (original dose  500 mg/m2, Cycle 1, Reason: Provider Judgment), 400 mg/m2 (original dose 500 mg/m2, Cycle 3, Reason: Provider Judgment) Administration: 840 mg (04/19/2020), 840 mg (05/10/2020), 680 mg (05/31/2020), 680 mg (06/21/2020), 680 mg (07/12/2020), 680 mg (08/02/2020) fluorouracil (ADRUCIL) chemo injection 850 mg, 500 mg/m2 = 850 mg (100 % of original dose 500 mg/m2), Intravenous,  Once, 6 of 6 cycles Dose modification: 500 mg/m2 (original dose 500 mg/m2, Cycle 1, Reason: Provider Judgment) Administration: 850 mg (04/19/2020), 850 mg (05/10/2020), 850 mg (05/31/2020), 850 mg (06/21/2020), 850 mg (07/12/2020), 850 mg (08/02/2020)  for chemotherapy treatment.    09/14/2020 Surgery   Left mastectomy ([name]): invasive and in situ ductal carcinoma, grade 2, 15cm, clear margins, with 14/14 lymph nodes positive for metastatic carcinoma with extracapsular extension.    10/28/2020 - 12/17/2020 Radiation Therapy   Adjuvant radiation     CHIEF COMPLIANT: Follow-up to discuss antiestrogen therapy  INTERVAL HISTORY: Colleen Lowery is a 80 y.o. with above-mentioned history of left breast cancer who completed neoadjuvant chemotherapy, underwent a left mastectomy, and is currently on radiation. She presents to the clinic todayto discuss antiestrogen therapy.  She has mild radiation dermatitis but otherwise tolerating radiation extremely well.  ALLERGIES:  is allergic to contrast media [iodinated diagnostic agents], purell instant hand [alcohol], atorvastatin, dust mite extract, latex, and molds & smuts.  MEDICATIONS:  Current Outpatient Medications  Medication Sig Dispense Refill  . Ascorbic Acid (VITAMIN C PO) Take 1,000 mg by mouth 3 (three) times a week.     [name] b complex vitamins tablet Take 1 tablet by mouth 3 (three)  times a week.     . Beta Carotene (VITAMIN A) 25000 UNIT capsule Take 25,000 Units by mouth daily.     [name] CALCIUM PO Take 1 tablet by mouth daily.     . Cholecalciferol (VITAMIN D3) 5000 units CAPS Take 5,000  Units by mouth daily.     . furosemide (LASIX) 20 MG tablet Take 20 mg by mouth daily.    [name] lidocaine-prilocaine (EMLA) cream Apply 1 application topically daily as needed (access port). (Patient not taking: Reported on 10/14/2020) 30 g 0  . Liver Extract (LIVER PO) Take 1 tablet by mouth 3 (three) times a week.     . losartan (COZAAR) 50 MG tablet Take 50 mg by mouth daily.    [name] MAGNESIUM PO Take 1 tablet by mouth daily.     . methocarbamol (ROBAXIN) 500 MG tablet Take 1 tablet (500 mg total) by mouth every 6 (six) hours as needed for muscle spasms. (Patient not taking: Reported on 10/14/2020) 20 tablet 0  . Misc Natural Products (DUODENUM) TABS Take 1-5 tablets by mouth daily as needed (stomach swelling).    . nitroGLYCERIN (NITROSTAT) 0.4 MG SL tablet Place 1 tablet (0.4 mg total) under the tongue every 5 (five) minutes x 3 doses as needed for chest pain. (Patient not taking: Reported on 10/14/2020) 25 tablet 12  . ondansetron (ZOFRAN) 8 MG tablet Take 1 tablet (8 mg total) by mouth 2 (two) times daily as needed for refractory nausea / vomiting. Start on day 3 after chemotherapy. (Patient not taking: Reported on 10/14/2020) 30 tablet 1  . OVER THE COUNTER MEDICATION Take 1-5 tablets by mouth daily as needed (itching). Vitamin b5     . OVER THE COUNTER MEDICATION Take 5-10 tablets by mouth as needed (mold induced itching). SOD superoxide dismutase  (Patient not taking: Reported on 10/14/2020)    . OVER THE COUNTER MEDICATION Take 10 tablets by mouth daily as needed (constipation). Mega-zyme digestive enzyme    . PANTETHINE PO Take 1-2 tablets by mouth daily.    . potassium chloride SA (K-DUR,KLOR-CON) 20 MEQ tablet Take 20 mEq by mouth 2 (two) times daily.  (Patient not taking: Reported on 10/14/2020)    . prochlorperazine (COMPAZINE) 10 MG tablet Take 1 tablet (10 mg total) by mouth every 6 (six) hours as needed (Nausea or vomiting). (Patient not taking: Reported on 10/14/2020) 30 tablet 1  . sotalol  (BETAPACE) 80 MG tablet Take 1 tablet (80 mg total) by mouth 2 (two) times daily. 60 tablet 6  . traMADol (ULTRAM) 50 MG tablet Take 1 tablet (50 mg total) by mouth every 6 (six) hours as needed (mild pain). (Patient not taking: Reported on 10/14/2020) 30 tablet 0  . vitamin E 400 UNIT capsule Take 400 Units by mouth 3 (three) times a week.  (Patient not taking: Reported on 10/14/2020)     No current facility-administered medications for this visit.    PHYSICAL EXAMINATION: ECOG PERFORMANCE STATUS: 1 - Symptomatic but completely ambulatory  Vitals:   12/06/20 1431  BP: (!) 175/93  Pulse: 86  Resp: 17  Temp: 97.9 F (36.6 C)  SpO2: 100%   Filed Weights   12/06/20 1431  Weight: 138 lb 9.6 oz (62.9 kg)     LABORATORY DATA:  I have reviewed the data as listed CMP Latest Ref Rng & Units 09/15/2020 09/15/2020 09/06/2020  Glucose 70 - 99 mg/dL 126(H) 836(HH) 147(H)  BUN 8 - 23 mg/dL $Remove'13 12 12  'dXQfNrt$ Creatinine 0.44 -  1.00 mg/dL 0.84 0.78 0.77  Sodium 135 - 145 mmol/L 138 140 139  Potassium 3.5 - 5.1 mmol/L 4.7 3.7 4.1  Chloride 98 - 111 mmol/L 106 114(H) 112(H)  CO2 22 - 32 mmol/L 23 17(L) 18(L)  Calcium 8.9 - 10.3 mg/dL 8.8(L) 7.2(L) 9.5  Total Protein 6.5 - 8.1 g/dL 5.8(L) 4.7(L) 6.8  Total Bilirubin 0.3 - 1.2 mg/dL 0.6 0.5 0.8  Alkaline Phos 38 - 126 U/L 85 70 93  AST 15 - 41 U/L $Remo'18 15 17  'wjZYN$ ALT 0 - 44 U/L $Remo'10 10 11    'IYJwr$ Lab Results  Component Value Date   WBC 10.1 09/15/2020   HGB 12.3 09/15/2020   HCT 36.8 09/15/2020   MCV 94.6 09/15/2020   PLT 209 09/15/2020   NEUTROABS 2.8 09/06/2020    ASSESSMENT & PLAN:  Malignant neoplasm of upper-outer quadrant of left breast in female, estrogen receptor positive ([facility]) /08/2020:diffuse left breast swelling. skin thickening and two areas of irregular hypoechogenicity in the 2-3 o'clock region, 4.9cm, and at least 4 abnormal lymph nodes with cortical thickening. 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, grade  2, HER-2 negative (1+), ER+ 30%, PR+ 10%, Ki67 15% T4BN1 stage IIIb clinical stage Skin invasion versus inflammatory breast cancer 04/13/2020: CT CAP and bone scan:Left axillary lymphadenopathy with thickening and nodularity of the left breast, subtle groundglass nodule right lung base, occlusion of left superficial femoral artery, upper pole left kidney lesion likely a cyst. Subacute and chronic rib fractures right chest. No evidence of metastatic disease.  Treatment plan: 1.Neoadjuvant chemotherapywith CMF 2.Mastectomy with axillary lymph node dissection 3.Adjuvant radiation 10/28/2020-12/17/2020 4.Followed by adjuvant antiestrogen therapy ------------------------------------------------------------------------------------------------------------------------------------------- 09/14/2020:Left mastectomy ([name]): invasive and in situ ductal carcinoma, grade 2, 15cm, clear margins, with 14/14 lymph nodes positive for metastatic carcinoma with extracapsular extension.  ER 30%, PR 10%, HER-2 negative, Ki-67 15%  Anastrozole counseling:We discussed the risks and benefits of anti-estrogen therapy with aromatase inhibitors. These include but not limited to insomnia, hot flashes, mood changes, vaginal dryness, bone density loss, and weight gain. We strongly believe that the benefits far outweigh the risks. Patient understands these risks and consented to starting treatment. Planned treatment duration is 7 years.  In 2008 she had a bone density showing osteoporosis. I would like to recheck it again.  Return to clinic in 3 months for survivorship care plan visit   No orders of the defined types were placed in this encounter.  The patient has a good understanding of the overall plan. she agrees with it. she will call with any problems that may develop before the next visit here.  Total time spent: 30 mins including face to face time and time spent for planning, charting and coordination of  care  Nicholas Lose, MD 12/06/2020  I, Cloyde Reams Dorshimer, am acting as scribe for Dr. Nicholas Lose.  I have reviewed the above documentation for accuracy and completeness, and I agree with the above.

## 2020-12-06 ENCOUNTER — Other Ambulatory Visit: Payer: Self-pay

## 2020-12-06 ENCOUNTER — Ambulatory Visit
Admission: RE | Admit: 2020-12-06 | Discharge: 2020-12-06 | Disposition: A | Discharge status: Home / Self Care | Payer: Medicare HMO | Source: Ambulatory Visit | Attending: Radiation Oncology | Admitting: Radiation Oncology

## 2020-12-06 ENCOUNTER — Other Ambulatory Visit: Payer: Self-pay | Admitting: Hematology and Oncology

## 2020-12-06 ENCOUNTER — Inpatient Hospital Stay: Payer: Medicare HMO | Attending: Hematology and Oncology | Admitting: Hematology and Oncology

## 2020-12-06 VITALS — BP 175/93 | HR 86 | Temp 97.9°F | Resp 17 | Ht 62.0 in | Wt 138.6 lb

## 2020-12-06 DIAGNOSIS — Z923 Personal history of irradiation: Secondary | ICD-10-CM | POA: Insufficient documentation

## 2020-12-06 DIAGNOSIS — Z9012 Acquired absence of left breast and nipple: Secondary | ICD-10-CM | POA: Insufficient documentation

## 2020-12-06 DIAGNOSIS — Z79899 Other long term (current) drug therapy: Secondary | ICD-10-CM | POA: Insufficient documentation

## 2020-12-06 DIAGNOSIS — C50412 Malignant neoplasm of upper-outer quadrant of left female breast: Secondary | ICD-10-CM | POA: Diagnosis not present

## 2020-12-06 DIAGNOSIS — M818 Other osteoporosis without current pathological fracture: Secondary | ICD-10-CM | POA: Diagnosis not present

## 2020-12-06 DIAGNOSIS — M810 Age-related osteoporosis without current pathological fracture: Secondary | ICD-10-CM

## 2020-12-06 DIAGNOSIS — Z510 Encounter for antineoplastic radiation therapy: Secondary | ICD-10-CM | POA: Diagnosis not present

## 2020-12-06 DIAGNOSIS — Z170 Estrogen receptor positive status [ER+]: Secondary | ICD-10-CM

## 2020-12-06 MED ORDER — ANASTROZOLE 1 MG PO TABS: 1.0000 mg | ORAL_TABLET | Freq: Every day | ORAL | 3 refills | Status: DC

## 2020-12-06 NOTE — Assessment & Plan Note (Signed)
/  08/2020:diffuse left breast swelling. skin thickening and two areas of irregular hypoechogenicity in the 2-3 o'clock region, 4.9cm, and at least 4 abnormal lymph nodes with cortical thickening. 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 Subacute and chronic rib fractures right chest. No evidence of metastatic disease.  Treatment plan: 1.Neoadjuvant chemotherapywith CMF 2.Mastectomy with axillary lymph node dissection 3.Adjuvant radiation 10/28/2020-12/17/2020 4.Followed by adjuvant antiestrogen therapy ------------------------------------------------------------------------------------------------------------------------------------------- 09/14/2020:Left mastectomy ([name]): invasive and in situ ductal carcinoma, grade 2, 15cm, clear margins, with 14/14 lymph nodes positive for metastatic carcinoma with extracapsular extension.  ER 30%, PR 10%, HER-2 negative, Ki-67 15%  Anastrozole counseling:We discussed the risks and benefits of anti-estrogen therapy with aromatase inhibitors. These include but not limited to insomnia, hot flashes, mood changes, vaginal dryness, bone density loss, and weight gain. We strongly believe that the benefits far outweigh the risks. Patient understands these risks and consented to starting treatment. Planned treatment duration is 7 years.  Return to clinic in 3 months for survivorship care plan visit

## 2020-12-07 ENCOUNTER — Ambulatory Visit
Admission: RE | Admit: 2020-12-07 | Discharge: 2020-12-07 | Disposition: A | Discharge status: Home / Self Care | Payer: Medicare HMO | Source: Ambulatory Visit | Attending: Radiation Oncology | Admitting: Radiation Oncology

## 2020-12-07 DIAGNOSIS — Z510 Encounter for antineoplastic radiation therapy: Secondary | ICD-10-CM | POA: Diagnosis not present

## 2020-12-07 MED ORDER — CYANOCOBALAMIN 1000 MCG/ML IJ SOLN
INTRAMUSCULAR | Status: AC
  Filled 2020-12-07: qty 1

## 2020-12-08 ENCOUNTER — Other Ambulatory Visit: Payer: Self-pay

## 2020-12-08 ENCOUNTER — Ambulatory Visit
Admission: RE | Admit: 2020-12-08 | Discharge: 2020-12-08 | Disposition: A | Discharge status: Home / Self Care | Payer: Medicare HMO | Source: Ambulatory Visit | Attending: Radiation Oncology | Admitting: Radiation Oncology

## 2020-12-08 DIAGNOSIS — Z510 Encounter for antineoplastic radiation therapy: Secondary | ICD-10-CM | POA: Diagnosis not present

## 2020-12-09 ENCOUNTER — Ambulatory Visit
Admission: RE | Admit: 2020-12-09 | Discharge: 2020-12-09 | Disposition: A | Discharge status: Home / Self Care | Payer: Medicare HMO | Source: Ambulatory Visit | Attending: Radiation Oncology | Admitting: Radiation Oncology

## 2020-12-09 ENCOUNTER — Other Ambulatory Visit: Payer: Self-pay

## 2020-12-09 ENCOUNTER — Ambulatory Visit: Payer: Medicare HMO

## 2020-12-09 ENCOUNTER — Telehealth: Payer: Self-pay | Admitting: Hematology and Oncology

## 2020-12-09 DIAGNOSIS — Z510 Encounter for antineoplastic radiation therapy: Secondary | ICD-10-CM | POA: Diagnosis not present

## 2020-12-09 NOTE — Telephone Encounter (Signed)
Scheduled per 1/24 los. Called and left pt a msg

## 2020-12-10 ENCOUNTER — Ambulatory Visit
Admission: RE | Admit: 2020-12-10 | Discharge: 2020-12-10 | Disposition: A | Discharge status: Home / Self Care | Payer: Medicare HMO | Source: Ambulatory Visit | Attending: Radiation Oncology | Admitting: Radiation Oncology

## 2020-12-10 ENCOUNTER — Other Ambulatory Visit: Payer: Self-pay

## 2020-12-10 ENCOUNTER — Ambulatory Visit: Payer: Medicare HMO

## 2020-12-10 ENCOUNTER — Ambulatory Visit: Payer: Medicare HMO | Admitting: Radiation Oncology

## 2020-12-10 DIAGNOSIS — Z510 Encounter for antineoplastic radiation therapy: Secondary | ICD-10-CM | POA: Diagnosis not present

## 2020-12-13 ENCOUNTER — Ambulatory Visit
Admission: RE | Admit: 2020-12-13 | Discharge: 2020-12-13 | Disposition: A | Discharge status: Home / Self Care | Payer: Medicare HMO | Source: Ambulatory Visit | Attending: Radiation Oncology | Admitting: Radiation Oncology

## 2020-12-13 ENCOUNTER — Ambulatory Visit: Payer: Medicare HMO

## 2020-12-13 ENCOUNTER — Other Ambulatory Visit: Payer: Self-pay

## 2020-12-13 DIAGNOSIS — Z510 Encounter for antineoplastic radiation therapy: Secondary | ICD-10-CM | POA: Diagnosis not present

## 2020-12-14 ENCOUNTER — Ambulatory Visit: Payer: Medicare HMO

## 2020-12-14 ENCOUNTER — Ambulatory Visit
Admission: RE | Admit: 2020-12-14 | Discharge: 2020-12-14 | Disposition: A | Discharge status: Home / Self Care | Payer: Medicare HMO | Source: Ambulatory Visit | Attending: Radiation Oncology | Admitting: Radiation Oncology

## 2020-12-14 ENCOUNTER — Encounter: Payer: Self-pay | Admitting: *Deleted

## 2020-12-14 DIAGNOSIS — C50412 Malignant neoplasm of upper-outer quadrant of left female breast: Secondary | ICD-10-CM | POA: Diagnosis not present

## 2020-12-14 DIAGNOSIS — Z510 Encounter for antineoplastic radiation therapy: Secondary | ICD-10-CM | POA: Diagnosis present

## 2020-12-14 DIAGNOSIS — Z170 Estrogen receptor positive status [ER+]: Secondary | ICD-10-CM | POA: Insufficient documentation

## 2020-12-15 ENCOUNTER — Ambulatory Visit
Admission: RE | Admit: 2020-12-15 | Discharge: 2020-12-15 | Disposition: A | Discharge status: Home / Self Care | Payer: Medicare HMO | Source: Ambulatory Visit | Attending: Radiation Oncology | Admitting: Radiation Oncology

## 2020-12-15 ENCOUNTER — Ambulatory Visit: Payer: Medicare HMO

## 2020-12-15 DIAGNOSIS — Z510 Encounter for antineoplastic radiation therapy: Secondary | ICD-10-CM | POA: Diagnosis not present

## 2020-12-16 ENCOUNTER — Other Ambulatory Visit: Payer: Self-pay

## 2020-12-16 ENCOUNTER — Ambulatory Visit
Admission: RE | Admit: 2020-12-16 | Discharge: 2020-12-16 | Disposition: A | Discharge status: Home / Self Care | Payer: Medicare HMO | Source: Ambulatory Visit | Attending: Radiation Oncology | Admitting: Radiation Oncology

## 2020-12-16 ENCOUNTER — Encounter: Payer: Self-pay | Admitting: *Deleted

## 2020-12-16 ENCOUNTER — Ambulatory Visit: Payer: Medicare HMO

## 2020-12-16 DIAGNOSIS — Z510 Encounter for antineoplastic radiation therapy: Secondary | ICD-10-CM | POA: Diagnosis not present

## 2020-12-17 ENCOUNTER — Ambulatory Visit: Payer: Medicare HMO

## 2020-12-17 ENCOUNTER — Ambulatory Visit
Admission: RE | Admit: 2020-12-17 | Discharge: 2020-12-17 | Disposition: A | Discharge status: Home / Self Care | Payer: Medicare HMO | Source: Ambulatory Visit | Attending: Radiation Oncology | Admitting: Radiation Oncology

## 2020-12-17 ENCOUNTER — Other Ambulatory Visit: Payer: Self-pay

## 2020-12-17 DIAGNOSIS — Z510 Encounter for antineoplastic radiation therapy: Secondary | ICD-10-CM | POA: Diagnosis not present

## 2020-12-17 NOTE — Progress Notes (Signed)
  Radiation Oncology         [phone] ________________________________  Name: Colleen Lowery MRN: [id number]  Date: 11/26/2020  DOB: Jan 01, 1941  SIMULATION NOTE   NARRATIVE:  The patient underwent simulation today for ongoing radiation therapy.  The existing CT study set was employed for the purpose of virtual treatment planning.  The target and avoidance structures were reviewed and modified as necessary.  Treatment planning then occurred.  The radiation boost prescription was entered and confirmed.  A total of 1 complex treatment devices were fabricated in the form of multi-leaf collimators to shape radiation around the targets while maximally excluding nearby normal structures. I have requested : Isodose Plan.    PLAN:  This modified radiation beam arrangement is intended to continue the current radiation dose to an additional 10 Gy in 5 fractions for a total cumulative dose of 60.4 Gy.    ------------------------------------------------  Jodelle Gross, MD, PhD

## 2020-12-20 ENCOUNTER — Ambulatory Visit: Payer: Medicare HMO

## 2020-12-20 ENCOUNTER — Encounter: Payer: Self-pay | Admitting: Radiation Oncology

## 2020-12-20 NOTE — Progress Notes (Signed)
  Patient Name: Colleen Lowery MRN: [id number] DOB: 09-14-1941 Referring Physician: Lavera Guise (Profile Not Attached) Date of Service: 12/17/2020 [facility], [place]                                                        End Of Treatment Note  Diagnoses: C50.412-Malignant neoplasm of upper-outer quadrant of left female breast  Cancer Staging: Stage IIIB, cT4N1M0, grade 2, ER/PR positive invasive ductal carcinoma of the left breast.  Intent: Curative  Radiation Treatment Dates: 10/28/2020 through 12/17/2020 Site Technique Total Dose (Gy) Dose per Fx (Gy) Completed Fx Beam Energies  Chest Wall, Left: CW_Lt 3D 50.4/50.4 1.8 28/28 6X, 10X  Chest Wall, Left: CW_Lt_SCLV 3D 50.4/50.4 1.8 28/28 6X, 10X  Chest Wall, Left: CW_Lt_Bst Electron 10/10 2 5/5 9E   Narrative: The patient tolerated radiation therapy relatively well. She did develop hyperpigmentation but no blistering or desquamation at the conclusion of her treatment.  Plan: The patient will receive a call in about one month from the radiation oncology department. She will continue follow up with Dr. Lindi Adie as well.  ________________________________________________    Carola Rhine, [facility]

## 2020-12-22 ENCOUNTER — Ambulatory Visit (HOSPITAL_BASED_OUTPATIENT_CLINIC_OR_DEPARTMENT_OTHER)
Admission: RE | Admit: 2020-12-22 | Discharge: 2020-12-22 | Disposition: A | Discharge status: Home / Self Care | Payer: Medicare HMO | Source: Ambulatory Visit | Attending: Hematology and Oncology | Admitting: Hematology and Oncology

## 2020-12-22 ENCOUNTER — Other Ambulatory Visit: Payer: Self-pay

## 2020-12-22 DIAGNOSIS — M810 Age-related osteoporosis without current pathological fracture: Secondary | ICD-10-CM

## 2020-12-28 ENCOUNTER — Ambulatory Visit (INDEPENDENT_AMBULATORY_CARE_PROVIDER_SITE_OTHER): Payer: Medicare HMO

## 2020-12-28 ENCOUNTER — Ambulatory Visit (HOSPITAL_COMMUNITY)
Admission: EM | Admit: 2020-12-28 | Discharge: 2020-12-28 | Disposition: A | Discharge status: Home / Self Care | Payer: Medicare HMO | Attending: Student | Admitting: Student

## 2020-12-28 ENCOUNTER — Other Ambulatory Visit: Payer: Self-pay

## 2020-12-28 ENCOUNTER — Encounter (HOSPITAL_COMMUNITY): Payer: Self-pay | Admitting: Emergency Medicine

## 2020-12-28 DIAGNOSIS — M79641 Pain in right hand: Secondary | ICD-10-CM

## 2020-12-28 DIAGNOSIS — S61212A Laceration without foreign body of right middle finger without damage to nail, initial encounter: Secondary | ICD-10-CM

## 2020-12-28 DIAGNOSIS — I10 Essential (primary) hypertension: Secondary | ICD-10-CM

## 2020-12-28 MED ORDER — TRIPLE ANTIBIOTIC 5-400-5000 EX OINT: TOPICAL_OINTMENT | Freq: Two times a day (BID) | CUTANEOUS | 0 refills | Status: AC

## 2020-12-28 NOTE — Discharge Instructions (Addendum)
-  There is not glass in your hand. -I sent a prescription for antibiotic ointment.  Please wash laceration with gentle soap and water, and then apply this 1-2 times daily to the laceration.  Apply bandage over this. -Continue taking your blood pressure medications as directed.  If you experience chest pain, vision changes, headaches, shortness of breath-had straight to the ER. -Come back and see [place] if your laceration is getting worse instead of better, new fevers/chills, discharge from the wound, etc.

## 2020-12-28 NOTE — ED Triage Notes (Signed)
Patient fell on her steps at home during ice and snow weather.  Patient was carrying glass jugs when she fell.  Patient thinks she has glass in right ring  and middle finger with ring finger being the worse

## 2020-12-28 NOTE — ED Notes (Signed)
Patient stopped at bathroom on her way to treatment room.

## 2020-12-28 NOTE — ED Provider Notes (Signed)
[facility]    CSN: [id number] Arrival date & time: 12/28/20  1717      History   Chief Complaint Chief Complaint  Patient presents with  . Finger Injury    HPI Colleen Lowery is a 80 y.o. female presenting with concern for glass in her right hand.  History allergies, arthritis, cancer, cataracts, Crohn's disease, depression, diabetes, GERD, headache, heart failure, colon polyps, hypertension, hypothyroid, IBS, seizures, thyroid disease, PAD. Currently receiving radiation therapy for breast cancer.  -2 weeks ago, Patient fell on her steps at home during ice and snow weather.  Patient was carrying glass jugs when she fell.  Patient thinks she has glass in right ring  and middle finger with ring finger being the worse.  She thinks that there is glass still in her hand as the injuries have been slow to heal.  States minimal pain.  States full range of motion.  Denies sensation changes.  Denies injury elsewhere, pain elsewhere, head trauma, loss of consciousness, recent seizures. -States she took her antihypertensives this morning.  Denies chest pain, vision changes, headaches.  HPI  Past Medical History:  Diagnosis Date  . Allergy   . Arthritis    FINGERS  . Cancer [facility])    left breast  . Cataract    right  . Crohn disease ([place])   . Depression   . Diabetes mellitus without complication ([place])    TYPE 2 , TAKING OTC MED FORGYMEMA  . GERD (gastroesophageal reflux disease)   . Headache    sinus  . Heart failure, acute, systolic and diastolic ([place]) MARCH 7564 DX  . History of colonic polyps   . Hypertension   . Hypothyroidism    per patient, takes OTC meds when temperature drops low  . IBS (irritable bowel syndrome)   . 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 Thyroid disease    AGE 55'S UNTIL  2016 OFF ALL THRYOID MEDS NOW   . Wears dentures   . Wears glasses     Patient Active Problem List   Diagnosis Date Noted  . Breast cancer,  left ([place]) 09/14/2020  . Port-A-Cath in place 05/31/2020  . Malignant neoplasm of upper-outer quadrant of left breast in female, estrogen receptor positive ([place]) 04/07/2020  . Peripheral arterial disease ([place]) 06/14/2016  . Upper airway cough syndrome 06/08/2016  . CAD in native artery 05/31/2016  . 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 VT (ventricular tachycardia) ([place]) 02/09/2016  . Acute on chronic combined systolic and diastolic HF (heart failure) ([place]) 02/09/2016  . Contrast media allergy 02/09/2016  . Dyspnea 02/07/2016  . Benign essential HTN 02/07/2016  . Cigarette smoker 02/07/2016  . Diabetes ([place]) 08/02/2014  . Essential hypertension 08/02/2014    Past Surgical History:  Procedure Laterality Date  . ABDOMINAL HYSTERECTOMY     PARTIAL  . BREAST SURGERY    . CARDIAC CATHETERIZATION  02/10/2016   Procedure: Right/Left Heart Cath and Coronary Angiography;  Surgeon: Leonie Man, MD;  Location: [facility];  Service: Cardiovascular;;  . CATARACT EXTRACTION W/ INTRAOCULAR LENS IMPLANT     left  . COLONOSCOPY WITH PROPOFOL N/A 02/16/2017   Procedure: COLONOSCOPY WITH PROPOFOL;  Surgeon: Carol Ada, MD;  Location: [facility];  Service: Endoscopy;  Laterality: N/A;  . EP IMPLANTABLE DEVICE N/A 02/10/2016   Procedure: ICD Implant;  Surgeon: Will Meredith Leeds, MD;  Location: [facility]  INVASIVE CV LAB;  Service: Cardiovascular;  Laterality: N/A;  . ESOPHAGOGASTRODUODENOSCOPY (EGD) WITH PROPOFOL N/A 02/16/2017   Procedure: ESOPHAGOGASTRODUODENOSCOPY (EGD) WITH PROPOFOL;  Surgeon: Carol Ada, MD;  Location: [facility];  Service: Endoscopy;  Laterality: N/A;  reflux  . IR IMAGING GUIDED PORT INSERTION  04/27/2020  . MASTECTOMY MODIFIED RADICAL Left 09/14/2020   Procedure: LEFT MASTECTOMY MODIFIED RADICAL;  Surgeon: Erroll Luna, MD;  Location: [facility];  Service: General;  Laterality: Left;  PEC BLOCK  . TONSILLECTOMY      OB  History   No obstetric history on file.      Home Medications    Prior to Admission medications   Medication Sig Start Date End Date Taking? Authorizing Provider  furosemide (LASIX) 20 MG tablet Take 20 mg by mouth daily.   Yes [provider]  losartan (COZAAR) 50 MG tablet Take 50 mg by mouth daily. 12/16/19  Yes [provider]  potassium chloride SA (K-DUR,KLOR-CON) 20 MEQ tablet Take 20 mEq by mouth 2 (two) times daily.   Yes [provider]  sotalol (BETAPACE) 80 MG tablet Take 1 tablet (80 mg total) by mouth 2 (two) times daily. 02/25/16  Yes Baldwin [place], PA-C  anastrozole (ARIMIDEX) 1 MG tablet Take 1 tablet (1 mg total) by mouth daily. 01/07/21   Nicholas Lose, MD  Ascorbic Acid (VITAMIN C PO) Take 1,000 mg by mouth 3 (three) times a week.     [provider]  b complex vitamins tablet Take 1 tablet by mouth 3 (three) times a week.     [provider]  Beta Carotene (VITAMIN A) 25000 UNIT capsule Take 25,000 Units by mouth daily.     [provider]  CALCIUM PO Take 1 tablet by mouth daily.     [provider]  Cholecalciferol (VITAMIN D3) 5000 units CAPS Take 5,000 Units by mouth daily.     [provider]  lidocaine-prilocaine (EMLA) cream Apply 1 application topically daily as needed (access port). Patient not taking: Reported on 10/14/2020 06/21/20   Nicholas Lose, MD  Liver Extract (LIVER PO) Take 1 tablet by mouth 3 (three) times a week.     [provider]  MAGNESIUM PO Take 1 tablet by mouth daily.     [provider]  methocarbamol (ROBAXIN) 500 MG tablet Take 1 tablet (500 mg total) by mouth every 6 (six) hours as needed for muscle spasms. Patient not taking: Reported on 10/14/2020 09/15/20   Erroll Luna, MD  Misc Natural Products (DUODENUM) TABS Take 1-5 tablets by mouth daily as needed (stomach swelling).    [provider]  nitroGLYCERIN (NITROSTAT) 0.4 MG SL tablet  Place 1 tablet (0.4 mg total) under the tongue every 5 (five) minutes x 3 doses as needed for chest pain. Patient not taking: Reported on 10/14/2020 02/12/16   Brett Canales, PA-C  ondansetron (ZOFRAN) 8 MG tablet Take 1 tablet (8 mg total) by mouth 2 (two) times daily as needed for refractory nausea / vomiting. Start on day 3 after chemotherapy. Patient not taking: Reported on 10/14/2020 04/19/20   Nicholas Lose, MD  OVER THE COUNTER MEDICATION Take 1-5 tablets by mouth daily as needed (itching). Vitamin b5     [provider]  OVER THE COUNTER MEDICATION Take 5-10 tablets by mouth as needed (mold induced itching). SOD superoxide dismutase  Patient not taking: Reported on 10/14/2020    [provider]  OVER THE COUNTER MEDICATION Take 10 tablets by  mouth daily as needed (constipation). Mega-zyme digestive enzyme    [provider]  PANTETHINE PO Take 1-2 tablets by mouth daily.    [provider]  prochlorperazine (COMPAZINE) 10 MG tablet Take 1 tablet (10 mg total) by mouth every 6 (six) hours as needed (Nausea or vomiting). Patient not taking: Reported on 10/14/2020 04/19/20   Nicholas Lose, MD  traMADol (ULTRAM) 50 MG tablet Take 1 tablet (50 mg total) by mouth every 6 (six) hours as needed (mild pain). Patient not taking: Reported on 10/14/2020 09/15/20   Erroll Luna, MD  vitamin E 400 UNIT capsule Take 400 Units by mouth 3 (three) times a week.  Patient not taking: Reported on 10/14/2020    [provider]    Family History Family History  Problem Relation Age of Onset  . Diabetes Mother   . Stroke Mother   . Diabetes Brother   . Diabetes Maternal Grandmother   . Diabetes Paternal Grandfather   . Heart failure Paternal Grandfather   . Hypertension Brother   . Heart attack Neg Hx     Social History Social History   Tobacco Use  . Smoking status: Current Every Day Smoker    Packs/day: 0.25    Years: 50.00    Pack years: 12.50    Types:  Cigarettes  . Smokeless tobacco: Never Used  . Tobacco comment: 3 puffs of each cigarette  Vaping Use  . Vaping Use: Never used  Substance Use Topics  . Alcohol use: No  . Drug use: No     Allergies   Contrast media [iodinated diagnostic agents], Purell instant hand [alcohol], Atorvastatin, Dust mite extract, Latex, and Molds & smuts   Review of Systems Review of Systems  Skin: Positive for wound.  All other systems reviewed and are negative.    Physical Exam Triage Vital Signs ED Triage Vitals  Enc Vitals Group     BP 12/28/20 1745 (!) 219/217     Pulse Rate 12/28/20 1745 78     Resp 12/28/20 1745 20     Temp --      Temp src --      SpO2 12/28/20 1745 100 %     Weight --      Height --      Head Circumference --      Peak Flow --      Pain Score 12/28/20 1741 1     Pain Loc --      Pain Edu? --      Excl. in [place]? --    No data found.  Updated Vital Signs BP (!) 205/98 (BP Location: Right Arm)   Pulse 78   Resp 20   SpO2 100%   Visual Acuity Right Eye Distance:   Left Eye Distance:   Bilateral Distance:    Right Eye Near:   Left Eye Near:    Bilateral Near:     Physical Exam Constitutional:      Appearance: Normal appearance.  HENT:     Head: Normocephalic and atraumatic.  Cardiovascular:     Rate and Rhythm: Normal rate and regular rhythm.     Heart sounds: Normal heart sounds.  Pulmonary:     Effort: Pulmonary effort is normal.     Breath sounds: Normal breath sounds.  Skin:    General: Skin is warm.     Capillary Refill: Capillary refill takes less than 2 seconds.     Comments: R middle finger with 1.5cm  laceration to dorsal aspect. Appears to be healing well, no longer actively bleeding. Sensation intact, neurovascularly intact, ROM intact, cap refill <3 seconds.   Right ring finger with faint well-healed laceration to distal tip .  Sensation intact, neurovascularly intact, ROM intact, cap refill <3 seconds.   Neurological:     General:  No focal deficit present.     Mental Status: She is alert and oriented to person, place, and time.     Comments: CN 2-12 grossly intact.  Psychiatric:        Mood and Affect: Mood normal.        Behavior: Behavior normal.        Thought Content: Thought content is paranoid.        Judgment: Judgment normal.      UC Treatments / Results  Labs (all labs ordered are listed, but only abnormal results are displayed) Labs Reviewed - No data to display  EKG   Radiology No results found.  Procedures Procedures (including critical care time)  Medications Ordered in UC Medications - No data to display  Initial Impression / Assessment and Plan / UC Course  I have reviewed the triage vital signs and the nursing notes.  Pertinent labs & imaging results that were available during my care of the patient were reviewed by me and considered in my medical decision making (see chart for details).     This patient is a 80 year old female presenting 2 weeks following fall at home.  She is concerned that she has glass stuck in her fingers on right hand.  Xray R hand: 1. No fracture or radiopaque foreign body.  Blood pressure initially 205/98, 190/100 on repeat, denies headaches, vision changes, chest pain, left arm pain.  States she is taking her antihypertensives as directed.  Please monitor blood pressure at home or pharmacy.  If continues to be over 140/90 at home, follow-up with primary care.  Strict return precautions as well.  Throughout our visit this patient expressed multiple unusual/paranoid ideations.  I do not see psych history in her chart.  Denies SI/HI.  If delusions continue, rec follow-up with PCP  Return precautions- chest pain, shortness of breath, worst headache of life, vision changes, new/worsening fevers/chills, confusion, worsening of symptoms despite the above treatment plan, etc.   Spent over 40 minutes obtaining H&P, performing physical, interpreting films,  discussing results, providing wound care, treatment plan and plan for follow-up with patient. Patient agrees with plan.   This chart was dictated using voice recognition software, Dragon. Despite the best efforts of this provider to proofread and correct errors, errors may still occur which can change documentation meaning.   Final Clinical Impressions(s) / UC Diagnoses   Final diagnoses:  Laceration of right middle finger without foreign body without damage to nail, initial encounter  Essential hypertension     Discharge Instructions      -Continue taking your blood pressure medications as directed.  If you experience chest pain, vision changes, headaches, shortness of breath-had straight to the ER.   ED Prescriptions    None     PDMP not reviewed this encounter.   Hazel Sams, PA-C 12/28/20 [facility]

## 2021-02-07 ENCOUNTER — Telehealth: Payer: Self-pay

## 2021-02-07 NOTE — Telephone Encounter (Signed)
She left a message to call her.  Called back and reviewed appts. She is aware of appt date/time.

## 2021-02-09 DIAGNOSIS — K219 Gastro-esophageal reflux disease without esophagitis: Secondary | ICD-10-CM | POA: Insufficient documentation

## 2021-02-09 DIAGNOSIS — K2900 Acute gastritis without bleeding: Secondary | ICD-10-CM | POA: Insufficient documentation

## 2021-03-07 ENCOUNTER — Ambulatory Visit
Admission: RE | Admit: 2021-03-07 | Discharge: 2021-03-07 | Disposition: A | Discharge status: Home / Self Care | Payer: Medicare HMO | Source: Ambulatory Visit | Attending: Radiation Oncology | Admitting: Radiation Oncology

## 2021-03-07 ENCOUNTER — Other Ambulatory Visit: Payer: Self-pay

## 2021-03-07 DIAGNOSIS — C50412 Malignant neoplasm of upper-outer quadrant of left female breast: Secondary | ICD-10-CM

## 2021-03-07 DIAGNOSIS — Z170 Estrogen receptor positive status [ER+]: Secondary | ICD-10-CM | POA: Insufficient documentation

## 2021-03-07 NOTE — Progress Notes (Signed)
  Radiation Oncology         [phone] ________________________________  Name: Colleen Lowery MRN: [id number]  Date of Service: 03/07/2021  DOB: 06/12/1941  Post Treatment Telephone Note  Diagnosis:   Stage IIIB, [id number], grade 2, ER/PR positive invasive ductal carcinoma of the left breast.  Interval Since Last Radiation: 12 weeks   10/28/2020 through 12/17/2020 Site Technique Total Dose (Gy) Dose per Fx (Gy) Completed Fx Beam Energies  Chest Wall, Left: CW_Lt 3D 50.4/50.4 1.8 28/28 6X, 10X  Chest Wall, Left: CW_Lt_SCLV 3D 50.4/50.4 1.8 28/28 6X, 10X  Chest Wall, Left: CW_Lt_Bst Electron 10/10 2 5/5 9E     Narrative:  The patient was contacted today for routine follow-up. During treatment she did very well with radiotherapy and did not have significant desquamation. She reports she is doing well and her skin is healed. She has had some persistent hyperpigmentation. She also had her ICD reimplanted a few weeks ago.   Impression/Plan: 1. Stage IIIB, cT4N1M0, grade 2, ER/PR positive invasive ductal carcinoma of the left breast. The patient has been doing well since completion of radiotherapy. We discussed that we would be happy to continue to follow her as needed, but she will also continue to follow up with Dr. Lindi Adie in medical oncology. She was counseled on skin care as well as measures to avoid sun exposure to this area.  2. Survivorship. We discussed the importance of survivorship evaluation and encouraged her to attend her upcoming visit with that clinic.      Carola Rhine, PAC

## 2021-03-10 ENCOUNTER — Other Ambulatory Visit: Payer: Self-pay | Admitting: Adult Health

## 2021-03-10 ENCOUNTER — Inpatient Hospital Stay: Payer: Medicare HMO | Attending: Adult Health | Admitting: Adult Health

## 2021-03-10 ENCOUNTER — Other Ambulatory Visit: Payer: Self-pay

## 2021-03-10 ENCOUNTER — Encounter: Payer: Self-pay | Admitting: Adult Health

## 2021-03-10 VITALS — BP 169/90 | HR 100 | Temp 97.3°F | Resp 20 | Ht 62.0 in | Wt 138.2 lb

## 2021-03-10 DIAGNOSIS — Z923 Personal history of irradiation: Secondary | ICD-10-CM | POA: Insufficient documentation

## 2021-03-10 DIAGNOSIS — Z79811 Long term (current) use of aromatase inhibitors: Secondary | ICD-10-CM | POA: Diagnosis not present

## 2021-03-10 DIAGNOSIS — C50412 Malignant neoplasm of upper-outer quadrant of left female breast: Secondary | ICD-10-CM

## 2021-03-10 DIAGNOSIS — F17210 Nicotine dependence, cigarettes, uncomplicated: Secondary | ICD-10-CM | POA: Insufficient documentation

## 2021-03-10 DIAGNOSIS — Z170 Estrogen receptor positive status [ER+]: Secondary | ICD-10-CM

## 2021-03-10 DIAGNOSIS — Z9012 Acquired absence of left breast and nipple: Secondary | ICD-10-CM | POA: Diagnosis not present

## 2021-03-10 DIAGNOSIS — C50812 Malignant neoplasm of overlapping sites of left female breast: Secondary | ICD-10-CM | POA: Insufficient documentation

## 2021-03-10 MED ORDER — PREDNISONE 50 MG PO TABS: ORAL_TABLET | ORAL | 0 refills | Status: DC

## 2021-03-10 MED ORDER — ALENDRONATE SODIUM 70 MG PO TABS
70.0000 mg | ORAL_TABLET | ORAL | 0 refills | Status: DC
Start: 2021-03-10 — End: 2021-03-10

## 2021-03-10 NOTE — Patient Instructions (Signed)
Alendronate Weekly Oral Tablets What is this medicine? ALENDRONATE (a LEN droe nate) slows calcium loss from bones. It treats osteoporosis. It may be used in other people at risk for bone loss. This medicine may be used for other purposes; ask your health care provider or pharmacist if you have questions. COMMON BRAND NAME(S): Fosamax What should I tell my health care provider before I take this medicine? They need to know if you have any of these conditions:  bleeding disorder  cancer  dental disease  difficulty swallowing  infection (fever, chills, cough, sore throat, pain or trouble passing urine)  kidney disease  low levels of calcium or other minerals in the blood  low red blood cell counts  receiving steroids like dexamethasone or prednisone  stomach or intestine problems  trouble sitting or standing for 30 minutes  an unusual or allergic reaction to alendronate, other drugs, foods, dyes or preservatives  pregnant or trying to get pregnant  breast-feeding How should I use this medicine? Take this drug by mouth with a full glass of water. Take it as directed on the prescription label at the same day of each week. Take the dose right after waking up. Do not eat or drink anything before taking it. Do not take it with any other drink except water. Do not chew or crush the tablet. After taking it, do not eat breakfast, drink, or take any other drugs or vitamins for at least 30 minutes. Sit or stand up for at least 30 minutes after you take it. Do not lie down. Keep taking it unless your health care provider tells you to stop. A special MedGuide will be given to you by the pharmacist with each prescription and refill. Be sure to read this information carefully each time. Talk to your health care provider about the use of this drug in children. Special care may be needed. Overdosage: If you think you have taken too much of this medicine contact a poison control center or  emergency room at once. NOTE: This medicine is only for you. Do not share this medicine with others. What if I miss a dose? If you take your drug once a day, skip it. Take your next dose at the scheduled time the next morning. Do not take two doses on the same day. If you take your drug once a week, take the missed dose on the morning after you remember. Do not take two doses on the same day. What may interact with this medicine?  aluminum hydroxide  antacids  aspirin  calcium supplements  drugs for inflammation like ibuprofen, naproxen, and others  iron supplements  magnesium supplements  vitamins with minerals This list may not describe all possible interactions. Give your health care provider a list of all the medicines, herbs, non-prescription drugs, or dietary supplements you use. Also tell them if you smoke, drink alcohol, or use illegal drugs. Some items may interact with your medicine. What should I watch for while using this medicine? Visit your health care provider for regular checks on your progress. It may be some time before you see the benefit from this drug. Some people who take this drug have severe bone, joint, or muscle pain. This drug may also increase your risk for jaw problems or a broken thigh bone. Tell your health care provider right away if you have severe pain in your jaw, bones, joints, or muscles. Tell you health care provider if you have any pain that does not go away  or that gets worse. Tell your dentist and dental surgeon that you are taking this drug. You should not have major dental surgery while on this drug. See your dentist to have a dental exam and fix any dental problems before starting this drug. Take good care of your teeth while on this drug. Make sure you see your dentist for regular follow-up appointments. You should make sure you get enough calcium and vitamin D while you are taking this drug. Discuss the foods you eat and the vitamins you take  with your health care provider. You may need blood work done while you are taking this drug. What side effects may I notice from receiving this medicine? Side effects that you should report to your doctor or health care provider as soon as possible:  allergic reactions (skin rash, itching or hives; swelling of the face, lips, or tongue)  bone pain  heartburn (burning feeling in chest, often after eating or when lying down)  jaw pain, especially after dental work  joint pain  low calcium levels (fast heartbeat; muscle cramps or pain; pain, tingling, or numbness in the hands or feet; seizures)  muscle pain  painful or difficulty swallowing  redness, blistering, peeling, or loosening of the skin, including inside the mouth  stomach bleeding (bloody or black, tarry stools; spitting up blood or brown material that looks like coffee grounds) Side effects that usually do not require medical attention (report to your doctor or health care provider if they continue or are bothersome):  changes in taste  constipation  diarrhea  nausea This list may not describe all possible side effects. Call your doctor for medical advice about side effects. You may report side effects to FDA at 1-800-FDA-1088. Where should I keep my medicine? Keep out of the reach of children and pets. Store at room temperature between 15 and 30 degrees C (59 and 86 degrees F). Throw away any unused drug after the expiration date. NOTE: This sheet is a summary. It may not cover all possible information. If you have questions about this medicine, talk to your doctor, pharmacist, or health care provider.  2021 [employer] Standard (2019-09-18 10:39:05) Ibandronate Monthly Oral Tablets What is this medicine? IBANDRONATE (i BAN droh nate) slows calcium loss from bones. It treats osteoporosis. It may be used in other people at risk for bone loss. This medicine may be used for other purposes; ask your health care  provider or pharmacist if you have questions. COMMON BRAND NAME(S): Boniva What should I tell my health care provider before I take this medicine? They need to know if you have any of these conditions:  bleeding disorder  cancer  infection (fever, chills, cough, sore throat, pain or trouble passing urine)  dental disease  difficulty swallowing  kidney disease  low levels of calcium or other minerals in the blood  low red blood cell counts  receiving steroids like dexamethasone or prednisone  stomach or intestine problems  an unusual or allergic reaction to ibandronate, other drugs, foods, dyes or preservatives  pregnant or trying to get pregnant  breast-feeding How should I use this medicine? Take this drug by mouth with a full glass of water. Take it as directed on the prescription label on the same day of each month.Take the dose right after waking up. Do not eat or drink anything before taking it. Do not take it with any other drink except water. Do not chew or crush the tablet. After taking it, do not eat  breakfast, drink, or take any other drugs or vitamins for at least 30 minutes. Sit or stand up for at least 60 minutes after you take it. Do not lie down. Keep taking it unless your health care provider tells you to stop. A special MedGuide will be given to you by the pharmacist with each prescription and refill. Be sure to read this information carefully each time. Talk to your health care provider about the use of this drug in children. Special care may be needed. Overdosage: If you think you have taken too much of this medicine contact a poison control center or emergency room at once. NOTE: This medicine is only for you. Do not share this medicine with others. What if I miss a dose? If you miss a dose and the next scheduled dose is more than 7 days away, take the missed dose on the morning after you remember. Do not take two doses on the same day. If you miss a dose and  the next scheduled dose is only 1 to 7 days away, skip it. Take the next dose on the morning of the next scheduled dose. Do not take two doses on the same day. What may interact with this medicine?  aluminum hydroxide  antacids  aspirin  calcium supplements  drugs for inflammation like ibuprofen, naproxen, and others  iron supplements  magnesium supplements  vitamins with minerals This list may not describe all possible interactions. Give your health care provider a list of all the medicines, herbs, non-prescription drugs, or dietary supplements you use. Also tell them if you smoke, drink alcohol, or use illegal drugs. Some items may interact with your medicine. What should I watch for while using this medicine? Visit your health care provider for regular checks on your progress. It may be some time before you see the benefit from this drug. Some people who take this drug have severe bone, joint, or muscle pain. This drug may also increase your risk for jaw problems or a broken thigh bone. Tell your health care provider right away if you have severe pain in your jaw, bones, joints, or muscles. Tell you health care provider if you have any pain that does not go away or that gets worse. Tell your dentist and dental surgeon that you are taking this drug. You should not have major dental surgery while on this drug. See your dentist to have a dental exam and fix any dental problems before starting this drug. Take good care of your teeth while on this drug. Make sure you see your dentist for regular follow-up appointments. You should make sure you get enough calcium and vitamin D while you are taking this drug. Discuss the foods you eat and the vitamins you take with your health care provider. You may need blood work done while you are taking this drug. What side effects may I notice from receiving this medicine? Side effects that you should report to your doctor or health care provider as soon  as possible:  allergic reactions (skin rash, itching or hives; swelling of the face, lips, or tongue)  bone pain  eye irritation, itching  heartburn (burning feeling in chest, often after eating or when lying down)  infection (fever, chills, cough, sore throat, pain or trouble passing urine)  jaw pain, especially after dental work  joint pain  low calcium levels (fast heartbeat; muscle cramps or pain; pain, tingling, or numbness in the hands or feet; seizures)  muscle pain  painful or  difficulty swallowing Side effects that usually do not require medical attention (report to your doctor or health care provider if they continue or are bothersome):  back pain  chest pain  diarrhea  dizziness  general ill feeling or flu-like symptoms  headache  nausea  stomach pain This list may not describe all possible side effects. Call your doctor for medical advice about side effects. You may report side effects to FDA at 1-800-FDA-1088. Where should I keep my medicine? Keep out of the reach of children and pets. Store at room temperature between 15 and 30 degrees C (59 and 86 degrees F). Throw away any unused drug after the expiration date. NOTE: This sheet is a summary. It may not cover all possible information. If you have questions about this medicine, talk to your doctor, pharmacist, or health care provider.  2021 [employer] Standard (2019-08-08 07:03:39)

## 2021-03-10 NOTE — Progress Notes (Signed)
SURVIVORSHIP VISIT:   BRIEF ONCOLOGIC HISTORY:  Oncology History  Malignant neoplasm of upper-outer quadrant of left breast in female, estrogen receptor positive ([place])  03/22/2020 Initial Diagnosis   Diffuse left breast swelling. skin thickening and two areas of irregular hypoechogenicity in the 2-3 o'clock region, 4.9cm, and at least 4 abnormal lymph nodes  with cortical thickening. 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 Administration: 0.25 mg (04/19/2020), 0.25 mg (05/10/2020), 0.25 mg (05/31/2020), 0.25 mg (06/21/2020), 0.25 mg (07/12/2020), 0.25 mg (08/02/2020)  methotrexate (PF) chemo injection 50 mg, 29.8 mg/m2 = 50.5 mg (100 % of original dose 30 mg/m2), Intravenous,  Once, 6 of 6 cycles. Dose modification: 30 mg/m2 (original dose 30 mg/m2, Cycle 1, Reason: Provider Judgment), 25 mg/m2 (original dose 30 mg/m2, Cycle 3, Reason: Provider Judgment). Administration: 50 mg (04/19/2020), 50 mg (05/10/2020), 42 mg (05/31/2020), 42 mg (06/21/2020), 42 mg (07/12/2020), 42 mg (08/02/2020)  cyclophosphamide (CYTOXAN) 840 mg in sodium chloride 0.9 % 250 mL chemo infusion, 500 mg/m2 = 840 mg (100 % of original dose 500 mg/m2), Intravenous,  Once, 6 of 6 cycles Dose modification: 500 mg/m2 (original dose 500 mg/m2, Cycle 1, Reason: Provider Judgment), 400 mg/m2 (original dose 500 mg/m2, Cycle 3, Reason: Provider Judgment). Administration: 840 mg (04/19/2020), 840 mg (05/10/2020), 680 mg (05/31/2020), 680 mg (06/21/2020), 680 mg (07/12/2020), 680 mg (08/02/2020)  fluorouracil (ADRUCIL) chemo injection 850 mg, 500 mg/m2 = 850 mg (100 % of original dose 500 mg/m2), Intravenous,   Once, 6 of 6 cycles. Dose modification: 500 mg/m2 (original dose 500 mg/m2, Cycle 1, Reason: Provider Judgment). Administration: 850 mg (04/19/2020), 850 mg (05/10/2020), 850 mg (05/31/2020), 850 mg (06/21/2020), 850 mg (07/12/2020), 850 mg (08/02/2020)   09/14/2020 Surgery   Left mastectomy ([name]) ([id number]): invasive and in situ ductal carcinoma, grade 2, 15cm, clear margins, with 14/14 lymph nodes positive for metastatic carcinoma with extracapsular extension.    10/28/2020 - 12/17/2020 Radiation Therapy   The patient initially received a dose of 50.4 Gy in 28 fractions to the left breast and supraclavicular region using whole-breast tangent fields. This was delivered using a 3-D conformal technique. The pt received a boost delivering an additional 10 Gy in 5 fractions using a electron boost with 38mV electrons. The total dose was 60.4 Gy.   11/2020 - 11/2027 Anti-estrogen oral therapy   Anastrozole     INTERVAL HISTORY:  Colleen Lowery review her survivorship care plan detailing her treatment course for breast cancer, as well as monitoring long-term side effects of that treatment, education regarding health maintenance, screening, and overall wellness and health promotion.     Overall, Colleen Lowery reports feeling quite well.  She is stressed today because her brother who she was close with recently passed.  She has some memory changes, and these she notes are not suddenly new, and she believes are related to age.    She is taking Anastrozole daily and is tolerating it well.  She did change it from morning to evening after a few weeks because it was making her sleepy.    REVIEW OF SYSTEMS:  Review of Systems  Constitutional:  Negative for appetite change, chills, fatigue, fever and unexpected weight change.  HENT:   Negative for hearing loss, lump/mass and trouble swallowing.   Eyes: Negative for eye problems and icterus.  Respiratory: Negative for chest tightness, cough and shortness of breath.    Cardiovascular: Negative for chest pain, leg swelling and palpitations.  Gastrointestinal: Negative for abdominal distention, abdominal pain, constipation, diarrhea, nausea and vomiting.  Endocrine: Negative for hot flashes.  Genitourinary: Negative for difficulty urinating.   Musculoskeletal: Negative for arthralgias.  Skin: Negative for itching and rash.  Neurological: Negative for dizziness, extremity weakness, headaches and numbness.  Hematological: Negative for adenopathy. Does not bruise/bleed easily.  Psychiatric/Behavioral: Negative for depression. The patient is not nervous/anxious.    Breast: Denies any new nodularity, masses, tenderness, nipple changes, or nipple discharge.      ONCOLOGY TREATMENT TEAM:  1. Surgeon:  Dr. Brantley Stage at [facility] 2. Medical Oncologist: Dr. Lindi Adie  3. Radiation Oncologist: Dr. Lisbeth Renshaw    PAST MEDICAL/SURGICAL HISTORY:  Past Medical History:  Diagnosis Date  . Allergy   . Arthritis    FINGERS  . Cancer [facility])    left breast  . Cataract    right  . Crohn disease ([place])   . Depression   . Diabetes mellitus without complication ([place])    TYPE 2 , TAKING OTC MED FORGYMEMA  . GERD (gastroesophageal reflux disease)   . Headache    sinus  . Heart failure, acute, systolic and diastolic ([place]) MARCH 8119 DX  . History of colonic polyps   . Hypertension   . Hypothyroidism    per patient, takes OTC meds when temperature drops low  . IBS (irritable bowel syndrome)   . 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 Thyroid disease    AGE 24'S UNTIL  2016 OFF ALL THRYOID MEDS NOW   . Wears dentures   . Wears glasses    Past Surgical History:  Procedure Laterality Date  . ABDOMINAL HYSTERECTOMY     PARTIAL  . BREAST SURGERY    . CARDIAC CATHETERIZATION  02/10/2016   Procedure: Right/Left Heart Cath and Coronary Angiography;  Surgeon: Leonie Man, MD;  Location: [place] [facility];  Service:  Cardiovascular;;  . CATARACT EXTRACTION W/ INTRAOCULAR LENS IMPLANT     left  . COLONOSCOPY WITH PROPOFOL N/A 02/16/2017   Procedure: COLONOSCOPY WITH PROPOFOL;  Surgeon: Carol Ada, MD;  Location: [facility];  Service: Endoscopy;  Laterality: N/A;  . EP IMPLANTABLE DEVICE N/A 02/10/2016   Procedure: ICD Implant;  Surgeon: Will Meredith Leeds, MD;  Location: [place] [facility];  Service: Cardiovascular;  Laterality: N/A;  . ESOPHAGOGASTRODUODENOSCOPY (EGD) WITH PROPOFOL N/A 02/16/2017   Procedure: ESOPHAGOGASTRODUODENOSCOPY (EGD) WITH PROPOFOL;  Surgeon: Carol Ada, MD;  Location: [facility];  Service: Endoscopy;  Laterality: N/A;  reflux  . IR IMAGING GUIDED PORT INSERTION  04/27/2020  . MASTECTOMY MODIFIED RADICAL Left 09/14/2020   Procedure: LEFT MASTECTOMY MODIFIED RADICAL;  Surgeon: Erroll Luna, MD;  Location: [place];  Service: General;  Laterality: Left;  PEC BLOCK  . TONSILLECTOMY       ALLERGIES:  Allergies  Allergen Reactions  . Contrast Media [Iodinated Diagnostic Agents] Anaphylaxis    Throat closed, was 40 yrs ago  . Purell Instant Hand [Alcohol] Swelling    Swelling in the luNGS, CHEST AND THROAT and seizures  . Atorvastatin     Myalgia   . Dust Mite Extract Swelling  SWELLING IS GI SYSTEM  . Latex     QUESTIONABLE INTOLERABLE MUCOUS OOZING OUT OF THROAT  . Molds & Smuts Swelling    SWELLING IS IN GI SYSTEM     CURRENT MEDICATIONS:  Outpatient Encounter Medications as of 03/10/2021  Medication Sig  . anastrozole (ARIMIDEX) 1 MG tablet Take 1 tablet (1 mg total) by mouth daily.  . Ascorbic Acid (VITAMIN C PO) Take 1,000 mg by mouth 3 (three) times a week.   [name] b complex vitamins tablet Take 1 tablet by mouth 3 (three) times a week.   . Beta Carotene (VITAMIN A) 25000 UNIT capsule Take 25,000 Units by mouth daily.   [name] CALCIUM PO Take 1 tablet by mouth daily.  . Cholecalciferol (VITAMIN D3) 5000 units CAPS Take 5,000 Units by mouth daily.  . furosemide (LASIX)  20 MG tablet Take 20 mg by mouth daily.  [name] lidocaine-prilocaine (EMLA) cream Apply 1 application topically daily as needed (access port).  . Liver Extract (LIVER PO) Take 1 tablet by mouth 3 (three) times a week.  . losartan (COZAAR) 50 MG tablet Take 50 mg by mouth daily.  [name] MAGNESIUM PO Take 1 tablet by mouth daily.   . methocarbamol (ROBAXIN) 500 MG tablet Take 1 tablet (500 mg total) by mouth every 6 (six) hours as needed for muscle spasms.  . Misc Natural Products (DUODENUM) TABS Take 1-5 tablets by mouth daily as needed (stomach swelling).  [name] OVER THE COUNTER MEDICATION Take 1-5 tablets by mouth daily as needed (itching). Vitamin b5   . OVER THE COUNTER MEDICATION Take 5-10 tablets by mouth as needed (mold induced itching). SOD superoxide dismutase  . OVER THE COUNTER MEDICATION Take 10 tablets by mouth daily as needed (constipation). Mega-zyme digestive enzyme  . PANTETHINE PO Take 1-2 tablets by mouth daily.  . potassium chloride SA (K-DUR,KLOR-CON) 20 MEQ tablet Take 20 mEq by mouth 2 (two) times daily.  . sotalol (BETAPACE) 80 MG tablet Take 1 tablet (80 mg total) by mouth 2 (two) times daily.  . vitamin E 400 UNIT capsule Take 400 Units by mouth 3 (three) times a week.  . [DISCONTINUED] nitroGLYCERIN (NITROSTAT) 0.4 MG SL tablet Place 1 tablet (0.4 mg total) under the tongue every 5 (five) minutes x 3 doses as needed for chest pain.  [name] ondansetron (ZOFRAN) 8 MG tablet Take 1 tablet (8 mg total) by mouth 2 (two) times daily as needed for refractory nausea / vomiting. Start on day 3 after chemotherapy. (Patient not taking: No sig reported)  . prochlorperazine (COMPAZINE) 10 MG tablet Take 1 tablet (10 mg total) by mouth every 6 (six) hours as needed (Nausea or vomiting). (Patient not taking: No sig reported)  . traMADol (ULTRAM) 50 MG tablet Take 1 tablet (50 mg total) by mouth every 6 (six) hours as needed (mild pain). (Patient not taking: No sig reported)   No facility-administered  encounter medications on file as of 03/10/2021.     ONCOLOGIC FAMILY HISTORY:  Family History  Problem Relation Age of Onset  . Diabetes Mother   . Stroke Mother   . Diabetes Brother   . Diabetes Maternal Grandmother   . Diabetes Paternal Grandfather   . Heart failure Paternal Grandfather   . Hypertension Brother   . Heart attack Neg Hx      GENETIC COUNSELING/TESTING: See above  SOCIAL HISTORY:  Social History   Socioeconomic History  . Marital status: Single    Spouse name: Not on file  .  Number of children: Not on file  . Years of education: Not on file  . Highest education level: Not on file  Occupational History  . Not on file  Tobacco Use  . Smoking status: Current Every Day Smoker    Packs/day: 0.25    Years: 50.00    Pack years: 12.50    Types: Cigarettes  . Smokeless tobacco: Never Used  . Tobacco comment: 3 puffs of each cigarette  Vaping Use  . Vaping Use: Never used  Substance and Sexual Activity  . Alcohol use: No  . Drug use: No  . Sexual activity: Not Currently  Other Topics Concern  . Not on file  Social History Narrative  . Not on file   Social Determinants of Health   Financial Resource Strain: Not on file  Food Insecurity: Not on file  Transportation Needs: Not on file  Physical Activity: Not on file  Stress: Not on file  Social Connections: Not on file  Intimate Partner Violence: Not on file     OBSERVATIONS/OBJECTIVE:  BP (!) 169/90 (BP Location: Right Arm, Patient Position: Sitting)   Pulse 100   Temp (!) 97.3 F (36.3 C) (Temporal)   Resp 20   Ht 5' 2"  (1.575 m)   Wt 138 lb 3.2 oz (62.7 kg)   SpO2 100%   BMI 25.28 kg/m  GENERAL: Patient is a well appearing female in no acute distress HEENT:  Sclerae anicteric. Mask in place Neck is supple.  NODES:  No cervical, supraclavicular, or axillary lymphadenopathy palpated.  BREAST EXAM:  Left breast s/p mastectomy and radiation, no sign of local recurrence, right breast  benign LUNGS:  Clear to auscultation bilaterally.  No wheezes or rhonchi. HEART:  Regular rate and rhythm. No murmur appreciated. ABDOMEN:  Soft, nontender.  Positive, normoactive bowel sounds. No organomegaly palpated. MSK:  No focal spinal tenderness to palpation. Full range of motion bilaterally in the upper extremities. EXTREMITIES:  No peripheral edema.   SKIN:  Clear with no obvious rashes or skin changes. No nail dyscrasia. NEURO:  Nonfocal. Well oriented.  Appropriate affect.    LABORATORY DATA:  None for this visit.  DIAGNOSTIC IMAGING:  None for this visit.      ASSESSMENT AND PLAN:  Ms.. Lowery is a pleasant 80 y.o. 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  She presents to the Survivorship Clinic for our initial meeting and routine follow-up post-completion of treatment for breast cancer.    1. Stage IIIB right/left breast cancer:  Colleen Lowery is continuing to recover from definitive treatment for breast cancer. She will follow-up with her medical oncologist, Dr. Lindi Adie in 6 months with history and physical exam per surveillance protocol.  She will continue her anti-estrogen therapy with Anastrozole. Thus far, she is tolerating the Anatrozole well, with minimal side effects. She was instructed to make Dr. Lindi Adie or myself aware if she begins to experience any worsening side effects of the medication and I could see her back in clinic to help manage those side effects, as needed. Her mammogram is due 03/2021; orders placed today. Today, a comprehensive survivorship care plan and treatment summary was reviewed with the patient today detailing her breast cancer diagnosis, treatment course, potential late/long-term effects of treatment, appropriate follow-up care with recommendations for the  future, and patient education resources.  A copy of this summary, along  with a letter will be sent to the patient's primary care provider via mail/fax/In Basket message after today's visit.    2.  Renal Cyst: Will order CT chest/abdomen/pelvis for re evaluation to be completed next month to f/u from findings from CT scan completed in 04/2020  3. Bone health:  Given Colleen Lowery's age/history of breast cancer and her current treatment regimen including anti-estrogen therapy with Anastrozole, she is at risk for bone demineralization.  Her last DEXA scan was 01/2021, which showed osteoporosis with a T score of -3.5.  I gave her information about oral bisphosphanates.  I reviewed my recommendation with Zometa every year which can also help prevent breast cancer recurrence in the bones, however she declined.  She did agree to try Fosamax weekly, and I reviewed that she should have regular dentist visits and drink a full glass of water and stay upright 30 minutes after taking.  She verbalized understanding.  She was given education on specific activities to promote bone health.  4. Cancer screening:  Due to Colleen Lowery's history and her age, she should receive screening for skin cancers, colon cancer, and gynecologic cancers.  The information and recommendations are listed on the patient's comprehensive care plan/treatment summary and were reviewed in detail with the patient.    5. Health maintenance and wellness promotion: Colleen Lowery was encouraged to consume 5-7 servings of fruits and vegetables per day. We reviewed the "Nutrition Rainbow" handout, as well as the handout "Take Control of Your Health and Reduce Your Cancer Risk" from the [facility].  She was also encouraged to engage in moderate to vigorous exercise for 30 minutes per day most days of the week. We discussed the [employer] fitness program, which is designed for cancer survivors to help them become more physically fit after cancer  treatments.  She was instructed to limit her alcohol consumption and continue to abstain from tobacco use.     6. Support services/counseling: It is not uncommon for this period of the patient's cancer care trajectory to be one of many emotions and stressors.  We discussed how this can be increasingly difficult during the times of quarantine and social distancing due to the COVID-19 pandemic.   She was given information regarding our available services and encouraged to contact me with any questions or for help enrolling in any of our support group/programs.    Follow up instructions:    -Return to cancer center 6 months for f/u with Dr. Lindi Adie  -Mammogram due in 03/2021 -Follow up with surgery 1 year -Repeat CT chest/abd/pelvis -She is welcome to return back to the Survivorship Clinic at any time; no additional follow-up needed at this time.  -Consider referral back to survivorship as a long-term survivor for continued surveillance  The patient was provided an opportunity to ask questions and all were answered. The patient agreed with the plan and demonstrated an understanding of the instructions.   Total encounter time: 44 minutes* spent preparing SCP, completing the visit as noted above, reviewing results, ordering medications, and communicating with oncologist, surgeon, and documentation.  Colleen Bihari, NP 03/10/21 1:18 PM Medical Oncology and Hematology [facility] [place], [place] [address] Tel. [phone]    Fax. [phone]  *Total Encounter Time as defined by the Centers for Medicare and Medicaid Services includes, in addition to the face-to-face time of a patient visit (documented in the note above) non-face-to-face time: obtaining and reviewing outside history, ordering and reviewing medications, tests  or procedures, care coordination (communications with other health care professionals or caregivers) and documentation in the medical record.

## 2021-03-15 ENCOUNTER — Telehealth: Payer: Self-pay | Admitting: Hematology and Oncology

## 2021-03-15 NOTE — Telephone Encounter (Signed)
Scheduled per 4/28 los. Called pt and left a msg

## 2021-05-03 ENCOUNTER — Ambulatory Visit (HOSPITAL_COMMUNITY): Payer: Medicare HMO

## 2021-05-10 ENCOUNTER — Other Ambulatory Visit: Payer: Self-pay

## 2021-05-10 ENCOUNTER — Encounter (HOSPITAL_COMMUNITY): Payer: Self-pay

## 2021-05-10 ENCOUNTER — Ambulatory Visit (HOSPITAL_COMMUNITY)
Admission: RE | Admit: 2021-05-10 | Discharge: 2021-05-10 | Disposition: A | Discharge status: Home / Self Care | Payer: Medicare HMO | Source: Ambulatory Visit | Attending: Adult Health | Admitting: Adult Health

## 2021-05-10 DIAGNOSIS — Z170 Estrogen receptor positive status [ER+]: Secondary | ICD-10-CM | POA: Diagnosis present

## 2021-05-10 DIAGNOSIS — C50412 Malignant neoplasm of upper-outer quadrant of left female breast: Secondary | ICD-10-CM | POA: Diagnosis present

## 2021-05-10 LAB — POCT I-STAT CREATININE: Creatinine, Ser: 0.6 mg/dL (ref 0.44–1.00)

## 2021-05-10 MED ORDER — SODIUM CHLORIDE (PF) 0.9 % IJ SOLN
INTRAMUSCULAR | Status: AC
  Filled 2021-05-10: qty 50

## 2021-05-10 MED ORDER — IOHEXOL 300 MG/ML  SOLN
100.0000 mL | Freq: Once | INTRAMUSCULAR | Status: AC | PRN
  Administered 2021-05-10: 100 mL via INTRAVENOUS

## 2021-05-17 ENCOUNTER — Ambulatory Visit
Admission: RE | Admit: 2021-05-17 | Discharge: 2021-05-17 | Disposition: A | Discharge status: Home / Self Care | Payer: Medicare HMO | Source: Ambulatory Visit | Attending: Adult Health | Admitting: Adult Health

## 2021-05-17 ENCOUNTER — Other Ambulatory Visit: Payer: Self-pay

## 2021-05-17 DIAGNOSIS — C50412 Malignant neoplasm of upper-outer quadrant of left female breast: Secondary | ICD-10-CM

## 2021-05-20 ENCOUNTER — Telehealth: Payer: Self-pay | Admitting: Adult Health

## 2021-05-20 NOTE — Telephone Encounter (Signed)
LMOM with patient to return my call to discuss CT scan after receiving Dr. [name] impression and opinion on imaging resutls.    Colleen Bihari, NP

## 2021-05-25 ENCOUNTER — Other Ambulatory Visit: Payer: Self-pay | Admitting: Adult Health

## 2021-05-25 DIAGNOSIS — N289 Disorder of kidney and ureter, unspecified: Secondary | ICD-10-CM

## 2021-05-25 NOTE — Progress Notes (Signed)
Unchanged 2cm left upper pole renal lesion, thought to be cyst, however incompletely evaluated and ultimately indeterminate.  Dr. Suszanne Finch recommended renal protocol abdominal CT to further characterize. I reviewed this with Dr. Lindi Adie, who wants to proceed with this test.  I called Lucita Ferrara once I heard back from Dr. Lindi Adie, and she agrees to proceed.  Orders for CT renal protocol placed.    Colleen Bihari, NP

## 2021-05-31 ENCOUNTER — Other Ambulatory Visit: Payer: Self-pay

## 2021-05-31 ENCOUNTER — Ambulatory Visit (HOSPITAL_COMMUNITY)
Admission: RE | Admit: 2021-05-31 | Discharge: 2021-05-31 | Disposition: A | Discharge status: Home / Self Care | Payer: Medicare HMO | Source: Ambulatory Visit | Attending: Adult Health | Admitting: Adult Health

## 2021-05-31 DIAGNOSIS — N289 Disorder of kidney and ureter, unspecified: Secondary | ICD-10-CM | POA: Diagnosis present

## 2021-06-02 ENCOUNTER — Telehealth: Payer: Self-pay

## 2021-06-02 NOTE — Telephone Encounter (Signed)
-----   Message from Gardenia Phlegm, NP sent at 06/01/2021  9:06 AM EDT ----- Please let patient know that CT renal study didn't give [place] enough information and MRI was recommended.  If agreeable I will place the orders. :)  Thank you! ----- Message ----- From: Interface, Rad Results In Sent: 05/31/2021  11:28 PM EDT To: Gardenia Phlegm, NP

## 2021-06-02 NOTE — Telephone Encounter (Signed)
Attempted to call pt per below message. LVM for pt to return call.

## 2021-06-03 ENCOUNTER — Telehealth: Payer: Self-pay

## 2021-06-03 NOTE — Telephone Encounter (Signed)
Called pt back regarding CT.... radiology recommended MRI but pt has defibrillator. Will review with Colleen Bihari, NP and contact pt. Pt is in agreement.

## 2021-06-06 ENCOUNTER — Other Ambulatory Visit: Payer: Self-pay | Admitting: Adult Health

## 2021-06-06 DIAGNOSIS — N281 Cyst of kidney, acquired: Secondary | ICD-10-CM

## 2021-06-06 DIAGNOSIS — R911 Solitary pulmonary nodule: Secondary | ICD-10-CM

## 2021-06-06 NOTE — Progress Notes (Signed)
Reviewed imaging with Dr. Nance Pew. Patient was recommended to undergo CT chest W/o contrast to f/u on pulmonary nodule, and CT abdomen w/wo contrast, in comments note "renal protocol to evaluate left upper pole renal lesion".  I called Leonora and reviewed this with her.  I placed the orders accordingly.    Wilber Bihari, NP

## 2021-06-14 ENCOUNTER — Other Ambulatory Visit: Payer: Self-pay

## 2021-06-14 DIAGNOSIS — C50412 Malignant neoplasm of upper-outer quadrant of left female breast: Secondary | ICD-10-CM

## 2021-06-14 DIAGNOSIS — Z170 Estrogen receptor positive status [ER+]: Secondary | ICD-10-CM

## 2021-06-14 MED ORDER — ALENDRONATE SODIUM 70 MG PO TABS: 70.0000 mg | ORAL_TABLET | ORAL | 0 refills | Status: DC

## 2021-07-15 ENCOUNTER — Other Ambulatory Visit: Payer: Self-pay | Admitting: Gastroenterology

## 2021-08-03 NOTE — Progress Notes (Signed)
Attempted to obtain medical history via telephone, unable to reach at this time. No voicemail set up to return pre surgical testing department's phone call.

## 2021-08-11 ENCOUNTER — Encounter (HOSPITAL_COMMUNITY): Payer: Self-pay | Admitting: Gastroenterology

## 2021-08-11 NOTE — Anesthesia Preprocedure Evaluation (Addendum)
Anesthesia Evaluation  Patient identified by MRN, date of birth, ID band Patient awake    Reviewed: Allergy & Precautions, NPO status , Patient's Chart, lab work & pertinent test results  Airway Mallampati: II  TM Distance: >3 FB Neck ROM: Full    Dental  (+) Edentulous Upper, Dental Advisory Given   Pulmonary neg pulmonary ROS, Current Smoker and Patient abstained from smoking.,    Pulmonary exam normal breath sounds clear to auscultation       Cardiovascular hypertension, + CAD, + Peripheral Vascular Disease and +CHF  Normal cardiovascular exam+ Cardiac Defibrillator  Rhythm:Regular Rate:Normal  TTE 2017 Left ventricle: The cavity size was moderately to severely  dilated. There was mild concentric hypertrophy. Systolic function  was mildly to moderately reduced. The estimated ejection fraction  was in the range of 40% to 45%. Mild hypokinesis of the inferior  and inferoseptal myocardium. Features are consistent with a  pseudonormal left ventricular filling pattern, with concomitant  abnormal relaxation and increased filling pressure (grade 2  diastolic dysfunction). Doppler parameters are consistent with  elevated mean left atrial filling pressure.  - Aortic valve: There was no stenosis. There was no regurgitation.  - Mitral valve: Mobility of the anterior and posterior leaflet was  restricted. There was mild regurgitation. Effective regurgitant  orifice (PISA): 0.12 cm^2.  - Left atrium: The atrium was severely dilated.  - Right ventricle: The cavity size was normal. Wall thickness was  normal. Systolic function was normal.  - Pulmonary arteries: Systolic pressure was mildly increased. PA  peak pressure: 44 mm Hg (S).  - Inferior vena cava: The vessel was dilated. The respirophasic  diameter changes were in the normal range (>= 50%), consistent  with elevated central venous pressure.  -  Pericardium, extracardiac: There was a left pleural effusion.  Cath 2017 1.Severe single vessel CAD: Mid RCA to Dist RCA lesion, 100% stenosed. 2.There is moderate left ventricular systolic dysfunction. 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 4.Large PCWP V wave suggestive of potential ischemic MR 5.Severe systemic hypertension    The patient has ischemic cardiomyopathy with an occluded RCA and very fine collaterals to the distal RCA branch vessels. I suspect that this is a subacute occlusion dating back to her initial onset discomfort a week ago. The occlusion appears to be consolidated and likely difficult to open at this time. Could consider CTO intervention in the future.  Patient has severe systemic hypertension with elevated LVEDP and wedge pressure needs to be treated. She likely also needs additional diuresis.  Plan: Return to nursing unit for continued medical management. I have increased her Lasix to 40 mg twice a day by mouth Will defer to primary team and electrophysiology catheter inside the benefit for ICD.    Neuro/Psych  Headaches, Seizures -, Well Controlled,  PSYCHIATRIC DISORDERS Depression    GI/Hepatic Neg liver ROS, GERD  ,Crohn's   Endo/Other  diabetesHypothyroidism   Renal/GU negative Renal ROS  negative genitourinary   Musculoskeletal negative musculoskeletal ROS (+)   Abdominal   Peds  Hematology negative hematology ROS (+)   Anesthesia Other Findings   Reproductive/Obstetrics                            Anesthesia Physical Anesthesia Plan  ASA: 3  Anesthesia Plan: MAC   Post-op Pain Management:    Induction: Intravenous  PONV Risk Score and Plan:  Propofol infusion and Treatment may vary due to age or medical condition  Airway Management Planned: Natural Airway  Additional Equipment:   Intra-op Plan:    Post-operative Plan:   Informed Consent: I have reviewed the patients History and Physical, chart, labs and discussed the procedure including the risks, benefits and alternatives for the proposed anesthesia with the patient or authorized representative who has indicated his/her understanding and acceptance.     Dental advisory given  Plan Discussed with: CRNA  Anesthesia Plan Comments:         Anesthesia Quick Evaluation

## 2021-08-12 ENCOUNTER — Other Ambulatory Visit: Payer: Self-pay

## 2021-08-12 ENCOUNTER — Encounter (HOSPITAL_COMMUNITY)
Admission: RE | Disposition: A | Discharge status: Home / Self Care | Payer: Self-pay | Source: Ambulatory Visit | Attending: Gastroenterology

## 2021-08-12 ENCOUNTER — Ambulatory Visit (HOSPITAL_COMMUNITY): Payer: Medicare HMO | Admitting: Anesthesiology

## 2021-08-12 ENCOUNTER — Ambulatory Visit (HOSPITAL_COMMUNITY)
Admission: RE | Admit: 2021-08-12 | Discharge: 2021-08-12 | Disposition: A | Discharge status: Home / Self Care | Payer: Medicare HMO | Source: Ambulatory Visit | Attending: Gastroenterology | Admitting: Gastroenterology

## 2021-08-12 ENCOUNTER — Encounter (HOSPITAL_COMMUNITY): Payer: Self-pay | Admitting: Gastroenterology

## 2021-08-12 DIAGNOSIS — Z9012 Acquired absence of left breast and nipple: Secondary | ICD-10-CM | POA: Insufficient documentation

## 2021-08-12 DIAGNOSIS — Z91041 Radiographic dye allergy status: Secondary | ICD-10-CM | POA: Diagnosis not present

## 2021-08-12 DIAGNOSIS — D122 Benign neoplasm of ascending colon: Secondary | ICD-10-CM | POA: Insufficient documentation

## 2021-08-12 DIAGNOSIS — F17210 Nicotine dependence, cigarettes, uncomplicated: Secondary | ICD-10-CM | POA: Diagnosis not present

## 2021-08-12 DIAGNOSIS — Z91048 Other nonmedicinal substance allergy status: Secondary | ICD-10-CM | POA: Diagnosis not present

## 2021-08-12 DIAGNOSIS — Z923 Personal history of irradiation: Secondary | ICD-10-CM | POA: Insufficient documentation

## 2021-08-12 DIAGNOSIS — Z09 Encounter for follow-up examination after completed treatment for conditions other than malignant neoplasm: Secondary | ICD-10-CM | POA: Diagnosis present

## 2021-08-12 DIAGNOSIS — Z95810 Presence of automatic (implantable) cardiac defibrillator: Secondary | ICD-10-CM | POA: Diagnosis not present

## 2021-08-12 DIAGNOSIS — Z888 Allergy status to other drugs, medicaments and biological substances status: Secondary | ICD-10-CM | POA: Diagnosis not present

## 2021-08-12 DIAGNOSIS — Z853 Personal history of malignant neoplasm of breast: Secondary | ICD-10-CM | POA: Diagnosis not present

## 2021-08-12 DIAGNOSIS — Z9221 Personal history of antineoplastic chemotherapy: Secondary | ICD-10-CM | POA: Diagnosis not present

## 2021-08-12 DIAGNOSIS — K6389 Other specified diseases of intestine: Secondary | ICD-10-CM | POA: Insufficient documentation

## 2021-08-12 HISTORY — PX: COLONOSCOPY WITH PROPOFOL: SHX5780

## 2021-08-12 HISTORY — PX: POLYPECTOMY: SHX5525

## 2021-08-12 HISTORY — DX: Presence of automatic (implantable) cardiac defibrillator: Z95.810

## 2021-08-12 LAB — GLUCOSE, CAPILLARY: Glucose-Capillary: 141 mg/dL — ABNORMAL HIGH (ref 70–99)

## 2021-08-12 SURGERY — COLONOSCOPY WITH PROPOFOL
Anesthesia: Monitor Anesthesia Care

## 2021-08-12 MED ORDER — LACTATED RINGERS IV SOLN
INTRAVENOUS | Status: AC | PRN
  Administered 2021-08-12: 1000 mL via INTRAVENOUS

## 2021-08-12 MED ORDER — PROPOFOL 10 MG/ML IV BOLUS
INTRAVENOUS | Status: DC | PRN
  Administered 2021-08-12: 40 mg via INTRAVENOUS

## 2021-08-12 MED ORDER — PROPOFOL 1000 MG/100ML IV EMUL
INTRAVENOUS | Status: AC
  Filled 2021-08-12: qty 100

## 2021-08-12 MED ORDER — PHENYLEPHRINE HCL (PRESSORS) 10 MG/ML IV SOLN
INTRAVENOUS | Status: DC | PRN
  Administered 2021-08-12: 40 ug via INTRAVENOUS

## 2021-08-12 MED ORDER — SODIUM CHLORIDE 0.9 % IV SOLN: INTRAVENOUS | Status: DC

## 2021-08-12 MED ORDER — PROPOFOL 500 MG/50ML IV EMUL
INTRAVENOUS | Status: DC | PRN
  Administered 2021-08-12: 135 ug/kg/min via INTRAVENOUS

## 2021-08-12 MED ORDER — LIDOCAINE HCL (CARDIAC) PF 100 MG/5ML IV SOSY
PREFILLED_SYRINGE | INTRAVENOUS | Status: DC | PRN
  Administered 2021-08-12: 40 mg via INTRAVENOUS

## 2021-08-12 SURGICAL SUPPLY — 21 items
ELECT REM PT RETURN 9FT ADLT (ELECTROSURGICAL) IMPLANT
ELECTRODE REM PT RTRN 9FT ADLT (ELECTROSURGICAL) IMPLANT
FCP BXJMBJMB 240X2.8X (CUTTING FORCEPS)
FLOOR PAD 36X40 (MISCELLANEOUS) ×3 IMPLANT
FORCEPS BIOP RAD 4 LRG CAP 4 (CUTTING FORCEPS) IMPLANT
FORCEPS BIOP RJ4 240 W/NDL (CUTTING FORCEPS) IMPLANT
FORCEPS BXJMBJMB 240X2.8X (CUTTING FORCEPS) IMPLANT
INJECTOR/SNARE I SNARE (MISCELLANEOUS) IMPLANT
LUBRICANT JELLY 4.5OZ STERILE (MISCELLANEOUS) IMPLANT
MANIFOLD NEPTUNE II (INSTRUMENTS) IMPLANT
NEEDLE SCLEROTHERAPY 25GX240 (NEEDLE) IMPLANT
PAD FLOOR 36X40 (MISCELLANEOUS) ×2 IMPLANT
PROBE APC STR FIRE (PROBE) IMPLANT
PROBE INJECTION GOLD (MISCELLANEOUS)
PROBE INJECTION GOLD 7FR (MISCELLANEOUS) IMPLANT
SNARE ROTATE MED OVAL 20MM (MISCELLANEOUS) IMPLANT
SYR 50ML LL SCALE MARK (SYRINGE) IMPLANT
TRAP SPECIMEN MUCOUS 40CC (MISCELLANEOUS) IMPLANT
TUBING ENDO SMARTCAP PENTAX (MISCELLANEOUS) IMPLANT
TUBING IRRIGATION ENDOGATOR (MISCELLANEOUS) ×3 IMPLANT
WATER STERILE IRR 1000ML POUR (IV SOLUTION) IMPLANT

## 2021-08-12 NOTE — Op Note (Signed)
[facility] Patient Name: Colleen Lowery Procedure Date: 08/12/2021 MRN: [id number] Attending MD: Carol Ada , MD Date of Birth: 12-Jun-1941 CSN: [id number] Age: 80 Admit Type: Outpatient Procedure:                Colonoscopy Indications:              High risk colon cancer surveillance: Personal                            history of colonic polyps Providers:                Carol Ada, MD, Elmer Ramp. Tilden Dome, RN, Benetta Spar, Technician Referring MD:              Medicines:                Propofol per Anesthesia Complications:            No immediate complications. Estimated Blood Loss:     Estimated blood loss: none. Procedure:                Pre-Anesthesia Assessment:                           - Prior to the procedure, a History and Physical                            was performed, and patient medications and                            allergies were reviewed. The patient's tolerance of                            previous anesthesia was also reviewed. The risks                            and benefits of the procedure and the sedation                            options and risks were discussed with the patient.                            All questions were answered, and informed consent                            was obtained. Prior Anticoagulants: The patient has                            taken no previous anticoagulant or antiplatelet                            agents. ASA Grade Assessment: III - A patient with                            severe  systemic disease. After reviewing the risks                            and benefits, the patient was deemed in                            satisfactory condition to undergo the procedure.                           - Sedation was administered by an anesthesia                            professional. Deep sedation was attained.                           After obtaining informed consent, the  colonoscope                            was passed under direct vision. Throughout the                            procedure, the patient's blood pressure, pulse, and                            oxygen saturations were monitored continuously. The                            CF-HQ190L ([id number] Olympus colonoscope was                            introduced through the anus and advanced to the the                            cecum, identified by appendiceal orifice and                            ileocecal valve. The colonoscopy was performed                            without difficulty. The patient tolerated the                            procedure well. The quality of the bowel                            preparation was good. The ileocecal valve,                            appendiceal orifice, and rectum were photographed. Scope In: 9:35:32 AM Scope Out: 9:53:17 AM Scope Withdrawal Time: 0 hours 15 minutes 27 seconds  Total Procedure Duration: 0 hours 17 minutes 45 seconds  Findings:      Two sessile polyps were found in the ascending colon. The polyps were 2       to 3 mm in size. These polyps were  removed with a cold snare. Resection       and retrieval were complete.      A diffuse area of severe melanosis was found in the entire colon.      With her significant cardiac history and the current low risk findings       with the colonoscopy, no repeat surveillance colonoscopy is recommended. Impression:               - Two 2 to 3 mm polyps in the ascending colon,                            removed with a cold snare. Resected and retrieved.                           - Melanosis in the colon. Moderate Sedation:      Not Applicable - Patient had care per Anesthesia. Recommendation:           - Patient has a contact number available for                            emergencies. The signs and symptoms of potential                            delayed complications were discussed with the                             patient. Return to normal activities tomorrow.                            Written discharge instructions were provided to the                            patient.                           - Resume previous diet.                           - Continue present medications.                           - Await pathology results.                           - Repeat colonoscopy is not recommended for                            surveillance. Procedure Code(s):        --- Professional ---                           [phone], Colonoscopy, flexible; with removal of                            tumor(s), polyp(s), or other lesion(s) by snare  technique Diagnosis Code(s):        --- Professional ---                           K63.5, Polyp of colon                           Z86.010, Personal history of colonic polyps                           K63.89, Other specified diseases of intestine CPT copyright 2019 American Medical Association. All rights reserved. The codes documented in this report are preliminary and upon coder review may  be revised to meet current compliance requirements. Carol Ada, MD Carol Ada, MD 08/12/2021 9:57:19 AM This report has been signed electronically. Number of Addenda: 0

## 2021-08-12 NOTE — Transfer of Care (Signed)
Immediate Anesthesia Transfer of Care Note  Patient: Colleen Lowery  Procedure(s) Performed: Procedure(s): COLONOSCOPY WITH PROPOFOL (N/A) POLYPECTOMY  Patient Location: PACU  Anesthesia Type:MAC  Level of Consciousness:  sedated, patient cooperative and responds to stimulation  Airway & Oxygen Therapy:Patient Spontanous Breathing and Patient connected to face mask oxgen  Post-op Assessment:  Report given to PACU RN and Post -op Vital signs reviewed and stable  Post vital signs:  Reviewed and stable  Last Vitals:  Vitals:   08/12/21 0903  BP: (!) 174/99  Pulse: 78  Resp: 14  Temp: 36.9 C  SpO2: 801%    Complications: No apparent anesthesia complications

## 2021-08-12 NOTE — Discharge Instructions (Signed)
YOU HAD AN ENDOSCOPIC PROCEDURE TODAY: Refer to the procedure report and other information in the discharge instructions given to you for any specific questions about what was found during the examination. If this information does not answer your questions, please call [place] at [phone] to clarify.   YOU SHOULD EXPECT: Some feelings of bloating in the abdomen. Passage of more gas than usual. Walking can help get rid of the air that was put into your GI tract during the procedure and reduce the bloating. If you had a lower endoscopy (such as a colonoscopy or flexible sigmoidoscopy) you may notice spotting of blood in your stool or on the toilet paper. Some abdominal soreness may be present for a day or two, also.  DIET: Your first meal following the procedure should be a light meal and then it is ok to progress to your normal diet. A half-sandwich or bowl of soup is an example of a good first meal. Heavy or fried foods are harder to digest and may make you feel nauseous or bloated. Drink plenty of fluids but you should avoid alcoholic beverages for 24 hours.  ACTIVITY: Your care partner should take you home directly after the procedure. You should plan to take it easy, moving slowly for the rest of the day. You can resume normal activity the day after the procedure however YOU SHOULD NOT DRIVE, use power tools, machinery or perform tasks that involve climbing or major physical exertion for 24 hours (because of the sedation medicines used during the test).   SYMPTOMS TO REPORT IMMEDIATELY: A gastroenterologist can be reached at any hour. Please call [phone]  for any of the following symptoms:  Following lower endoscopy (colonoscopy, flexible sigmoidoscopy) Excessive amounts of blood in the stool  Significant tenderness, worsening of abdominal pains  Swelling of the abdomen that is new, acute  Fever of 100 or higher    FOLLOW UP:  If any biopsies were taken you will be  contacted by phone or by letter within the next 1-3 weeks. Call [phone]  if you have not heard about the biopsies in 3 weeks.  Please also call with any specific questions about appointments or follow up tests.

## 2021-08-12 NOTE — H&P (Signed)
Colleen Lowery HPI: The patient's colonoscopy on 02/16/2017 was positive for 9 adenomas.  The patient was diagnosed with breast cancer 03/2020.  She is now s/p left mastectomy, radiation, and chemo.  The patient is well at this time and she is currently clear of breast cancer.  She denies any issue with chest pain, SOB, MI, hematochezia, melena, or abdominal pain.  For her surgery her AICD needed to be removed and then reimplanted in the right side.  Past Medical History:  Diagnosis Date   AICD (automatic cardioverter/defibrillator) present    Allergy    Arthritis    FINGERS   Cancer ([place])    left breast   Cataract    right   Crohn disease ([place])    Depression    Diabetes mellitus without complication ([place])    TYPE 2 , TAKING OTC MED FORGYMEMA   GERD (gastroesophageal reflux disease)    Headache    sinus   Heart failure, acute, systolic and diastolic ([place]) MARCH 2876 DX   History of colonic polyps    Hypertension    Hypothyroidism    per patient, takes OTC meds when temperature drops low   IBS (irritable bowel syndrome)    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, 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   Thyroid disease    AGE 60'S UNTIL  2016 OFF ALL THRYOID MEDS NOW    Wears dentures    Wears glasses     Past Surgical History:  Procedure Laterality Date   ABDOMINAL HYSTERECTOMY     PARTIAL   BREAST BIOPSY Left 04/01/2019   INVASIVE DUCTAL CARCINOMA   BREAST SURGERY     CARDIAC CATHETERIZATION  02/10/2016   Procedure: Right/Left Heart Cath and Coronary Angiography;  Surgeon: Leonie Man, MD;  Location: [facility];  Service: Cardiovascular;;   CATARACT EXTRACTION W/ INTRAOCULAR LENS IMPLANT     left   COLONOSCOPY WITH PROPOFOL N/A 02/16/2017   Procedure: COLONOSCOPY WITH PROPOFOL;  Surgeon: Carol Ada, MD;  Location: [facility];  Service: Endoscopy;  Laterality: N/A;   EP IMPLANTABLE DEVICE N/A 02/10/2016   Procedure: ICD Implant;  Surgeon: Will Meredith Leeds, MD;  Location:  [facility];  Service: Cardiovascular;  Laterality: N/A;   ESOPHAGOGASTRODUODENOSCOPY (EGD) WITH PROPOFOL N/A 02/16/2017   Procedure: ESOPHAGOGASTRODUODENOSCOPY (EGD) WITH PROPOFOL;  Surgeon: Carol Ada, MD;  Location: [facility];  Service: Endoscopy;  Laterality: N/A;  reflux   IR IMAGING GUIDED PORT INSERTION  04/27/2020   MASTECTOMY Left    MASTECTOMY MODIFIED RADICAL Left 09/14/2020   Procedure: LEFT MASTECTOMY MODIFIED RADICAL;  Surgeon: Erroll Luna, MD;  Location: [facility];  Service: General;  Laterality: Left;  PEC BLOCK   TONSILLECTOMY      Family History  Problem Relation Age of Onset   Diabetes Mother    Stroke Mother    Diabetes Brother    Diabetes Maternal Grandmother    Diabetes Paternal Grandfather    Heart failure Paternal Grandfather    Hypertension Brother    Heart attack Neg Hx     Social History:  reports that she has been smoking cigarettes. She has a 12.50 pack-year smoking history. She has never used smokeless tobacco. She reports that she does not drink alcohol and does not use drugs.  Allergies:  Allergies  Allergen Reactions   Alcohol Anaphylaxis and Swelling    Purell hand sanitizer  Swelling in the luNGS, CHEST AND THROAT and seizures Problem with smell  not the Alcohol Other reaction(s): Other (See Comments) Causes seizures and swelling    Contrast Media [Iodinated Diagnostic Agents] Anaphylaxis    Throat closed, was 40 yrs ago   Atorvastatin     Myalgia    Dust Mite Extract Swelling    SWELLING IS GI SYSTEM   Latex     QUESTIONABLE INTOLERABLE MUCOUS OOZING OUT OF THROAT   Lisinopril Other (See Comments)    unknown   Molds & Smuts Swelling    SWELLING IS IN GI SYSTEM    Medications: Scheduled: Continuous:  sodium chloride     lactated ringers 1,000 mL (08/12/21 0907)    Results for orders placed or performed during the hospital encounter of 08/12/21 (from the past 24 hour(s))  Glucose, capillary     Status: Abnormal    Collection Time: 08/12/21  9:05 AM  Result Value Ref Range   Glucose-Capillary 141 (H) 70 - 99 mg/dL     No results found.  ROS:  As stated above in the HPI otherwise negative.  Blood pressure (!) 174/99, pulse 78, temperature 98.5 F (36.9 C), temperature source Oral, resp. rate 14, SpO2 100 %.    PE: Gen: NAD, Alert and Oriented HEENT:  [place]/AT, EOMI Neck: Supple, no LAD Lungs: CTA Bilaterally CV: RRR without M/G/R ABD: Soft, NTND, +BS Ext: No C/C/E  Assessment/Plan: 1) Personal history of polyps - colonoscopy.  Colleen Lowery D 08/12/2021, 9:19 AM

## 2021-08-12 NOTE — Anesthesia Postprocedure Evaluation (Signed)
Anesthesia Post Note  Patient: Colleen Lowery  Procedure(s) Performed: COLONOSCOPY WITH PROPOFOL POLYPECTOMY     Patient location during evaluation: Endoscopy Anesthesia Type: MAC Level of consciousness: awake and alert Pain management: pain level controlled Vital Signs Assessment: post-procedure vital signs reviewed and stable Respiratory status: spontaneous breathing, nonlabored ventilation, respiratory function stable and patient connected to nasal cannula oxygen Cardiovascular status: blood pressure returned to baseline and stable Postop Assessment: no apparent nausea or vomiting Anesthetic complications: no   No notable events documented.  Last Vitals:  Vitals:   08/12/21 1000 08/12/21 1013  BP: 106/73 (!) 146/63  Pulse: 96 77  Resp: (!) 23 20  Temp:    SpO2: 100% 99%    Last Pain:  Vitals:   08/12/21 0903  TempSrc: Oral  PainSc: 0-No pain                 Marykate Heuberger L Matther Labell

## 2021-08-15 ENCOUNTER — Encounter (HOSPITAL_COMMUNITY): Payer: Self-pay | Admitting: Gastroenterology

## 2021-08-15 LAB — SURGICAL PATHOLOGY

## 2021-09-07 NOTE — Progress Notes (Signed)
Patient Care Team: [name], MD as PCP - General (Family Medicine) Kyung Rudd, MD as Consulting Physician (Radiation Oncology) Nicholas Lose, MD as Consulting Physician (Hematology and Oncology) Erroll Luna, MD as Consulting Physician (General Surgery)  DIAGNOSIS:    ICD-10-CM   1. Malignant neoplasm of upper-outer quadrant of left breast in female, estrogen receptor positive ([place])  C50.412    Z17.0       SUMMARY OF ONCOLOGIC HISTORY: Oncology History  Malignant neoplasm of upper-outer quadrant of left breast in female, estrogen receptor positive ([place])  03/22/2020 Initial Diagnosis   Diffuse left breast swelling. skin thickening and two areas of irregular hypoechogenicity in the 2-3 o'clock region, 4.9cm, and at least 4 abnormal lymph nodes  with cortical thickening. 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 Administration: 0.25 mg (04/19/2020), 0.25 mg (05/10/2020), 0.25 mg (05/31/2020), 0.25 mg (06/21/2020), 0.25 mg (07/12/2020), 0.25 mg (08/02/2020)  methotrexate (PF) chemo injection 50 mg, 29.8 mg/m2 = 50.5 mg (100 % of original dose 30 mg/m2), Intravenous,  Once, 6 of 6 cycles. Dose modification: 30 mg/m2 (original dose 30 mg/m2, Cycle 1, Reason: Provider Judgment), 25 mg/m2 (original dose 30 mg/m2, Cycle 3, Reason: Provider Judgment). Administration: 50 mg (04/19/2020), 50 mg (05/10/2020), 42 mg (05/31/2020), 42 mg (06/21/2020), 42 mg (07/12/2020), 42 mg (08/02/2020)  cyclophosphamide (CYTOXAN) 840 mg in sodium chloride 0.9 % 250 mL chemo infusion, 500 mg/m2 = 840 mg (100 % of original dose 500 mg/m2), Intravenous,  Once, 6 of 6  cycles Dose modification: 500 mg/m2 (original dose 500 mg/m2, Cycle 1, Reason: Provider Judgment), 400 mg/m2 (original dose 500 mg/m2, Cycle 3, Reason: Provider Judgment). Administration: 840 mg (04/19/2020), 840 mg (05/10/2020), 680 mg (05/31/2020), 680 mg (06/21/2020), 680 mg (07/12/2020), 680 mg (08/02/2020)  fluorouracil (ADRUCIL) chemo injection 850 mg, 500 mg/m2 = 850 mg (100 % of original dose 500 mg/m2), Intravenous,  Once, 6 of 6 cycles. Dose modification: 500 mg/m2 (original dose 500 mg/m2, Cycle 1, Reason: Provider Judgment). Administration: 850 mg (04/19/2020), 850 mg (05/10/2020), 850 mg (05/31/2020), 850 mg (06/21/2020), 850 mg (07/12/2020), 850 mg (08/02/2020)   09/14/2020 Surgery   Left mastectomy ([name]) ([id number]): invasive and in situ ductal carcinoma, grade 2, 15cm, clear margins, with 14/14 lymph nodes positive for metastatic carcinoma with extracapsular extension.    10/28/2020 - 12/17/2020 Radiation Therapy   The patient initially received a dose of 50.4 Gy in 28 fractions to the left breast and supraclavicular region using whole-breast tangent fields. This was delivered using a 3-D conformal technique. The pt received a boost delivering an additional 10 Gy in 5 fractions using a electron boost with 29mV electrons. The total dose was 60.4 Gy.   11/2020 - 11/2027 Anti-estrogen oral therapy   Anastrozole     CHIEF COMPLIANT: Follow-up to discuss antiestrogen therapy  INTERVAL HISTORY: Colleen MONTEEis a 80y.o. with above-mentioned history of left breast cancer who completed neoadjuvant chemotherapy, underwent a left mastectomy, and is currently on radiation. Mammogram on 05/17/2021 showed no evidence of malignancy. She presents to the clinic today to discuss antiestrogen therapy.  She is tolerating anastrozole  extremely well without any problems or concerns.  ALLERGIES:  is allergic to alcohol, contrast media [iodinated diagnostic agents], atorvastatin, dust mite extract, latex,  lisinopril, and molds & smuts.  MEDICATIONS:  Current Outpatient Medications  Medication Sig Dispense Refill   alendronate (FOSAMAX) 70 MG tablet Take 1 tablet (70 mg total) by mouth once a week. Take with a full glass of water on an empty stomach. 12 tablet 0   Amino Acids (AMINO ACID PO) Take 4 capsules by mouth daily. Vitamin b6     anastrozole (ARIMIDEX) 1 MG tablet Take 1 tablet (1 mg total) by mouth daily. 90 tablet 3   Ascorbic Acid (VITAMIN C PO) Take 2,000 mg by mouth daily.     b complex vitamins tablet Take 1 tablet by mouth 3 (three) times a week.      Beta Carotene (VITAMIN A) 25000 UNIT capsule Take 50,000 Units by mouth daily.     CALCIUM PO Take 10 mLs by mouth daily. Liquid 300 MG /5 ML     Cholecalciferol (VITAMIN D3) 5000 units CAPS Take 5,000 Units by mouth daily. 2500 units per cap     furosemide (LASIX) 20 MG tablet Take 20 mg by mouth daily.     losartan (COZAAR) 50 MG tablet Take 50 mg by mouth daily.     MAGNESIUM PO Take 5 mLs by mouth daily. Liquid 2.5 ml/150 mg     methocarbamol (ROBAXIN) 500 MG tablet Take 1 tablet (500 mg total) by mouth every 6 (six) hours as needed for muscle spasms. (Patient not taking: Reported on 08/08/2021) 20 tablet 0   OVER THE COUNTER MEDICATION Take 1 Scoop by mouth daily. Branched chain amino acids 4000     OVER THE COUNTER MEDICATION Take 400-800 mg by mouth daily as needed (Based on Blood glucose). gymnema sylvestre     OVER THE COUNTER MEDICATION Take 5 capsules by mouth daily. Pau d' arco 500 mg each     OVER THE COUNTER MEDICATION Take 6 capsules by mouth daily. Caprylic acid     OVER THE COUNTER MEDICATION Take 5 capsules by mouth daily. Pancreatic enzymes     OVER THE COUNTER MEDICATION Take 4 capsules by mouth daily. Raw Adrenal     PANTETHINE PO Take 1-2 tablets by mouth daily as needed (Itching). Vitamin B5     potassium chloride SA (K-DUR,KLOR-CON) 20 MEQ tablet Take 20 mEq by mouth 2 (two) times daily.     predniSONE  (DELTASONE) 50 MG tablet Take 1 tab 13, 7, and 1 hour prior to CT scan.  Take benadryl 54m oral 1 hour prior to CT scan (Patient not taking: Reported on 08/08/2021) 3 tablet 0   traMADol (ULTRAM) 50 MG tablet Take 1 tablet (50 mg total) by mouth every 6 (six) hours as needed (mild pain). (Patient not taking: No sig reported) 30 tablet 0   No current facility-administered medications for this visit.    PHYSICAL EXAMINATION: ECOG PERFORMANCE STATUS: 1 - Symptomatic but completely ambulatory  There were no vitals filed for this visit. There were no vitals filed for this visit.  BREAST: No palpable masses or nodules in either right or left breasts. No palpable axillary supraclavicular or infraclavicular adenopathy no breast tenderness or nipple discharge. (exam performed in the presence of a chaperone)  LABORATORY DATA:  I have reviewed the data as listed CMP Latest Ref Rng & Units 05/10/2021 09/15/2020 09/15/2020  Glucose 70 - 99 mg/dL - 126(H) 836(HH)  BUN 8 -  23 mg/dL - 13 12  Creatinine 0.44 - 1.00 mg/dL 0.60 0.84 0.78  Sodium 135 - 145 mmol/L - 138 140  Potassium 3.5 - 5.1 mmol/L - 4.7 3.7  Chloride 98 - 111 mmol/L - 106 114(H)  CO2 22 - 32 mmol/L - 23 17(L)  Calcium 8.9 - 10.3 mg/dL - 8.8(L) 7.2(L)  Total Protein 6.5 - 8.1 g/dL - 5.8(L) 4.7(L)  Total Bilirubin 0.3 - 1.2 mg/dL - 0.6 0.5  Alkaline Phos 38 - 126 U/L - 85 70  AST 15 - 41 U/L - 18 15  ALT 0 - 44 U/L - 10 10    Lab Results  Component Value Date   WBC 10.1 09/15/2020   HGB 12.3 09/15/2020   HCT 36.8 09/15/2020   MCV 94.6 09/15/2020   PLT 209 09/15/2020   NEUTROABS 2.8 09/06/2020    ASSESSMENT & PLAN:  Malignant neoplasm of upper-outer quadrant of left breast in female, estrogen receptor positive ([facility]) /08/2020:diffuse left breast swelling. skin thickening and two areas of irregular hypoechogenicity in the 2-3 o'clock region, 4.9cm, and at least 4 abnormal lymph nodes  with cortical thickening. 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/20  showed invasive ductal carcinoma in the breast and axilla, grade 2, HER-2 negative (1+), ER+ 30%, PR+ 10%, Ki67 15% T4BN1 stage IIIb clinical stage Skin invasion versus inflammatory breast cancer   Treatment plan: 1.  Neoadjuvant chemotherapy with CMF 2.  09/14/2020:Left mastectomy ([name]): invasive and in situ ductal carcinoma, grade 2, 15cm, clear margins, with 14/14 lymph nodes positive for metastatic carcinoma with extracapsular extension.  ER 30%, PR 10%, HER-2 negative, Ki-67 15% 3.  Adjuvant radiation 10/28/2020-12/17/2020 4.  Followed by adjuvant antiestrogen therapy ------------------------------------------------------------------------------------------------------------------------------------------- Anastrozole toxicities: Denies any adverse effects of anastrozole  I recommend adding abemaciclib to her treatment plan. She is planning to get cataract surgery in the next couple of months.  We will connect with her in 3 months and then initiate abemaciclib at that time.  Telephone visit in 3 months.   No orders of the defined types were placed in this encounter.  The patient has a good understanding of the overall plan. she agrees with it. she will call with any problems that may develop before the next visit here.  Total time spent: 30 mins including face to face time and time spent for planning, charting and coordination of care  Rulon Eisenmenger, MD, MPH 09/09/2021  I, Thana Ates, am acting as scribe for Dr. Nicholas Lose.  I have reviewed the above documentation for accuracy and completeness, and I agree with the above.

## 2021-09-09 ENCOUNTER — Inpatient Hospital Stay: Payer: Medicare HMO | Attending: Hematology and Oncology | Admitting: Hematology and Oncology

## 2021-09-09 ENCOUNTER — Other Ambulatory Visit: Payer: Self-pay

## 2021-09-09 DIAGNOSIS — Z170 Estrogen receptor positive status [ER+]: Secondary | ICD-10-CM | POA: Diagnosis not present

## 2021-09-09 DIAGNOSIS — Z79811 Long term (current) use of aromatase inhibitors: Secondary | ICD-10-CM | POA: Diagnosis not present

## 2021-09-09 DIAGNOSIS — C50412 Malignant neoplasm of upper-outer quadrant of left female breast: Secondary | ICD-10-CM | POA: Insufficient documentation

## 2021-09-09 MED ORDER — ALENDRONATE SODIUM 70 MG PO TABS: 70.0000 mg | ORAL_TABLET | ORAL | 3 refills | Status: DC

## 2021-09-09 NOTE — Assessment & Plan Note (Addendum)
/  08/2020:diffuse left breast swelling. skin thickening and two areas of irregular hypoechogenicity in the 2-3 o'clock region, 4.9cm, and at least 4 abnormal lymph nodes with cortical thickening. 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, grade 2, HER-2 negative (1+), ER+ 30%, PR+ 10%, Ki67 15% T4BN1 stage IIIb clinical stage Skin invasion versus inflammatory breast cancer  Treatment plan: 1.Neoadjuvant chemotherapywith CMF 2.09/14/2020:Left mastectomy ([name]): invasive and in situ ductal carcinoma, grade 2, 15cm, clear margins, with 14/14 lymph nodes positive for metastatic carcinoma with extracapsular extension.ER 30%, PR 10%, HER-2 negative, Ki-67 15% 3.Adjuvant radiation 10/28/2020-12/17/2020 4.Followed by adjuvant antiestrogen therapy ------------------------------------------------------------------------------------------------------------------------------------------- Anastrozole toxicities:  I recommend adding abemaciclib to her treatment plan. Return to clinic in 2 weeks for follow-up with clinical pharmacy practitioner Cindra Eves

## 2021-11-22 ENCOUNTER — Other Ambulatory Visit: Payer: Self-pay | Admitting: *Deleted

## 2021-11-22 DIAGNOSIS — C50412 Malignant neoplasm of upper-outer quadrant of left female breast: Secondary | ICD-10-CM

## 2021-11-22 DIAGNOSIS — Z170 Estrogen receptor positive status [ER+]: Secondary | ICD-10-CM

## 2021-11-22 MED ORDER — PREDNISONE 50 MG PO TABS
ORAL_TABLET | ORAL | 0 refills | Status: DC
Start: 2021-11-22 — End: 2022-04-03

## 2021-11-22 NOTE — Progress Notes (Addendum)
Per MD request RN sent in prescription for prednisone 50 mg for 13 hour prep prior to contrast media.  RN reviewed prep instructions pt verbalized understanding.

## 2021-11-30 ENCOUNTER — Other Ambulatory Visit: Payer: Self-pay | Admitting: Surgery

## 2021-11-30 ENCOUNTER — Ambulatory Visit
Admission: RE | Admit: 2021-11-30 | Discharge: 2021-11-30 | Disposition: A | Discharge status: Home / Self Care | Payer: Medicare HMO | Source: Ambulatory Visit | Attending: Surgery | Admitting: Surgery

## 2021-11-30 ENCOUNTER — Other Ambulatory Visit: Payer: Self-pay

## 2021-11-30 DIAGNOSIS — Z853 Personal history of malignant neoplasm of breast: Secondary | ICD-10-CM

## 2021-12-01 ENCOUNTER — Encounter: Payer: Self-pay | Admitting: Surgery

## 2021-12-06 ENCOUNTER — Other Ambulatory Visit: Payer: Self-pay

## 2021-12-06 DIAGNOSIS — C50412 Malignant neoplasm of upper-outer quadrant of left female breast: Secondary | ICD-10-CM

## 2021-12-06 DIAGNOSIS — Z170 Estrogen receptor positive status [ER+]: Secondary | ICD-10-CM

## 2021-12-07 ENCOUNTER — Other Ambulatory Visit: Payer: Self-pay

## 2021-12-07 ENCOUNTER — Inpatient Hospital Stay: Payer: Medicare HMO | Attending: Hematology and Oncology

## 2021-12-07 ENCOUNTER — Ambulatory Visit (HOSPITAL_COMMUNITY)
Admission: RE | Admit: 2021-12-07 | Discharge: 2021-12-07 | Disposition: A | Discharge status: Home / Self Care | Payer: Medicare HMO | Source: Ambulatory Visit | Attending: Adult Health | Admitting: Adult Health

## 2021-12-07 DIAGNOSIS — N281 Cyst of kidney, acquired: Secondary | ICD-10-CM | POA: Diagnosis present

## 2021-12-07 DIAGNOSIS — R911 Solitary pulmonary nodule: Secondary | ICD-10-CM | POA: Insufficient documentation

## 2021-12-07 DIAGNOSIS — Z170 Estrogen receptor positive status [ER+]: Secondary | ICD-10-CM

## 2021-12-07 LAB — CBC WITH DIFFERENTIAL (CANCER CENTER ONLY)
Abs Immature Granulocytes: 0.05 10*3/uL (ref 0.00–0.07)
Basophils Absolute: 0 10*3/uL (ref 0.0–0.1)
Basophils Relative: 0 %
Eosinophils Absolute: 0 10*3/uL (ref 0.0–0.5)
Eosinophils Relative: 0 %
HCT: 39.5 % (ref 36.0–46.0)
Hemoglobin: 13.4 g/dL (ref 12.0–15.0)
Immature Granulocytes: 1 %
Lymphocytes Relative: 8 %
Lymphs Abs: 0.5 10*3/uL — ABNORMAL LOW (ref 0.7–4.0)
MCH: 29.9 pg (ref 26.0–34.0)
MCHC: 33.9 g/dL (ref 30.0–36.0)
MCV: 88.2 fL (ref 80.0–100.0)
Monocytes Absolute: 0.2 10*3/uL (ref 0.1–1.0)
Monocytes Relative: 2 %
Neutro Abs: 6 10*3/uL (ref 1.7–7.7)
Neutrophils Relative %: 89 %
Platelet Count: 265 10*3/uL (ref 150–400)
RBC: 4.48 MIL/uL (ref 3.87–5.11)
RDW: 13.3 % (ref 11.5–15.5)
WBC Count: 6.8 10*3/uL (ref 4.0–10.5)
nRBC: 0 % (ref 0.0–0.2)

## 2021-12-07 LAB — CMP (CANCER CENTER ONLY)
ALT: 9 U/L (ref 0–44)
AST: 16 U/L (ref 15–41)
Albumin: 4.5 g/dL (ref 3.5–5.0)
Alkaline Phosphatase: 95 U/L (ref 38–126)
Anion gap: 11 (ref 5–15)
BUN: 11 mg/dL (ref 8–23)
CO2: 19 mmol/L — ABNORMAL LOW (ref 22–32)
Calcium: 10 mg/dL (ref 8.9–10.3)
Chloride: 98 mmol/L (ref 98–111)
Creatinine: 0.88 mg/dL (ref 0.44–1.00)
GFR, Estimated: >60 mL/min (ref >60)
Glucose, Bld: 272 mg/dL — ABNORMAL HIGH (ref 70–99)
Potassium: 4.8 mmol/L (ref 3.5–5.1)
Sodium: 128 mmol/L — ABNORMAL LOW (ref 135–145)
Total Bilirubin: 0.3 mg/dL (ref 0.3–1.2)
Total Protein: 7.8 g/dL (ref 6.5–8.1)

## 2021-12-07 MED ORDER — IOHEXOL 300 MG/ML  SOLN
100.0000 mL | Freq: Once | INTRAMUSCULAR | Status: AC | PRN
  Administered 2021-12-07: 14:00:00 100 mL via INTRAVENOUS

## 2021-12-07 NOTE — Progress Notes (Signed)
HEMATOLOGY-ONCOLOGY TELEPHONE VISIT PROGRESS NOTE  I connected with Colleen Lowery on 12/09/2021 at 10:30 AM EST by telephone and verified that I am speaking with the correct person using two identifiers.  I discussed the limitations, risks, security and privacy concerns of performing an evaluation and management service by telephone and the availability of in person appointments.  I also discussed with the patient that there may be a patient responsible charge related to this service. The patient expressed understanding and agreed to proceed.   History of Present Illness: Colleen Lowery is a 81 y.o. female with above-mentioned history of  left breast cancer who completed neoadjuvant chemotherapy, underwent left mastectomy and radiation. She presents to via telephone today for follow-up.  She has been doing quite well without any problems or concerns.  Oncology History  Malignant neoplasm of upper-outer quadrant of left breast in female, estrogen receptor positive ([place])  03/22/2020 Initial Diagnosis   Diffuse left breast swelling. skin thickening and two areas of irregular hypoechogenicity in the 2-3 o'clock region, 4.9cm, and at least 4 abnormal lymph nodes  with cortical thickening. 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 Administration: 0.25 mg (04/19/2020), 0.25 mg (05/10/2020), 0.25 mg (05/31/2020), 0.25 mg (06/21/2020), 0.25 mg (07/12/2020), 0.25 mg (08/02/2020)  methotrexate (PF) chemo injection 50 mg, 29.8 mg/m2 = 50.5 mg (100 % of original dose 30 mg/m2), Intravenous,  Once, 6 of 6 cycles. Dose modification: 30 mg/m2 (original dose 30 mg/m2, Cycle  1, Reason: Provider Judgment), 25 mg/m2 (original dose 30 mg/m2, Cycle 3, Reason: Provider Judgment). Administration: 50 mg (04/19/2020), 50 mg (05/10/2020), 42 mg (05/31/2020), 42 mg (06/21/2020), 42 mg (07/12/2020), 42 mg (08/02/2020)  cyclophosphamide (CYTOXAN) 840 mg in sodium chloride 0.9 % 250 mL chemo infusion, 500 mg/m2 = 840 mg (100 % of original dose 500 mg/m2), Intravenous,  Once, 6 of 6 cycles Dose modification: 500 mg/m2 (original dose 500 mg/m2, Cycle 1, Reason: Provider Judgment), 400 mg/m2 (original dose 500 mg/m2, Cycle 3, Reason: Provider Judgment). Administration: 840 mg (04/19/2020), 840 mg (05/10/2020), 680 mg (05/31/2020), 680 mg (06/21/2020), 680 mg (07/12/2020), 680 mg (08/02/2020)  fluorouracil (ADRUCIL) chemo injection 850 mg, 500 mg/m2 = 850 mg (100 % of original dose 500 mg/m2), Intravenous,  Once, 6 of 6 cycles. Dose modification: 500 mg/m2 (original dose 500 mg/m2, Cycle 1, Reason: Provider Judgment). Administration: 850 mg (04/19/2020), 850 mg (05/10/2020), 850 mg (05/31/2020), 850 mg (06/21/2020), 850 mg (07/12/2020), 850 mg (08/02/2020)   09/14/2020 Surgery   Left mastectomy ([name]) ([id number]): invasive and in situ ductal carcinoma, grade 2, 15cm, clear margins, with 14/14 lymph nodes positive for metastatic carcinoma with extracapsular extension.    10/28/2020 - 12/17/2020 Radiation Therapy   The patient initially received a dose of 50.4 Gy in 28 fractions to the left breast and supraclavicular region using whole-breast tangent fields. This was delivered using a 3-D conformal technique. The pt received a boost delivering an additional 10 Gy in 5 fractions using a electron boost with 76mV electrons. The total dose was 60.4 Gy.   11/2020 - 11/2027 Anti-estrogen oral therapy   Anastrozole  Observations/Objective:     Assessment Plan:  Malignant neoplasm of upper-outer quadrant of left breast in female, estrogen receptor positive (Colleen Lowery) 03/22/2020:diffuse left breast swelling. skin  thickening and two areas of irregular hypoechogenicity in the 2-3 o'clock region, 4.9cm, and at least 4 abnormal lymph nodes  with cortical thickening. 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  Neoadjuvant chemotherapy with CMF 2.  09/14/2020:Left mastectomy ([name]): invasive and in situ ductal carcinoma, grade 2, 15cm, clear margins, with 14/14 lymph nodes positive for metastatic carcinoma with extracapsular extension.  ER 30%, PR 10%, HER-2 negative, Ki-67 15% 3.  Adjuvant radiation 10/28/2020-12/17/2020 4.  Followed by adjuvant antiestrogen therapy ------------------------------------------------------------------------------------------------------------------------------------------- Anastrozole toxicities: Denies any adverse effects of anastrozole   CT chest abdomen pelvis: 12/07/2021: New enhancing lesion liver dome 3.2 centimeters, left kidney hemorrhagic cyst (likely benign), unchanged groundglass opacity right lower lobe 0.6 cm (nonspecific)  I discussed with her that the lesion on the liver is new and is concerning. I will request interventional radiology to see if they could do an ultrasound-guided liver biopsy. Follow-up after the biopsy to discuss results. We have not discussed adjuvant abemaciclib at this time because of the above findings.  Based on the pathology results we may have to discuss prognosis and treatment options.   I discussed the assessment and treatment plan with the patient. The patient was provided an opportunity to ask questions and all were answered. The patient agreed with the plan and demonstrated an understanding of the instructions. The patient was advised to call back or seek an in-person evaluation if the symptoms worsen or if the condition fails to improve as  anticipated.   Total time spent: 15 mins including non-face to face time and time spent for planning, charting and coordination of care  Rulon Eisenmenger, MD 12/09/2021    I, Thana Ates, am acting as scribe for Nicholas Lose, MD.  I have reviewed the above documentation for accuracy and completeness, and I agree with the above.

## 2021-12-09 ENCOUNTER — Inpatient Hospital Stay (HOSPITAL_BASED_OUTPATIENT_CLINIC_OR_DEPARTMENT_OTHER): Payer: Medicare HMO | Admitting: Hematology and Oncology

## 2021-12-09 DIAGNOSIS — Z170 Estrogen receptor positive status [ER+]: Secondary | ICD-10-CM

## 2021-12-09 DIAGNOSIS — C50412 Malignant neoplasm of upper-outer quadrant of left female breast: Secondary | ICD-10-CM

## 2021-12-09 DIAGNOSIS — R160 Hepatomegaly, not elsewhere classified: Secondary | ICD-10-CM | POA: Diagnosis not present

## 2021-12-09 NOTE — Assessment & Plan Note (Signed)
03/22/2020:diffuse left breast swelling. skin thickening and two areas of irregular hypoechogenicity in the 2-3 o'clock region, 4.9cm, and at least 4 abnormal lymph nodes with cortical thickening. 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, grade 2, HER-2 negative (1+), ER+ 30%, PR+ 10%, Ki67 15% T4BN1 stage IIIb clinical stage Skin invasion versus inflammatory breast cancer  Treatment plan: 1.Neoadjuvant chemotherapywith CMF 2.09/14/2020:Left mastectomy ([name]): invasive and in situ ductal carcinoma, grade 2, 15cm, clear margins, with 14/14 lymph nodes positive for metastatic carcinoma with extracapsular extension.ER 30%, PR 10%, HER-2 negative, Ki-67 15% 3.Adjuvant radiation 10/28/2020-12/17/2020 4.Followed by adjuvant antiestrogen therapy ------------------------------------------------------------------------------------------------------------------------------------------- Anastrozole toxicities: Denies any adverse effects of anastrozole  I recommend adding abemaciclib to her treatment plan. She is planning to get cataract surgery in the next couple of months.  We will connect with her in 3 months and then initiate abemaciclib at that time.  Abemaciclib counseling: I discussed at length the risks and benefits of Abemaciclib in combination with letrozole. Adverse effects of Abemaciclib include decreasing neutrophil count, pneumonia, blood clots in lungs as well as nausea and GI symptoms. Side effects of letrozole include hot flashes, muscle aches and pains, uterine bleeding/spotting/cancer, osteoporosis, risk of blood clots.  We will start abemaciclib at 50 mg twice a day for the first month Follow-up in 2 weeks with Cindra Eves for toxicity assessment

## 2021-12-12 ENCOUNTER — Encounter (HOSPITAL_COMMUNITY): Payer: Self-pay

## 2021-12-12 NOTE — Progress Notes (Signed)
Colleen Lowery, Colleen Lowery Legal Sex  Female DOB  April 17, 1941 SSN  [id number] Address  [place] [address] Phone  [phone] [facility]) *[phone] (Mobile)    RE: US BIOPSY Received: Today Sandi Mariscal, MD  Colleen Lowery [place] for US guided liver lesion Bx.   CT AP - image 1, series 2; Chest CT - image 105, series 2.   Challenging location given central location near the dome.   Cathren Harsh        Previous Messages   ----- Message -----  From: Colleen Lowery  Sent: 12/09/2021   1:23 PM EST  To: [facility] Procedure Requests  Subject: US BIOPSY                                       US BIOPSY        Reason: Liver mass        History: CT in computer        Provider:Gudena, Vinay       Contact:[phone]

## 2021-12-14 ENCOUNTER — Telehealth: Payer: Self-pay | Admitting: Hematology and Oncology

## 2021-12-14 NOTE — Telephone Encounter (Signed)
Sch per 1/31 inbasket, pt aware

## 2021-12-22 ENCOUNTER — Other Ambulatory Visit: Payer: Self-pay | Admitting: Radiology

## 2021-12-22 ENCOUNTER — Other Ambulatory Visit (HOSPITAL_COMMUNITY): Payer: Self-pay | Admitting: Physician Assistant

## 2021-12-23 ENCOUNTER — Other Ambulatory Visit: Payer: Self-pay | Admitting: Internal Medicine

## 2021-12-26 ENCOUNTER — Encounter (HOSPITAL_COMMUNITY): Payer: Self-pay

## 2021-12-26 ENCOUNTER — Ambulatory Visit (HOSPITAL_COMMUNITY)
Admission: RE | Admit: 2021-12-26 | Discharge: 2021-12-26 | Disposition: A | Discharge status: Home / Self Care | Payer: Medicare HMO | Source: Ambulatory Visit | Attending: Hematology and Oncology | Admitting: Hematology and Oncology

## 2021-12-26 DIAGNOSIS — K5900 Constipation, unspecified: Secondary | ICD-10-CM | POA: Insufficient documentation

## 2021-12-26 DIAGNOSIS — I509 Heart failure, unspecified: Secondary | ICD-10-CM | POA: Insufficient documentation

## 2021-12-26 DIAGNOSIS — I110 Hypertensive heart disease with heart failure: Secondary | ICD-10-CM | POA: Insufficient documentation

## 2021-12-26 DIAGNOSIS — J3489 Other specified disorders of nose and nasal sinuses: Secondary | ICD-10-CM | POA: Diagnosis not present

## 2021-12-26 DIAGNOSIS — Z9012 Acquired absence of left breast and nipple: Secondary | ICD-10-CM | POA: Diagnosis not present

## 2021-12-26 DIAGNOSIS — K5090 Crohn's disease, unspecified, without complications: Secondary | ICD-10-CM | POA: Insufficient documentation

## 2021-12-26 DIAGNOSIS — K769 Liver disease, unspecified: Secondary | ICD-10-CM | POA: Diagnosis not present

## 2021-12-26 DIAGNOSIS — Z9221 Personal history of antineoplastic chemotherapy: Secondary | ICD-10-CM | POA: Insufficient documentation

## 2021-12-26 DIAGNOSIS — Z853 Personal history of malignant neoplasm of breast: Secondary | ICD-10-CM | POA: Diagnosis not present

## 2021-12-26 DIAGNOSIS — R519 Headache, unspecified: Secondary | ICD-10-CM | POA: Insufficient documentation

## 2021-12-26 DIAGNOSIS — Z923 Personal history of irradiation: Secondary | ICD-10-CM | POA: Diagnosis not present

## 2021-12-26 DIAGNOSIS — E119 Type 2 diabetes mellitus without complications: Secondary | ICD-10-CM | POA: Insufficient documentation

## 2021-12-26 DIAGNOSIS — Z95810 Presence of automatic (implantable) cardiac defibrillator: Secondary | ICD-10-CM | POA: Diagnosis not present

## 2021-12-26 DIAGNOSIS — R160 Hepatomegaly, not elsewhere classified: Secondary | ICD-10-CM | POA: Insufficient documentation

## 2021-12-26 DIAGNOSIS — F32A Depression, unspecified: Secondary | ICD-10-CM | POA: Insufficient documentation

## 2021-12-26 LAB — CBC
HCT: 41.8 % (ref 36.0–46.0)
Hemoglobin: 13.4 g/dL (ref 12.0–15.0)
MCH: 29.7 pg (ref 26.0–34.0)
MCHC: 32.1 g/dL (ref 30.0–36.0)
MCV: 92.7 fL (ref 80.0–100.0)
Platelets: 249 10*3/uL (ref 150–400)
RBC: 4.51 MIL/uL (ref 3.87–5.11)
RDW: 13.6 % (ref 11.5–15.5)
WBC: 5.2 10*3/uL (ref 4.0–10.5)
nRBC: 0 % (ref 0.0–0.2)

## 2021-12-26 LAB — PROTIME-INR
INR: 0.9 (ref 0.8–1.2)
Prothrombin Time: 12.1 seconds (ref 11.4–15.2)

## 2021-12-26 LAB — GLUCOSE, CAPILLARY: Glucose-Capillary: 188 mg/dL — ABNORMAL HIGH (ref 70–99)

## 2021-12-26 MED ORDER — FENTANYL CITRATE (PF) 100 MCG/2ML IJ SOLN
INTRAMUSCULAR | Status: AC | PRN
Start: 2021-12-26 — End: 2021-12-26
  Administered 2021-12-26: 25 ug via INTRAVENOUS
  Administered 2021-12-26: 50 ug via INTRAVENOUS

## 2021-12-26 MED ORDER — HYDROCODONE-ACETAMINOPHEN 5-325 MG PO TABS: 1.0000 | ORAL_TABLET | ORAL | Status: DC | PRN

## 2021-12-26 MED ORDER — MIDAZOLAM HCL 2 MG/2ML IJ SOLN
INTRAMUSCULAR | Status: AC | PRN
  Administered 2021-12-26: .5 mg via INTRAVENOUS
  Administered 2021-12-26: 1 mg via INTRAVENOUS

## 2021-12-26 MED ORDER — FENTANYL CITRATE (PF) 100 MCG/2ML IJ SOLN
INTRAMUSCULAR | Status: AC
  Filled 2021-12-26: qty 2

## 2021-12-26 MED ORDER — MIDAZOLAM HCL 2 MG/2ML IJ SOLN
INTRAMUSCULAR | Status: AC
  Filled 2021-12-26: qty 2

## 2021-12-26 MED ORDER — SODIUM CHLORIDE 0.9 % IV SOLN: INTRAVENOUS | Status: DC

## 2021-12-26 MED ORDER — LIDOCAINE HCL (PF) 1 % IJ SOLN
INTRAMUSCULAR | Status: AC
  Filled 2021-12-26: qty 30

## 2021-12-26 MED ORDER — GELATIN ABSORBABLE 12-7 MM EX MISC
CUTANEOUS | Status: AC
  Filled 2021-12-26: qty 1

## 2021-12-26 NOTE — Sedation Documentation (Signed)
Patient is resting comfortably. Procedure continues 

## 2021-12-26 NOTE — Sedation Documentation (Signed)
Patient is resting comfortably. 

## 2021-12-26 NOTE — Sedation Documentation (Signed)
Vital signs stable. 

## 2021-12-26 NOTE — Progress Notes (Signed)
Patient was given discharge instructions and verbalized understanding. 

## 2021-12-26 NOTE — Sedation Documentation (Signed)
Patient is resting comfortably. Biopsies taken. 

## 2021-12-26 NOTE — Procedures (Signed)
Interventional Radiology Procedure:   Indications: New liver lesion, history of breast cancer   Procedure: US guided liver lesion biopsy  Findings: Hypoechoic lesion at hepatic dome.  3 cores obtained.   Complications: No immediate complications noted.     EBL: Minimal  Plan: Bedrest 3 hours   Kane Kusek R. Anselm Pancoast, MD  Pager: [phone]

## 2021-12-26 NOTE — H&P (Signed)
Chief Complaint: Patient was seen in consultation today for liver lesion biopsy  Referring Physician(s): Gudena,Vinay  Supervising Physician: Markus Daft  Patient Status: [facility] - Out-pt  History of Present Illness: Colleen Lowery is a 81 y.o. female with a medical history significant for HTN, DM2, depression, heart failure (right side AICD), Crohn's disease and left breast cancer diagnosed 03/2020. She is s/p left mastectomy, radiation and chemotherapy. Recent imaging revealed a new liver lesion concerning for metastatic disease.   CT abdomen/pelvis 12/07/21 IMPRESSION: 1. New hypoenhancing lesion of the liver dome, consistent with hepatic metastasis. 2. Unchanged, minimally proteinaceous or hemorrhagic cyst of the superior pole of left kidney, without evidence of solid component or contrast enhancement. No further follow-up or characterization is specifically required for this benign lesion. 3. Unchanged, somewhat ill-defined ground-glass opacity of the dependent right lower lobe, measuring 0.6 cm, nonspecific and infectious or inflammatory. No specific further follow-up or characterization is required. 4. Status post left mastectomy and left axillary lymph node dissection with radiation fibrosis of the left upper lobe. 5. Coronary artery disease. 6. Aortic Atherosclerosis (ICD10-I70.0).  Interventional Radiology has been asked to evaluate this patient for an image-guided liver lesion biopsy. Imaging reviewed and procedure approved by Dr. Pascal Lux.    Past Medical History:  Diagnosis Date   AICD (automatic cardioverter/defibrillator) present    Allergy    Arthritis    FINGERS   Cancer ([place])    left breast   Cataract    right   Crohn disease ([place])    Depression    Diabetes mellitus without complication ([place])    TYPE 2 , TAKING OTC MED FORGYMEMA   GERD (gastroesophageal reflux disease)    Headache    sinus   Heart failure, acute, systolic and diastolic ([place]) MARCH [id number] DX    History of colonic polyps    Hypertension    Hypothyroidism    per patient, takes OTC meds when temperature drops low   IBS (irritable bowel syndrome)    Seizures ([place])    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, 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   Thyroid disease    AGE 30'S UNTIL  2016 OFF ALL THRYOID MEDS NOW    Wears dentures    Wears glasses     Past Surgical History:  Procedure Laterality Date   ABDOMINAL HYSTERECTOMY     PARTIAL   BREAST BIOPSY Left 04/01/2019   INVASIVE DUCTAL CARCINOMA   BREAST SURGERY     CARDIAC CATHETERIZATION  02/10/2016   Procedure: Right/Left Heart Cath and Coronary Angiography;  Surgeon: Leonie Man, MD;  Location: [facility];  Service: Cardiovascular;;   CATARACT EXTRACTION W/ INTRAOCULAR LENS IMPLANT     left   COLONOSCOPY WITH PROPOFOL N/A 02/16/2017   Procedure: COLONOSCOPY WITH PROPOFOL;  Surgeon: Carol Ada, MD;  Location: [facility];  Service: Endoscopy;  Laterality: N/A;   COLONOSCOPY WITH PROPOFOL N/A 08/12/2021   Procedure: COLONOSCOPY WITH PROPOFOL;  Surgeon: Carol Ada, MD;  Location: [facility];  Service: Endoscopy;  Laterality: N/A;   EP IMPLANTABLE DEVICE N/A 02/10/2016   Procedure: ICD Implant;  Surgeon: Will Meredith Leeds, MD;  Location: [facility];  Service: Cardiovascular;  Laterality: N/A;   ESOPHAGOGASTRODUODENOSCOPY (EGD) WITH PROPOFOL N/A 02/16/2017   Procedure: ESOPHAGOGASTRODUODENOSCOPY (EGD) WITH PROPOFOL;  Surgeon: Carol Ada, MD;  Location: [facility];  Service: Endoscopy;  Laterality: N/A;  reflux   IR IMAGING GUIDED PORT INSERTION  04/27/2020   MASTECTOMY Left    MASTECTOMY  MODIFIED RADICAL Left 09/14/2020   Procedure: LEFT MASTECTOMY MODIFIED RADICAL;  Surgeon: Erroll Luna, MD;  Location: [facility];  Service: General;  Laterality: Left;  PEC BLOCK   POLYPECTOMY  08/12/2021   Procedure: POLYPECTOMY;  Surgeon: Carol Ada, MD;  Location: [facility];  Service: Endoscopy;;   TONSILLECTOMY       Allergies: Alcohol, Contrast media [iodinated contrast media], Atorvastatin, Dust mite extract, Latex, Lisinopril, and Molds & smuts  Medications: Prior to Admission medications   Medication Sig Start Date End Date Taking? Authorizing Provider  ADRENAL CORTEX PO Take 4 capsules by mouth daily.   Yes [provider]  alendronate (FOSAMAX) 70 MG tablet Take 1 tablet (70 mg total) by mouth once a week. Take with a full glass of water on an empty stomach. Patient taking differently: Take 70 mg by mouth every Sunday. Take with a full glass of water on an empty stomach. 09/09/21  Yes Nicholas Lose, MD  anastrozole (ARIMIDEX) 1 MG tablet Take 1 tablet (1 mg total) by mouth daily. 01/07/21  Yes Nicholas Lose, MD  Ascorbic Acid (VITAMIN C PO) Take 2,000 mg by mouth daily.   Yes [provider]  B Complex-Folic Acid (B COMPLEX PLUS PO) Take 4 tablets by mouth daily.   Yes [provider]  Beta Carotene (VITAMIN A) 25000 UNIT capsule Take 50,000 Units by mouth daily.   Yes [provider]  CALCIUM PO Take 10 mLs by mouth daily. Liquid 300 MG /5 ML   Yes [provider]  Cholecalciferol (VITAMIN D3) 5000 units CAPS Take 5,000 Units by mouth daily. 2500 units per cap   Yes [provider]  Flaxseed, Linseed, (FLAXSEED OIL) 1000 MG CAPS Take 3,000 mg by mouth daily.   Yes [provider]  furosemide (LASIX) 20 MG tablet Take 20 mg by mouth daily.   Yes [provider]  Homeopathic Products (ARNICARE PAIN RELIEF EX) Apply 1 application topically daily as needed (pain).   Yes [provider]  losartan (COZAAR) 50 MG tablet Take 50 mg by mouth daily. 12/16/19  Yes [provider]  MAGNESIUM PO Take 5 mLs by mouth daily. Liquid 2.5 ml/150 mg   Yes [provider]  methocarbamol (ROBAXIN) 500 MG tablet Take 1 tablet (500 mg total) by mouth every 6 (six) hours as needed for muscle spasms. 09/15/20  Yes [name],  Marcello Moores, MD  Misc Natural Products (BLOOD SUGAR BALANCE PO) Take 3 capsules by mouth daily. Blood Sugar Manager   Yes [provider]  OVER THE COUNTER MEDICATION Take 1 Scoop by mouth daily. Branched chain amino acids 4000   Yes [provider]  OVER THE COUNTER MEDICATION Take 400-800 mg by mouth daily as needed (Based on Blood glucose). gymnema sylvestre   Yes [provider]  OVER THE COUNTER MEDICATION Take 5 capsules by mouth daily. Pau d' arco 500 mg each   Yes [provider]  OVER THE COUNTER MEDICATION Take 6 capsules by mouth daily. Caprylic acid   Yes [provider]  OVER THE COUNTER MEDICATION Take 5 capsules by mouth daily. Pancreatic enzymes   Yes [provider]  OVER THE COUNTER MEDICATION Take 3 capsules by mouth daily. Raw Adrenal   Yes [provider]  OVER THE COUNTER MEDICATION Take 750 mg by mouth daily. Gaba Supplement   Yes [provider]  OVER THE COUNTER MEDICATION Take 6 capsules by mouth daily. Beef Pancreas   Yes [provider]  PANTETHINE PO Take 1-2 tablets by mouth daily as needed (Itching). Vitamin B5   Yes [provider]  potassium chloride SA (K-DUR,KLOR-CON) 20 MEQ tablet Take 20 mEq by mouth 2 (two) times daily.   Yes [provider]  Propylene Glycol (SYSTANE BALANCE) 0.6 % SOLN Place 2 drops into the right eye in the morning and at bedtime.   Yes [provider]  predniSONE (DELTASONE) 50 MG tablet Take 1 tab 13, 7, and 1 hour prior to CT scan.  Take benadryl 50mg  oral 1 hour prior to CT scan Patient not taking: Reported on 12/21/2021 11/22/21   Nicholas Lose, MD  prochlorperazine (COMPAZINE) 10 MG tablet Take 1 tablet (10 mg total) by mouth every 6 (six) hours as needed (Nausea or vomiting). Patient not taking: No sig reported 04/19/20 09/08/21  Nicholas Lose, MD     Family History  Problem Relation Age of Onset   Diabetes Mother    Stroke Mother     Diabetes Brother    Diabetes Maternal Grandmother    Diabetes Paternal Grandfather    Heart failure Paternal Grandfather    Hypertension Brother    Heart attack Neg Hx     Social History   Socioeconomic History   Marital status: Widowed    Spouse name: Not on file   Number of children: Not on file   Years of education: Not on file   Highest education level: Not on file  Occupational History   Not on file  Tobacco Use   Smoking status: Every Day    Packs/day: 0.25    Years: 50.00    Pack years: 12.50    Types: Cigarettes   Smokeless tobacco: Never   Tobacco comments:    3 puffs of each cigarette  Vaping Use   Vaping Use: Never used  Substance and Sexual Activity   Alcohol use: No   Drug use: No   Sexual activity: Not Currently  Other Topics Concern   Not on file  Social History Narrative   Not on file   Social Determinants of Health   Financial Resource Strain: Not on file  Food Insecurity: Not on file  Transportation Needs: Not on file  Physical Activity: Not on file  Stress: Not on file  Social Connections: Not on file    Review of Systems: A 12 point ROS discussed and pertinent positives are indicated in the HPI above.  All other systems are negative.  Review of Systems  Constitutional:  Negative for appetite change and fatigue.  HENT:  Positive for sinus pressure.   Respiratory:  Negative for cough and shortness of breath.   Cardiovascular:  Negative for chest pain and leg swelling.  Gastrointestinal:  Positive for constipation. Negative for abdominal pain, nausea and vomiting.  Neurological:  Positive for headaches. Negative for dizziness.   Vital Signs: BP (!) 171/90    Pulse 81    Temp 97.8 F (36.6 C) (Oral)    Resp 16    Ht 5' 2.5" (1.588 m)    Wt 147 lb (66.7 kg)    SpO2 100%    BMI 26.46 kg/m   Physical Exam Constitutional:      General: She is not in acute distress.    Appearance: She is not ill-appearing.  HENT:     Mouth/Throat:      Mouth: Mucous membranes are moist.     Pharynx: Oropharynx is clear.     Comments: edentulous Cardiovascular:  Rate and Rhythm: Normal rate and regular rhythm.     Pulses: Normal pulses.     Heart sounds: Normal heart sounds.     Comments: Right upper chest AICD Pulmonary:     Effort: Pulmonary effort is normal.     Breath sounds: Normal breath sounds.  Abdominal:     General: Bowel sounds are normal.     Palpations: Abdomen is soft.     Tenderness: There is no abdominal tenderness.  Musculoskeletal:     Right lower leg: No edema.     Left lower leg: No edema.     Comments: Left mastectomy; occasional left chest wall discomfort  Skin:    General: Skin is warm and dry.  Neurological:     Mental Status: She is alert and oriented to person, place, and time.    Imaging: CT Abdomen Pelvis W Wo Contrast  Result Date: 12/08/2021 CLINICAL DATA:  Renal lesion, left upper pole renal lesion, lung nodule, history of left breast cancer, status post chemotherapy and XRT EXAM: CT CHEST WITHOUT CONTRAST CT ABDOMEN AND PELVIS WITH AND WITHOUT CONTRAST TECHNIQUE: Multidetector CT imaging of the chest was performed without intravenous contrast administration. Multidetector CT imaging of the abdomen and pelvis was performed following the standard protocol before and during bolus administration of intravenous contrast. RADIATION DOSE REDUCTION: This exam was performed according to the departmental dose-optimization program which includes automated exposure control, adjustment of the mA and/or kV according to patient size and/or use of iterative reconstruction technique. CONTRAST:  172mL OMNIPAQUE IOHEXOL 300 MG/ML SOLN, additional oral enteric contrast COMPARISON:  CT abdomen pelvis, 05/31/2021, CT chest abdomen pelvis, 05/10/2021 FINDINGS: CT CHEST FINDINGS Cardiovascular: Right chest pacer defibrillator. Aortic atherosclerosis. Normal heart size. Three-vessel coronary artery calcifications. No  pericardial effusion. Mediastinum/Nodes: No enlarged mediastinal, hilar, or axillary lymph nodes. Small hiatal hernia. Thyroid gland, trachea, and esophagus demonstrate no significant findings. Lungs/Pleura: Unchanged, somewhat ill-defined ground-glass opacity of the dependent right lower lobe, measuring 0.6 cm (series 5, image 76). Subpleural radiation fibrosis of the anterior left upper lobe and lingula (series 5, image 62). No pleural effusion or pneumothorax. Musculoskeletal: No chest wall mass or suspicious osseous lesions identified. Status post left mastectomy and left axillary lymph node dissection. CT ABDOMEN PELVIS FINDINGS Hepatobiliary: There is a new hypoenhancing lesion of the liver dome, hepatic segment VIII, measuring 3.2 x 2.8 cm (series 12, image 16). No gallstones, gallbladder wall thickening, or biliary dilatation. Pancreas: Unremarkable. No pancreatic ductal dilatation or surrounding inflammatory changes. Spleen: Normal in size without significant abnormality. Adrenals/Urinary Tract: Stable, benign adenomatous thickening of the bilateral adrenal glands. Unchanged cystic lesion of the superior pole left kidney, which measures 2.1 cm and demonstrates intrinsic attenuation minimally above water (HU = 25), with no evidence of post-contrast enhancement (HU = 27). The right kidney is normal, without renal calculi, solid lesion, or hydronephrosis. Bladder is unremarkable. Stomach/Bowel: Stomach is within normal limits. Appendix appears normal. No evidence of bowel wall thickening, distention, or inflammatory changes. Large burden of stool throughout the colon and rectum. Vascular/Lymphatic: Aortic atherosclerosis. No enlarged abdominal or pelvic lymph nodes. Reproductive: Status post hysterectomy. Other: No abdominal wall hernia or abnormality. No ascites. Musculoskeletal: No acute osseous findings. IMPRESSION: 1. New hypoenhancing lesion of the liver dome, consistent with hepatic metastasis. 2.  Unchanged, minimally proteinaceous or hemorrhagic cyst of the superior pole of left kidney, without evidence of solid component or contrast enhancement. No further follow-up or characterization is specifically required for this benign lesion. 3.  Unchanged, somewhat ill-defined ground-glass opacity of the dependent right lower lobe, measuring 0.6 cm, nonspecific and infectious or inflammatory. No specific further follow-up or characterization is required. 4. Status post left mastectomy and left axillary lymph node dissection with radiation fibrosis of the left upper lobe. 5. Coronary artery disease. Aortic Atherosclerosis (ICD10-I70.0). Electronically Signed   By: Delanna Ahmadi M.D.   On: 12/08/2021 19:44   DG Chest 2 View  Result Date: 11/30/2021 CLINICAL DATA:  Left anterior chest pain. History of left-sided breast cancer. EXAM: CHEST - 2 VIEW COMPARISON:  Chest x-ray 02/17/2021. FINDINGS: Right-sided pacemaker is unchanged. Cardiomediastinal silhouette is within normal limits. There is no focal lung infiltrate, pleural effusion or pneumothorax. There are healed right second, third and fourth rib fractures similar to the prior exam. No definite acute fractures are seen. Left axillary surgical clips are present. IMPRESSION: No active cardiopulmonary disease. Electronically Signed   By: Ronney Asters M.D.   On: 11/30/2021 22:04   CT Chest Wo Contrast  Result Date: 12/08/2021 CLINICAL DATA:  Renal lesion, left upper pole renal lesion, lung nodule, history of left breast cancer, status post chemotherapy and XRT EXAM: CT CHEST WITHOUT CONTRAST CT ABDOMEN AND PELVIS WITH AND WITHOUT CONTRAST TECHNIQUE: Multidetector CT imaging of the chest was performed without intravenous contrast administration. Multidetector CT imaging of the abdomen and pelvis was performed following the standard protocol before and during bolus administration of intravenous contrast. RADIATION DOSE REDUCTION: This exam was performed  according to the departmental dose-optimization program which includes automated exposure control, adjustment of the mA and/or kV according to patient size and/or use of iterative reconstruction technique. CONTRAST:  184mL OMNIPAQUE IOHEXOL 300 MG/ML SOLN, additional oral enteric contrast COMPARISON:  CT abdomen pelvis, 05/31/2021, CT chest abdomen pelvis, 05/10/2021 FINDINGS: CT CHEST FINDINGS Cardiovascular: Right chest pacer defibrillator. Aortic atherosclerosis. Normal heart size. Three-vessel coronary artery calcifications. No pericardial effusion. Mediastinum/Nodes: No enlarged mediastinal, hilar, or axillary lymph nodes. Small hiatal hernia. Thyroid gland, trachea, and esophagus demonstrate no significant findings. Lungs/Pleura: Unchanged, somewhat ill-defined ground-glass opacity of the dependent right lower lobe, measuring 0.6 cm (series 5, image 76). Subpleural radiation fibrosis of the anterior left upper lobe and lingula (series 5, image 62). No pleural effusion or pneumothorax. Musculoskeletal: No chest wall mass or suspicious osseous lesions identified. Status post left mastectomy and left axillary lymph node dissection. CT ABDOMEN PELVIS FINDINGS Hepatobiliary: There is a new hypoenhancing lesion of the liver dome, hepatic segment VIII, measuring 3.2 x 2.8 cm (series 12, image 16). No gallstones, gallbladder wall thickening, or biliary dilatation. Pancreas: Unremarkable. No pancreatic ductal dilatation or surrounding inflammatory changes. Spleen: Normal in size without significant abnormality. Adrenals/Urinary Tract: Stable, benign adenomatous thickening of the bilateral adrenal glands. Unchanged cystic lesion of the superior pole left kidney, which measures 2.1 cm and demonstrates intrinsic attenuation minimally above water (HU = 25), with no evidence of post-contrast enhancement (HU = 27). The right kidney is normal, without renal calculi, solid lesion, or hydronephrosis. Bladder is unremarkable.  Stomach/Bowel: Stomach is within normal limits. Appendix appears normal. No evidence of bowel wall thickening, distention, or inflammatory changes. Large burden of stool throughout the colon and rectum. Vascular/Lymphatic: Aortic atherosclerosis. No enlarged abdominal or pelvic lymph nodes. Reproductive: Status post hysterectomy. Other: No abdominal wall hernia or abnormality. No ascites. Musculoskeletal: No acute osseous findings. IMPRESSION: 1. New hypoenhancing lesion of the liver dome, consistent with hepatic metastasis. 2. Unchanged, minimally proteinaceous or hemorrhagic cyst of the superior pole of left kidney, without  evidence of solid component or contrast enhancement. No further follow-up or characterization is specifically required for this benign lesion. 3. Unchanged, somewhat ill-defined ground-glass opacity of the dependent right lower lobe, measuring 0.6 cm, nonspecific and infectious or inflammatory. No specific further follow-up or characterization is required. 4. Status post left mastectomy and left axillary lymph node dissection with radiation fibrosis of the left upper lobe. 5. Coronary artery disease. Aortic Atherosclerosis (ICD10-I70.0). Electronically Signed   By: Delanna Ahmadi M.D.   On: 12/08/2021 19:44    Labs:  CBC: Recent Labs    12/07/21 1254 12/26/21 0630  WBC 6.8 5.2  HGB 13.4 13.4  HCT 39.5 41.8  PLT 265 249    COAGS: No results for input(s): INR, APTT in the last 8760 hours.  BMP: Recent Labs    05/10/21 1345 12/07/21 1254  NA  --  128*  K  --  4.8  CL  --  98  CO2  --  19*  GLUCOSE  --  272*  BUN  --  11  CALCIUM  --  10.0  CREATININE 0.60 0.88  GFRNONAA  --  >60    LIVER FUNCTION TESTS: Recent Labs    12/07/21 1254  BILITOT 0.3  AST 16  ALT 9  ALKPHOS 95  PROT 7.8  ALBUMIN 4.5    TUMOR MARKERS: No results for input(s): AFPTM, CEA, CA199, CHROMGRNA in the last 8760 hours.  Assessment and Plan:  History of breast cancer; new liver  lesion concerning for metastatic disease: Colleen Lowery, 81 year old female, presents today to the [facility] department for an image-guided liver lesion biopsy.   Risks and benefits of this procedure were discussed with the patient and/or patient's family including, but not limited to bleeding, infection, damage to adjacent structures or low yield requiring additional tests.  All of the questions were answered and there is agreement to proceed. She has been NPO. She does not take any blood thinning medications.   Consent signed and in chart.  Thank you for this interesting consult.  I greatly enjoyed meeting Colleen Lowery and look forward to participating in their care.  A copy of this report was sent to the requesting provider on this date.  Electronically Signed: Soyla Dryer, AGACNP-BC [phone] 12/26/2021, 7:27 AM   I spent a total of  30 Minutes   in face to face in clinical consultation, greater than 50% of which was counseling/coordinating care for liver lesion biopsy

## 2021-12-26 NOTE — Progress Notes (Signed)
Patient was given discharge instructions,she verbalized understanding.

## 2021-12-30 LAB — SURGICAL PATHOLOGY

## 2022-01-02 NOTE — Progress Notes (Signed)
Patient Care Team: Dortha Kern, MD as PCP - General (Family Medicine) Dorothy Puffer, MD as Consulting Physician (Radiation Oncology) Serena Croissant, MD as Consulting Physician (Hematology and Oncology) Harriette Bouillon, MD as Consulting Physician (General Surgery)  DIAGNOSIS:    ICD-10-CM   1. Malignant neoplasm of upper-outer quadrant of left breast in female, estrogen receptor positive (HCC)  C50.412    Z17.0       SUMMARY OF ONCOLOGIC HISTORY: Oncology History  Malignant neoplasm of upper-outer quadrant of left breast in female, estrogen receptor positive (HCC)  03/22/2020 Initial Diagnosis   Diffuse left breast swelling. skin thickening and two areas of irregular hypoechogenicity in the 2-3 o'clock region, 4.9cm, and at least 4 abnormal lymph nodes  with cortical thickening. 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 Administration: 0.25 mg (04/19/2020), 0.25 mg (05/10/2020), 0.25 mg (05/31/2020), 0.25 mg (06/21/2020), 0.25 mg (07/12/2020), 0.25 mg (08/02/2020)  methotrexate (PF) chemo injection 50 mg, 29.8 mg/m2 = 50.5 mg (100 % of original dose 30 mg/m2), Intravenous,  Once, 6 of 6 cycles. Dose modification: 30 mg/m2 (original dose 30 mg/m2, Cycle 1, Reason: Provider Judgment), 25 mg/m2 (original dose 30 mg/m2, Cycle 3, Reason: Provider Judgment). Administration: 50 mg (04/19/2020), 50 mg (05/10/2020), 42 mg (05/31/2020), 42 mg (06/21/2020), 42 mg (07/12/2020), 42 mg (08/02/2020)  cyclophosphamide (CYTOXAN) 840 mg in sodium chloride 0.9 % 250 mL chemo infusion, 500 mg/m2 = 840 mg (100 % of original dose 500 mg/m2), Intravenous,  Once, 6 of 6  cycles Dose modification: 500 mg/m2 (original dose 500 mg/m2, Cycle 1, Reason: Provider Judgment), 400 mg/m2 (original dose 500 mg/m2, Cycle 3, Reason: Provider Judgment). Administration: 840 mg (04/19/2020), 840 mg (05/10/2020), 680 mg (05/31/2020), 680 mg (06/21/2020), 680 mg (07/12/2020), 680 mg (08/02/2020)  fluorouracil (ADRUCIL) chemo injection 850 mg, 500 mg/m2 = 850 mg (100 % of original dose 500 mg/m2), Intravenous,  Once, 6 of 6 cycles. Dose modification: 500 mg/m2 (original dose 500 mg/m2, Cycle 1, Reason: Provider Judgment). Administration: 850 mg (04/19/2020), 850 mg (05/10/2020), 850 mg (05/31/2020), 850 mg (06/21/2020), 850 mg (07/12/2020), 850 mg (08/02/2020)   09/14/2020 Surgery   Left mastectomy ([name]) ([id number]): invasive and in situ ductal carcinoma, grade 2, 15cm, clear margins, with 14/14 lymph nodes positive for metastatic carcinoma with extracapsular extension.    10/28/2020 - 12/17/2020 Radiation Therapy   The patient initially received a dose of 50.4 Gy in 28 fractions to the left breast and supraclavicular region using whole-breast tangent fields. This was delivered using a 3-D conformal technique. The pt received a boost delivering an additional 10 Gy in 5 fractions using a electron boost with electrons. The total dose was 60.4 Gy.   11/2020 - 11/2027 Anti-estrogen oral therapy   Anastrozole     CHIEF COMPLIANT: Follow-up of left breast cancer  INTERVAL HISTORY: Colleen Lowery is a 81 y.o. with above-mentioned history of left breast cancer who underwent neoadjuvant chemotherapy, left mastectomy, and radiation therapy. She presents to the clinic today for follow-up.  She underwent a liver biopsy and is here today to discuss the results of the biopsy.  She  has had some discomfort from the biopsy.  ALLERGIES:  is allergic to alcohol, contrast media [iodinated contrast media], atorvastatin, dust mite extract, latex, lisinopril, and molds & smuts.  MEDICATIONS:  Current  Outpatient Medications  Medication Sig Dispense Refill   ADRENAL CORTEX PO Take 4 capsules by mouth daily.     alendronate (FOSAMAX) 70 MG tablet Take 1 tablet (70 mg total) by mouth once a week. Take with a full glass of water on an empty stomach. (Patient taking differently: Take 70 mg by mouth every Sunday. Take with a full glass of water on an empty stomach.) 12 tablet 3   anastrozole (ARIMIDEX) 1 MG tablet Take 1 tablet (1 mg total) by mouth daily. 90 tablet 3   Ascorbic Acid (VITAMIN C PO) Take 2,000 mg by mouth daily.     B Complex-Folic Acid (B COMPLEX PLUS PO) Take 4 tablets by mouth daily.     Beta Carotene (VITAMIN A) 25000 UNIT capsule Take 50,000 Units by mouth daily.     CALCIUM PO Take 10 mLs by mouth daily. Liquid 300 MG /5 ML     Cholecalciferol (VITAMIN D3) 5000 units CAPS Take 5,000 Units by mouth daily. 2500 units per cap     Flaxseed, Linseed, (FLAXSEED OIL) 1000 MG CAPS Take 3,000 mg by mouth daily.     furosemide (LASIX) 20 MG tablet Take 20 mg by mouth daily.     Homeopathic Products (ARNICARE PAIN RELIEF EX) Apply 1 application topically daily as needed (pain).     losartan (COZAAR) 50 MG tablet Take 50 mg by mouth daily.     MAGNESIUM PO Take 5 mLs by mouth daily. Liquid 2.5 ml/150 mg     methocarbamol (ROBAXIN) 500 MG tablet Take 1 tablet (500 mg total) by mouth every 6 (six) hours as needed for muscle spasms. 20 tablet 0   Misc Natural Products (BLOOD SUGAR BALANCE PO) Take 3 capsules by mouth daily. Blood Sugar Manager     OVER THE COUNTER MEDICATION Take 1 Scoop by mouth daily. Branched chain amino acids 4000     OVER THE COUNTER MEDICATION Take 400-800 mg by mouth daily as needed (Based on Blood glucose). gymnema sylvestre     OVER THE COUNTER MEDICATION Take 5 capsules by mouth daily. Pau d' arco 500 mg each     OVER THE COUNTER MEDICATION Take 6 capsules by mouth daily. Caprylic acid     OVER THE COUNTER MEDICATION Take 5 capsules by mouth daily. Pancreatic  enzymes     OVER THE COUNTER MEDICATION Take 3 capsules by mouth daily. Raw Adrenal     OVER THE COUNTER MEDICATION Take 750 mg by mouth daily. Gaba Supplement     OVER THE COUNTER MEDICATION Take 6 capsules by mouth daily. Beef Pancreas     PANTETHINE PO Take 1-2 tablets by mouth daily as needed (Itching). Vitamin B5     potassium chloride SA (K-DUR,KLOR-CON) 20 MEQ tablet Take 20 mEq by mouth 2 (two) times daily.     predniSONE (DELTASONE) 50 MG tablet Take 1 tab 13, 7, and 1 hour prior to CT scan.  Take benadryl $RemoveBefor'50mg'sKCxxhtbdJhm$  oral 1 hour prior to CT scan (Patient not taking: Reported on 12/21/2021) 3 tablet 0   Propylene Glycol (SYSTANE BALANCE) 0.6 % SOLN Place 2 drops into the right eye in the morning and at bedtime.     No current facility-administered medications for this visit.    PHYSICAL EXAMINATION: ECOG PERFORMANCE STATUS: 1 -  Symptomatic but completely ambulatory  Vitals:   01/03/22 1137  BP: (!) 184/88  Pulse: 71  Resp: 18  Temp: (!) 97.3 F (36.3 C)  SpO2: 100%   Filed Weights   01/03/22 1137  Weight: 146 lb 4.8 oz (66.4 kg)    LABORATORY DATA:  I have reviewed the data as listed CMP Latest Ref Rng & Units 12/07/2021 05/10/2021 09/15/2020  Glucose 70 - 99 mg/dL 272(H) - 126(H)  BUN 8 - 23 mg/dL 11 - 13  Creatinine 0.44 - 1.00 mg/dL 0.88 0.60 0.84  Sodium 135 - 145 mmol/L 128(L) - 138  Potassium 3.5 - 5.1 mmol/L 4.8 - 4.7  Chloride 98 - 111 mmol/L 98 - 106  CO2 22 - 32 mmol/L 19(L) - 23  Calcium 8.9 - 10.3 mg/dL 10.0 - 8.8(L)  Total Protein 6.5 - 8.1 g/dL 7.8 - 5.8(L)  Total Bilirubin 0.3 - 1.2 mg/dL 0.3 - 0.6  Alkaline Phos 38 - 126 U/L 95 - 85  AST 15 - 41 U/L 16 - 18  ALT 0 - 44 U/L 9 - 10    Lab Results  Component Value Date   WBC 5.2 12/26/2021   HGB 13.4 12/26/2021   HCT 41.8 12/26/2021   MCV 92.7 12/26/2021   PLT 249 12/26/2021   NEUTROABS 6.0 12/07/2021    ASSESSMENT & PLAN:  Malignant neoplasm of upper-outer quadrant of left breast in female,  estrogen receptor positive ([place]) 03/22/2020:diffuse left breast swelling. skin thickening and two areas of irregular hypoechogenicity in the 2-3 o'clock region, 4.9cm, and at least 4 abnormal lymph nodes  with cortical thickening. 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  Neoadjuvant chemotherapy with CMF 2.  09/14/2020:Left mastectomy ([name]): invasive and in situ ductal carcinoma, grade 2, 15cm, clear margins, with 14/14 lymph nodes positive for metastatic carcinoma with extracapsular extension.  ER 30%, PR 10%, HER-2 negative, Ki-67 15% 3.  Adjuvant radiation 10/28/2020-12/17/2020 4.  Followed by adjuvant antiestrogen therapy with anastrozole started 01/11/2021 -------------------------------------------------------------------------------------------------------------------------------------------    CT chest abdomen pelvis: 12/07/2021: New enhancing lesion liver dome 3.2 centimeters, left kidney hemorrhagic cyst (likely benign), unchanged groundglass opacity right lower lobe 0.6 cm (nonspecific)   Liver biopsy 12/26/2021: Consistent with metastatic breast cancer (CK7 positive, CK20 positive, GA TA-3 positive) ER 0%, PR 0%, HER2 1+ by IHC (negative)  I discussed with the patient that 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  This does not respond to antiestrogen therapy and therefore we will need to treat her with systemic chemotherapy and I recommended treatment with capecitabine.  She has peripheral neuropathy and therefore we decided not to do Abraxane or Taxol.  Recommended dose: 1000 g p.o. twice daily Discussed risks and benefits of capecitabine.  The risk of diarrhea and hand-foot syndrome along with LFT changes and blood count changes.  Return to clinic in 3  weeks to see Jenny Reichmann to assess tolerability to Xeloda.    No orders of the defined types were placed in this encounter.  The patient has a good understanding of the overall plan. she agrees with it. she will call with any problems that may develop before the next visit here.  Total time spent: 45 mins including face to face time and time spent for planning, charting and coordination  of care  Rulon Eisenmenger, MD, MPH 01/03/2022  I, Thana Ates, am acting as scribe for Dr. Nicholas Lose.  I have reviewed the above documentation for accuracy and completeness, and I agree with the above.

## 2022-01-03 ENCOUNTER — Telehealth: Payer: Self-pay

## 2022-01-03 ENCOUNTER — Other Ambulatory Visit: Payer: Self-pay

## 2022-01-03 ENCOUNTER — Other Ambulatory Visit (HOSPITAL_COMMUNITY): Payer: Self-pay

## 2022-01-03 ENCOUNTER — Encounter: Payer: Self-pay | Admitting: Hematology and Oncology

## 2022-01-03 ENCOUNTER — Inpatient Hospital Stay: Payer: Medicare HMO | Attending: Hematology and Oncology | Admitting: Hematology and Oncology

## 2022-01-03 DIAGNOSIS — Z170 Estrogen receptor positive status [ER+]: Secondary | ICD-10-CM

## 2022-01-03 DIAGNOSIS — Z79899 Other long term (current) drug therapy: Secondary | ICD-10-CM | POA: Insufficient documentation

## 2022-01-03 DIAGNOSIS — Z79811 Long term (current) use of aromatase inhibitors: Secondary | ICD-10-CM | POA: Diagnosis not present

## 2022-01-03 DIAGNOSIS — C50412 Malignant neoplasm of upper-outer quadrant of left female breast: Secondary | ICD-10-CM | POA: Diagnosis not present

## 2022-01-03 MED ORDER — CAPECITABINE 500 MG PO TABS
1000.0000 mg | ORAL_TABLET | Freq: Two times a day (BID) | ORAL | 6 refills | Status: DC
  Filled 2022-01-03: qty 56, 14d supply, fill #0
  Filled 2022-01-10: qty 56, 21d supply, fill #0
  Filled 2022-01-24: qty 56, 21d supply, fill #1
  Filled 2022-02-16: qty 56, 21d supply, fill #2

## 2022-01-03 NOTE — Telephone Encounter (Signed)
Oral Oncology Patient Advocate Encounter  Prior Authorization for Xeloda has been approved.    PA# [id number] Effective dates: 01/03/22 through 11/12/22  Patients co-pay is $19.27  Oral Oncology Clinic will continue to follow.   [place] Patient [place] Phone [phone] Fax [phone] 01/03/2022 2:20 PM

## 2022-01-03 NOTE — Telephone Encounter (Signed)
Oral Oncology Pharmacist Encounter  Received new prescription for capecitabine (Xeloda) for the treatment of metastatic triple negative breast cancer, planned duration until disease progression or unacceptable toxicity.  Labs from 12/07/21 (CMP) and 12/26/21 (CBC) assessed, no interventions needed.  Current medication list in Epic reviewed, DDIs with xeloda identified:  -calcium: antacids increase the concentration of xeloda (cat B)  Evaluated chart and no patient barriers to medication adherence noted.   Patient agreement for treatment documented in MD note on 01/03/2022.  Prescription has been e-scribed to the [facility] for benefits analysis and approval.  Oral Oncology Clinic will continue to follow for insurance authorization, copayment issues, initial counseling and start date.  Drema Halon, PharmD Hematology/Oncology Clinical Pharmacist [facility] [phone] 01/03/2022 12:11 PM

## 2022-01-03 NOTE — Telephone Encounter (Signed)
Oral Oncology Patient Advocate Encounter   Received notification from [facility] that prior authorization for Xeloda is required.   PA submitted on CoverMyMeds Key [id number] Status is pending   Oral Oncology Clinic will continue to follow.  [place] Patient [place] Phone [phone] Fax [phone] 01/03/2022 12:26 PM

## 2022-01-03 NOTE — Assessment & Plan Note (Signed)
03/22/2020:diffuse left breast swelling. skin thickening and two areas of irregular hypoechogenicity in the 2-3 o'clock region, 4.9cm, and at least 4 abnormal lymph nodes with cortical thickening. 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, grade 2, HER-2 negative (1+), ER+ 30%, PR+ 10%, Ki67 15% T4BN1 stage IIIb clinical stage Skin invasion versus inflammatory breast cancer  Treatment plan: 1.Neoadjuvant chemotherapywith CMF 2.09/14/2020:Left mastectomy ([name]): invasive and in situ ductal carcinoma, grade 2, 15cm, clear margins, with 14/14 lymph nodes positive for metastatic carcinoma with extracapsular extension.ER 30%, PR 10%, HER-2 negative, Ki-67 15% 3.Adjuvant radiation 10/28/2020-12/17/2020 4.Followed by adjuvant antiestrogen therapy with anastrozole started 01/11/2021 -------------------------------------------------------------------------------------------------------------------------------------------   CT chest abdomen pelvis: 12/07/2021: New enhancing lesion liver dome 3.2 centimeters, left kidney hemorrhagic cyst (likely benign), unchanged groundglass opacity right lower lobe 0.6 cm (nonspecific)  Liver biopsy 12/26/2021: Consistent with metastatic breast cancer (CK7 positive, CK20 positive, GA TA-3 positive) ER 0%, PR 0%, HER2 1+ by IHC (negative)  I discussed with the patient that 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  This does not respond to antiestrogen therapy and therefore we will need to treat her with systemic chemotherapy and I recommended treatment with Abraxane.  Return to clinic to start chemo

## 2022-01-04 NOTE — Telephone Encounter (Signed)
Oral Chemotherapy Pharmacist Encounter  Attempted to call patient to discuss new oral chemotherapy medication, Xeloda. Voicemail not set up at this time.   Oral chemotherapy clinic will continue to follow.  Drema Halon, PharmD Hematology/Oncology Clinical Pharmacist [facility] [phone]

## 2022-01-10 ENCOUNTER — Other Ambulatory Visit (HOSPITAL_COMMUNITY): Payer: Self-pay

## 2022-01-10 NOTE — Telephone Encounter (Signed)
Oral Chemotherapy Pharmacist Encounter  I spoke with patient for overview of: Xeloda (capecitabine) for the  treatment of metastatic triple negative breast cancer, planned duration until disease progression or unacceptable drug toxicity.  Counseled patient on administration, dosing, side effects, monitoring, drug-food interactions, safe handling, storage, and disposal.  Patient will take Xeloda 500mg  tablets, 2 tablets (1000mg ) by mouth in AM and 2 tabs (1000mg ) by mouth in PM, within 30 minutes of finishing meals, for 14 days on, 7 days off, repeated every 21 days.  Xeloda start date: 01/12/2022  Adverse effects include but are not limited to: fatigue, decreased blood counts, GI upset, diarrhea, mouth sores, and hand-foot syndrome.  Patient has anti-emetic on hand and knows to take it if nausea develops.   Patient will obtain anti diarrheal and alert the office of 4 or more loose stools above baseline.  Reviewed with patient importance of keeping a medication schedule and plan for any missed doses. No barriers to medication adherence identified.  Medication reconciliation performed and medication/allergy list updated.  This will ship from the [facility] on 01/10/2022 to deliver to patient's home on 01/11/2022.  Patient informed the pharmacy will reach out 5-7 days prior to needing next fill of Xeloda to coordinate continued medication acquisition to prevent break in therapy.  All questions answered.  Mrs. Burkland voiced understanding and appreciation.   Medication education handout placed in mail for patient. Patient knows to call the office with questions or concerns. Oral Chemotherapy Clinic phone number provided to patient.   Drema Halon, PharmD Hematology/Oncology Clinical Pharmacist [facility] [phone]

## 2022-01-12 ENCOUNTER — Encounter: Payer: Self-pay | Admitting: *Deleted

## 2022-01-12 NOTE — Progress Notes (Signed)
Per MD request, RN successfully faxed order to [facility].  ?

## 2022-01-23 ENCOUNTER — Other Ambulatory Visit: Payer: Self-pay | Admitting: *Deleted

## 2022-01-23 ENCOUNTER — Encounter: Payer: Self-pay | Admitting: *Deleted

## 2022-01-23 DIAGNOSIS — Z170 Estrogen receptor positive status [ER+]: Secondary | ICD-10-CM

## 2022-01-23 DIAGNOSIS — C50412 Malignant neoplasm of upper-outer quadrant of left female breast: Secondary | ICD-10-CM

## 2022-01-23 NOTE — Progress Notes (Signed)
Received message from [facility] stating pt biopsy block does not have enough adequate tissue to proceed with [facility] testing.  Per MD pt needing to undergo [facility] lab testing.  Orders placed.  RN attempt x1 to contact pt, no answer.  LVM for pt to return call to the office.  ?

## 2022-01-24 ENCOUNTER — Inpatient Hospital Stay: Payer: Medicare HMO | Attending: Hematology and Oncology

## 2022-01-24 ENCOUNTER — Inpatient Hospital Stay: Payer: Medicare HMO | Admitting: Pharmacist

## 2022-01-24 ENCOUNTER — Other Ambulatory Visit: Payer: Self-pay

## 2022-01-24 ENCOUNTER — Encounter: Payer: Self-pay | Admitting: Hematology and Oncology

## 2022-01-24 ENCOUNTER — Other Ambulatory Visit (HOSPITAL_COMMUNITY): Payer: Self-pay

## 2022-01-24 VITALS — BP 177/101 | HR 84 | Temp 97.7°F | Resp 18 | Ht 62.5 in | Wt 150.5 lb

## 2022-01-24 DIAGNOSIS — M810 Age-related osteoporosis without current pathological fracture: Secondary | ICD-10-CM

## 2022-01-24 DIAGNOSIS — C50412 Malignant neoplasm of upper-outer quadrant of left female breast: Secondary | ICD-10-CM

## 2022-01-24 DIAGNOSIS — Z170 Estrogen receptor positive status [ER+]: Secondary | ICD-10-CM | POA: Diagnosis not present

## 2022-01-24 LAB — CBC WITH DIFFERENTIAL (CANCER CENTER ONLY)
Abs Immature Granulocytes: 0.04 10*3/uL (ref 0.00–0.07)
Basophils Absolute: 0 10*3/uL (ref 0.0–0.1)
Basophils Relative: 1 %
Eosinophils Absolute: 0 10*3/uL (ref 0.0–0.5)
Eosinophils Relative: 1 %
HCT: 40.6 % (ref 36.0–46.0)
Hemoglobin: 13.4 g/dL (ref 12.0–15.0)
Immature Granulocytes: 1 %
Lymphocytes Relative: 21 %
Lymphs Abs: 1.2 10*3/uL (ref 0.7–4.0)
MCH: 30.1 pg (ref 26.0–34.0)
MCHC: 33 g/dL (ref 30.0–36.0)
MCV: 91.2 fL (ref 80.0–100.0)
Monocytes Absolute: 0.4 10*3/uL (ref 0.1–1.0)
Monocytes Relative: 8 %
Neutro Abs: 3.9 10*3/uL (ref 1.7–7.7)
Neutrophils Relative %: 68 %
Platelet Count: 213 10*3/uL (ref 150–400)
RBC: 4.45 MIL/uL (ref 3.87–5.11)
RDW: 14 % (ref 11.5–15.5)
WBC Count: 5.6 10*3/uL (ref 4.0–10.5)
nRBC: 0 % (ref 0.0–0.2)

## 2022-01-24 LAB — CMP (CANCER CENTER ONLY)
ALT: 9 U/L (ref 0–44)
AST: 14 U/L — ABNORMAL LOW (ref 15–41)
Albumin: 4.2 g/dL (ref 3.5–5.0)
Alkaline Phosphatase: 90 U/L (ref 38–126)
Anion gap: 8 (ref 5–15)
BUN: 15 mg/dL (ref 8–23)
CO2: 23 mmol/L (ref 22–32)
Calcium: 9.9 mg/dL (ref 8.9–10.3)
Chloride: 103 mmol/L (ref 98–111)
Creatinine: 0.97 mg/dL (ref 0.44–1.00)
GFR, Estimated: 60 mL/min — ABNORMAL LOW (ref >60)
Glucose, Bld: 327 mg/dL — ABNORMAL HIGH (ref 70–99)
Potassium: 3.9 mmol/L (ref 3.5–5.1)
Sodium: 134 mmol/L — ABNORMAL LOW (ref 135–145)
Total Bilirubin: 0.4 mg/dL (ref 0.3–1.2)
Total Protein: 6.9 g/dL (ref 6.5–8.1)

## 2022-01-24 NOTE — Progress Notes (Signed)
?[place]  ?     Telephone: [phone]?Fax: [phone]  ? ?Oncology Clinical Pharmacist Practitioner Initial Assessment ? ?Colleen Lowery is a 81 y.o. female with a diagnosis of metastatic breast cancer. They were contacted today via in-person visit. ? ?Indication/Regimen ?Capecitabine (Xeloda?) is being used appropriately for treatment of metastatic breast cancer by Dr. Nicholas Lose.  ?  ?  ?Wt Readings from Last 1 Encounters:  ?01/24/22 150 lb 8 oz (68.3 kg)  ? ? Estimated body surface area is 1.74 meters squared as calculated from the following: ?  Height as of this encounter: 5' 2.5" (1.588 m). ?  Weight as of this encounter: 150 lb 8 oz (68.3 kg). ? ?The dosing regimen is 2 tablets (1000 mg) by mouth in the morning and 2 tablets (1000 mg) in the evening on days 1 to 14 of a 21-day cycle. . This is being given  monotherapy. It is planned to continue until disease progression or unacceptable toxicity.  Colleen Lowery was seen today in pharmacy clinic after being referred by Dr. Lindi Adie for her management of capecitabine.  She states that she started this on March 2 and she is reporting no side effects at this time.  We went over how to take this medication spacing out the doses by 12 hours and taking the medication 14 days on, followed by a 7-day rest period.  During our discussion, Ms. Schiavo states that she was taking her capecitabine at 11 AM and around 8 PM.  She knows going forward to space out the doses 12 hours apart.  We discussed potential side effects of capecitabine which include but are not limited to diarrhea, cardiotoxicity, dehydration, renal failure, nausea, vomiting,palmar-plantar erythrodysesthesia, stomatitis, fever, neutropenia, alopecia, and fatigue.  We also went over that manufacturing guidelines recommend taking within 30 minutes of a meal.  We also discussed what to do if doses are missed. ? 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  She states that she was in the  initial [employer] class some years ago.  We reviewed her medications and discussed her several herbal supplements that she takes.  This was documented in her medication profile per her report as to why she takes some of these herbal supplements.  We went over that herbal supplements are not regulated by the FDA and it is hard to guarantee the purity of these agents.  We also discussed that they may interact with her current medications, including capecitabine, making the capecitabine work too well or not well enough.  We discussed that since these herbal supplements are not well studied, it is hard to know for certain whether drug to drug interactions exist.  She states that she has been on these herbal supplements for a long time and understands the risks going forward and accepts them.  We did review some potential side effects of these herbal supplements that have been discussed in the literature and she knows to be on the look out for these potential side effects including but not limited to anemia, asthma, dizziness, rash, headache, fatigue, increased risk for infection, muscle weakness, anxiety, and elevated blood pressure.[name]  Her blood pressure is elevated today coming into clinic.  She states that this is normal when she comes into the cancer center.  She does state that her hypertension is managed by Dr. Lavera Guise, who is her primary care physician.  She last saw Dr. Clemmie Krill on March 7 and will next see her again in 6 months.  Ms. Colleen Lowery states that she does manage her blood pressure at home and takes readings which range from systolic in the 809X and diastolic in the 83-38 range.  She states overall she is feeling good and not experiencing any headaches blurred vision or any other concerning symptoms.  She continues to take alendronate for osteoporosis.  Her last DEXA scan is reported to be February 9 of last year.  We will order an updated DEXA. ? ?She will plan on having labs and seeing clinical pharmacy on  April 10.  She is planning to tentatively start cycle 3 of capecitabine on April 13.  She will then have labs and see Dr. Lindi Adie tentatively on May 1. ? ?Dose Modifications ?Capecitabine (~575 mg/m2 BID 14/21) ? ?Access Assessment ?KYANNE RIALS will be receiving capecitabine through [facility] ?Insurance Concerns: none ?Start date if known: 01/12/22, cycle 2 to start tentatively on 02/02/22 ? ?Allergies ?Allergies  ?Allergen Reactions  ? Alcohol Anaphylaxis and Swelling  ?  Purell hand sanitizer ? ?Swelling in the luNGS, CHEST AND THROAT and seizures ?Problem with smell not the Alcohol ?Other reaction(s): Other (See Comments) ?Causes seizures and swelling ?  ? Contrast Media [Iodinated Contrast Media] Anaphylaxis  ?  Throat closed, was 40 yrs ago ? ?*Iodine   ? Atorvastatin   ?  Myalgia   ? Dust Mite Extract Swelling  ?  SWELLING IS GI SYSTEM  ? Latex   ?  QUESTIONABLE INTOLERABLE MUCOUS OOZING OUT OF THROAT  ? Lisinopril Other (See Comments)  ?  Pt does not remember reaction   ? Molds & Smuts Swelling  ?  SWELLING IS IN GI SYSTEM  ? ? ?Vitals ?Vitals with BMI 01/24/2022 01/03/2022 12/26/2021  ?Height 5' 2.5" 5' 2.5" -  ?Weight 150 lbs 8 oz 146 lbs 5 oz -  ?BMI 27.07 26.32 -  ?Systolic 250 539 767  ?Diastolic 341 88 90  ?Pulse 84 71 92  ?  ? ?Laboratory Data ?CBC EXTENDED Latest Ref Rng & Units 01/24/2022 12/26/2021 12/07/2021  ?WBC 4.0 - 10.5 K/uL 5.6 5.2 6.8  ?RBC 3.87 - 5.11 MIL/uL 4.45 4.51 4.48  ?HGB 12.0 - 15.0 g/dL 13.4 13.4 13.4  ?HCT 36.0 - 46.0 % 40.6 41.8 39.5  ?PLT 150 - 400 K/uL 213 249 265  ?NEUTROABS 1.7 - 7.7 K/uL 3.9 - 6.0  ?LYMPHSABS 0.7 - 4.0 K/uL 1.2 - 0.5(L)  ?  ?CMP Latest Ref Rng & Units 01/24/2022 12/07/2021 05/10/2021  ?Glucose 70 - 99 mg/dL 327(H) 272(H) -  ?BUN 8 - 23 mg/dL 15 11 -  ?Creatinine 0.44 - 1.00 mg/dL 0.97 0.88 0.60  ?Sodium 135 - 145 mmol/L 134(L) 128(L) -  ?Potassium 3.5 - 5.1 mmol/L 3.9 4.8 -  ?Chloride 98 - 111 mmol/L 103 98 -  ?CO2 22 - 32 mmol/L 23 19(L) -   ?Calcium 8.9 - 10.3 mg/dL 9.9 10.0 -  ?Total Protein 6.5 - 8.1 g/dL 6.9 7.8 -  ?Total Bilirubin 0.3 - 1.2 mg/dL 0.4 0.3 -  ?Alkaline Phos 38 - 126 U/L 90 95 -  ?AST 15 - 41 U/L 14(L) 16 -  ?ALT 0 - 44 U/L 9 9 -  ? ?Lab Results  ?Component Value Date  ? MG 1.9 05/09/2016  ? MG 1.9 02/23/2016  ? MG 1.9 02/07/2016  ?  ? ?Contraindications ?Contraindications were reviewed? Yes ?Contraindications to therapy were identified? No  ? ?Safety Precautions ?The following safety precautions for the use of capecitabine were reviewed:  ?  Fever: reviewed the importance of having a thermometer and the Centers for Disease Control and Prevention (CDC) definition of fever which is 100.4?F (38?C) or higher. Patient should call 24/7 triage at [phone] if experiencing a fever or any other symptoms ?Diarrhea ?Cardiotoxicity ?Dehydration and renal failure ?Nausea ?Vomiting ?Palmar-plantar erythrodysesthesia (Hand-Foot Syndrome) ?Stomatitis ?Neutropenia ?Alopecia ?Fatigue ?Storage and Handling ?To be taken within 30 minutes of a meal ?Missed doses ?As above, unable to accurately assess her many herbal supplements for drug-drug interactions. Patient understands risks and accepts them ? ?Medication Reconciliation ?Current Outpatient Medications  ?Medication Sig Dispense Refill  ? ADRENAL CORTEX PO Take 4 capsules by mouth daily.    ? alendronate (FOSAMAX) 70 MG tablet Take 1 tablet (70 mg total) by mouth once a week. Take with a full glass of water on an empty stomach. (Patient taking differently: Take 70 mg by mouth every Sunday. Take with a full glass of water on an empty stomach.) 12 tablet 3  ? Ascorbic Acid (VITAMIN C PO) Take 2,000 mg by mouth daily.    ? B Complex-Folic Acid (B COMPLEX PLUS PO) Take 4 tablets by mouth daily.    ? Beta Carotene (VITAMIN A) 25000 UNIT capsule Take 50,000 Units by mouth daily.    ? CALCIUM PO Take 10 mLs by mouth daily. Liquid ?300 MG /5 ML    ? capecitabine (XELODA) 500 MG tablet Take 2 tablets  (1,000 mg total) by mouth 2 (two) times daily after a meal. 14 days on and 7 days off 56 tablet 6  ? Cholecalciferol (VITAMIN D3) 5000 units CAPS Take 5,000 Units by mouth daily. 2500 units per cap    ? Flaxseed, Linseed

## 2022-01-25 ENCOUNTER — Telehealth: Payer: Self-pay | Admitting: Pharmacist

## 2022-01-25 ENCOUNTER — Encounter (HOSPITAL_COMMUNITY): Payer: Self-pay

## 2022-01-25 LAB — CANCER ANTIGEN 27.29: CA 27.29: 116.1 U/mL — ABNORMAL HIGH (ref 0.0–38.6)

## 2022-01-25 NOTE — Telephone Encounter (Signed)
Scheduled appointment per 3/14 los. Left message. Patient will be mailed an updated calendar. ?

## 2022-01-30 ENCOUNTER — Other Ambulatory Visit (HOSPITAL_COMMUNITY): Payer: Self-pay

## 2022-02-01 LAB — GUARDANT 360

## 2022-02-02 ENCOUNTER — Other Ambulatory Visit (HOSPITAL_COMMUNITY): Payer: Self-pay

## 2022-02-16 ENCOUNTER — Other Ambulatory Visit (HOSPITAL_COMMUNITY): Payer: Self-pay

## 2022-02-21 ENCOUNTER — Other Ambulatory Visit: Payer: Self-pay

## 2022-02-21 ENCOUNTER — Inpatient Hospital Stay: Payer: Medicare HMO | Attending: Hematology and Oncology

## 2022-02-21 ENCOUNTER — Other Ambulatory Visit (HOSPITAL_COMMUNITY): Payer: Self-pay

## 2022-02-21 ENCOUNTER — Inpatient Hospital Stay: Payer: Medicare HMO | Admitting: Pharmacist

## 2022-02-21 VITALS — BP 163/90 | HR 81 | Temp 97.9°F | Resp 18 | Ht 62.5 in | Wt 145.0 lb

## 2022-02-21 DIAGNOSIS — C50919 Malignant neoplasm of unspecified site of unspecified female breast: Secondary | ICD-10-CM

## 2022-02-21 DIAGNOSIS — C50412 Malignant neoplasm of upper-outer quadrant of left female breast: Secondary | ICD-10-CM | POA: Insufficient documentation

## 2022-02-21 DIAGNOSIS — Z170 Estrogen receptor positive status [ER+]: Secondary | ICD-10-CM | POA: Insufficient documentation

## 2022-02-21 DIAGNOSIS — Z79899 Other long term (current) drug therapy: Secondary | ICD-10-CM | POA: Diagnosis not present

## 2022-02-21 LAB — CBC WITH DIFFERENTIAL (CANCER CENTER ONLY)
Abs Immature Granulocytes: 0.08 10*3/uL — ABNORMAL HIGH (ref 0.00–0.07)
Basophils Absolute: 0 10*3/uL (ref 0.0–0.1)
Basophils Relative: 1 %
Eosinophils Absolute: 0 10*3/uL (ref 0.0–0.5)
Eosinophils Relative: 1 %
HCT: 40.4 % (ref 36.0–46.0)
Hemoglobin: 13.6 g/dL (ref 12.0–15.0)
Immature Granulocytes: 1 %
Lymphocytes Relative: 21 %
Lymphs Abs: 1.2 10*3/uL (ref 0.7–4.0)
MCH: 30.8 pg (ref 26.0–34.0)
MCHC: 33.7 g/dL (ref 30.0–36.0)
MCV: 91.6 fL (ref 80.0–100.0)
Monocytes Absolute: 0.7 10*3/uL (ref 0.1–1.0)
Monocytes Relative: 13 %
Neutro Abs: 3.6 10*3/uL (ref 1.7–7.7)
Neutrophils Relative %: 63 %
Platelet Count: 175 10*3/uL (ref 150–400)
RBC: 4.41 MIL/uL (ref 3.87–5.11)
RDW: 16.5 % — ABNORMAL HIGH (ref 11.5–15.5)
WBC Count: 5.6 10*3/uL (ref 4.0–10.5)
nRBC: 0 % (ref 0.0–0.2)

## 2022-02-21 LAB — CMP (CANCER CENTER ONLY)
ALT: 7 U/L (ref 0–44)
AST: 13 U/L — ABNORMAL LOW (ref 15–41)
Albumin: 4.1 g/dL (ref 3.5–5.0)
Alkaline Phosphatase: 107 U/L (ref 38–126)
Anion gap: 8 (ref 5–15)
BUN: 11 mg/dL (ref 8–23)
CO2: 23 mmol/L (ref 22–32)
Calcium: 9.4 mg/dL (ref 8.9–10.3)
Chloride: 105 mmol/L (ref 98–111)
Creatinine: 0.99 mg/dL (ref 0.44–1.00)
GFR, Estimated: 58 mL/min — ABNORMAL LOW (ref >60)
Glucose, Bld: 183 mg/dL — ABNORMAL HIGH (ref 70–99)
Potassium: 3.9 mmol/L (ref 3.5–5.1)
Sodium: 136 mmol/L (ref 135–145)
Total Bilirubin: 0.4 mg/dL (ref 0.3–1.2)
Total Protein: 7 g/dL (ref 6.5–8.1)

## 2022-02-21 MED ORDER — CAPECITABINE 500 MG PO TABS
ORAL_TABLET | ORAL | 0 refills | Status: DC
  Filled 2022-02-21: qty 70, fill #0

## 2022-02-21 NOTE — Progress Notes (Signed)
?[place]  ?     Telephone: [phone]?Fax: [phone]  ? ?Oncology Clinical Pharmacist Practitioner Progress Note ? ?Colleen Lowery was contacted via in person visit to discuss her chemotherapy regimen for capecitabine which they receive under the care of Dr. Dr. Lindi Adie.  ? ?Current treatment regimen and start date ?Capecitabine (01/12/22) ? ?Interval History ?She continues on capecitabine 2 tablets (1000 mg) in the morning and 2 tablets (1000 mg) in the evening on days 1 to 14 of a 21-day cycle. This is being given monotherapy. Therapy is planned to continue until disease progression or unacceptable toxicity.  ? ?Response to Therapy ?Colleen Lowery is doing well.  She last saw clinical pharmacy on March 14.  She last saw Dr. Lindi Adie on February 21.  She reports having no side effects that would be attributed to capecitabine at this time.  We again reviewed side effects to watch out for and she continues to monitor for these symptoms.  Her blood sugar was elevated today.  She states that she has this managed by her PCP who she sees every 6 months.  Colleen Lowery states that she has used herbal supplements extensively to kind to control her blood sugars because she does not feel that pharmaceuticals have worked in the past.  We discussed that she could consider some newer agents for this condition and she said that she would consider this the next time that she sees her PCP ? ?We discussed going up on her capecitabine dose to 1500 mg in the morning and 1000 mg in the evening since her current prescription was dosed at a BSA of 1.73 which equates to approximately 575 mg/m2 every 12 hours 14/21.  She has her current prescription of 1000 mg every 12 hours 14/21 at home which she will start on Thursday (02/23/22).  We will plan on sending a new prescription today for the 1500 mg in the morning and 1000 mg in the evening.  This will increase her dose approximately 722.5 mg/m2 every 12 hours 14/21. She will  start this tentatively on May 4.  She sees Dr. Lindi Adie with labs on May 1.  She is currently on her off week.. Labs, vitals, treatment parameters, and manufacturer guidelines assessing toxicity were reviewed with Colleen Lowery today. Based on these values, patient is in agreement to continue therapy at this time. ? ?Allergies ?Allergies  ?Allergen Reactions  ? Alcohol Anaphylaxis and Swelling  ?  Purell hand sanitizer ? ?Swelling in the luNGS, CHEST AND THROAT and seizures ?Problem with smell not the Alcohol ?Other reaction(s): Other (See Comments) ?Causes seizures and swelling ?  ? Contrast Media [Iodinated Contrast Media] Anaphylaxis  ?  Throat closed, was 40 yrs ago ? ?*Iodine   ? Atorvastatin   ?  Myalgia   ? Dust Mite Extract Swelling  ?  SWELLING IS GI SYSTEM  ? Latex   ?  QUESTIONABLE INTOLERABLE MUCOUS OOZING OUT OF THROAT  ? Lisinopril Other (See Comments)  ?  Pt does not remember reaction   ? Molds & Smuts Swelling  ?  SWELLING IS IN GI SYSTEM  ? ? ?Vitals ? ?  02/21/2022  ? 10:54 AM 01/24/2022  ? 10:50 AM 01/03/2022  ? 11:37 AM  ?Vitals with BMI  ?Height 5' 2.5" 5' 2.5" 5' 2.5"  ?Weight 145 lbs 150 lbs 8 oz 146 lbs 5 oz  ?BMI 26.08 27.07 26.32  ?Systolic 355 974 163  ?Diastolic 90 845 88  ?  Pulse 81 84 71  ?  ? ?Laboratory Data ? ?  Latest Ref Rng & Units 02/21/2022  ?  9:55 AM 01/24/2022  ? 10:26 AM 12/26/2021  ?  6:30 AM  ?CBC EXTENDED  ?WBC 4.0 - 10.5 K/uL 5.6   5.6   5.2    ?RBC 3.87 - 5.11 MIL/uL 4.41   4.45   4.51    ?Hemoglobin 12.0 - 15.0 g/dL 13.6   13.4   13.4    ?HCT 36.0 - 46.0 % 40.4   40.6   41.8    ?Platelets 150 - 400 K/uL 175   213   249    ?NEUT# 1.7 - 7.7 K/uL 3.6   3.9     ?Lymph# 0.7 - 4.0 K/uL 1.2   1.2     ?  ? ?  Latest Ref Rng & Units 02/21/2022  ?  9:55 AM 01/24/2022  ? 10:26 AM 12/07/2021  ? 12:54 PM  ?CMP  ?Glucose 70 - 99 mg/dL 183   327   272    ?BUN 8 - 23 mg/dL 11   15   11     ?Creatinine 0.44 - 1.00 mg/dL 0.99   0.97   0.88    ?Sodium 135 - 145 mmol/L 136   134   128    ?Potassium 3.5  - 5.1 mmol/L 3.9   3.9   4.8    ?Chloride 98 - 111 mmol/L 105   103   98    ?CO2 22 - 32 mmol/L 23   23   19     ?Calcium 8.9 - 10.3 mg/dL 9.4   9.9   10.0    ?Total Protein 6.5 - 8.1 g/dL 7.0   6.9   7.8    ?Total Bilirubin 0.3 - 1.2 mg/dL 0.4   0.4   0.3    ?Alkaline Phos 38 - 126 U/L 107   90   95    ?AST 15 - 41 U/L 13   14   16     ?ALT 0 - 44 U/L 7   9   9     ?  ?Lab Results  ?Component Value Date  ? MG 1.9 05/09/2016  ? MG 1.9 02/23/2016  ? MG 1.9 02/07/2016  ?  ? ?Adverse Effects Assessment ?No side effects currently being reported attributed to capecitabine ? ?Adherence Assessment ?Colleen Lowery reports missing 0 doses over the past 3 weeks.   ?Reason for missed dose: n/a ?Patient was re-educated on importance of adherence.  ? ?Access Assessment ?Colleen Lowery is currently receiving her capecitabine through [facility]  ?Insurance concerns:  none ? ?Medication Reconciliation ?The patient's medication list was reviewed today with the patient? Yes ?New medications or herbal supplements have recently been started? No  ?Any medications have been discontinued? No  ?The medication list was updated and reconciled based on the patient's most recent medication list in the electronic medical record (EMR) including herbal products and OTC medications.  ? ?Medications ?Current Outpatient Medications  ?Medication Sig Dispense Refill  ? ADRENAL CORTEX PO Take 4 capsules by mouth daily.    ? alendronate (FOSAMAX) 70 MG tablet Take 1 tablet (70 mg total) by mouth once a week. Take with a full glass of water on an empty stomach. (Patient taking differently: Take 70 mg by mouth every Sunday. Take with a full glass of water on an empty stomach.) 12 tablet 3  ? Ascorbic Acid (VITAMIN C  PO) Take 2,000 mg by mouth daily.    ? B Complex-Folic Acid (B COMPLEX PLUS PO) Take 4 tablets by mouth daily.    ? Beta Carotene (VITAMIN A) 25000 UNIT capsule Take 50,000 Units by mouth daily.    ? CALCIUM PO Take 10 mLs by  mouth daily. Liquid ?300 MG /5 ML    ? capecitabine (XELODA) 500 MG tablet Take 2 tablets (1,000 mg total) by mouth 2 (two) times daily after a meal. 14 days on and 7 days off 56 tablet 6  ? Cholecalciferol (VITAMIN D3) 5000 units CAPS Take 5,000 Units by mouth daily. 2500 units per cap    ? Flaxseed, Linseed, (FLAXSEED OIL) 1000 MG CAPS Take 3,000 mg by mouth daily. Taking as needed    ? furosemide (LASIX) 20 MG tablet Take 20 mg by mouth daily.    ? Homeopathic Products (ARNICARE PAIN RELIEF EX) Apply 1 application topically daily as needed (pain).    ? losartan (COZAAR) 50 MG tablet Take 50 mg by mouth daily.    ? MAGNESIUM PO Take 5 mLs by mouth daily. Liquid ?2.5 ml/150 mg    ? Misc Natural Products (BLOOD SUGAR BALANCE PO) Take 3 capsules by mouth daily. Blood Sugar Manager    ? OVER THE COUNTER MEDICATION Take 1 Scoop by mouth daily. Branched chain amino acids 4000 ? ?Patient states "taking for muscles"    ? OVER THE COUNTER MEDICATION Take 400-800 mg by mouth daily as needed (Based on Blood glucose). gymnema sylvestre ?Patient takes for "lowering blood glucose"    ? OVER THE COUNTER MEDICATION Take 5 capsules by mouth daily. Pau d' arco 500 mg each ? ?Patient states now taking liquid and "keeps mold down"    ? OVER THE COUNTER MEDICATION Take 6 capsules by mouth daily. Caprylic acid ?Patient taking to "lower candida risk"    ? OVER THE COUNTER MEDICATION Take 5 capsules by mouth daily. Pancreatic enzymes ?Patient taking "to get fluid out"    ? OVER THE COUNTER MEDICATION Take 3 capsules by mouth daily. Raw Adrenal ?Patient taking for "overall energy"    ? OVER THE COUNTER MEDICATION Take 750 mg by mouth daily. Gaba Supplement ?Patient taking for "seizures"    ? OVER THE COUNTER MEDICATION Take 6 capsules by mouth daily. Beef Pancreas ?Patient taking for "pancreatic function improvement and lymphatic system improvement"    ? PANTETHINE PO Take 1-2 tablets by mouth daily as needed (Itching). Vitamin B5    ?  potassium chloride SA (K-DUR,KLOR-CON) 20 MEQ tablet Take 20 mEq by mouth 2 (two) times daily.    ? predniSONE (DELTASONE) 50 MG tablet Take 1 tab 13, 7, and 1 hour prior to CT scan.  Take benadryl 35m oral

## 2022-02-22 LAB — CANCER ANTIGEN 27.29: CA 27.29: 66.8 U/mL — ABNORMAL HIGH (ref 0.0–38.6)

## 2022-02-27 ENCOUNTER — Telehealth: Payer: Self-pay | Admitting: Pharmacist

## 2022-02-27 NOTE — Progress Notes (Signed)
? ?Patient Care Team: ?[name], MD as PCP - General (Family Medicine) ?Kyung Rudd, MD as Consulting Physician (Radiation Oncology) ?Nicholas Lose, MD as Consulting Physician (Hematology and Oncology) ?Erroll Luna, MD as Consulting Physician (General Surgery) ? ?DIAGNOSIS:  ?Encounter Diagnosis  ?Name Primary?  ? Malignant neoplasm of upper-outer quadrant of left breast in female, estrogen receptor positive ([name])   ? ? ?SUMMARY OF ONCOLOGIC HISTORY: ?Oncology History  ?Malignant neoplasm of upper-outer quadrant of left breast in female, estrogen receptor positive ([name])  ?03/22/2020 Initial Diagnosis  ? Diffuse left breast swelling. skin thickening and two areas of irregular hypoechogenicity in the 2-3 o'clock region, 4.9cm, and at least 4 abnormal lymph nodes  with cortical thickening. 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  ?04/07/2020 Cancer Staging  ? Staging form: Breast, AJCC 8th Edition ?- Clinical stage from 04/07/2020: Stage IIIB (cT4b, cN1, cM0, G2, ER+, PR+, HER2-)  ?  ?04/19/2020 -  Chemotherapy  ? CMF ?  ?09/14/2020 Surgery  ? Left mastectomy ([name]) [phone]): invasive and in situ ductal carcinoma, grade 2, 15cm, clear margins, with 14/14 lymph nodes positive for metastatic carcinoma with extracapsular extension.  ?  ?10/28/2020 - 12/17/2020 Radiation Therapy  ? The patient initially received a dose of 50.4 Gy in 28 fractions to the left breast and supraclavicular region using whole-breast tangent fields. This was delivered using a 3-D conformal technique. The pt received a boost delivering an additional 10 Gy in 5 fractions using a electron boost with 21mV electrons. The total dose was 60.4 Gy. ?  ?11/2020 - 11/2027 Anti-estrogen oral therapy  ? Anastrozole ?  ?01/30/2022 Miscellaneous  ? Guardant360:ERBB2 (exon 20 insertion): Benefit from Enhertu, MSI high not detected ?  ? ? ?CHIEF COMPLIANT: left  breast cancer ? ?INTERVAL HISTORY: Colleen TEACHEYis a  81y.o. with above-mentioned history of left breast cancer who underwent neoadjuvant chemotherapy, left mastectomy, and radiation therapy. She presents to the clinic today for follow-up. She state that she is tolerating Xeloda.She states that her hands are changing colors. She states that they doesn't hurt but they are drying out. States that her hands has blisters. ? ?ALLERGIES:  is allergic to alcohol, contrast media [iodinated contrast media], iodine, atorvastatin, dust mite extract, latex, lisinopril, and molds & smuts. ? ?MEDICATIONS:  ?Current Outpatient Medications  ?Medication Sig Dispense Refill  ? ADRENAL CORTEX PO Take 4 capsules by mouth daily.    ? alendronate (FOSAMAX) 70 MG tablet Take 1 tablet (70 mg total) by mouth once a week. Take with a full glass of water on an empty stomach. (Patient taking differently: Take 70 mg by mouth every Sunday. Take with a full glass of water on an empty stomach.) 12 tablet 3  ? Ascorbic Acid (VITAMIN C PO) Take 2,000 mg by mouth daily.    ? B Complex-Folic Acid (B COMPLEX PLUS PO) Take 4 tablets by mouth daily.    ? Beta Carotene (VITAMIN A) 25000 UNIT capsule Take 50,000 Units by mouth daily.    ? CALCIUM PO Take 10 mLs by mouth daily. Liquid ?300 MG /5 ML    ? [START ON 03/16/2022] capecitabine (XELODA) 500 MG tablet Take 3 tabs (1500 mg) in morning, and 2 tabs (1000 mg) in the evening for 14 days, followed by a 7 day rest period. Take within 30 minutes of a meal. 70 tablet 0  ? Cholecalciferol (  VITAMIN D3) 5000 units CAPS Take 5,000 Units by mouth daily. 2500 units per cap    ? Flaxseed, Linseed, (FLAXSEED OIL) 1000 MG CAPS Take 3,000 mg by mouth daily. Taking as needed    ? furosemide (LASIX) 20 MG tablet Take 20 mg by mouth daily.    ? Homeopathic Products (ARNICARE PAIN RELIEF EX) Apply 1 application topically daily as needed (pain).    ? losartan (COZAAR) 50 MG tablet Take 50 mg by mouth daily.    ? MAGNESIUM  PO Take 5 mLs by mouth daily. Liquid ?2.5 ml/150 mg    ? Misc Natural Products (BLOOD SUGAR BALANCE PO) Take 3 capsules by mouth daily. Blood Sugar Manager    ? OVER THE COUNTER MEDICATION Take 1 Scoop by mouth daily. Branched chain amino acids 4000 ? ?Patient states "taking for muscles"    ? OVER THE COUNTER MEDICATION Take 400-800 mg by mouth daily as needed (Based on Blood glucose). gymnema sylvestre ?Patient takes for "lowering blood glucose"    ? OVER THE COUNTER MEDICATION Take 5 capsules by mouth daily. Pau d' arco 500 mg each ? ?Patient states now taking liquid and "keeps mold down"    ? OVER THE COUNTER MEDICATION Take 6 capsules by mouth daily. Caprylic acid ?Patient taking to "lower candida risk"    ? OVER THE COUNTER MEDICATION Take 5 capsules by mouth daily. Pancreatic enzymes ?Patient taking "to get fluid out"    ? OVER THE COUNTER MEDICATION Take 3 capsules by mouth daily. Raw Adrenal ?Patient taking for "overall energy"    ? OVER THE COUNTER MEDICATION Take 750 mg by mouth daily. Gaba Supplement ?Patient taking for "seizures"    ? OVER THE COUNTER MEDICATION Take 6 capsules by mouth daily. Beef Pancreas ?Patient taking for "pancreatic function improvement and lymphatic system improvement"    ? PANTETHINE PO Take 1-2 tablets by mouth daily as needed (Itching). Vitamin B5    ? potassium chloride SA (K-DUR,KLOR-CON) 20 MEQ tablet Take 20 mEq by mouth 2 (two) times daily.    ? predniSONE (DELTASONE) 50 MG tablet Take 1 tab 13, 7, and 1 hour prior to CT scan.  Take benadryl 39m oral 1 hour prior to CT scan (Patient not taking: Reported on 12/21/2021) 3 tablet 0  ? Propylene Glycol (SYSTANE BALANCE) 0.6 % SOLN Place 2 drops into the right eye in the morning and at bedtime. (Patient not taking: Reported on 01/24/2022)    ? ?No current facility-administered medications for this visit.  ? ? ?PHYSICAL EXAMINATION: ?ECOG PERFORMANCE STATUS: 1 - Symptomatic but completely ambulatory ? ?Vitals:  ? 03/13/22 1119   ?BP: (!) 189/109  ?Pulse: 94  ?Resp: 18  ?Temp: (!) 97.3 ?F (36.3 ?C)  ?SpO2: 100%  ? ?Filed Weights  ? 03/13/22 1119  ?Weight: 146 lb 8 oz (66.5 kg)  ? ?  ? ? ?LABORATORY DATA:  ?I have reviewed the data as listed ? ?  Latest Ref Rng & Units 03/13/2022  ? 11:07 AM 02/21/2022  ?  9:55 AM 01/24/2022  ? 10:26 AM  ?CMP  ?Glucose 70 - 99 mg/dL 172   183   327    ?BUN 8 - 23 mg/dL _0 ?Creatinine 0.44 - 1.00 mg/dL 0.88   0.99   0.97    ?Sodium 135 - 145 mmol/L 136   136   134    ?Potassium 3.5 - 5.1 mmol/L 4.5   3.9  3.9    ?Chloride 98 - 111 mmol/L 109   105   103    ?CO2 22 - 32 mmol/L _0 ?Calcium 8.9 - 10.3 mg/dL 9.1   9.4   9.9    ?Total Protein 6.5 - 8.1 g/dL 7.0   7.0   6.9    ?Total Bilirubin 0.3 - 1.2 mg/dL 0.5   0.4   0.4    ?Alkaline Phos 38 - 126 U/L 106   107   90    ?AST 15 - 41 U/L _1 ?ALT 0 - 44 U/L _2 ? ? ?Lab Results  ?Component Value Date  ? WBC 6.1 03/13/2022  ? HGB 13.5 03/13/2022  ? HCT 41.2 03/13/2022  ? MCV 94.9 03/13/2022  ? PLT 174 03/13/2022  ? NEUTROABS 3.9 03/13/2022  ? ? ?ASSESSMENT & PLAN:  ?Malignant neoplasm of upper-outer quadrant of left breast in female, estrogen receptor positive ([place]) ?03/22/2020:diffuse left breast swelling. skin thickening and two areas of irregular hypoechogenicity in the 2-3 o'clock region, 4.9cm, and at least 4 abnormal lymph nodes  with cortical thickening. 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  ?Treatment plan: ?1.  Neoadjuvant chemotherapy with CMF ?2.  09/14/2020:Left mastectomy ([name]): invasive and in situ ductal carcinoma, grade 2, 15cm, clear margins, with 14/14 lymph nodes positive for metastatic carcinoma with extracapsular extension.  ER 30%, PR 10%, HER-2 negative, Ki-67 15% ?3.  Adjuvant radiation 10/28/2020-12/17/2020 ?4.  Followed  by adjuvant antiestrogen therapy with anastrozole started 01/11/2021 ?-------------------------------------------------------------------------------------------------------------------------------------

## 2022-02-27 NOTE — Telephone Encounter (Signed)
[place]  ?     Telephone: [phone]?Fax: [phone]  ? ?Oncology Clinical Pharmacist Practitioner Progress Note ? ?Colleen Lowery is a 81 y.o. female with a diagnosis of metastatic breast cancer currently on capecitabine under the care of Dr. Nicholas Lose.  ? ?Colleen Lowery was contacted today by clinical pharmacy via telephone.  We verified we were speaking to the correct patient with 2 patient identifiers.  Colleen Lowery had contacted clinical pharmacy and left a voicemail. She had some questions regarding her current prescription and the last visit note.  She wanted to confirm that her current dose of capecitabine is at 1000 mg in the a.m. and 1000 mg in the p.m. for 14 days, followed by a 7-day rest period.  We confirmed that this is the correct dose.  Colleen Lowery confirms that she started this dose on Thursday, 02/23/22.  We clarified that for her next dose, which will start tentatively on 03/16/22, that prescription is 1500 mg in the a.m. and 1000 mg in the p.m. for 14 days followed by a 7-day rest period.  This new prescription was sent as discussed at her visit to the [facility] outpatient pharmacy.  It has a start date of 03/16/2022.  Colleen Lowery states that she is doing well and she knows to contact the clinic with any questions or concerns in the interim before her visit with Dr. Lindi Adie on 03/13/22. ? ?Clinical pharmacy will continue to support [name] and Dr. Nicholas Lose as needed. ? ?Raina Mina, RPH-CPP,  ?02/27/2022  8:33 AM  ? ?**Disclaimer: This note was dictated with voice recognition software. Similar sounding words can inadvertently be transcribed and this note may contain transcription errors which may not have been corrected upon publication of note.** ? ?

## 2022-03-03 ENCOUNTER — Other Ambulatory Visit (HOSPITAL_COMMUNITY): Payer: Self-pay

## 2022-03-06 ENCOUNTER — Other Ambulatory Visit (HOSPITAL_COMMUNITY): Payer: Self-pay

## 2022-03-13 ENCOUNTER — Inpatient Hospital Stay: Payer: Medicare HMO | Attending: Hematology and Oncology

## 2022-03-13 ENCOUNTER — Inpatient Hospital Stay: Payer: Medicare HMO | Admitting: Hematology and Oncology

## 2022-03-13 ENCOUNTER — Other Ambulatory Visit: Payer: Self-pay

## 2022-03-13 DIAGNOSIS — C50412 Malignant neoplasm of upper-outer quadrant of left female breast: Secondary | ICD-10-CM | POA: Insufficient documentation

## 2022-03-13 DIAGNOSIS — Z170 Estrogen receptor positive status [ER+]: Secondary | ICD-10-CM

## 2022-03-13 DIAGNOSIS — C787 Secondary malignant neoplasm of liver and intrahepatic bile duct: Secondary | ICD-10-CM | POA: Diagnosis not present

## 2022-03-13 LAB — CBC WITH DIFFERENTIAL (CANCER CENTER ONLY)
Abs Immature Granulocytes: 0.06 10*3/uL (ref 0.00–0.07)
Basophils Absolute: 0 10*3/uL (ref 0.0–0.1)
Basophils Relative: 1 %
Eosinophils Absolute: 0.1 10*3/uL (ref 0.0–0.5)
Eosinophils Relative: 1 %
HCT: 41.2 % (ref 36.0–46.0)
Hemoglobin: 13.5 g/dL (ref 12.0–15.0)
Immature Granulocytes: 1 %
Lymphocytes Relative: 21 %
Lymphs Abs: 1.3 10*3/uL (ref 0.7–4.0)
MCH: 31.1 pg (ref 26.0–34.0)
MCHC: 32.8 g/dL (ref 30.0–36.0)
MCV: 94.9 fL (ref 80.0–100.0)
Monocytes Absolute: 0.8 10*3/uL (ref 0.1–1.0)
Monocytes Relative: 12 %
Neutro Abs: 3.9 10*3/uL (ref 1.7–7.7)
Neutrophils Relative %: 64 %
Platelet Count: 174 10*3/uL (ref 150–400)
RBC: 4.34 MIL/uL (ref 3.87–5.11)
RDW: 17.1 % — ABNORMAL HIGH (ref 11.5–15.5)
WBC Count: 6.1 10*3/uL (ref 4.0–10.5)
nRBC: 0 % (ref 0.0–0.2)

## 2022-03-13 LAB — CMP (CANCER CENTER ONLY)
ALT: 7 U/L (ref 0–44)
AST: 14 U/L — ABNORMAL LOW (ref 15–41)
Albumin: 4.1 g/dL (ref 3.5–5.0)
Alkaline Phosphatase: 106 U/L (ref 38–126)
Anion gap: 5 (ref 5–15)
BUN: 11 mg/dL (ref 8–23)
CO2: 22 mmol/L (ref 22–32)
Calcium: 9.1 mg/dL (ref 8.9–10.3)
Chloride: 109 mmol/L (ref 98–111)
Creatinine: 0.88 mg/dL (ref 0.44–1.00)
GFR, Estimated: >60 mL/min (ref >60)
Glucose, Bld: 172 mg/dL — ABNORMAL HIGH (ref 70–99)
Potassium: 4.5 mmol/L (ref 3.5–5.1)
Sodium: 136 mmol/L (ref 135–145)
Total Bilirubin: 0.5 mg/dL (ref 0.3–1.2)
Total Protein: 7 g/dL (ref 6.5–8.1)

## 2022-03-13 NOTE — Assessment & Plan Note (Signed)
03/22/2020:diffuse left breast swelling. skin thickening and two areas of irregular hypoechogenicity in the 2-3 o'clock region, 4.9cm, and at least 4 abnormal lymph nodes ?with cortical thickening. 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 ?Treatment plan: ?1.??Neoadjuvant chemotherapy?with CMF ?2.??09/14/2020:Left mastectomy ([name]): invasive and in situ ductal carcinoma, grade 2, 15cm, clear margins, with 14/14 lymph nodes positive for metastatic carcinoma with extracapsular extension.??ER 30%, PR 10%, HER-2 negative, Ki-67 15% ?3.??Adjuvant radiation 10/28/2020-12/17/2020 ?4.??Followed by adjuvant antiestrogen therapy with anastrozole started 01/11/2021 ?------------------------------------------------------------------------------------------------------------------------------------------- ?CT chest abdomen pelvis: 12/07/2021: New enhancing lesion liver dome 3.2?centimeters, left kidney hemorrhagic cyst (likely benign), unchanged groundglass opacity right lower lobe 0.6 cm (nonspecific) ?? ?Liver biopsy 12/26/2021: Consistent with metastatic breast cancer (CK7 positive, CK20 positive, GA TA-3 positive) ER 0%, PR 0%, HER2 1+ by IHC (negative) ? ?Current treatment: Capecitabine 1000 mg p.o. twice daily 2 weeks on 1 week off started 01/11/2022 ?Capecitabine toxicities: ? ?Return to clinic in 1 month for scans and follow-up ? ? ?

## 2022-03-14 LAB — CANCER ANTIGEN 27.29: CA 27.29: 50.5 U/mL — ABNORMAL HIGH (ref 0.0–38.6)

## 2022-03-15 ENCOUNTER — Inpatient Hospital Stay: Payer: Medicare HMO | Admitting: Pharmacist

## 2022-03-15 ENCOUNTER — Other Ambulatory Visit (HOSPITAL_COMMUNITY): Payer: Self-pay

## 2022-03-15 ENCOUNTER — Telehealth: Payer: Self-pay | Admitting: Pharmacist

## 2022-03-15 DIAGNOSIS — C50412 Malignant neoplasm of upper-outer quadrant of left female breast: Secondary | ICD-10-CM

## 2022-03-15 MED ORDER — CAPECITABINE 500 MG PO TABS
1000.0000 mg | ORAL_TABLET | Freq: Two times a day (BID) | ORAL | 6 refills | Status: DC
Start: 2022-03-15 — End: 2022-08-02
  Filled 2022-03-15: qty 56, 21d supply, fill #0
  Filled 2022-03-28: qty 56, 21d supply, fill #1
  Filled 2022-04-18: qty 56, 21d supply, fill #2
  Filled 2022-05-11: qty 56, 21d supply, fill #3
  Filled 2022-05-30: qty 56, 21d supply, fill #4
  Filled 2022-06-20: qty 56, 21d supply, fill #5
  Filled 2022-07-21: qty 56, 21d supply, fill #6

## 2022-03-15 NOTE — Telephone Encounter (Signed)
[place]  ?     Telephone: [phone]?Fax: [phone]  ? ?Oncology Clinical Pharmacist Practitioner Progress Note ? ?Colleen Lowery is a 81 y.o. female with a diagnosis of metastatic breast cancer currently on capecitabine under the care of Colleen Lowery.  ? ?Colleen Lowery contacted Colleen Lowery clinic today inquiring about her capecitabine prescription.  Colleen Lowery was contacted by clinical pharmacy after receiving this message.  We verified that we were speaking to her using 2 patient identifiers.  We explained to Colleen Lowery that [employer] had reached out initially but per the report, Colleen Lowery stated she did not want the capecitabine sent at that time.  Colleen Lowery specialty pharmacy then tried reaching the patient back and left a voicemail.  The initial capecitabine prescription was sent after our last visit on 02/21/22 and at that time we were going to increase the dose of capecitabine.  Colleen Lowery states today that she met with Colleen Lowery on 03/13/22 and the plan is to continue on her current dose which is 1000 mg every 12 hours 14 days on followed by a 7-day rest period.  We have updated the prescription to reflect these recommendations and the capecitabine should be shipped today for tomorrow delivery.  We have explained this to Colleen Lowery and she verbalized understanding of the plan. ? ?Her next scheduled appointment with clinical pharmacy and labs is on 04/03/22.  Colleen Lowery knows to contact the clinic sooner should any questions or concerns arise in the interim. ? ?Clinical pharmacy will continue to support Colleen Lowery and Colleen Lowery as needed. ? ?Colleen Lowery, RPH-CPP,  ?03/15/2022  2:41 PM  ? ?**Disclaimer: This note was dictated with voice recognition software. Similar sounding words can inadvertently be transcribed and this note may contain transcription errors which may not have been corrected upon publication of note.** ? ?

## 2022-03-15 NOTE — Progress Notes (Signed)
Err. Telephone encounter note from same date ?

## 2022-03-21 ENCOUNTER — Other Ambulatory Visit (HOSPITAL_COMMUNITY): Payer: Self-pay

## 2022-03-28 ENCOUNTER — Other Ambulatory Visit (HOSPITAL_COMMUNITY): Payer: Self-pay

## 2022-04-03 ENCOUNTER — Inpatient Hospital Stay: Payer: Medicare HMO | Admitting: Pharmacist

## 2022-04-03 ENCOUNTER — Other Ambulatory Visit: Payer: Self-pay

## 2022-04-03 ENCOUNTER — Other Ambulatory Visit (HOSPITAL_COMMUNITY): Payer: Self-pay

## 2022-04-03 ENCOUNTER — Inpatient Hospital Stay: Payer: Medicare HMO

## 2022-04-03 VITALS — BP 172/79 | HR 77 | Temp 97.7°F | Resp 18 | Ht 62.5 in | Wt 144.8 lb

## 2022-04-03 DIAGNOSIS — C50919 Malignant neoplasm of unspecified site of unspecified female breast: Secondary | ICD-10-CM

## 2022-04-03 DIAGNOSIS — C50412 Malignant neoplasm of upper-outer quadrant of left female breast: Secondary | ICD-10-CM | POA: Diagnosis not present

## 2022-04-03 DIAGNOSIS — Z170 Estrogen receptor positive status [ER+]: Secondary | ICD-10-CM

## 2022-04-03 LAB — CBC WITH DIFFERENTIAL (CANCER CENTER ONLY)
Abs Immature Granulocytes: 0.07 10*3/uL (ref 0.00–0.07)
Basophils Absolute: 0 10*3/uL (ref 0.0–0.1)
Basophils Relative: 0 %
Eosinophils Absolute: 0.1 10*3/uL (ref 0.0–0.5)
Eosinophils Relative: 1 %
HCT: 39.4 % (ref 36.0–46.0)
Hemoglobin: 13.7 g/dL (ref 12.0–15.0)
Immature Granulocytes: 1 %
Lymphocytes Relative: 26 %
Lymphs Abs: 1.7 10*3/uL (ref 0.7–4.0)
MCH: 32.7 pg (ref 26.0–34.0)
MCHC: 34.8 g/dL (ref 30.0–36.0)
MCV: 94 fL (ref 80.0–100.0)
Monocytes Absolute: 0.7 10*3/uL (ref 0.1–1.0)
Monocytes Relative: 10 %
Neutro Abs: 4.1 10*3/uL (ref 1.7–7.7)
Neutrophils Relative %: 62 %
Platelet Count: 180 10*3/uL (ref 150–400)
RBC: 4.19 MIL/uL (ref 3.87–5.11)
RDW: 16.8 % — ABNORMAL HIGH (ref 11.5–15.5)
WBC Count: 6.7 10*3/uL (ref 4.0–10.5)
nRBC: 0 % (ref 0.0–0.2)

## 2022-04-03 LAB — CMP (CANCER CENTER ONLY)
ALT: 6 U/L (ref 0–44)
AST: 13 U/L — ABNORMAL LOW (ref 15–41)
Albumin: 4.1 g/dL (ref 3.5–5.0)
Alkaline Phosphatase: 111 U/L (ref 38–126)
Anion gap: 7 (ref 5–15)
BUN: 16 mg/dL (ref 8–23)
CO2: 25 mmol/L (ref 22–32)
Calcium: 9.6 mg/dL (ref 8.9–10.3)
Chloride: 102 mmol/L (ref 98–111)
Creatinine: 1.02 mg/dL — ABNORMAL HIGH (ref 0.44–1.00)
GFR, Estimated: 56 mL/min — ABNORMAL LOW (ref >60)
Glucose, Bld: 254 mg/dL — ABNORMAL HIGH (ref 70–99)
Potassium: 3.6 mmol/L (ref 3.5–5.1)
Sodium: 134 mmol/L — ABNORMAL LOW (ref 135–145)
Total Bilirubin: 0.4 mg/dL (ref 0.3–1.2)
Total Protein: 7.2 g/dL (ref 6.5–8.1)

## 2022-04-03 MED ORDER — PREDNISONE 50 MG PO TABS: ORAL_TABLET | ORAL | 0 refills | Status: DC

## 2022-04-03 NOTE — Progress Notes (Signed)
[place]       Telephone: [phone]?Fax: [phone]   Oncology Clinical Pharmacist Practitioner Progress Note  Colleen Lowery was contacted via in person visit to discuss her chemotherapy regimen for capecitabine which they receive under the care of Dr. Nicholas Lose.    Current treatment regimen and start date Capecitabine (01/12/22)   Interval History She continues on capecitabine 2 tablets (1000 mg) in the morning and 2 tablets (1000 mg) in the evening on days 1 to 14 of a 21-day cycle. This is being given monotherapy. Therapy is planned to continue until disease progression or unacceptable toxicity. [name]   Response to Therapy Colleen Lowery was seen today by clinical pharmacy as a follow-up to her capecitabine management.  She last saw Dr. Lindi Adie on 03/13/22.  At that time, it was decided that we would continue on the capecitabine 1000 mg every 12 hours 14 days followed by a 7 day rest period on of a 21-day supply.  Today Colleen Lowery states that she is doing well.  She had 1 small blister on her left hand that is healed.  She is not reporting any nausea, vomiting, or diarrhea.  Her serum creatinine today is slightly elevated at 1.02 mg/dL.  This is up from 0.88 at her last visit.  She does state that she is now drinking more low sugar Gatorade but this was only started recently.  She says before this she was drinking a lot of water.  Her blood pressure was checked several times today due to it being elevated with our machine. On the third check via manual reading by Diane, the NT, it was 172/89.  Colleen Lowery states that her blood pressure is managed by Dr. Clemmie Krill.  She last saw Dr. Clemmie Krill in March and normally sees her every 6 months.  Colleen Lowery states that she continues to take her blood pressure medicine as prescribed and does check her blood pressure at home regularly.  She states the readings at home range from systolic 456-256 and diastolic 38-93.  We did discuss with Colleen Lowery, who  is a retired Designer, jewellery, to contact Dr. Clemmie Krill or our clinic should she experience any symptoms related to hypertension or any new concerning symptoms.  She verbalized understanding.  Dr. Lindi Adie would like her to receive restaging scans at the end of the month.  A CT CAP with contrast has been ordered.  Colleen Lowery does have a documented allergy to contrast but has received oral contrast in the past per her report.  Per Dr. [name] instructions, we have prescribed, which she has had in the past, prednisone to be taken prior to her CT scan and diphenhydramine over-the-counter.  We reviewed these instructions which are detailed in her summary below and we also gave her written instructions on how to take the prednisone and diphenhydramine.  She verbalized understanding.  She will next see Dr. Lindi Adie with labs on 05/18/22 and at that time he will review the scans that have been ordered today.  Clinical pharmacy discussed with Colleen Lowery that if her scans show stable disease that we can likely see her for her capecitabine management sometime in September. She will continue to receive labs in the interim.  Labs, vitals, treatment parameters, and manufacturer guidelines assessing toxicity were reviewed with Colleen Lowery today. Based on these values, patient is in agreement to continue therapy at this time.  Allergies Allergies  Allergen Reactions   Alcohol Anaphylaxis and Swelling  Purell hand sanitizer  Swelling in the luNGS, CHEST AND THROAT and seizures Problem with smell not the Alcohol Other reaction(s): Other (See Comments) Causes seizures and swelling    Contrast Media [Iodinated Contrast Media] Anaphylaxis    Throat closed, was 40 yrs ago  *Iodine    Iodine Anaphylaxis    Throat closed, was 40 yrs ago   Atorvastatin     Myalgia    Dust Mite Extract Swelling    SWELLING IS GI SYSTEM   Latex     QUESTIONABLE INTOLERABLE MUCOUS OOZING OUT OF THROAT   Lisinopril Other (See Comments)     Pt does not remember reaction    Molds & Smuts Swelling    SWELLING IS IN GI SYSTEM    Vitals    04/03/2022    1:30 PM 04/03/2022   12:59 PM 03/13/2022   11:19 AM  Vitals with BMI  Height  5' 2.5" 5' 2.5"  Weight  144 lbs 13 oz 146 lbs 8 oz  BMI  50.35 46.56  Systolic 812 751 700  Diastolic 79 99 174  Pulse  77 94     Laboratory Data    Latest Ref Rng & Units 04/03/2022   12:30 PM 03/13/2022   11:07 AM 02/21/2022    9:55 AM  CBC EXTENDED  WBC 4.0 - 10.5 K/uL 6.7   6.1   5.6    RBC 3.87 - 5.11 MIL/uL 4.19   4.34   4.41    Hemoglobin 12.0 - 15.0 g/dL 13.7   13.5   13.6    HCT 36.0 - 46.0 % 39.4   41.2   40.4    Platelets 150 - 400 K/uL 180   174   175    NEUT# 1.7 - 7.7 K/uL 4.1   3.9   3.6    Lymph# 0.7 - 4.0 K/uL 1.7   1.3   1.2         Latest Ref Rng & Units 04/03/2022   12:30 PM 03/13/2022   11:07 AM 02/21/2022    9:55 AM  CMP  Glucose 70 - 99 mg/dL 254   172   183    BUN 8 - 23 mg/dL 16   11   11     Creatinine 0.44 - 1.00 mg/dL 1.02   0.88   0.99    Sodium 135 - 145 mmol/L 134   136   136    Potassium 3.5 - 5.1 mmol/L 3.6   4.5   3.9    Chloride 98 - 111 mmol/L 102   109   105    CO2 22 - 32 mmol/L 25   22   23     Calcium 8.9 - 10.3 mg/dL 9.6   9.1   9.4    Total Protein 6.5 - 8.1 g/dL 7.2   7.0   7.0    Total Bilirubin 0.3 - 1.2 mg/dL 0.4   0.5   0.4    Alkaline Phos 38 - 126 U/L 111   106   107    AST 15 - 41 U/L 13   14   13     ALT 0 - 44 U/L 6   7   7       Lab Results  Component Value Date   MG 1.9 05/09/2016   MG 1.9 02/23/2016   MG 1.9 02/07/2016     Adverse Effects Assessment Hyperpigmentation of the hands: Improving.  Small blister on her left  finger has improved. Increased serum creatinine: Slightly elevated from her last visit.  She reports now drinking low sugar Gatorade.  Continue to monitor. Decrease sodium: Just below the lower limit of normal.  Continue to monitor. Elevated blood pressure: Rechecked 3 times today.  Third time with manual as  above.  She continues to have elevated blood pressure at our facility but confirms acceptable ranges as above when at home and at Dr. Eula Flax office.  Adherence Assessment Colleen Lowery reports missing 0 doses over the past 3 weeks.  Did start this current cycle 1 day late due to shipping issues.  Cycle 4 was started on 03/17/22.  Cycle 6 is due 04/07/22.  We will contact [facility] to make sure they are sending the prescription in a timely manner. Reason for missed dose: Did not have medication on time Patient was re-educated on importance of adherence.   Access Assessment Colleen Lowery is currently receiving her capecitabine through [facility] concerns: None  Medication Reconciliation The patient's medication list was reviewed today with the patient?  Yes New medications or herbal supplements have recently been started?  No Any medications have been discontinued?  No The medication list was updated and reconciled based on the patient's most recent medication list in the electronic medical record (EMR) including herbal products and OTC medications.   Medications Current Outpatient Medications  Medication Sig Dispense Refill   ADRENAL CORTEX PO Take 4 capsules by mouth daily.     alendronate (FOSAMAX) 70 MG tablet Take 1 tablet (70 mg total) by mouth once a week. Take with a full glass of water on an empty stomach. (Patient taking differently: Take 70 mg by mouth every Sunday. Take with a full glass of water on an empty stomach.) 12 tablet 3   Ascorbic Acid (VITAMIN C PO) Take 2,000 mg by mouth daily.     B Complex-Folic Acid (B COMPLEX PLUS PO) Take 4 tablets by mouth daily.     Beta Carotene (VITAMIN A) 25000 UNIT capsule Take 50,000 Units by mouth daily.     CALCIUM PO Take 10 mLs by mouth daily. Liquid 300 MG /5 ML     capecitabine (XELODA) 500 MG tablet Take 2 tablets (1,000 mg total) by mouth 2 (two) times daily after a meal. 14 days on  and 7 days off 56 tablet 6   Cholecalciferol (VITAMIN D3) 5000 units CAPS Take 5,000 Units by mouth daily. 2500 units per cap     Flaxseed, Linseed, (FLAXSEED OIL) 1000 MG CAPS Take 3,000 mg by mouth daily. Taking as needed     furosemide (LASIX) 20 MG tablet Take 20 mg by mouth daily.     Homeopathic Products (ARNICARE PAIN RELIEF EX) Apply 1 application topically daily as needed (pain).     losartan (COZAAR) 50 MG tablet Take 50 mg by mouth daily.     MAGNESIUM PO Take 5 mLs by mouth daily. Liquid 2.5 ml/150 mg     Misc Natural Products (BLOOD SUGAR BALANCE PO) Take 3 capsules by mouth daily. Blood Sugar Manager     OVER THE COUNTER MEDICATION Take 1 Scoop by mouth daily. Branched chain amino acids 4000  Patient states "taking for muscles"     OVER THE COUNTER MEDICATION Take 400-800 mg by mouth daily as needed (Based on Blood glucose). gymnema sylvestre Patient takes for "lowering blood glucose"     OVER THE COUNTER MEDICATION Take 5 capsules by mouth daily.  Pau d' arco 500 mg each  Patient states now taking liquid and "keeps mold down"     OVER THE COUNTER MEDICATION Take 6 capsules by mouth daily. Caprylic acid Patient taking to "lower candida risk"     OVER THE COUNTER MEDICATION Take 5 capsules by mouth daily. Pancreatic enzymes Patient taking "to get fluid out"     OVER THE COUNTER MEDICATION Take 3 capsules by mouth daily. Raw Adrenal Patient taking for "overall energy"     OVER THE COUNTER MEDICATION Take 750 mg by mouth daily. Gaba Supplement Patient taking for "seizures"     OVER THE COUNTER MEDICATION Take 6 capsules by mouth daily. Beef Pancreas Patient taking for "pancreatic function improvement and lymphatic system improvement"     PANTETHINE PO Take 1-2 tablets by mouth daily as needed (Itching). Vitamin B5     potassium chloride SA (K-DUR,KLOR-CON) 20 MEQ tablet Take 20 mEq by mouth 2 (two) times daily.     predniSONE (DELTASONE) 50 MG tablet Take 1 tab 13, 7, and 1  hour prior to CT scan.  Take benadryl 71m oral 1 hour prior to CT scan (Patient not taking: Reported on 12/21/2021) 3 tablet 0   Propylene Glycol (SYSTANE BALANCE) 0.6 % SOLN Place 2 drops into the right eye in the morning and at bedtime. (Patient not taking: Reported on 01/24/2022)     No current facility-administered medications for this visit.    Drug-Drug Interactions (DDIs) DDIs were evaluated?  Yes Significant DDIs?  No.  As discussed in the past, Ms. Petrides takes many herbal supplements.  She understands that it is difficult to properly assess all potential interactions. The patient was instructed to speak with their health care provider and/or the oral chemotherapy pharmacist before starting any new drug, including prescription or over the counter, natural / herbal products, or vitamins.  Supportive Care Continue to monitor blood pressure at home.  Follow-up with Dr. BClemmie Krillfor any abnormal readings. Continue drinking plenty of fluids Continue using lotions on hands and feet and monitoring for signs of palmar plantar erythrodysesthesia  Dosing Assessment Hepatic adjustments needed?  No Renal adjustments needed?  No Toxicity adjustments needed?  No The current dosing regimen is appropriate to continue at this time.  Follow-Up Plan Restaging scans ordered per Dr. GLindi Adiefor end of June. Contrast Dye Allergy instructions from Dr. GLindi Adiegiven to patient with regards on how to take prednisone and diphenhydramine Prednisone: 50 mg by mouth 13 hours prior to study, 7 hours prior to study, and 1 hour prior to study Diphenhydramine: 50 mg by mouth 1 hour prior to study Labs, Dr. GLindi Adievisit on 05/18/22 (scheduled) Labs, pharmacy clinic visit first week of September. Continue labs every 3 weeks in the interim. Continue capecitabine 1000 mg every 12 hours 14 days on, followed by a 7 day rest period. Cycle 5 to start 04/07/22 Continue to follow with Dr. BClemmie Krillfor hypertension management.   Continue to check blood pressure readings at home and log  LMarcelle Smilingparticipated in the discussion, expressed understanding, and voiced agreement with the above plan. All questions were answered to her satisfaction. The patient was advised to contact the clinic at [phone] with any questions or concerns prior to her return visit.   I spent 30 minutes assessing and educating the patient.  JRaina Mina RPH-CPP, 04/03/2022  12:33 PM   **Disclaimer: This note was dictated with voice recognition software. Similar sounding words can inadvertently be transcribed and this  note may contain transcription errors which may not have been corrected upon publication of note.**

## 2022-04-04 ENCOUNTER — Telehealth: Payer: Self-pay | Admitting: Pharmacist

## 2022-04-04 ENCOUNTER — Other Ambulatory Visit (HOSPITAL_COMMUNITY): Payer: Self-pay

## 2022-04-04 LAB — CANCER ANTIGEN 27.29: CA 27.29: 41.6 U/mL — ABNORMAL HIGH (ref 0.0–38.6)

## 2022-04-04 NOTE — Telephone Encounter (Signed)
Scheduled appointment per 05/22 los. Patient aware.

## 2022-04-17 ENCOUNTER — Other Ambulatory Visit (HOSPITAL_COMMUNITY): Payer: Self-pay

## 2022-04-18 ENCOUNTER — Other Ambulatory Visit (HOSPITAL_COMMUNITY): Payer: Self-pay

## 2022-04-18 ENCOUNTER — Other Ambulatory Visit: Payer: Self-pay | Admitting: Family Medicine

## 2022-04-18 DIAGNOSIS — Z1231 Encounter for screening mammogram for malignant neoplasm of breast: Secondary | ICD-10-CM

## 2022-04-24 ENCOUNTER — Other Ambulatory Visit: Payer: Self-pay

## 2022-04-24 ENCOUNTER — Other Ambulatory Visit (HOSPITAL_COMMUNITY): Payer: Self-pay

## 2022-04-24 ENCOUNTER — Inpatient Hospital Stay: Payer: Medicare HMO | Attending: Hematology and Oncology

## 2022-04-24 DIAGNOSIS — C50412 Malignant neoplasm of upper-outer quadrant of left female breast: Secondary | ICD-10-CM | POA: Diagnosis present

## 2022-04-24 DIAGNOSIS — Z170 Estrogen receptor positive status [ER+]: Secondary | ICD-10-CM | POA: Diagnosis not present

## 2022-04-24 LAB — CBC WITH DIFFERENTIAL (CANCER CENTER ONLY)
Abs Immature Granulocytes: 0.07 10*3/uL (ref 0.00–0.07)
Basophils Absolute: 0 10*3/uL (ref 0.0–0.1)
Basophils Relative: 1 %
Eosinophils Absolute: 0 10*3/uL (ref 0.0–0.5)
Eosinophils Relative: 1 %
HCT: 40.9 % (ref 36.0–46.0)
Hemoglobin: 14.1 g/dL (ref 12.0–15.0)
Immature Granulocytes: 1 %
Lymphocytes Relative: 23 %
Lymphs Abs: 1.2 10*3/uL (ref 0.7–4.0)
MCH: 32.9 pg (ref 26.0–34.0)
MCHC: 34.5 g/dL (ref 30.0–36.0)
MCV: 95.6 fL (ref 80.0–100.0)
Monocytes Absolute: 0.7 10*3/uL (ref 0.1–1.0)
Monocytes Relative: 13 %
Neutro Abs: 3.1 10*3/uL (ref 1.7–7.7)
Neutrophils Relative %: 61 %
Platelet Count: 164 10*3/uL (ref 150–400)
RBC: 4.28 MIL/uL (ref 3.87–5.11)
RDW: 16.3 % — ABNORMAL HIGH (ref 11.5–15.5)
WBC Count: 5.1 10*3/uL (ref 4.0–10.5)
nRBC: 0 % (ref 0.0–0.2)

## 2022-04-24 LAB — CMP (CANCER CENTER ONLY)
ALT: 7 U/L (ref 0–44)
AST: 13 U/L — ABNORMAL LOW (ref 15–41)
Albumin: 4.1 g/dL (ref 3.5–5.0)
Alkaline Phosphatase: 106 U/L (ref 38–126)
Anion gap: 8 (ref 5–15)
BUN: 17 mg/dL (ref 8–23)
CO2: 25 mmol/L (ref 22–32)
Calcium: 9.6 mg/dL (ref 8.9–10.3)
Chloride: 104 mmol/L (ref 98–111)
Creatinine: 0.95 mg/dL (ref 0.44–1.00)
GFR, Estimated: >60 mL/min (ref >60)
Glucose, Bld: 184 mg/dL — ABNORMAL HIGH (ref 70–99)
Potassium: 4 mmol/L (ref 3.5–5.1)
Sodium: 137 mmol/L (ref 135–145)
Total Bilirubin: 0.6 mg/dL (ref 0.3–1.2)
Total Protein: 6.8 g/dL (ref 6.5–8.1)

## 2022-04-25 LAB — CANCER ANTIGEN 27.29: CA 27.29: 41.5 U/mL — ABNORMAL HIGH (ref 0.0–38.6)

## 2022-05-08 ENCOUNTER — Ambulatory Visit (HOSPITAL_COMMUNITY)
Admission: RE | Admit: 2022-05-08 | Discharge: 2022-05-08 | Disposition: A | Discharge status: Home / Self Care | Payer: Medicare HMO | Source: Ambulatory Visit | Attending: Hematology and Oncology | Admitting: Hematology and Oncology

## 2022-05-08 DIAGNOSIS — C787 Secondary malignant neoplasm of liver and intrahepatic bile duct: Secondary | ICD-10-CM | POA: Insufficient documentation

## 2022-05-08 DIAGNOSIS — C50919 Malignant neoplasm of unspecified site of unspecified female breast: Secondary | ICD-10-CM | POA: Diagnosis present

## 2022-05-08 MED ORDER — IOHEXOL 300 MG/ML  SOLN
100.0000 mL | Freq: Once | INTRAMUSCULAR | Status: AC | PRN
  Administered 2022-05-08: 100 mL via INTRAVENOUS

## 2022-05-08 MED ORDER — SODIUM CHLORIDE (PF) 0.9 % IJ SOLN
INTRAMUSCULAR | Status: AC
  Filled 2022-05-08: qty 50

## 2022-05-09 ENCOUNTER — Other Ambulatory Visit (HOSPITAL_COMMUNITY): Payer: Self-pay

## 2022-05-11 ENCOUNTER — Other Ambulatory Visit (HOSPITAL_COMMUNITY): Payer: Self-pay

## 2022-05-18 ENCOUNTER — Inpatient Hospital Stay (HOSPITAL_BASED_OUTPATIENT_CLINIC_OR_DEPARTMENT_OTHER): Payer: Medicare HMO | Admitting: Hematology and Oncology

## 2022-05-18 ENCOUNTER — Inpatient Hospital Stay: Payer: Medicare HMO | Attending: Hematology and Oncology

## 2022-05-18 ENCOUNTER — Ambulatory Visit
Admission: RE | Admit: 2022-05-18 | Discharge: 2022-05-18 | Disposition: A | Discharge status: Home / Self Care | Payer: Medicare HMO | Source: Ambulatory Visit | Attending: Family Medicine | Admitting: Family Medicine

## 2022-05-18 ENCOUNTER — Other Ambulatory Visit: Payer: Self-pay

## 2022-05-18 DIAGNOSIS — Z170 Estrogen receptor positive status [ER+]: Secondary | ICD-10-CM | POA: Diagnosis not present

## 2022-05-18 DIAGNOSIS — C50412 Malignant neoplasm of upper-outer quadrant of left female breast: Secondary | ICD-10-CM

## 2022-05-18 DIAGNOSIS — Z79811 Long term (current) use of aromatase inhibitors: Secondary | ICD-10-CM | POA: Diagnosis not present

## 2022-05-18 DIAGNOSIS — Z1231 Encounter for screening mammogram for malignant neoplasm of breast: Secondary | ICD-10-CM

## 2022-05-18 LAB — CMP (CANCER CENTER ONLY)
ALT: 7 U/L (ref 0–44)
AST: 16 U/L (ref 15–41)
Albumin: 4.3 g/dL (ref 3.5–5.0)
Alkaline Phosphatase: 115 U/L (ref 38–126)
Anion gap: 6 (ref 5–15)
BUN: 12 mg/dL (ref 8–23)
CO2: 23 mmol/L (ref 22–32)
Calcium: 9.4 mg/dL (ref 8.9–10.3)
Chloride: 108 mmol/L (ref 98–111)
Creatinine: 0.86 mg/dL (ref 0.44–1.00)
GFR, Estimated: >60 mL/min (ref >60)
Glucose, Bld: 160 mg/dL — ABNORMAL HIGH (ref 70–99)
Potassium: 4.3 mmol/L (ref 3.5–5.1)
Sodium: 137 mmol/L (ref 135–145)
Total Bilirubin: 0.6 mg/dL (ref 0.3–1.2)
Total Protein: 7.2 g/dL (ref 6.5–8.1)

## 2022-05-18 LAB — CBC WITH DIFFERENTIAL (CANCER CENTER ONLY)
Abs Immature Granulocytes: 0.07 10*3/uL (ref 0.00–0.07)
Basophils Absolute: 0 10*3/uL (ref 0.0–0.1)
Basophils Relative: 1 %
Eosinophils Absolute: 0 10*3/uL (ref 0.0–0.5)
Eosinophils Relative: 1 %
HCT: 39.1 % (ref 36.0–46.0)
Hemoglobin: 13.6 g/dL (ref 12.0–15.0)
Immature Granulocytes: 2 %
Lymphocytes Relative: 26 %
Lymphs Abs: 1.2 10*3/uL (ref 0.7–4.0)
MCH: 34.3 pg — ABNORMAL HIGH (ref 26.0–34.0)
MCHC: 34.8 g/dL (ref 30.0–36.0)
MCV: 98.5 fL (ref 80.0–100.0)
Monocytes Absolute: 0.8 10*3/uL (ref 0.1–1.0)
Monocytes Relative: 17 %
Neutro Abs: 2.5 10*3/uL (ref 1.7–7.7)
Neutrophils Relative %: 53 %
Platelet Count: 171 10*3/uL (ref 150–400)
RBC: 3.97 MIL/uL (ref 3.87–5.11)
RDW: 15.6 % — ABNORMAL HIGH (ref 11.5–15.5)
WBC Count: 4.7 10*3/uL (ref 4.0–10.5)
nRBC: 0 % (ref 0.0–0.2)

## 2022-05-18 NOTE — Assessment & Plan Note (Signed)
03/22/2020:diffuse left breast swelling. skin thickening and two areas of irregular hypoechogenicity in the 2-3 o'clock region, 4.9cm, and at least 4 abnormal lymph nodes with cortical thickening. 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, grade 2, HER-2 negative (1+), ER+ 30%, PR+ 10%, Ki67 15% T4BN1 stage IIIb clinical stage Skin invasion versus inflammatory breast cancer  Treatment plan: 1.Neoadjuvant chemotherapywith CMF 2.09/14/2020:Left mastectomy ([name]): invasive and in situ ductal carcinoma, grade 2, 15cm, clear margins, with 14/14 lymph nodes positive for metastatic carcinoma with extracapsular extension.ER 30%, PR 10%, HER-2 negative, Ki-67 15% 3.Adjuvant radiation 10/28/2020-12/17/2020 4.Followed by adjuvant antiestrogen therapywith anastrozole started 01/11/2021 ------------------------------------------------------------------------------------------------------------------------------------------- CT chest abdomen pelvis: 12/07/2021: New enhancing lesion liver dome 3.2centimeters, left kidney hemorrhagic cyst (likely benign), unchanged groundglass opacity right lower lobe 0.6 cm (nonspecific)  Liver biopsy 12/26/2021: Consistent with metastatic breast cancer (CK7 positive, CK20 positive, GA TA-3 positive) ER 0%, PR 0%, HER2 1+ by IHC (negative)  Current treatment: Capecitabine 1000 mg p.o. twice daily 2 weeks on 1 week off started 01/11/2022 Capecitabine toxicities: 1. Dark discoloration of the fingers: She is currently on 1000 mg p.o. twice daily.  2. Denies any diarrhea  CT CAP 05/08/2022: Liver the lesion diminished in size 2.1 cm (was 3.2 cm), multiple new sclerotic nondisplaced fractures of anterior left third through seventh ribs, unchanged chronic sclerotic fractures left 11th and 12th ribs.  Based on excellent response to treatment we will continue with the same treatment and rescan in 6 months.

## 2022-05-18 NOTE — Progress Notes (Signed)
Patient Care Team: [name], MD as PCP - General (Family Medicine) Kyung Rudd, MD as Consulting Physician (Radiation Oncology) Nicholas Lose, MD as Consulting Physician (Hematology and Oncology) Erroll Luna, MD as Consulting Physician (General Surgery)  DIAGNOSIS:  Encounter Diagnosis  Name Primary?   Malignant neoplasm of upper-outer quadrant of left breast in female, estrogen receptor positive ([place])     SUMMARY OF ONCOLOGIC HISTORY: Oncology History  Malignant neoplasm of upper-outer quadrant of left breast in female, estrogen receptor positive ([place])  03/22/2020 Initial Diagnosis   Diffuse left breast swelling. skin thickening and two areas of irregular hypoechogenicity in the 2-3 o'clock region, 4.9cm, and at least 4 abnormal lymph nodes  with cortical thickening. 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    10/28/2020 - 12/17/2020 Radiation Therapy   The patient initially received a dose of 50.4 Gy in 28 fractions to the left breast and supraclavicular region using whole-breast tangent fields. This was delivered using a 3-D conformal technique. The pt received a boost delivering an additional 10 Gy in 5 fractions using a electron boost with 76mV electrons. The total dose was 60.4 Gy.   11/2020 - 11/2027 Anti-estrogen oral therapy   Anastrozole   01/30/2022 Miscellaneous   Guardant360:ERBB2 (exon 20 insertion): Benefit from Enhertu, MSI high not detected     CHIEF COMPLIANT: left  breast cancer on Xeloda  INTERVAL HISTORY: Colleen TRUBYis a 81y.o. with above-mentioned history of left breast cancer. She presents to the clinic today for a follow-up. Denies falling but she stomp her toe on a table. Denies hurting her ribs. Denies pain. Denies diarrhea and nausea.   ALLERGIES:  is allergic to alcohol, contrast media [iodinated contrast media], iodine, atorvastatin, dust mite extract, latex, lisinopril, and molds & smuts.  MEDICATIONS:  Current Outpatient Medications  Medication Sig Dispense Refill   ADRENAL CORTEX PO Take 4 capsules by mouth daily.     alendronate (FOSAMAX) 70 MG tablet Take 1 tablet (70 mg total) by mouth once a week. Take with a full glass of water on an empty stomach. (Patient taking differently: Take 70 mg by mouth every Sunday. Take with a full glass of water on an empty stomach.) 12 tablet 3   Ascorbic Acid (VITAMIN C PO) Take 2,000 mg by mouth daily.     B Complex-Folic Acid (B COMPLEX PLUS PO) Take 4 tablets by mouth daily.     Beta Carotene (VITAMIN A) 25000 UNIT capsule Take 50,000 Units by mouth daily.     CALCIUM PO Take 10 mLs by mouth daily. Liquid 300 MG /5 ML     capecitabine (XELODA) 500 MG tablet Take 2 tablets (1,000 mg total) by mouth 2 (two) times daily after a meal. 14 days on and 7 days off 56 tablet 6   Cholecalciferol (VITAMIN D3) 5000 units CAPS Take 5,000 Units by mouth daily. 2500 units per cap     Flaxseed, Linseed, (FLAXSEED OIL) 1000 MG CAPS Take 3,000 mg by  mouth daily. Taking as needed     furosemide (LASIX) 20 MG tablet Take 20 mg by mouth daily.     Homeopathic Products (ARNICARE PAIN RELIEF EX) Apply 1 application topically daily as needed (pain).     losartan (COZAAR) 50 MG tablet Take 50 mg by mouth daily.     MAGNESIUM PO Take 5 mLs by mouth daily. Liquid 2.5 ml/150 mg     Misc Natural Products (BLOOD SUGAR BALANCE PO) Take 3 capsules by mouth daily. Blood Sugar Manager     OVER THE COUNTER MEDICATION Take 1 Scoop  by mouth daily. Branched chain amino acids 4000  Patient states "taking for muscles"     OVER THE COUNTER MEDICATION Take 400-800 mg by mouth daily as needed (Based on Blood glucose). gymnema sylvestre Patient takes for "lowering blood glucose"     OVER THE COUNTER MEDICATION Take 5 capsules by mouth daily. Pau d' arco 500 mg each  Patient states now taking liquid and "keeps mold down"     OVER THE COUNTER MEDICATION Take 6 capsules by mouth daily. Caprylic acid Patient taking to "lower candida risk"     OVER THE COUNTER MEDICATION Take 5 capsules by mouth daily. Pancreatic enzymes Patient taking "to get fluid out"     OVER THE COUNTER MEDICATION Take 3 capsules by mouth daily. Raw Adrenal Patient taking for "overall energy"     OVER THE COUNTER MEDICATION Take 750 mg by mouth daily. Gaba Supplement Patient taking for "seizures"     OVER THE COUNTER MEDICATION Take 6 capsules by mouth daily. Beef Pancreas Patient taking for "pancreatic function improvement and lymphatic system improvement"     PANTETHINE PO Take 1-2 tablets by mouth daily as needed (Itching). Vitamin B5     potassium chloride SA (K-DUR,KLOR-CON) 20 MEQ tablet Take 20 mEq by mouth 2 (two) times daily.     predniSONE (DELTASONE) 50 MG tablet Take 1 tab by mouth 13 hours, 7 hours, and 1 hour prior to CT scan.  Take diphenhydramine 50 mg by mouth 1 hour prior to CT scan 3 tablet 0   Propylene Glycol (SYSTANE BALANCE) 0.6 % SOLN Place 2 drops into the right eye in the morning and at bedtime. (Patient not taking: Reported on 01/24/2022)     No current facility-administered medications for this visit.    PHYSICAL EXAMINATION: ECOG PERFORMANCE STATUS: 1 - Symptomatic but completely ambulatory  Vitals:   05/18/22 1028  BP: (!) 179/82  Pulse: 84  Resp: 19  Temp: 97.8 F (36.6 C)  SpO2: 100%   Filed Weights   05/18/22 1028  Weight: 140 lb 6.4 oz (63.7 kg)      LABORATORY DATA:  I have reviewed the data as listed     Latest Ref Rng & Units 04/24/2022   10:20 AM 04/03/2022   12:30 PM 03/13/2022   11:07 AM  CMP  Glucose 70 - 99 mg/dL 184  254  172   BUN 8 - 23 mg/dL _0 Creatinine 0.44 - 1.00 mg/dL 0.95  1.02  0.88   Sodium 135 - 145 mmol/L 137  134  136   Potassium 3.5 - 5.1 mmol/L 4.0  3.6  4.5   Chloride 98 - 111 mmol/L 104  102  109   CO2 22 - 32 mmol/L _1 Calcium 8.9 - 10.3 mg/dL 9.6  9.6  9.1   Total Protein 6.5 - 8.1 g/dL  6.8  7.2  7.0   Total Bilirubin 0.3 - 1.2 mg/dL 0.6  0.4  0.5   Alkaline Phos 38 - 126 U/L 106  111  106   AST 15 - 41 U/L _0 ALT 0 - 44 U/L _1 Lab Results  Component Value Date   WBC 4.7 05/18/2022   HGB 13.6 05/18/2022   HCT 39.1 05/18/2022   MCV 98.5 05/18/2022   PLT 171 05/18/2022   NEUTROABS 2.5 05/18/2022    ASSESSMENT & PLAN:  Malignant neoplasm of upper-outer quadrant of left breast in female, estrogen receptor positive ([place]) 03/22/2020:diffuse left breast swelling. skin thickening and two areas of irregular hypoechogenicity in the 2-3 o'clock region, 4.9cm, and at least 4 abnormal lymph nodes  with cortical thickening. 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  Neoadjuvant chemotherapy with CMF 2.  09/14/2020:Left mastectomy ([name]): invasive and in situ ductal carcinoma, grade 2, 15cm, clear margins, with 14/14 lymph nodes positive for metastatic carcinoma with extracapsular extension.  ER 30%, PR 10%, HER-2 negative, Ki-67 15% 3.  Adjuvant radiation 10/28/2020-12/17/2020 4.  Followed by adjuvant antiestrogen therapy with anastrozole started 01/11/2021 ------------------------------------------------------------------------------------------------------------------------------------------- CT chest abdomen pelvis: 12/07/2021: New enhancing  lesion liver dome 3.2 centimeters, left kidney hemorrhagic cyst (likely benign), unchanged groundglass opacity right lower lobe 0.6 cm (nonspecific)   Liver biopsy 12/26/2021: Consistent with metastatic breast cancer (CK7 positive, CK20 positive, GA TA-3 positive) ER 0%, PR 0%, HER2 1+ by IHC (negative)   Current treatment: Capecitabine 1000 mg p.o. twice daily 2 weeks on 1 week off started 01/11/2022 Capecitabine toxicities: Dark discoloration of the fingers: She is currently on 1000 mg p.o. twice daily.  Denies any diarrhea  CT CAP 05/08/2022: Liver the lesion diminished in size 2.1 cm (was 3.2 cm), multiple new sclerotic nondisplaced fractures of anterior left third through seventh ribs, unchanged chronic sclerotic fractures left 11th and 12th ribs.  Based on excellent response to treatment we will continue with the same treatment and rescan in 6 months. Her labs are excellent and therefore we do not need to do monthly labs anymore.  She is coming back to see Jenny Reichmann in September.  We discussed the role of bisphosphonate therapy and because of her dental issues we are not starting her on any bisphosphonates at this time.  If she gets her teeth extracted we could consider that in the future.  No orders of the defined types were placed in this encounter.  The patient has a good understanding of the overall plan. she agrees with it. she will call with any problems that may develop before the next visit here. Total time spent: 30 mins including face to face time and time spent for planning, charting and co-ordination of care   Harriette Ohara, MD 05/18/22

## 2022-05-19 LAB — CANCER ANTIGEN 27.29: CA 27.29: 31.3 U/mL (ref 0.0–38.6)

## 2022-05-24 ENCOUNTER — Other Ambulatory Visit: Payer: Self-pay | Admitting: *Deleted

## 2022-05-24 DIAGNOSIS — C50412 Malignant neoplasm of upper-outer quadrant of left female breast: Secondary | ICD-10-CM

## 2022-05-24 MED ORDER — ALENDRONATE SODIUM 70 MG PO TABS: 70.0000 mg | ORAL_TABLET | ORAL | 3 refills | Status: DC

## 2022-05-30 ENCOUNTER — Other Ambulatory Visit (HOSPITAL_COMMUNITY): Payer: Self-pay

## 2022-06-05 ENCOUNTER — Other Ambulatory Visit (HOSPITAL_COMMUNITY): Payer: Self-pay

## 2022-06-05 ENCOUNTER — Inpatient Hospital Stay: Payer: Medicare HMO

## 2022-06-20 ENCOUNTER — Other Ambulatory Visit (HOSPITAL_COMMUNITY): Payer: Self-pay

## 2022-06-26 ENCOUNTER — Inpatient Hospital Stay: Payer: Medicare HMO

## 2022-06-27 ENCOUNTER — Other Ambulatory Visit (HOSPITAL_COMMUNITY): Payer: Self-pay

## 2022-07-18 ENCOUNTER — Inpatient Hospital Stay: Payer: Medicare HMO | Attending: Hematology and Oncology

## 2022-07-18 ENCOUNTER — Other Ambulatory Visit: Payer: Self-pay

## 2022-07-18 ENCOUNTER — Inpatient Hospital Stay: Payer: Medicare HMO | Admitting: Pharmacist

## 2022-07-18 VITALS — BP 174/80 | HR 78 | Temp 97.7°F | Resp 18 | Ht 62.0 in | Wt 135.2 lb

## 2022-07-18 DIAGNOSIS — Z170 Estrogen receptor positive status [ER+]: Secondary | ICD-10-CM | POA: Diagnosis not present

## 2022-07-18 DIAGNOSIS — Z79811 Long term (current) use of aromatase inhibitors: Secondary | ICD-10-CM | POA: Diagnosis not present

## 2022-07-18 DIAGNOSIS — C50412 Malignant neoplasm of upper-outer quadrant of left female breast: Secondary | ICD-10-CM | POA: Diagnosis present

## 2022-07-18 DIAGNOSIS — L271 Localized skin eruption due to drugs and medicaments taken internally: Secondary | ICD-10-CM | POA: Insufficient documentation

## 2022-07-18 LAB — CBC WITH DIFFERENTIAL (CANCER CENTER ONLY)
Abs Immature Granulocytes: 0.06 10*3/uL (ref 0.00–0.07)
Basophils Absolute: 0 10*3/uL (ref 0.0–0.1)
Basophils Relative: 1 %
Eosinophils Absolute: 0.1 10*3/uL (ref 0.0–0.5)
Eosinophils Relative: 2 %
HCT: 39.5 % (ref 36.0–46.0)
Hemoglobin: 13.9 g/dL (ref 12.0–15.0)
Immature Granulocytes: 1 %
Lymphocytes Relative: 22 %
Lymphs Abs: 1.2 10*3/uL (ref 0.7–4.0)
MCH: 34.9 pg — ABNORMAL HIGH (ref 26.0–34.0)
MCHC: 35.2 g/dL (ref 30.0–36.0)
MCV: 99.2 fL (ref 80.0–100.0)
Monocytes Absolute: 0.7 10*3/uL (ref 0.1–1.0)
Monocytes Relative: 12 %
Neutro Abs: 3.6 10*3/uL (ref 1.7–7.7)
Neutrophils Relative %: 62 %
Platelet Count: 156 10*3/uL (ref 150–400)
RBC: 3.98 MIL/uL (ref 3.87–5.11)
RDW: 14.4 % (ref 11.5–15.5)
WBC Count: 5.7 10*3/uL (ref 4.0–10.5)
nRBC: 0 % (ref 0.0–0.2)

## 2022-07-18 LAB — CMP (CANCER CENTER ONLY)
ALT: 6 U/L (ref 0–44)
AST: 15 U/L (ref 15–41)
Albumin: 4.4 g/dL (ref 3.5–5.0)
Alkaline Phosphatase: 107 U/L (ref 38–126)
Anion gap: 8 (ref 5–15)
BUN: 17 mg/dL (ref 8–23)
CO2: 21 mmol/L — ABNORMAL LOW (ref 22–32)
Calcium: 10.4 mg/dL — ABNORMAL HIGH (ref 8.9–10.3)
Chloride: 110 mmol/L (ref 98–111)
Creatinine: 0.93 mg/dL (ref 0.44–1.00)
GFR, Estimated: >60 mL/min (ref >60)
Glucose, Bld: 176 mg/dL — ABNORMAL HIGH (ref 70–99)
Potassium: 3.8 mmol/L (ref 3.5–5.1)
Sodium: 139 mmol/L (ref 135–145)
Total Bilirubin: 0.6 mg/dL (ref 0.3–1.2)
Total Protein: 7.2 g/dL (ref 6.5–8.1)

## 2022-07-18 NOTE — Progress Notes (Signed)
[place]       Telephone: [phone]?Fax: [phone]   Oncology Clinical Pharmacist Practitioner Progress Note  Colleen Lowery was contacted via in person visit to discuss her chemotherapy regimen for capecitabine which they receive under the care of Dr. Nicholas Lose.    Current treatment regimen and start date Capecitabine (01/12/22)   Interval History She continues on capecitabine 2 tablets (1000 mg) in the morning and 2 tablets (1000 mg) in the evening on days 1 to 14 of a 21-day cycle. This is being given monotherapy. Therapy is planned to continue until disease progression or unacceptable toxicity.  Colleen Lowery is here today as a follow-up to her capecitabine management.  She was last seen by Dr. Lindi Adie on 05/18/22 and clinical pharmacy on 04/03/22.  She had restaging scans on 05/08/22 which showed a good response.  Dr. Lindi Adie does scans for her every 6 months.  Response to Therapy Colleen Lowery is doing well.  She continues on her current dose of capecitabine and today states that she is on day 2 of her off week.  She continues to have some discoloring of her hands and feet but feels that it is manageable.  We did discuss using utterly smooth with urea and showed her some examples online of where to order.  She states that she will consider this.  She continues to take several supplements and she knows from prior discussions that its difficult to be certain about potential interactions with capecitabine and these herbal supplements.  Her blood pressure is elevated today but she does take thorough notes at home regarding her blood pressure readings.  Her blood pressure reading today at home per her report was 145/70.  Her calcium level today is estimated at 10.4 mg/dL which is just over the upper limit of normal.  She does take calcium at home and will start to take this less frequently.  Colleen Lowery states that her primary care physician is Dr. Lavera Guise and she will be making a  appointment to see her for her annual appointment.  Colleen Lowery states that it has been sometime since she saw her last.  We confirmed today that Colleen Lowery continues to take alendronate as she has had bone density scans showing osteoporosis in the past.  This Sta[place] states that she cannot see the dentist because of her allergies.  We did state that since she is on a bisphosphonate, it would be recommended that she be followed by a dentist due to the risk of osteonecrosis of the jaw with bisphosphonates.  Clinical pharmacy will see Colleen Lowery again in November and at that time she will likely have restaging scans ordered for the end of December when she will see Dr. Lindi Adie.  Colleen Lowery does have a known contrast allergy but has taken prednisone and diphenhydramine in the past without issues. We will likely also order a bone density scan at that time as well.  CA 27.29 is not back yet but will probably be reported out to Dr. Lindi Adie sometime this week.  Labs, vitals, treatment parameters, and manufacturer guidelines assessing toxicity were reviewed with Colleen Lowery today. Based on these values, patient is in agreement to continue therapy at this time.  Allergies Allergies  Allergen Reactions   Alcohol Anaphylaxis and Swelling    Purell hand sanitizer  Swelling in the luNGS, CHEST AND THROAT and seizures Problem with smell not the Alcohol Other reaction(s): Other (See Comments) Causes seizures and  swelling    Contrast Media [Iodinated Contrast Media] Anaphylaxis    Throat closed, was 40 yrs ago  *Iodine    Iodine Anaphylaxis    Throat closed, was 40 yrs ago   Atorvastatin     Myalgia    Dust Mite Extract Swelling    SWELLING IS GI SYSTEM   Latex     QUESTIONABLE INTOLERABLE MUCOUS OOZING OUT OF THROAT   Lisinopril Other (See Comments)    Pt does not remember reaction    Molds & Smuts Swelling    SWELLING IS IN GI SYSTEM    Vitals    07/18/2022   10:58 AM 05/18/2022   10:28 AM 04/03/2022     1:30 PM  Vitals with BMI  Height 5' 2"  5' 2.5"   Weight 135 lbs 3 oz 140 lbs 6 oz   BMI 28.00 34.91   Systolic 791 505 697  Diastolic 80 82 79  Pulse 78 84    Temp Readings from Last 3 Encounters:  07/18/22 97.7 F (36.5 C) (Temporal)  05/18/22 97.8 F (36.6 C) (Temporal)  04/03/22 97.7 F (36.5 C) (Tympanic)    Laboratory Data    Latest Ref Rng & Units 07/18/2022   10:38 AM 05/18/2022   10:14 AM 04/24/2022   10:20 AM  CBC EXTENDED  WBC 4.0 - 10.5 K/uL 5.7  4.7  5.1   RBC 3.87 - 5.11 MIL/uL 3.98  3.97  4.28   Hemoglobin 12.0 - 15.0 g/dL 13.9  13.6  14.1   HCT 36.0 - 46.0 % 39.5  39.1  40.9   Platelets 150 - 400 K/uL 156  171  164   NEUT# 1.7 - 7.7 K/uL 3.6  2.5  3.1   Lymph# 0.7 - 4.0 K/uL 1.2  1.2  1.2        Latest Ref Rng & Units 07/18/2022   10:38 AM 05/18/2022   10:14 AM 04/24/2022   10:20 AM  CMP  Glucose 70 - 99 mg/dL 176  160  184   BUN 8 - 23 mg/dL 17  12  17    Creatinine 0.44 - 1.00 mg/dL 0.93  0.86  0.95   Sodium 135 - 145 mmol/L 139  137  137   Potassium 3.5 - 5.1 mmol/L 3.8  4.3  4.0   Chloride 98 - 111 mmol/L 110  108  104   CO2 22 - 32 mmol/L 21  23  25    Calcium 8.9 - 10.3 mg/dL 10.4  9.4  9.6   Total Protein 6.5 - 8.1 g/dL 7.2  7.2  6.8   Total Bilirubin 0.3 - 1.2 mg/dL 0.6  0.6  0.6   Alkaline Phos 38 - 126 U/L 107  115  106   AST 15 - 41 U/L 15  16  13    ALT 0 - 44 U/L 6  7  7      Lab Results  Component Value Date   MG 1.9 05/09/2016   MG 1.9 02/23/2016   MG 1.9 02/07/2016     Adverse Effects Assessment Palmar-plantar erythrodysesthesia: Stable.  She will consider using utterly smooth with urea.  We did give her some different sites but she could order this.  She does continue to use different topical creams effectively. Calcium: Slightly elevated today at 10.4 mg/dL.  Will monitor.  She will also start to take a calcium supplement less frequently.  We recommended every other day dosing at this time. Blood pressure: Continues to be  elevated when  she is in clinic but she does monitor her blood pressure at home and today she reports it being 145/70.  Adherence Assessment Colleen Lowery reports missing 0 doses over the past 4 weeks.   Reason for missed dose: N/A Patient was re-educated on importance of adherence.   Access Assessment Colleen Lowery is currently receiving her capecitabine through [facility]. Insurance concerns: None  Medication Reconciliation The patient's medication list was reviewed today with the patient?  Yes New medications or herbal supplements have recently been started?  Colleen Lowery states that she is now taking a beef pancreas supplement to help with water weight.  She manages all her herbal supplements on her own and knows the potential risk for interactions with her current medication list including capecitabine.  She understands the risk. Any medications have been discontinued?  No The medication list was updated and reconciled based on the patient's most recent medication list in the electronic medical record (EMR) including herbal products and OTC medications.   Medications Current Outpatient Medications  Medication Sig Dispense Refill   ADRENAL CORTEX PO Take 4 capsules by mouth daily.     alendronate (FOSAMAX) 70 MG tablet Take 1 tablet (70 mg total) by mouth once a week. Take with a full glass of water on an empty stomach. 12 tablet 3   Ascorbic Acid (VITAMIN C PO) Take 2,000 mg by mouth daily.     B Complex-Folic Acid (B COMPLEX PLUS PO) Take 4 tablets by mouth daily.     Beta Carotene (VITAMIN A) 25000 UNIT capsule Take 50,000 Units by mouth daily.     CALCIUM PO Take 10 mLs by mouth daily. Liquid 300 MG /5 ML     capecitabine (XELODA) 500 MG tablet Take 2 tablets (1,000 mg total) by mouth 2 (two) times daily after a meal. 14 days on and 7 days off 56 tablet 6   Cholecalciferol (VITAMIN D3) 5000 units CAPS Take 5,000 Units by mouth daily. 2500 units per cap     Flaxseed, Linseed,  (FLAXSEED OIL) 1000 MG CAPS Take 3,000 mg by mouth daily. Taking as needed     furosemide (LASIX) 20 MG tablet Take 20 mg by mouth daily.     Homeopathic Products (ARNICARE PAIN RELIEF EX) Apply 1 application topically daily as needed (pain).     losartan (COZAAR) 50 MG tablet Take 50 mg by mouth daily.     MAGNESIUM PO Take 5 mLs by mouth daily. Liquid 2.5 ml/150 mg     Misc Natural Products (BLOOD SUGAR BALANCE PO) Take 3 capsules by mouth daily. Blood Sugar Manager     OVER THE COUNTER MEDICATION Take 1 Scoop by mouth daily. Branched chain amino acids 4000  Patient states "taking for muscles"     OVER THE COUNTER MEDICATION Take 400-800 mg by mouth daily as needed (Based on Blood glucose). gymnema sylvestre Patient takes for "lowering blood glucose"     OVER THE COUNTER MEDICATION Take 5 capsules by mouth daily. Pau d' arco 500 mg each  Patient states now taking liquid and "keeps mold down"     OVER THE COUNTER MEDICATION Take 6 capsules by mouth daily. Caprylic acid Patient taking to "lower candida risk"     OVER THE COUNTER MEDICATION Take 5 capsules by mouth daily. Pancreatic enzymes Patient taking "to get fluid out"     OVER THE COUNTER MEDICATION Take 3 capsules by mouth daily. Raw Adrenal Patient taking for "  overall energy"     OVER THE COUNTER MEDICATION Take 750 mg by mouth daily. Gaba Supplement Patient taking for "seizures"     OVER THE COUNTER MEDICATION Take 6 capsules by mouth daily. Beef Pancreas Patient taking for "pancreatic function improvement and lymphatic system improvement"     PANTETHINE PO Take 1-2 tablets by mouth daily as needed (Itching). Vitamin B5     potassium chloride SA (K-DUR,KLOR-CON) 20 MEQ tablet Take 20 mEq by mouth 2 (two) times daily.     predniSONE (DELTASONE) 50 MG tablet Take 1 tab by mouth 13 hours, 7 hours, and 1 hour prior to CT scan.  Take diphenhydramine 50 mg by mouth 1 hour prior to CT scan 3 tablet 0   Propylene Glycol (SYSTANE BALANCE)  0.6 % SOLN Place 2 drops into the right eye in the morning and at bedtime. (Patient not taking: Reported on 01/24/2022)     No current facility-administered medications for this visit.    Drug-Drug Interactions (DDIs) DDIs were evaluated?  Yes Significant DDIs?  No drug to drug interactions identified but as above difficult to assess due to multiple herbal supplements that are not FDA regulated.  Difficult to know if interactions may exist and Colleen Lowery understands this risk. The patient was instructed to speak with their health care provider and/or the oral chemotherapy pharmacist before starting any new drug, including prescription or over the counter, natural / herbal products, or vitamins.  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  She may also start utterly smooth with urea.  Information given today.  Dosing Assessment Hepatic adjustments needed?  No Renal adjustments needed?  No Toxicity adjustments needed?  No The current dosing regimen is appropriate to continue at this time.  Follow-Up Plan Continue capecitabine 1000 mg by mouth every 12 hours for 14 days, followed by a 7-day rest period. Monitor calcium.  Today 10.4 mg/dL and just over upper limit of normal.  She will adjust her calcium supplement to 3 other day frequency. Labs, pharmacy clinic visit, and November.  At that time we will likely order restaging scans with a bone density scan as Colleen Lowery will see Dr. Lindi Adie at the end of December to review those scans. Labs, Dr. Lindi Adie visit, on 11/11/22  Colleen Lowery participated in the discussion, expressed understanding, and voiced agreement with the above plan. All questions were answered to her satisfaction. The patient was advised to contact the clinic at [phone] with any questions or concerns prior to her return visit.   I spent 30 minutes assessing and educating the patient.  Raina Mina, RPH-CPP,  07/18/2022  11:39 AM   **Disclaimer: This note was dictated with voice recognition software. Similar sounding words can inadvertently be transcribed and this note may contain transcription errors which may not have been corrected upon publication of note.**

## 2022-07-19 ENCOUNTER — Other Ambulatory Visit (HOSPITAL_COMMUNITY): Payer: Self-pay

## 2022-07-19 LAB — CANCER ANTIGEN 27.29: CA 27.29: 36.8 U/mL (ref 0.0–38.6)

## 2022-07-21 ENCOUNTER — Other Ambulatory Visit (HOSPITAL_COMMUNITY): Payer: Self-pay

## 2022-08-02 ENCOUNTER — Other Ambulatory Visit (HOSPITAL_COMMUNITY): Payer: Self-pay

## 2022-08-02 ENCOUNTER — Other Ambulatory Visit: Payer: Self-pay | Admitting: Hematology and Oncology

## 2022-08-02 MED ORDER — CAPECITABINE 500 MG PO TABS
1000.0000 mg | ORAL_TABLET | Freq: Two times a day (BID) | ORAL | 6 refills | Status: DC
  Filled 2022-08-02: qty 56, 21d supply, fill #0
  Filled 2022-08-22: qty 56, 21d supply, fill #1
  Filled 2022-09-12: qty 56, 21d supply, fill #2
  Filled 2022-10-26: qty 56, 21d supply, fill #3

## 2022-08-03 ENCOUNTER — Other Ambulatory Visit (HOSPITAL_COMMUNITY): Payer: Self-pay

## 2022-08-04 ENCOUNTER — Other Ambulatory Visit (HOSPITAL_COMMUNITY): Payer: Self-pay

## 2022-08-07 ENCOUNTER — Other Ambulatory Visit (HOSPITAL_COMMUNITY): Payer: Self-pay

## 2022-08-22 ENCOUNTER — Other Ambulatory Visit (HOSPITAL_COMMUNITY): Payer: Self-pay

## 2022-08-29 ENCOUNTER — Other Ambulatory Visit (HOSPITAL_COMMUNITY): Payer: Self-pay

## 2022-09-12 ENCOUNTER — Other Ambulatory Visit (HOSPITAL_COMMUNITY): Payer: Self-pay

## 2022-09-15 ENCOUNTER — Other Ambulatory Visit (HOSPITAL_COMMUNITY): Payer: Self-pay

## 2022-09-19 ENCOUNTER — Other Ambulatory Visit (HOSPITAL_COMMUNITY): Payer: Self-pay

## 2022-09-25 ENCOUNTER — Other Ambulatory Visit (HOSPITAL_COMMUNITY): Payer: Self-pay

## 2022-09-26 ENCOUNTER — Inpatient Hospital Stay: Payer: Medicare HMO | Attending: Hematology and Oncology

## 2022-09-26 ENCOUNTER — Other Ambulatory Visit: Payer: Self-pay

## 2022-09-26 ENCOUNTER — Inpatient Hospital Stay: Payer: Medicare HMO | Admitting: Pharmacist

## 2022-09-26 DIAGNOSIS — C50412 Malignant neoplasm of upper-outer quadrant of left female breast: Secondary | ICD-10-CM | POA: Insufficient documentation

## 2022-09-26 DIAGNOSIS — Z170 Estrogen receptor positive status [ER+]: Secondary | ICD-10-CM | POA: Insufficient documentation

## 2022-09-26 LAB — CBC WITH DIFFERENTIAL (CANCER CENTER ONLY)
Abs Immature Granulocytes: 0.03 10*3/uL (ref 0.00–0.07)
Basophils Absolute: 0 10*3/uL (ref 0.0–0.1)
Basophils Relative: 1 %
Eosinophils Absolute: 0.1 10*3/uL (ref 0.0–0.5)
Eosinophils Relative: 2 %
HCT: 37.9 % (ref 36.0–46.0)
Hemoglobin: 13 g/dL (ref 12.0–15.0)
Immature Granulocytes: 1 %
Lymphocytes Relative: 22 %
Lymphs Abs: 1.1 10*3/uL (ref 0.7–4.0)
MCH: 34 pg (ref 26.0–34.0)
MCHC: 34.3 g/dL (ref 30.0–36.0)
MCV: 99.2 fL (ref 80.0–100.0)
Monocytes Absolute: 0.6 10*3/uL (ref 0.1–1.0)
Monocytes Relative: 14 %
Neutro Abs: 2.8 10*3/uL (ref 1.7–7.7)
Neutrophils Relative %: 60 %
Platelet Count: 165 10*3/uL (ref 150–400)
RBC: 3.82 MIL/uL — ABNORMAL LOW (ref 3.87–5.11)
RDW: 14.6 % (ref 11.5–15.5)
WBC Count: 4.7 10*3/uL (ref 4.0–10.5)
nRBC: 0 % (ref 0.0–0.2)

## 2022-09-26 LAB — CMP (CANCER CENTER ONLY)
ALT: 6 U/L (ref 0–44)
AST: 15 U/L (ref 15–41)
Albumin: 4.2 g/dL (ref 3.5–5.0)
Alkaline Phosphatase: 115 U/L (ref 38–126)
Anion gap: 7 (ref 5–15)
BUN: 11 mg/dL (ref 8–23)
CO2: 23 mmol/L (ref 22–32)
Calcium: 9 mg/dL (ref 8.9–10.3)
Chloride: 107 mmol/L (ref 98–111)
Creatinine: 0.8 mg/dL (ref 0.44–1.00)
GFR, Estimated: >60 mL/min (ref >60)
Glucose, Bld: 166 mg/dL — ABNORMAL HIGH (ref 70–99)
Potassium: 3.9 mmol/L (ref 3.5–5.1)
Sodium: 137 mmol/L (ref 135–145)
Total Bilirubin: 0.7 mg/dL (ref 0.3–1.2)
Total Protein: 7 g/dL (ref 6.5–8.1)

## 2022-09-26 MED ORDER — PREDNISONE 50 MG PO TABS: ORAL_TABLET | ORAL | 0 refills | Status: DC

## 2022-09-26 NOTE — Progress Notes (Signed)
[place]       Telephone: [phone]?Fax: [phone]   Oncology Clinical Pharmacist Practitioner Progress Note  Colleen Lowery was contacted via in person visit to discuss her chemotherapy regimen for capecitabine which they receive under the care of Dr. Nicholas Lowery.    Current treatment regimen and start date Capecitabine (01/12/22)   Interval History She continues on capecitabine 2 tablets (1000 mg) in the morning and 2 tablets (1000 mg) in the evening on days 1 to 14 of a 21-day cycle. This is being given monotherapy. Therapy is planned to continue until disease progression or unacceptable toxicity.  Colleen Lowery is here today as a follow-up to her capecitabine management.  She was last seen by Dr. Lindi Lowery on 05/18/22 and clinical pharmacy on 07/18/22.  She had restaging scans on 05/08/22 which showed a good response.  Dr. Lindi Lowery does scans for her every 6 months and these have been ordered for December. He will see her again with labs and to review scans on 10/31/22. If she continues to tolerate capecitabine she can likely be seen every 3 months with labs.   Response to Therapy Colleen Lowery is doing well.  She is not reporting any side effects from the capecitabine at this time.  We discussed today that she will next see Dr. Lindi Lowery with labs on 10/31/22 and we have placed orders for a CT CAP with contrast with an expected date 1 week prior to her visit with Dr. Lindi Lowery.  We did provide written instructions for the prescription prednisone and over-the-counter diphenhydramine that she takes prior to the exam.  She has done this in the past and tolerated the exam with no issues.  We also sent the prednisone prescription to her local pharmacy of choice.  She obtains the diphenhydramine on her own over-the-counter.  Her blood pressure was slightly elevated today which she reports continues when she is in the doctor's office.  She is tracking her blood pressures at home and she reports that the  values are normal.  She will also be seeing Dr. Lavera Lowery who manages her blood pressure medications and will provide her a blood pressure log when she sees her next.  Colleen Lowery states that she had an appointment with Dr. Clemmie Lowery but had to reschedule it.  She will contact Dr. Clemmie Lowery office to reschedule.  She does report having a cataract removed on 08/17/22 on her right eye.  She states that she started some new eyedrops but does not recall the name at this time.  She states she will not be on the much longer but if she was to be on them in December, she will bring this in when she sees Dr. Lindi Lowery.  We did update her medication list.  Of note, she has stopped the herbal supplement GABA which has been removed for her medication list.  Had discussed in the past that there is no regulation on the herbal supplements that she is taking and she understands the risk.  Her calcium had been slightly elevated at her last visit, this is now normalized.  As above, she will plan on seeing Dr. Lindi Lowery to review scans in December and at that time if she continues to tolerate capecitabine and her scans show no progression, she can likely be seen every 3 months with scans every 6 months per Dr. Geralyn Lowery recommendations.  Labs, vitals, treatment parameters, and manufacturer guidelines assessing toxicity were reviewed with Colleen Lowery today. Based on  these values, patient is in agreement to continue therapy at this time.  Allergies Allergies  Allergen Reactions   Alcohol Anaphylaxis and Swelling    Purell hand sanitizer  Swelling in the luNGS, CHEST AND THROAT and seizures Problem with smell not the Alcohol Other reaction(s): Other (See Comments) Causes seizures and swelling    Contrast Media [Iodinated Contrast Media] Anaphylaxis    Throat closed, was 40 yrs ago  *Iodine    Iodine Anaphylaxis    Throat closed, was 40 yrs ago   Atorvastatin     Myalgia    Dust Mite Extract Swelling    SWELLING IS GI SYSTEM    Latex     QUESTIONABLE INTOLERABLE MUCOUS OOZING OUT OF THROAT   Lisinopril Other (See Comments)    Pt does not remember reaction    Molds & Smuts Swelling    SWELLING IS IN GI SYSTEM    Vitals    09/26/2022   11:00 AM 07/18/2022   10:58 AM 05/18/2022   10:28 AM  Oncology Vitals  Height 158 cm 158 cm 159 cm  Weight 62.914 kg 61.326 kg 63.685 kg  Weight (lbs) 138 lbs 11 oz 135 lbs 3 oz 140 lbs 6 oz  BMI 25.37 kg/m2   25.37 kg/m2 24.73 kg/m2   24.73 kg/m2 25.27 kg/m2   25.27 kg/m2  Temp 97.5 F (36.4 C) 97.7 F (36.5 C) 97.8 F (36.6 C)  Pulse Rate 87 78 84  BP 158/87 174/80 179/82  Resp 18 18 19   SpO2 100 % 100 % 100 %  BSA (m2) 1.66 m2   1.66 m2 1.64 m2   1.64 m2 1.68 m2   1.68 m2    Laboratory Data    Latest Ref Rng & Units 09/26/2022   10:20 AM 07/18/2022   10:38 AM 05/18/2022   10:14 AM  CBC EXTENDED  WBC 4.0 - 10.5 K/uL 4.7  5.7  4.7   RBC 3.87 - 5.11 MIL/uL 3.82  3.98  3.97   Hemoglobin 12.0 - 15.0 g/dL 13.0  13.9  13.6   HCT 36.0 - 46.0 % 37.9  39.5  39.1   Platelets 150 - 400 K/uL 165  156  171   NEUT# 1.7 - 7.7 K/uL 2.8  3.6  2.5   Lymph# 0.7 - 4.0 K/uL 1.1  1.2  1.2        Latest Ref Rng & Units 09/26/2022   10:20 AM 07/18/2022   10:38 AM 05/18/2022   10:14 AM  CMP  Glucose 70 - 99 mg/dL 166  176  160   BUN 8 - 23 mg/dL 11  17  12    Creatinine 0.44 - 1.00 mg/dL 0.80  0.93  0.86   Sodium 135 - 145 mmol/L 137  139  137   Potassium 3.5 - 5.1 mmol/L 3.9  3.8  4.3   Chloride 98 - 111 mmol/L 107  110  108   CO2 22 - 32 mmol/L 23  21  23    Calcium 8.9 - 10.3 mg/dL 9.0  10.4  9.4   Total Protein 6.5 - 8.1 g/dL 7.0  7.2  7.2   Total Bilirubin 0.3 - 1.2 mg/dL 0.7  0.6  0.6   Alkaline Phos 38 - 126 U/L 115  107  115   AST 15 - 41 U/L 15  15  16    ALT 0 - 44 U/L 6  6  7      Lab Results  Component Value  Date   MG 1.9 05/09/2016   MG 1.9 02/23/2016   MG 1.9 02/07/2016   Lab Results  Component Value Date   CA2729 36.8 07/18/2022   CA2729 31.3 05/18/2022    CA2729 41.5 (H) 04/24/2022     Adverse Effects Assessment Elevated calcium: Now normalized.  We will continue to monitor  Adherence Assessment Colleen Lowery reports missing 0 doses over the past 8 weeks.   Reason for missed dose: N/A Patient was re-educated on importance of adherence.   Access Assessment Colleen Lowery is currently receiving her capecitabine through [facility] concerns: None  Medication Reconciliation The patient's medication list was reviewed today with the patient?  Yes New medications or herbal supplements have recently been started?  This, as above, she started a new eyedrop for recent cataract surgery.  She was unaware of the name and she said that she would bring this in should she stay on this medication.  However, she said that she will likely not be on this when she sees Dr. Lindi Lowery in December. Any medications have been discontinued?  Yes, as above, we removed GABA per patient request that she is no longer on this herbal supplement. The medication list was updated and reconciled based on the patient's most recent medication list in the electronic medical record (EMR) including herbal products and OTC medications.   Medications Current Outpatient Medications  Medication Sig Dispense Refill   ADRENAL CORTEX PO Take 4 capsules by mouth daily.     alendronate (FOSAMAX) 70 MG tablet Take 1 tablet (70 mg total) by mouth once a week. Take with a full glass of water on an empty stomach. 12 tablet 3   Ascorbic Acid (VITAMIN C PO) Take 2,000 mg by mouth daily.     B Complex-Folic Acid (B COMPLEX PLUS PO) Take 4 tablets by mouth daily.     Beta Carotene (VITAMIN A) 25000 UNIT capsule Take 50,000 Units by mouth daily.     CALCIUM PO Take 10 mLs by mouth daily. Liquid 300 MG /5 ML     capecitabine (XELODA) 500 MG tablet Take 2 tablets (1,000 mg total) by mouth 2 (two) times daily after a meal. 14 days on and 7 days off 56 tablet 6    Cholecalciferol (VITAMIN D3) 5000 units CAPS Take 5,000 Units by mouth daily. 2500 units per cap     Flaxseed, Linseed, (FLAXSEED OIL) 1000 MG CAPS Take 3,000 mg by mouth daily. Taking as needed     furosemide (LASIX) 20 MG tablet Take 20 mg by mouth daily.     losartan (COZAAR) 50 MG tablet Take 50 mg by mouth daily.     MAGNESIUM PO Take 5 mLs by mouth daily. Liquid 2.5 ml/150 mg     Misc Natural Products (BLOOD SUGAR BALANCE PO) Take 3 capsules by mouth daily. Blood Sugar Manager     OVER THE COUNTER MEDICATION Take 1 Scoop by mouth daily. Branched chain amino acids 4000  Patient states "taking for muscles"     OVER THE COUNTER MEDICATION Take 400-800 mg by mouth daily as needed (Based on Blood glucose). gymnema sylvestre Patient takes for "lowering blood glucose"     OVER THE COUNTER MEDICATION Take 5 capsules by mouth daily. Pau d' arco 500 mg each  Patient states now taking liquid and "keeps mold down"     OVER THE COUNTER MEDICATION Take 6 capsules by mouth daily. Caprylic acid Patient taking to "lower candida risk"  OVER THE COUNTER MEDICATION Take 5 capsules by mouth daily. Pancreatic enzymes Patient taking "to get fluid out"     OVER THE COUNTER MEDICATION Take 3 capsules by mouth daily. Raw Adrenal Patient taking for "overall energy"     OVER THE COUNTER MEDICATION Take 6 capsules by mouth daily. Beef Pancreas Patient taking for "pancreatic function improvement and lymphatic system improvement"     PANTETHINE PO Take 1-2 tablets by mouth daily as needed (Itching). Vitamin B5     potassium chloride SA (K-DUR,KLOR-CON) 20 MEQ tablet Take 20 mEq by mouth 2 (two) times daily.     Propylene Glycol (SYSTANE BALANCE) 0.6 % SOLN Place 2 drops into the right eye in the morning and at bedtime.     Homeopathic Products (ARNICARE PAIN RELIEF EX) Apply 1 application topically daily as needed (pain). (Patient not taking: Reported on 09/26/2022)     predniSONE (DELTASONE) 50 MG tablet Take  prednisone 1 tab by mouth 13 hours, 7 hours, and 1 hour prior to CT scan.  Take diphenhydramine 50 mg by mouth 1 hour prior to CT scan (Patient not taking: Reported on 09/26/2022) 3 tablet 0   No current facility-administered medications for this visit.    Drug-Drug Interactions (DDIs) DDIs were evaluated?  Yes Significant DDIs?  No, as above, Colleen Lowery knows that it is difficult to assess interactions with a large amount of herbal supplements that she takes.  She knows there is little regulation on these agents and understands the risk. The patient was instructed to speak with their health care provider and/or the oral chemotherapy pharmacist before starting any new drug, including prescription or over the counter, natural / herbal products, or vitamins.  Supportive Care Colleen Lowery is tolerating capecitabine very well and knows to contact us should any questions or concerns arise prior to her next visit with Dr. Lindi Lowery  Dosing Assessment Hepatic adjustments needed?  No Renal adjustments needed?  No Toxicity adjustments needed?  No The current dosing regimen is appropriate to continue at this time.  Follow-Up Plan Continue capecitabine 1000 mg every 12 hours for 14 days, followed by a 7-day period. Restaging scans ordered today with an expected date of 10/24/22.  She has labs scheduled for 10/31/22 and visit with Dr. Lindi Lowery.  He will review the scans at this time.  She can likely be seen every 3 months with labs and scans every 6 months if she continues to tolerate capecitabine Labs, pharmacy clinic visit, in mid March  Colleen Lowery participated in the discussion, expressed understanding, and voiced agreement with the above plan. All questions were answered to her satisfaction. The patient was advised to contact the clinic at [phone] with any questions or concerns prior to her return visit.   I spent 30 minutes assessing and educating the patient.  Raina Mina, RPH-CPP,  09/26/2022  11:31 AM   **Disclaimer: This note was dictated with voice recognition software. Similar sounding words can inadvertently be transcribed and this note may contain transcription errors which may not have been corrected upon publication of note.**

## 2022-09-27 ENCOUNTER — Telehealth: Payer: Self-pay | Admitting: Pharmacist

## 2022-09-27 LAB — CANCER ANTIGEN 27.29: CA 27.29: 68.9 U/mL — ABNORMAL HIGH (ref 0.0–38.6)

## 2022-09-27 NOTE — Telephone Encounter (Signed)
Scheduled appointment per 11/14 los. Left voicemail.

## 2022-10-10 ENCOUNTER — Other Ambulatory Visit (HOSPITAL_COMMUNITY): Payer: Self-pay

## 2022-10-12 ENCOUNTER — Other Ambulatory Visit (HOSPITAL_COMMUNITY): Payer: Self-pay

## 2022-10-17 ENCOUNTER — Other Ambulatory Visit: Payer: Self-pay | Admitting: *Deleted

## 2022-10-17 DIAGNOSIS — Z170 Estrogen receptor positive status [ER+]: Secondary | ICD-10-CM

## 2022-10-17 MED ORDER — PREDNISONE 50 MG PO TABS: ORAL_TABLET | ORAL | 0 refills | Status: DC

## 2022-10-17 NOTE — Telephone Encounter (Signed)
Received VM from pt requesting script for oral prep prior to upcoming CT scan due to hx of allergy to IV contrast.  Prescription for prednisone 50 mg p.o tablet x3 sent to pharmacy on file.  RN attempt x1 to contact pt.  No answer, left detailed VM as to when pt needs to take prednisone and benadryl 50 mg p.o.

## 2022-10-24 ENCOUNTER — Other Ambulatory Visit (HOSPITAL_COMMUNITY): Payer: Self-pay

## 2022-10-25 ENCOUNTER — Telehealth: Payer: Self-pay | Admitting: Pharmacy Technician

## 2022-10-25 NOTE — Telephone Encounter (Signed)
Oral Oncology Patient Advocate Encounter  After completing a benefits investigation, prior authorization for Capecitabine is not required at this time, prior Josem Kaufmann has been renewed and is active until 11/13/23.  Patient may continue to fill as normal.     Lady Deutscher, CPhT-Adv Oncology Pharmacy Patient [place] Direct Number: [phone]  Fax: [phone]

## 2022-10-26 ENCOUNTER — Inpatient Hospital Stay: Payer: Medicare HMO | Attending: Hematology and Oncology | Admitting: Pharmacist

## 2022-10-26 ENCOUNTER — Ambulatory Visit (HOSPITAL_COMMUNITY)
Admission: RE | Admit: 2022-10-26 | Discharge: 2022-10-26 | Disposition: A | Discharge status: Home / Self Care | Payer: Medicare HMO | Source: Ambulatory Visit | Attending: Hematology and Oncology | Admitting: Hematology and Oncology

## 2022-10-26 ENCOUNTER — Other Ambulatory Visit (HOSPITAL_COMMUNITY): Payer: Self-pay

## 2022-10-26 DIAGNOSIS — Z170 Estrogen receptor positive status [ER+]: Secondary | ICD-10-CM | POA: Insufficient documentation

## 2022-10-26 DIAGNOSIS — C50412 Malignant neoplasm of upper-outer quadrant of left female breast: Secondary | ICD-10-CM | POA: Insufficient documentation

## 2022-10-26 MED ORDER — IOHEXOL 300 MG/ML  SOLN
100.0000 mL | Freq: Once | INTRAMUSCULAR | Status: AC | PRN
  Administered 2022-10-26: 100 mL via INTRAVENOUS

## 2022-10-26 NOTE — Progress Notes (Signed)
[place]       Telephone: [phone]?Fax: [phone]   Oncology Clinical Pharmacist Practitioner Progress Note  Colleen Lowery is a 81 y.o. female with a diagnosis of metastatic breast cancer currently on capecitabne under the care of Dr. Nicholas Lose.   I connected with [name] today by telephone and verified that I was speaking with the correct person using two patient identifiers.   Other persons participating in the visit and their role in the encounter: none   Patient's location: home  Provider's location: clinic  Ms. Stelzer was contacted today by clinical pharmacy after receiving a message from [place] about her capecitabine prescription.  They had contacted her regarding a refill as Ms. Shovlin was to have started her new cycle of capecitabine on 10/12/22 and today would have been starting her off week.  During their discussion, it was found that Ms. Sotomayor still had 17 tablets left of medication (started with #56 -- 1000 mg BID 14/21).  Our oral chemotherapy pharmacist was kind enough to contact the patient and did discover that Ms. Vowels only took capecitabine for approximately 7 days.  We discussed this all with Dr. Lindi Adie, and he is in agreement to hold off restarting capecitabine until he sees her on 10/31/22 so we can confirm how many tablets she has left currently. She has restaging scans today that Dr. Lindi Adie will also go over on that date.  We contacted Ms. Gad with these instructions and she verbalized agreement with the plan.  If Dr. Lindi Adie approves on 10/31/22, clinical pharmacy can send a new prescription to [facility] pharmacy for the difference of how many tablets that she has left currently.  This should then add up to a 14-day supply that she will restart tentatively on 11/01/22.  We did discuss with Ms. Behl that she should bring in her capecitabine prescription at Dr. [name] visit so clinical pharmacy can  evaluate the remaining amount left for capecitabine.  Higinio Roger Renier participated in the discussion, expressed understanding, and voiced agreement with the above plan. All questions were answered to her satisfaction. The patient was advised to contact the clinic at [phone] with any questions or concerns prior to her return visit.  Clinical pharmacy will continue to support [name] and Dr. Nicholas Lose as needed.  Raina Mina, RPH-CPP,  10/26/2022  2:53 PM   **Disclaimer: This note was dictated with voice recognition software. Similar sounding words can inadvertently be transcribed and this note may contain transcription errors which may not have been corrected upon publication of note.**

## 2022-10-27 ENCOUNTER — Telehealth: Payer: Self-pay | Admitting: *Deleted

## 2022-10-27 NOTE — Telephone Encounter (Signed)
Received VM from pt to confirm upcoming appt date and time.  RN attempt x 1 to return call.  No answer, LVM with appt date and time.

## 2022-10-31 ENCOUNTER — Other Ambulatory Visit: Payer: Self-pay

## 2022-10-31 ENCOUNTER — Inpatient Hospital Stay: Payer: Medicare HMO | Admitting: Hematology and Oncology

## 2022-10-31 ENCOUNTER — Inpatient Hospital Stay: Payer: Medicare HMO

## 2022-10-31 VITALS — BP 140/88 | HR 82 | Temp 97.3°F | Resp 18 | Ht 62.0 in | Wt 141.3 lb

## 2022-10-31 DIAGNOSIS — C787 Secondary malignant neoplasm of liver and intrahepatic bile duct: Secondary | ICD-10-CM | POA: Diagnosis not present

## 2022-10-31 DIAGNOSIS — Z170 Estrogen receptor positive status [ER+]: Secondary | ICD-10-CM | POA: Diagnosis not present

## 2022-10-31 DIAGNOSIS — C50412 Malignant neoplasm of upper-outer quadrant of left female breast: Secondary | ICD-10-CM

## 2022-10-31 DIAGNOSIS — Z79811 Long term (current) use of aromatase inhibitors: Secondary | ICD-10-CM | POA: Insufficient documentation

## 2022-10-31 LAB — CBC WITH DIFFERENTIAL (CANCER CENTER ONLY)
Abs Immature Granulocytes: 0.03 10*3/uL (ref 0.00–0.07)
Basophils Absolute: 0 10*3/uL (ref 0.0–0.1)
Basophils Relative: 1 %
Eosinophils Absolute: 0.1 10*3/uL (ref 0.0–0.5)
Eosinophils Relative: 1 %
HCT: 39.8 % (ref 36.0–46.0)
Hemoglobin: 13.8 g/dL (ref 12.0–15.0)
Immature Granulocytes: 1 %
Lymphocytes Relative: 24 %
Lymphs Abs: 1.4 10*3/uL (ref 0.7–4.0)
MCH: 33 pg (ref 26.0–34.0)
MCHC: 34.7 g/dL (ref 30.0–36.0)
MCV: 95.2 fL (ref 80.0–100.0)
Monocytes Absolute: 0.6 10*3/uL (ref 0.1–1.0)
Monocytes Relative: 10 %
Neutro Abs: 3.9 10*3/uL (ref 1.7–7.7)
Neutrophils Relative %: 63 %
Platelet Count: 163 10*3/uL (ref 150–400)
RBC: 4.18 MIL/uL (ref 3.87–5.11)
RDW: 14.6 % (ref 11.5–15.5)
WBC Count: 6.1 10*3/uL (ref 4.0–10.5)
nRBC: 0 % (ref 0.0–0.2)

## 2022-10-31 LAB — CMP (CANCER CENTER ONLY)
ALT: 10 U/L (ref 0–44)
AST: 16 U/L (ref 15–41)
Albumin: 3.9 g/dL (ref 3.5–5.0)
Alkaline Phosphatase: 94 U/L (ref 38–126)
Anion gap: 8 (ref 5–15)
BUN: 16 mg/dL (ref 8–23)
CO2: 24 mmol/L (ref 22–32)
Calcium: 9 mg/dL (ref 8.9–10.3)
Chloride: 106 mmol/L (ref 98–111)
Creatinine: 0.84 mg/dL (ref 0.44–1.00)
GFR, Estimated: >60 mL/min (ref >60)
Glucose, Bld: 267 mg/dL — ABNORMAL HIGH (ref 70–99)
Potassium: 3.8 mmol/L (ref 3.5–5.1)
Sodium: 138 mmol/L (ref 135–145)
Total Bilirubin: 0.5 mg/dL (ref 0.3–1.2)
Total Protein: 6.9 g/dL (ref 6.5–8.1)

## 2022-10-31 MED ORDER — LIDOCAINE-PRILOCAINE 2.5-2.5 % EX CREA: TOPICAL_CREAM | CUTANEOUS | 3 refills | Status: DC

## 2022-10-31 MED ORDER — PROCHLORPERAZINE MALEATE 10 MG PO TABS: 10.0000 mg | ORAL_TABLET | Freq: Four times a day (QID) | ORAL | 1 refills | Status: DC | PRN

## 2022-10-31 MED ORDER — LOPERAMIDE HCL 2 MG PO CAPS: ORAL_CAPSULE | ORAL | 0 refills | Status: DC

## 2022-10-31 MED ORDER — ONDANSETRON HCL 8 MG PO TABS: 8.0000 mg | ORAL_TABLET | Freq: Three times a day (TID) | ORAL | 1 refills | Status: DC | PRN

## 2022-10-31 NOTE — Progress Notes (Signed)
Patient Care Team: [name], MD as PCP - General (Family Medicine) Kyung Rudd, MD as Consulting Physician (Radiation Oncology) Nicholas Lose, MD as Consulting Physician (Hematology and Oncology) Erroll Luna, MD as Consulting Physician (General Surgery)  DIAGNOSIS:  Encounter Diagnosis  Name Primary?   Malignant neoplasm of upper-outer quadrant of left breast in female, estrogen receptor positive ([place]) Yes    SUMMARY OF ONCOLOGIC HISTORY: Oncology History  Malignant neoplasm of upper-outer quadrant of left breast in female, estrogen receptor positive ([place])  03/22/2020 Initial Diagnosis   Diffuse left breast swelling. skin thickening and two areas of irregular hypoechogenicity in the 2-3 o'clock region, 4.9cm, and at least 4 abnormal lymph nodes  with cortical thickening. 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    10/28/2020 - 12/17/2020 Radiation Therapy   The patient initially received a dose of 50.4 Gy in 28 fractions to the left breast and supraclavicular region using whole-breast tangent fields. This was delivered using a 3-D conformal technique. The pt received a boost delivering an additional 10 Gy in 5 fractions using a electron boost with 75mV electrons. The total dose was 60.4 Gy.   11/2020 - 11/2027 Anti-estrogen oral therapy   Anastrozole   01/30/2022 Miscellaneous   Guardant360:ERBB2 (exon 20 insertion): Benefit from [place], MSI high not detected     CHIEF COMPLIANT:  metastatic breast cancer review scans  INTERVAL HISTORY: Colleen GALLINAis a 81y.o. with above-mentioned history of left breast cancer. She presents to the clinic today for a follow-up to review scans. She reports to the clinic today that she is doing well, today is the first day she felt tired.   ALLERGIES:  is allergic to contrast media [iodinated contrast media], iodine, atorvastatin, dust mite extract, lisinopril, and molds & smuts.  MEDICATIONS:  Current Outpatient Medications  Medication Sig Dispense Refill   cephALEXin (KEFLEX) 500 MG capsule Take by mouth.     ketorolac (ACULAR) 0.5 % ophthalmic solution Place 1 drop into the right eye 4 (four) times daily.     PROLENSA 0.07 % SOLN Place 1 drop into the right eye 4 (four) times daily.     TRUE METRIX BLOOD GLUCOSE TEST test strip SMARTSIG:Via Meter     TRUEplus Lancets 33G MISC      ADRENAL CORTEX PO Take 4 capsules by mouth daily.     alendronate (FOSAMAX) 70 MG tablet Take 1 tablet (70 mg total) by mouth once a week. Take with a full glass of water on an empty stomach. 12 tablet 3   Ascorbic Acid (VITAMIN C PO) Take 2,000 mg by mouth daily.     B Complex Vitamins (VITAMIN B COMPLEX) TABS Take 2 tablets by mouth daily.     B Complex-Folic Acid (B COMPLEX PLUS PO) Take 4 tablets by mouth daily.     Beta Carotene (VITAMIN A) 25000 UNIT capsule Take 50,000 Units by mouth daily.     CALCIUM PO Take 10 mLs by mouth daily. Liquid 300  MG /5 ML     Cholecalciferol (VITAMIN D3) 5000 units CAPS Take 5,000 Units by mouth daily. 2500 units per cap     Flaxseed, Linseed, (FLAXSEED OIL) 1000 MG CAPS Take 3,000 mg by mouth daily. Taking as needed     furosemide (LASIX) 20 MG tablet Take 20 mg by mouth daily.     Homeopathic Products (ARNICARE PAIN RELIEF EX) Apply 1 application topically daily as needed (pain). (Patient not taking: Reported on 09/26/2022)     losartan (COZAAR) 50 MG tablet Take 50 mg by mouth daily.     MAGNESIUM PO Take 5 mLs  by mouth daily. Liquid 2.5 ml/150 mg     Misc Natural Products (BLOOD SUGAR BALANCE PO) Take 3 capsules by mouth daily. Blood Sugar Manager     OVER THE COUNTER MEDICATION Take 1 Scoop by mouth daily. Branched chain amino acids 4000  Patient states "taking for muscles"     OVER THE COUNTER MEDICATION Take 400-800 mg by mouth daily as needed (Based on Blood glucose). gymnema sylvestre Patient takes for "lowering blood glucose"     OVER THE COUNTER MEDICATION Take 5 capsules by mouth daily. Pau d' arco 500 mg each  Patient states now taking liquid and "keeps mold down"     OVER THE COUNTER MEDICATION Take 6 capsules by mouth daily. Caprylic acid Patient taking to "lower candida risk"     OVER THE COUNTER MEDICATION Take 5 capsules by mouth daily. Pancreatic enzymes Patient taking "to get fluid out"     OVER THE COUNTER MEDICATION Take 3 capsules by mouth daily. Raw Adrenal Patient taking for "overall energy"     OVER THE COUNTER MEDICATION Take 6 capsules by mouth daily. Beef Pancreas Patient taking for "pancreatic function improvement and lymphatic system improvement"     PANTETHINE PO Take 1-2 tablets by mouth daily as needed (Itching). Vitamin B5     potassium chloride SA (K-DUR,KLOR-CON) 20 MEQ tablet Take 20 mEq by mouth 2 (two) times daily.     Propylene Glycol (SYSTANE BALANCE) 0.6 % SOLN Place 2 drops into the right eye in the morning and at bedtime.     No current facility-administered medications for this visit.    PHYSICAL EXAMINATION: ECOG PERFORMANCE STATUS: 1 - Symptomatic but completely ambulatory  Vitals:   10/31/22 1055  BP: (!) 140/88  Pulse: 82  Resp: 18  Temp: (!) 97.3 F (36.3 C)  SpO2: 100%   Filed Weights   10/31/22 1055  Weight: 141 lb 4.8 oz (64.1 kg)      LABORATORY DATA:  I have reviewed the data as listed    Latest Ref Rng & Units 09/26/2022   10:20 AM 07/18/2022   10:38 AM 05/18/2022   10:14 AM  CMP  Glucose 70 - 99 mg/dL 166  176  160   BUN  8 - 23 mg/dL _0 Creatinine 0.44 - 1.00 mg/dL 0.80  0.93  0.86   Sodium 135 - 145 mmol/L 137  139  137   Potassium 3.5 - 5.1 mmol/L 3.9  3.8  4.3   Chloride 98 - 111 mmol/L 107  110  108   CO2 22 - 32 mmol/L _1 Calcium 8.9 - 10.3 mg/dL 9.0  10.4  9.4   Total Protein 6.5 - 8.1 g/dL 7.0  7.2  7.2   Total Bilirubin 0.3 - 1.2 mg/dL 0.7  0.6  0.6  Alkaline Phos 38 - 126 U/L 115  107  115   AST 15 - 41 U/L _0 ALT 0 - 44 U/L _1 Lab Results  Component Value Date   WBC 6.1 10/31/2022   HGB 13.8 10/31/2022   HCT 39.8 10/31/2022   MCV 95.2 10/31/2022   PLT 163 10/31/2022   NEUTROABS 3.9 10/31/2022    ASSESSMENT & PLAN:  Malignant neoplasm of upper-outer quadrant of left breast in female, estrogen receptor positive ([place]) 03/22/2020:diffuse left breast swelling. skin thickening and two areas of irregular hypoechogenicity in the 2-3 o'clock region, 4.9cm, and at least 4 abnormal lymph nodes  with cortical thickening. 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  Neoadjuvant chemotherapy with CMF 2.  09/14/2020:Left mastectomy ([name]): invasive and in situ ductal carcinoma, grade 2, 15cm, clear margins, with 14/14 lymph nodes positive for metastatic carcinoma with extracapsular extension.  ER 30%, PR 10%, HER-2 negative, Ki-67 15% 3.  Adjuvant radiation 10/28/2020-12/17/2020 4.  Followed by adjuvant antiestrogen therapy with anastrozole started 01/11/2021 ------------------------------------------------------------------------------------------------------------------------------------------- CT chest abdomen pelvis: 12/07/2021: New enhancing lesion liver dome 3.2 centimeters, left kidney hemorrhagic cyst (likely benign), unchanged groundglass opacity right lower lobe 0.6 cm  (nonspecific)   Liver biopsy 12/26/2021: Consistent with metastatic breast cancer (CK7 positive, CK20 positive, GA TA-3 positive) ER 0%, PR 0%, HER2 1+ by IHC (negative)   Current treatment: Capecitabine 1000 mg p.o. twice daily 2 weeks on 1 week off started 01/11/2022 Capecitabine toxicities: Dark discoloration of the fingers: She is currently on 1000 mg p.o. twice daily.  Denies any diarrhea   CT CAP 05/08/2022: Liver the lesion diminished in size 2.1 cm (was 3.2 cm), multiple new sclerotic nondisplaced fractures of anterior left third through seventh ribs, unchanged chronic sclerotic fractures left 11th and 12th ribs.  CT CAP 10/27/2022: Marked interval progression of the lesion in the dome of liver with numerous smaller satellite lesions and a new lesion in the posterior dome of the left liver.  Borderline enlarged 11 mm lymph node hepatoduodenal ligament   Recommendation: Switch treatment to [place]. Sacituzumab-Govitecan counseling: I discussed with the patient that this drug is an antibody drug conjugate. Its toxicities are related to neutropenia and diarrhea and hypersensitivity reactions primarily.  The range of adverse effects can be quite extensive including's edema, skin reactions, anemia, elevation of LFTs, dizziness fatigue joint pains and dyspnea.  Diarrhea can be quite prevalent and up to 62% of patients.  Hypersensitivity reactions and 37%.  Grade 3 and 4 neutropenia and 26% of patients.  Phase 2 study showed one third of patients who got this drug had a response that lasted an average of 7.7 months.  Return to clinic to start infusions. We will request a port placement by IR. Chemo education with Jenny Reichmann.   Orders Placed This Encounter  Procedures   IR IMAGING GUIDED PORT INSERTION    Standing Status:   Future    Standing Expiration Date:   11/01/2023    Order Specific Question:   Reason for Exam (SYMPTOM  OR DIAGNOSIS REQUIRED)    Answer:   cheomothearpy    Order Specific  Question:   Preferred Imaging Location?    Answer:   [facility]   The patient has  a good understanding of the overall plan. she agrees with it. she will call with any problems that may develop before the next visit here. Total time spent: 30 mins including face to face time and time spent for planning, charting and co-ordination of care   Harriette Ohara, MD 10/31/22    I Gardiner Coins am acting as a Education administrator for [employer]  I have reviewed the above documentation for accuracy and completeness, and I agree with the above.

## 2022-10-31 NOTE — Progress Notes (Signed)
DISCONTINUE OFF PATHWAY REGIMEN - Breast   [id number]:CMF (IV cyclophosphamide) q21 days:   A cycle is every 21 days:     Cyclophosphamide      Methotrexate      Fluorouracil   **Always confirm dose/schedule in your pharmacy ordering system**  REASON: Disease Progression PRIOR TREATMENT: Off Pathway: CMF (IV cyclophosphamide) q21 days TREATMENT RESPONSE: Stable Disease (SD)  START ON PATHWAY REGIMEN - Breast     A cycle is every 21 days:     Sacituzumab govitecan-hziy   **Always confirm dose/schedule in your pharmacy ordering system**  Patient Characteristics: Distant Metastases or Locoregional Recurrent Disease - Unresected or Locally Advanced Unresectable Disease Progressing after Neoadjuvant and Local Therapies, ER Negative, Chemotherapy, HER2 Negative, Second Line Therapeutic Status: Distant Metastases HER2 Status: Negative (-) ER Status: Negative (-) PR Status: Negative (-) Therapy Approach Indicated: Standard Chemotherapy/Endocrine Therapy Line of Therapy: Second Line Intent of Therapy: Non-Curative / Palliative Intent, Discussed with Patient

## 2022-10-31 NOTE — Assessment & Plan Note (Signed)
03/22/2020:diffuse left breast swelling. skin thickening and two areas of irregular hypoechogenicity in the 2-3 o'clock region, 4.9cm, and at least 4 abnormal lymph nodes  with cortical thickening. 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  Neoadjuvant chemotherapy with CMF 2.  09/14/2020:Left mastectomy ([name]): invasive and in situ ductal carcinoma, grade 2, 15cm, clear margins, with 14/14 lymph nodes positive for metastatic carcinoma with extracapsular extension.  ER 30%, PR 10%, HER-2 negative, Ki-67 15% 3.  Adjuvant radiation 10/28/2020-12/17/2020 4.  Followed by adjuvant antiestrogen therapy with anastrozole started 01/11/2021 ------------------------------------------------------------------------------------------------------------------------------------------- CT chest abdomen pelvis: 12/07/2021: New enhancing lesion liver dome 3.2 centimeters, left kidney hemorrhagic cyst (likely benign), unchanged groundglass opacity right lower lobe 0.6 cm (nonspecific)   Liver biopsy 12/26/2021: Consistent with metastatic breast cancer (CK7 positive, CK20 positive, GA TA-3 positive) ER 0%, PR 0%, HER2 1+ by IHC (negative)   Current treatment: Capecitabine 1000 mg p.o. twice daily 2 weeks on 1 week off started 01/11/2022 Capecitabine toxicities: Dark discoloration of the fingers: She is currently on 1000 mg p.o. twice daily.  Denies any diarrhea   CT CAP 05/08/2022: Liver the lesion diminished in size 2.1 cm (was 3.2 cm), multiple new sclerotic nondisplaced fractures of anterior left third through seventh ribs, unchanged chronic sclerotic fractures left 11th and 12th ribs.  CT CAP 10/27/2022: Marked interval progression of the lesion in the dome of liver with numerous smaller satellite lesions and a new lesion in the  posterior dome of the left liver.  Borderline enlarged 11 mm lymph node hepatoduodenal ligament   Recommendation: Switch treatment to [place]. Sacituzumab-Govitecan counseling: I discussed with the patient that this drug is an antibody drug conjugate. Its toxicities are related to neutropenia and diarrhea and hypersensitivity reactions primarily.  The range of adverse effects can be quite extensive including's edema, skin reactions, anemia, elevation of LFTs, dizziness fatigue joint pains and dyspnea.  Diarrhea can be quite prevalent and up to 62% of patients.  Hypersensitivity reactions and 37%.  Grade 3 and 4 neutropenia and 26% of patients.  Phase 2 study showed one third of patients who got this drug had a response that lasted an average of 7.7 months.  Return to clinic to start infusions.

## 2022-10-31 NOTE — Progress Notes (Signed)
OFF PATHWAY REGIMEN - Breast  No Change  Continue With Treatment as Ordered.  Original Decision Date/Time: 04/07/2020 16:38   [id number]:CMF (IV cyclophosphamide) q21 days:   A cycle is every 21 days:     Cyclophosphamide      Methotrexate      Fluorouracil   **Always confirm dose/schedule in your pharmacy ordering system**  Patient Characteristics: Preoperative or Nonsurgical Candidate (Clinical Staging), Neoadjuvant Therapy followed by Surgery, Invasive Disease, Chemotherapy, HER2 Negative/Unknown/Equivocal, ER Positive Therapeutic Status: Preoperative or Nonsurgical Candidate (Clinical Staging) AJCC M Category: cM0 AJCC Grade: G2 Breast Surgical Plan: Neoadjuvant Therapy followed by Surgery ER Status: Positive (+) AJCC 8 Stage Grouping: IIIB HER2 Status: Negative (-) AJCC T Category: cT4b AJCC N Category: cN1 PR Status: Positive (+) Intent of Therapy: Curative Intent, Discussed with Patient

## 2022-11-01 ENCOUNTER — Other Ambulatory Visit: Payer: Self-pay

## 2022-11-01 ENCOUNTER — Telehealth: Payer: Self-pay | Admitting: Hematology and Oncology

## 2022-11-01 LAB — CANCER ANTIGEN 27.29: CA 27.29: 77.1 U/mL — ABNORMAL HIGH (ref 0.0–38.6)

## 2022-11-01 NOTE — Telephone Encounter (Signed)
Scheduled appointment per 12/19 los. Patient is aware.

## 2022-11-02 ENCOUNTER — Other Ambulatory Visit: Payer: Self-pay | Admitting: Hematology and Oncology

## 2022-11-03 ENCOUNTER — Other Ambulatory Visit: Payer: Self-pay

## 2022-11-09 ENCOUNTER — Telehealth: Payer: Self-pay | Admitting: Hematology and Oncology

## 2022-11-09 NOTE — Telephone Encounter (Signed)
Scheduled appointment per WQ. Patient is aware of the made appointments.

## 2022-11-15 ENCOUNTER — Inpatient Hospital Stay: Payer: Medicare HMO | Admitting: Pharmacist

## 2022-11-15 ENCOUNTER — Inpatient Hospital Stay: Payer: Medicare HMO | Attending: Hematology and Oncology

## 2022-11-15 DIAGNOSIS — C787 Secondary malignant neoplasm of liver and intrahepatic bile duct: Secondary | ICD-10-CM | POA: Insufficient documentation

## 2022-11-15 DIAGNOSIS — Z5112 Encounter for antineoplastic immunotherapy: Secondary | ICD-10-CM | POA: Insufficient documentation

## 2022-11-15 DIAGNOSIS — F17210 Nicotine dependence, cigarettes, uncomplicated: Secondary | ICD-10-CM | POA: Insufficient documentation

## 2022-11-15 DIAGNOSIS — C50412 Malignant neoplasm of upper-outer quadrant of left female breast: Secondary | ICD-10-CM | POA: Diagnosis present

## 2022-11-15 DIAGNOSIS — Z170 Estrogen receptor positive status [ER+]: Secondary | ICD-10-CM | POA: Diagnosis not present

## 2022-11-15 DIAGNOSIS — Z79899 Other long term (current) drug therapy: Secondary | ICD-10-CM | POA: Insufficient documentation

## 2022-11-15 NOTE — Progress Notes (Signed)
[place]       Telephone: [phone]?Fax: [phone]   Oncology Clinical Pharmacist Practitioner Initial Assessment  Colleen Lowery is a 82 y.o. female with a diagnosis of breast cancer. They were contacted today via in-person visit.  Indication/Regimen Sacituzumab govitecan-hziy [name]) is being used appropriately for treatment of breast cancer by Dr. Nicholas Lose.      Wt Readings from Last 1 Encounters:  10/31/22 141 lb 4.8 oz (64.1 kg)    Estimated body surface area is 1.67 meters squared as calculated from the following:   Height as of 10/31/22: '5\' 2"'$  (1.575 m).   Weight as of 10/31/22: 141 lb 4.8 oz (64.1 kg).  The dosing regimen is every 21 days until disease progression or unacceptable toxicity  Sacituzumab govitecan-hziy (10 mg/kg) on Days 1 and 8  Dose Modifications No dose modifications of sacituzumab govitecan-hziy at this time. Colleen Lowery did request diphenhydramine be switched to cetirizine due to sleepiness of taking diphenhydramine in the past. Dr. Lindi Adie did approve and treatment plan will be updated for all cycles.   Allergies Allergies  Allergen Reactions   Contrast Media [Iodinated Contrast Media] Anaphylaxis    Throat closed, was 40 yrs ago  *Iodine    Iodine Anaphylaxis    Throat closed, was 40 yrs ago   Atorvastatin     Myalgia    Dust Mite Extract Swelling    SWELLING IS GI SYSTEM   Lisinopril Other (See Comments)    Pt does not remember reaction    Molds & Smuts Swelling    SWELLING IS IN GI SYSTEM   No vitals or labs were done at today's educational visit  Contraindications Contraindications were reviewed? Yes Contraindications to therapy were identified? No   Safety Precautions The following safety precautions for the use of sacituzumab govitecan-hziy were reviewed:  Fever: reviewed the importance of having a thermometer and the Centers for Disease Control and Prevention (CDC) definition of fever which is  100.43F (38C) or higher. Patient should call 24/7 triage at [phone] if experiencing a fever or any other symptoms Decreased white blood cells (WBCs) and increased risk for infection Decreased hemoglobin, part of the red blood cells that carry iron and oxygen Diarrhea (loose and/or urgent bowel movements) Constipation Nausea Vomiting Changes in kidney function Hair loss (alopecia) Fatigue Changes in electrolytes and other laboratory values Abdominal pain Rash or itchy skin Hypersensitivity reactions Handling body fluids and waste Intimacy, sexual activity, contraception, and fertility With regard to vitamins, minerals, and herbal medications, we discussed again that it is important to realize that these are not tested or regulated by the FDA. Therefore, they have not been studied as extensively as prescription medications, and their safety profile is not as well known. In addition, their purity cannot be guaranteed. Many studies have shown that the ingredients listed on a bottle of an herbal supplement may not actually be contained inside. The [employer] website: TheyParty.dk may provide some useful information.    Medication Reconciliation Current Outpatient Medications  Medication Sig Dispense Refill   alendronate (FOSAMAX) 70 MG tablet Take 1 tablet (70 mg total) by mouth once a week. Take with a full glass of water on an empty stomach. 12 tablet 3   Ascorbic Acid (VITAMIN C PO) Take 2,000 mg by mouth daily.     B Complex Vitamins (VITAMIN B COMPLEX) TABS Take 2 tablets by mouth daily.     B Complex-Folic Acid (B COMPLEX PLUS PO) Take  4 tablets by mouth daily.     Beta Carotene (VITAMIN A) 25000 UNIT capsule Take 50,000 Units by mouth daily.     CALCIUM PO Take 10 mLs by mouth daily. Liquid 300 MG /5 ML     Cholecalciferol (VITAMIN D3) 5000 units CAPS Take 5,000 Units by mouth daily.  2500 units per cap     Flaxseed, Linseed, (FLAXSEED OIL) 1000 MG CAPS Take 3,000 mg by mouth daily. Taking as needed     furosemide (LASIX) 20 MG tablet Take 20 mg by mouth daily.     Homeopathic Products (ARNICARE PAIN RELIEF EX) Apply 1 application  topically daily as needed (pain).     ketorolac (ACULAR) 0.5 % ophthalmic solution Place 1 drop into the right eye 4 (four) times daily.     losartan (COZAAR) 50 MG tablet Take 50 mg by mouth daily.     MAGNESIUM PO Take 5 mLs by mouth daily. Liquid 2.5 ml/150 mg     Misc Natural Products (BLOOD SUGAR BALANCE PO) Take 3 capsules by mouth daily. Blood Sugar Manager     OVER THE COUNTER MEDICATION Take 1 Scoop by mouth daily. Branched chain amino acids 4000  Patient states "taking for muscles"     OVER THE COUNTER MEDICATION Take 400-800 mg by mouth daily as needed (Based on Blood glucose). gymnema sylvestre Patient takes for "lowering blood glucose"     OVER THE COUNTER MEDICATION Take 5 capsules by mouth daily. Pau d' arco 500 mg each  Patient states now taking liquid and "keeps mold down"     OVER THE COUNTER MEDICATION Take 6 capsules by mouth daily. Caprylic acid Patient taking to "lower candida risk"     OVER THE COUNTER MEDICATION Take 5 capsules by mouth daily. Pancreatic enzymes Patient taking "to get fluid out"     OVER THE COUNTER MEDICATION Take 3 capsules by mouth daily. Raw Adrenal Patient taking for "overall energy"     OVER THE COUNTER MEDICATION Take 6 capsules by mouth daily. Beef Pancreas Patient taking for "pancreatic function improvement and lymphatic system improvement"     PANTETHINE PO Take 1-2 tablets by mouth daily as needed (Itching). Vitamin B5     potassium chloride SA (K-DUR,KLOR-CON) 20 MEQ tablet Take 20 mEq by mouth 2 (two) times daily.     PROLENSA 0.07 % SOLN Place 1 drop into the right eye 4 (four) times daily.     Propylene Glycol (SYSTANE BALANCE) 0.6 % SOLN Place 2 drops into the right eye in the morning  and at bedtime.     TRUE METRIX BLOOD GLUCOSE TEST test strip SMARTSIG:Via Meter     TRUEplus Lancets 33G MISC      ADRENAL CORTEX PO Take 4 capsules by mouth daily. (Patient not taking: Reported on 11/15/2022)     lidocaine-prilocaine (EMLA) cream Apply to affected area once (Patient not taking: Reported on 11/15/2022) 30 g 3   loperamide (IMODIUM) 2 MG capsule Take 2 tabs by mouth with first loose stool, then 1 tab with each additional loose stool as needed. Do not exceed 8 tabs in a 24-hour period (Patient not taking: Reported on 11/15/2022) 30 capsule 0   ondansetron (ZOFRAN) 8 MG tablet Take 1 tablet (8 mg total) by mouth every 8 (eight) hours as needed for nausea or vomiting. Take 1 tablets ('8mg'$  total) by mouth every 8hrs as needed. Start on the third day after chemotherapy. (Patient not taking: Reported on 11/15/2022) 30 tablet 1  prochlorperazine (COMPAZINE) 10 MG tablet Take 1 tablet (10 mg total) by mouth every 6 (six) hours as needed for nausea or vomiting. (Patient not taking: Reported on 11/15/2022) 30 tablet 1   No current facility-administered medications for this visit.   Medication reconciliation is based on the patient's most recent medication list in the electronic medical record (EMR) including herbal products and OTC medications.   The patient's medication list was reviewed today with the patient? Yes   Drug-drug interactions (DDIs) DDIs were evaluated? Yes Significant DDIs identified? No . As discussed with Colleen Lowery  in the past, it is extremely difficult to properly identify interactions from herbal supplements with her current chemotherapy regimen. She understands the risk and agrees to proceed.  Drug-Food Interactions Drug-food interactions were evaluated? Yes Drug-food interactions identified? No   Follow-up Plan  Treatment start date: 11/20/22 Port placement date: 11/17/22 Clinical pharmacy will switch diphenhydramine to cetirizine per patient request on all treatment days.  Reviewed and approved by Dr. Lindi Adie Clinical pharmacy will assist Dr. Lindi Adie and Colleen Lowery on an as needed basis going forward  Colleen Lowery participated in the discussion, expressed understanding, and voiced agreement with the above plan. All questions were answered to her satisfaction. The patient was advised to contact the clinic at [phone] with any questions or concerns prior to her return visit.   I spent 30 minutes assessing the patient.  Raina Mina, RPH-CPP, 11/15/2022 1:22 PM  **Disclaimer: This note was dictated with voice recognition software. Similar sounding words can inadvertently be transcribed and this note may contain transcription errors which may not have been corrected upon publication of note.**

## 2022-11-16 ENCOUNTER — Other Ambulatory Visit (HOSPITAL_COMMUNITY): Payer: Self-pay | Admitting: Student

## 2022-11-17 ENCOUNTER — Ambulatory Visit (HOSPITAL_COMMUNITY)
Admission: RE | Admit: 2022-11-17 | Discharge: 2022-11-17 | Disposition: A | Discharge status: Home / Self Care | Payer: Medicare HMO | Source: Ambulatory Visit | Attending: Hematology and Oncology | Admitting: Hematology and Oncology

## 2022-11-17 ENCOUNTER — Encounter (HOSPITAL_COMMUNITY): Payer: Self-pay

## 2022-11-17 ENCOUNTER — Other Ambulatory Visit: Payer: Self-pay

## 2022-11-17 DIAGNOSIS — E119 Type 2 diabetes mellitus without complications: Secondary | ICD-10-CM | POA: Diagnosis not present

## 2022-11-17 DIAGNOSIS — F32A Depression, unspecified: Secondary | ICD-10-CM | POA: Diagnosis not present

## 2022-11-17 DIAGNOSIS — I509 Heart failure, unspecified: Secondary | ICD-10-CM | POA: Diagnosis not present

## 2022-11-17 DIAGNOSIS — R0609 Other forms of dyspnea: Secondary | ICD-10-CM | POA: Insufficient documentation

## 2022-11-17 DIAGNOSIS — Z170 Estrogen receptor positive status [ER+]: Secondary | ICD-10-CM | POA: Insufficient documentation

## 2022-11-17 DIAGNOSIS — K219 Gastro-esophageal reflux disease without esophagitis: Secondary | ICD-10-CM | POA: Diagnosis not present

## 2022-11-17 DIAGNOSIS — E039 Hypothyroidism, unspecified: Secondary | ICD-10-CM | POA: Insufficient documentation

## 2022-11-17 DIAGNOSIS — R569 Unspecified convulsions: Secondary | ICD-10-CM | POA: Diagnosis not present

## 2022-11-17 DIAGNOSIS — I110 Hypertensive heart disease with heart failure: Secondary | ICD-10-CM | POA: Diagnosis not present

## 2022-11-17 DIAGNOSIS — F17210 Nicotine dependence, cigarettes, uncomplicated: Secondary | ICD-10-CM | POA: Insufficient documentation

## 2022-11-17 DIAGNOSIS — K5090 Crohn's disease, unspecified, without complications: Secondary | ICD-10-CM | POA: Insufficient documentation

## 2022-11-17 DIAGNOSIS — C50412 Malignant neoplasm of upper-outer quadrant of left female breast: Secondary | ICD-10-CM | POA: Diagnosis not present

## 2022-11-17 DIAGNOSIS — Z95810 Presence of automatic (implantable) cardiac defibrillator: Secondary | ICD-10-CM | POA: Insufficient documentation

## 2022-11-17 HISTORY — PX: IR IMAGING GUIDED PORT INSERTION: IMG5740

## 2022-11-17 MED ORDER — FENTANYL CITRATE (PF) 100 MCG/2ML IJ SOLN
INTRAMUSCULAR | Status: AC
  Filled 2022-11-17: qty 2

## 2022-11-17 MED ORDER — MIDAZOLAM HCL 2 MG/2ML IJ SOLN
INTRAMUSCULAR | Status: AC | PRN
  Administered 2022-11-17 (×2): 1 mg via INTRAVENOUS

## 2022-11-17 MED ORDER — LIDOCAINE-EPINEPHRINE 1 %-1:100000 IJ SOLN
INTRAMUSCULAR | Status: AC
  Administered 2022-11-17: 13 mL
  Filled 2022-11-17: qty 1

## 2022-11-17 MED ORDER — HEPARIN SOD (PORK) LOCK FLUSH 100 UNIT/ML IV SOLN
INTRAVENOUS | Status: AC
  Administered 2022-11-17: 500 [IU] via INTRAVENOUS
  Filled 2022-11-17: qty 5

## 2022-11-17 MED ORDER — LOSARTAN POTASSIUM 50 MG PO TABS
50.0000 mg | ORAL_TABLET | Freq: Once | ORAL | Status: AC
  Administered 2022-11-17: 50 mg via ORAL
  Filled 2022-11-17: qty 1

## 2022-11-17 MED ORDER — SODIUM CHLORIDE 0.9 % IV SOLN: INTRAVENOUS | Status: DC

## 2022-11-17 MED ORDER — MIDAZOLAM HCL 2 MG/2ML IJ SOLN
INTRAMUSCULAR | Status: AC
  Filled 2022-11-17: qty 2

## 2022-11-17 MED ORDER — FENTANYL CITRATE (PF) 100 MCG/2ML IJ SOLN
INTRAMUSCULAR | Status: AC | PRN
  Administered 2022-11-17 (×2): 50 ug via INTRAVENOUS

## 2022-11-17 MED FILL — Fosaprepitant Dimeglumine For IV Infusion 150 MG (Base Eq): INTRAVENOUS | Qty: 5 | Status: AC

## 2022-11-17 MED FILL — Dexamethasone Sodium Phosphate Inj 100 MG/10ML: INTRAMUSCULAR | Qty: 1 | Status: AC

## 2022-11-17 NOTE — Discharge Instructions (Addendum)
Follow up with provider regarding high blood pressure Monday. (ASAP)  For questions /concerns may call Interventional Radiology at [phone] or  Interventional Radiology clinic [phone]   You may remove your dressing and shower tomorrow afternoon  DO NOT use EMLA cream for 2 weeks after port placement as the cream will remove surgical glue on your incision.    Implanted Port Insertion, Care After This sheet gives you information about how to care for yourself after your procedure. Your health care provider may also give you more specific instructions. If you have problems or questions, contact your health careprovider. What can I expect after the procedure? After the procedure, it is common to have: Discomfort at the port insertion site. Bruising on the skin over the port. This should improve over 3-4 days. Follow these instructions at home: [facility] care After your port is placed, you will get a manufacturer's information card. The card has information about your port. Keep this card with you at all times. Take care of the port as told by your health care provider. Ask your health care provider if you or a family member can get training for taking care of the port at home. A home health care nurse may also take care of the port. Make sure to remember what type of port you have. Incision care Follow instructions from your health care provider about how to take care of your port insertion site. Make sure you: Wash your hands with soap and water before and after you change your bandage (dressing). If soap and water are not available, use hand sanitizer. Change your dressing as told by your health care provider. Leave skin glue, or adhesive strips in place. These skin closures may need to stay in place for 2 weeks or longer.  Check your port insertion site every day for signs of infection. Check for:      - Redness, swelling, or pain.                     - Fluid or blood.      - Warmth.       - Pus or a bad smell. Activity Return to your normal activities as told by your health care provider. Ask your health care provider what activities are safe for you. Do not lift anything that is heavier than 10 lb (4.5 kg), or the limit that you are told, until your health care provider says that it is safe. General instructions Take over-the-counter and prescription medicines only as told by your health care provider. Do not take baths, swim, or use a hot tub until your health care provider approves. Ask your health care provider if you may take showers. You may only be allowed to take sponge baths. Do not drive for 24 hours if you were given a sedative during your procedure. Wear a medical alert bracelet in case of an emergency. This will tell any health care providers that you have a port. Keep all follow-up visits as told by your health care provider. This is important. Contact a health care provider if: You cannot flush your port with saline as directed, or you cannot draw blood from the port. You have a fever or chills. You have redness, swelling, or pain around your port insertion site. You have fluid or blood coming from your port insertion site. Your port insertion site feels warm to the touch. You have pus or a bad smell coming from the port insertion site. Get  help right away if: You have chest pain or shortness of breath. You have bleeding from your port that you cannot control. Summary Take care of the port as told by your health care provider. Keep the manufacturer's information card with you at all times. Change your dressing as told by your health care provider. Contact a health care provider if you have a fever or chills or if you have redness, swelling, or pain around your port insertion site. Keep all follow-up visits as told by your health care provider. This information is not intended to replace advice given to you by your health care provider. Make sure you discuss  any questions you have with your healthcare provider. Document Revised: 05/28/2018 Document Reviewed: 05/28/2018  Moderate Conscious Sedation, Adult, Care After This sheet gives you information about how to care for yourself after your procedure. Your health care provider may also give you more specific instructions. If you have problems or questions, contact your health careprovider. What can I expect after the procedure? After the procedure, it is common to have: Sleepiness for several hours. Impaired judgment for several hours. Difficulty with balance. Vomiting if you eat too soon. Follow these instructions at home: For the time period you were told by your health care provider: Rest. Do not participate in activities where you could fall or become injured. Do not drive or use machinery. Do not drink alcohol. Do not take sleeping pills or medicines that cause drowsiness. Do not make important decisions or sign legal documents. Do not take care of children on your own. Eating and drinking  Follow the diet recommended by your health care provider. Drink enough fluid to keep your urine pale yellow. If you vomit: Drink water, juice, or soup when you can drink without vomiting. Make sure you have little or no nausea before eating solid foods.  General instructions Take over-the-counter and prescription medicines only as told by your health care provider. Have a responsible adult stay with you for the time you are told. It is important to have someone help care for you until you are awake and alert. Do not smoke. Keep all follow-up visits as told by your health care provider. This is important. Contact a health care provider if: You are still sleepy or having trouble with balance after 24 hours. You feel light-headed. You keep feeling nauseous or you keep vomiting. You develop a rash. You have a fever. You have redness or swelling around the IV site. Get help right away if: You  have trouble breathing. You have new-onset confusion at home. Summary After the procedure, it is common to feel sleepy, have impaired judgment, or feel nauseous if you eat too soon. Rest after you get home. Know the things you should not do after the procedure. Follow the diet recommended by your health care provider and drink enough fluid to keep your urine pale yellow. Get help right away if you have trouble breathing or new-onset confusion at home. This information is not intended to replace advice given to you by your health care provider. Make sure you discuss any questions you have with your healthcare provider. Document Revised: 02/27/2020 Document Reviewed: 09/25/2019 Elsevier Patient Education  2022 [employer].

## 2022-11-17 NOTE — Procedures (Signed)
Interventional Radiology Procedure Note  Procedure: RT IJ POWER POR    Complications: None  Estimated Blood Loss:  MIN  Findings: TIP SVCRA    Tamera Punt, MD

## 2022-11-17 NOTE — Progress Notes (Signed)
   11/17/22 1311  Vitals  Temp 98.1 F (36.7 C)  Temp Source Oral  Pulse Rate 94  Pulse Rate Source Monitor  Resp 16  Respiratory Pattern Regular  BP (!) 205/92  SpO2 100 %  LOC  Level of Consciousness Alert  Orientation Level Oriented X4  Aldrete  Activity 2  Respiration 2  Oxygen Saturation 2  Circulation 2  Consciousness 2  Aldrete Score 10   Call to K. Allred, PA to notify re: BP. Rechecked x 3 and remains high. Patient denies HA, dizziness, or visual changes. States she is "nervous in the hospital setting and allergic to Purell hand sanitizer - it makes me have seizures" All Purell units taped and signage applied per patient request and satisfaction. Noted in chart under allergies.  No new orders.

## 2022-11-17 NOTE — Progress Notes (Signed)
Notified Dr Annamaria Boots of hypertension prior to discharge.  Instructed Pt to follow up with PCP Monday and she verbalizes understanding.

## 2022-11-17 NOTE — Progress Notes (Signed)
   11/17/22 1438  Vitals  Pulse Rate 77  Pulse Rate Source Monitor  Resp 16  Respiratory Pattern Regular;Unlabored  BP (!) 181/102  SpO2 98 %   Post Losartan '50mg'$  PO. Call to K. Allred, PA. No new orders.

## 2022-11-17 NOTE — H&P (Signed)
Referring Physician(s): Nicholas Lose  Supervising Physician: Daryll Brod  Patient Status:  [facility]  Chief Complaint:  "I'm here for a port a cath"  Subjective: Pt known to IR service from liver lesion bx on 12/26/21. She is a smoker and has a hx of metastatic left breast cancer, initially diagnosed in 2021, now with progressive disease. She presents today for port a cath placement to assist with treatment. She denies fever,HA,CP,abd/back pain,N/V or bleeding. She does have dyspnea with exertion and occ cough. Additional med hx as below.   Past Medical History:  Diagnosis Date   AICD (automatic cardioverter/defibrillator) present    Allergy    Arthritis    FINGERS   Cancer ([place])    left breast   Cataract    right   Crohn disease ([place])    Depression    Diabetes mellitus without complication ([place])    TYPE 2 , TAKING OTC MED FORGYMEMA   GERD (gastroesophageal reflux disease)    Headache    sinus   Heart failure, acute, systolic and diastolic ([place]) MARCH 1610 DX   History of colonic polyps    Hypertension    Hypothyroidism    per patient, takes OTC meds when temperature drops low   IBS (irritable bowel syndrome)    Seizures ([place])    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, 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   Thyroid disease    AGE 50'S UNTIL  2016 OFF ALL THRYOID MEDS NOW    Wears dentures    Wears glasses    Past Surgical History:  Procedure Laterality Date   ABDOMINAL HYSTERECTOMY     PARTIAL   BREAST BIOPSY Left 04/01/2019   INVASIVE DUCTAL CARCINOMA   BREAST SURGERY     CARDIAC CATHETERIZATION  02/10/2016   Procedure: Right/Left Heart Cath and Coronary Angiography;  Surgeon: Leonie Man, MD;  Location: [facility];  Service: Cardiovascular;;   CATARACT EXTRACTION W/ INTRAOCULAR LENS IMPLANT     left   COLONOSCOPY WITH PROPOFOL N/A 02/16/2017   Procedure: COLONOSCOPY WITH PROPOFOL;  Surgeon: Carol Ada, MD;  Location: [facility];  Service: Endoscopy;  Laterality: N/A;    COLONOSCOPY WITH PROPOFOL N/A 08/12/2021   Procedure: COLONOSCOPY WITH PROPOFOL;  Surgeon: Carol Ada, MD;  Location: [facility];  Service: Endoscopy;  Laterality: N/A;   EP IMPLANTABLE DEVICE N/A 02/10/2016   Procedure: ICD Implant;  Surgeon: Will Meredith Leeds, MD;  Location: [facility];  Service: Cardiovascular;  Laterality: N/A;   ESOPHAGOGASTRODUODENOSCOPY (EGD) WITH PROPOFOL N/A 02/16/2017   Procedure: ESOPHAGOGASTRODUODENOSCOPY (EGD) WITH PROPOFOL;  Surgeon: Carol Ada, MD;  [facility];  Service: Endoscopy;  Laterality: N/A;  reflux   IR IMAGING GUIDED PORT INSERTION  04/27/2020   MASTECTOMY Left    MASTECTOMY MODIFIED RADICAL Left 09/14/2020   Procedure: LEFT MASTECTOMY MODIFIED RADICAL;  Surgeon: Erroll Luna, MD;  Location: [facility];  Service: General;  Laterality: Left;  PEC BLOCK   POLYPECTOMY  08/12/2021   Procedure: POLYPECTOMY;  Surgeon: Carol Ada, MD;  Location: [facility];  Service: Endoscopy;;   TONSILLECTOMY       Allergies: Alcohol, Contrast media [iodinated contrast media], Iodine, Atorvastatin, Dust mite extract, Latex, Lisinopril, and Molds & smuts  Medications: Prior to Admission medications   Medication Sig Start Date End Date Taking? Authorizing Provider  alendronate (FOSAMAX) 70 MG tablet Take 1 tablet (70 mg total) by mouth once a week. Take with a full glass of water on an empty stomach.  05/24/22  Yes Nicholas Lose, MD  Ascorbic Acid (VITAMIN C PO) Take 2,000 mg by mouth daily.   Yes [provider]  B Complex Vitamins (VITAMIN B COMPLEX) TABS Take 2 tablets by mouth daily.   Yes [provider]  B Complex-Folic Acid (B COMPLEX PLUS PO) Take 4 tablets by mouth daily.   Yes [provider]  Beta Carotene (VITAMIN A) 25000 UNIT capsule Take 50,000 Units by mouth daily.   Yes [provider]  CALCIUM PO Take 10 mLs by mouth daily. Liquid 300 MG /5 ML   Yes [provider]   Cholecalciferol (VITAMIN D3) 5000 units CAPS Take 5,000 Units by mouth daily. 2500 units per cap   Yes [provider]  Flaxseed, Linseed, (FLAXSEED OIL) 1000 MG CAPS Take 3,000 mg by mouth daily. Taking as needed   Yes [provider]  furosemide (LASIX) 20 MG tablet Take 20 mg by mouth daily.   Yes [provider]  Homeopathic Products (ARNICARE PAIN RELIEF EX) Apply 1 application  topically daily as needed (pain).   Yes [provider]  ketorolac (ACULAR) 0.5 % ophthalmic solution Place 1 drop into the right eye 4 (four) times daily. 08/15/22  Yes [provider]  losartan (COZAAR) 50 MG tablet Take 50 mg by mouth daily. 12/16/19  Yes [provider]  MAGNESIUM PO Take 5 mLs by mouth daily. Liquid 2.5 ml/150 mg   Yes [provider]  Misc Natural Products (BLOOD SUGAR BALANCE PO) Take 3 capsules by mouth daily. Blood Sugar Manager   Yes [provider]  OVER THE COUNTER MEDICATION Take 1 Scoop by mouth daily. Branched chain amino acids 4000  Patient states "taking for muscles"   Yes [provider]  OVER THE COUNTER MEDICATION Take 400-800 mg by mouth daily as needed (Based on Blood glucose). gymnema sylvestre Patient takes for "lowering blood glucose"   Yes [provider]  OVER THE COUNTER MEDICATION Take 5 capsules by mouth daily. Pau d' arco 500 mg each  Patient states now taking liquid and "keeps mold down"   Yes [provider]  OVER THE COUNTER MEDICATION Take 6 capsules by mouth daily. Caprylic acid Patient taking to "lower candida risk"   Yes [provider]  OVER THE COUNTER MEDICATION Take 5 capsules by mouth daily. Pancreatic enzymes Patient taking "to get fluid out"   Yes [provider]  OVER THE COUNTER MEDICATION Take 3 capsules by mouth daily. Raw Adrenal Patient taking for "overall energy"   Yes [provider]  OVER THE COUNTER MEDICATION Take 6  capsules by mouth daily. Beef Pancreas Patient taking for "pancreatic function improvement and lymphatic system improvement"   Yes [provider]  PANTETHINE PO Take 1-2 tablets by mouth daily as needed (Itching). Vitamin B5   Yes [provider]  potassium chloride SA (K-DUR,KLOR-CON) 20 MEQ tablet Take 20 mEq by mouth 2 (two) times daily.   Yes [provider]  PROLENSA 0.07 % SOLN Place 1 drop into the right eye 4 (four) times daily. 08/15/22  Yes [provider]  Propylene Glycol (SYSTANE BALANCE) 0.6 % SOLN Place 2 drops into the right eye in the morning and at bedtime.   Yes [provider]  TRUE METRIX BLOOD GLUCOSE TEST test strip SMARTSIG:Via Meter 09/28/22  Yes [provider]  TRUEplus Lancets 33G [place]  09/28/22  Yes [provider]  ADRENAL CORTEX PO Take 4 capsules by mouth  daily. Patient not taking: Reported on 11/15/2022    [provider]  lidocaine-prilocaine (EMLA) cream Apply to affected area once Patient not taking: Reported on 11/15/2022 10/31/22   Nicholas Lose, MD  loperamide (IMODIUM) 2 MG capsule Take 2 tabs by mouth with first loose stool, then 1 tab with each additional loose stool as needed. Do not exceed 8 tabs in a 24-hour period Patient not taking: Reported on 11/15/2022 10/31/22   Nicholas Lose, MD  ondansetron (ZOFRAN) 8 MG tablet Take 1 tablet (8 mg total) by mouth every 8 (eight) hours as needed for nausea or vomiting. Take 1 tablets ('8mg'$  total) by mouth every 8hrs as needed. Start on the third day after chemotherapy. Patient not taking: Reported on 11/15/2022 10/31/22   Nicholas Lose, MD  prochlorperazine (COMPAZINE) 10 MG tablet Take 1 tablet (10 mg total) by mouth every 6 (six) hours as needed for nausea or vomiting. Patient not taking: Reported on 11/15/2022 10/31/22   Nicholas Lose, MD     Vital Signs: Pulse 94   Temp 98.1 F (36.7 C) (Oral)   Resp 16   SpO2 100%   Physical Exam  awake/alert; chest- CTA bilat; left chest wall pacer/AICD; heart- sl tachy but reg rhythm; abd- soft,+BS,NT; no LE edema  Imaging: No results found.  Labs:  CBC: Recent Labs    05/18/22 1014 07/18/22 1038 09/26/22 1020 10/31/22 1033  WBC 4.7 5.7 4.7 6.1  HGB 13.6 13.9 13.0 13.8  HCT 39.1 39.5 37.9 39.8  PLT 171 156 165 163    COAGS: Recent Labs    12/26/21 0630  INR 0.9    BMP: Recent Labs    05/18/22 1014 07/18/22 1038 09/26/22 1020 10/31/22 1033  NA 137 139 137 138  K 4.3 3.8 3.9 3.8  CL 108 110 107 106  CO2 23 21* 23 24  GLUCOSE 160* 176* 166* 267*  BUN '12 17 11 16  '$ CALCIUM 9.4 10.4* 9.0 9.0  CREATININE 0.86 0.93 0.80 0.84  GFRNONAA >60 >60 >60 >60    LIVER FUNCTION TESTS: Recent Labs    05/18/22 1014 07/18/22 1038 09/26/22 1020 10/31/22 1033  BILITOT 0.6 0.6 0.7 0.5  AST '16 15 15 16  '$ ALT '7 6 6 10  '$ ALKPHOS 115 107 115 94  PROT 7.2 7.2 7.0 6.9  ALBUMIN 4.3 4.4 4.2 3.9    Assessment and Plan: Pt known to IR service from liver lesion bx on 12/26/21. She is a smoker and has a hx of metastatic left breast cancer, initially diagnosed in 2021, now with progressive disease. She presents today for port a cath placement to assist with treatment. PMH also sig for AICD, Chron's dz, depression, GERD, DM, heart failure, HTN, hypothyroidism, IBS, seizures.Risks and benefits of image guided port-a-catheter placement was discussed with the patient including, but not limited to bleeding, infection, pneumothorax, or fibrin sheath development and need for additional procedures.  All of the patient's questions were answered, patient is agreeable to proceed. Consent signed and in chart.    Electronically Signed: D. Rowe Robert, PA-C 11/17/2022, 1:36 PM   I spent a total of 25 Minutes at the the patient's bedside AND on the patient's hospital floor or unit, greater than 50% of which was counseling/coordinating care for port a cath placement

## 2022-11-17 NOTE — Progress Notes (Signed)
Call to patient's designated contact - Cecelia. Discussed timeframes for pre/intra and post procedure. Verbalized understanding. Aware that call will be made post procedure to discuss pick up time and any further discharge instructions. She is aware the timeframe will be between 1645-1700.

## 2022-11-17 NOTE — Progress Notes (Signed)
Call back from K. Allred, PA. Verbal order to give patient her losartan '50mg'$  daily dose as she reports she has not taken med today. Order placed for STAT.

## 2022-11-20 ENCOUNTER — Inpatient Hospital Stay: Payer: Medicare HMO

## 2022-11-20 ENCOUNTER — Inpatient Hospital Stay (HOSPITAL_BASED_OUTPATIENT_CLINIC_OR_DEPARTMENT_OTHER): Payer: Medicare HMO | Admitting: Adult Health

## 2022-11-20 ENCOUNTER — Encounter: Payer: Self-pay | Admitting: Adult Health

## 2022-11-20 VITALS — BP 182/88 | HR 97 | Temp 97.5°F | Resp 17 | Wt 136.4 lb

## 2022-11-20 VITALS — BP 164/81 | HR 75 | Resp 18

## 2022-11-20 DIAGNOSIS — C50412 Malignant neoplasm of upper-outer quadrant of left female breast: Secondary | ICD-10-CM | POA: Diagnosis not present

## 2022-11-20 DIAGNOSIS — Z5112 Encounter for antineoplastic immunotherapy: Secondary | ICD-10-CM | POA: Diagnosis not present

## 2022-11-20 DIAGNOSIS — Z170 Estrogen receptor positive status [ER+]: Secondary | ICD-10-CM

## 2022-11-20 DIAGNOSIS — Z95828 Presence of other vascular implants and grafts: Secondary | ICD-10-CM

## 2022-11-20 DIAGNOSIS — C787 Secondary malignant neoplasm of liver and intrahepatic bile duct: Secondary | ICD-10-CM | POA: Diagnosis not present

## 2022-11-20 HISTORY — DX: Secondary malignant neoplasm of liver and intrahepatic bile duct: C78.7

## 2022-11-20 LAB — CBC WITH DIFFERENTIAL (CANCER CENTER ONLY)
Abs Immature Granulocytes: 0.03 10*3/uL (ref 0.00–0.07)
Basophils Absolute: 0 10*3/uL (ref 0.0–0.1)
Basophils Relative: 0 %
Eosinophils Absolute: 0 10*3/uL (ref 0.0–0.5)
Eosinophils Relative: 1 %
HCT: 37.1 % (ref 36.0–46.0)
Hemoglobin: 12.8 g/dL (ref 12.0–15.0)
Immature Granulocytes: 1 %
Lymphocytes Relative: 10 %
Lymphs Abs: 0.7 10*3/uL (ref 0.7–4.0)
MCH: 31.7 pg (ref 26.0–34.0)
MCHC: 34.5 g/dL (ref 30.0–36.0)
MCV: 91.8 fL (ref 80.0–100.0)
Monocytes Absolute: 0.8 10*3/uL (ref 0.1–1.0)
Monocytes Relative: 12 %
Neutro Abs: 4.8 10*3/uL (ref 1.7–7.7)
Neutrophils Relative %: 76 %
Platelet Count: 222 10*3/uL (ref 150–400)
RBC: 4.04 MIL/uL (ref 3.87–5.11)
RDW: 14.4 % (ref 11.5–15.5)
WBC Count: 6.3 10*3/uL (ref 4.0–10.5)
nRBC: 0 % (ref 0.0–0.2)

## 2022-11-20 LAB — PHOSPHORUS: Phosphorus: 3.3 mg/dL (ref 2.5–4.6)

## 2022-11-20 LAB — CMP (CANCER CENTER ONLY)
ALT: 8 U/L (ref 0–44)
AST: 21 U/L (ref 15–41)
Albumin: 3.7 g/dL (ref 3.5–5.0)
Alkaline Phosphatase: 95 U/L (ref 38–126)
Anion gap: 6 (ref 5–15)
BUN: 13 mg/dL (ref 8–23)
CO2: 24 mmol/L (ref 22–32)
Calcium: 9.5 mg/dL (ref 8.9–10.3)
Chloride: 103 mmol/L (ref 98–111)
Creatinine: 0.81 mg/dL (ref 0.44–1.00)
GFR, Estimated: >60 mL/min (ref >60)
Glucose, Bld: 295 mg/dL — ABNORMAL HIGH (ref 70–99)
Potassium: 4.6 mmol/L (ref 3.5–5.1)
Sodium: 133 mmol/L — ABNORMAL LOW (ref 135–145)
Total Bilirubin: 0.5 mg/dL (ref 0.3–1.2)
Total Protein: 6.3 g/dL — ABNORMAL LOW (ref 6.5–8.1)

## 2022-11-20 LAB — MAGNESIUM: Magnesium: 1.7 mg/dL (ref 1.7–2.4)

## 2022-11-20 LAB — GLUCOSE, CAPILLARY: Glucose-Capillary: 162 mg/dL — ABNORMAL HIGH (ref 70–99)

## 2022-11-20 MED ORDER — SODIUM CHLORIDE 0.9 % IV SOLN
10.0000 mg | Freq: Once | INTRAVENOUS | Status: AC
  Administered 2022-11-20: 10 mg via INTRAVENOUS
  Filled 2022-11-20: qty 10

## 2022-11-20 MED ORDER — SODIUM CHLORIDE 0.9 % IV SOLN
5.8000 mg/kg | Freq: Once | INTRAVENOUS | Status: AC
  Administered 2022-11-20: 360 mg via INTRAVENOUS
  Filled 2022-11-20: qty 36

## 2022-11-20 MED ORDER — SODIUM CHLORIDE 0.9% FLUSH
10.0000 mL | INTRAVENOUS | Status: DC | PRN
  Administered 2022-11-20: 10 mL

## 2022-11-20 MED ORDER — ACETAMINOPHEN 325 MG PO TABS
ORAL_TABLET | ORAL | Status: AC
  Filled 2022-11-20: qty 2

## 2022-11-20 MED ORDER — SODIUM CHLORIDE 0.9% FLUSH
10.0000 mL | Freq: Once | INTRAVENOUS | Status: AC
  Administered 2022-11-20: 10 mL

## 2022-11-20 MED ORDER — ATROPINE SULFATE 1 MG/ML IV SOLN
0.5000 mg | Freq: Once | INTRAVENOUS | Status: DC | PRN
  Filled 2022-11-20: qty 1

## 2022-11-20 MED ORDER — SODIUM CHLORIDE 0.9 % IV SOLN
150.0000 mg | Freq: Once | INTRAVENOUS | Status: AC
  Administered 2022-11-20: 150 mg via INTRAVENOUS
  Filled 2022-11-20: qty 150

## 2022-11-20 MED ORDER — PALONOSETRON HCL INJECTION 0.25 MG/5ML
0.2500 mg | Freq: Once | INTRAVENOUS | Status: AC
  Administered 2022-11-20: 0.25 mg via INTRAVENOUS
  Filled 2022-11-20: qty 5

## 2022-11-20 MED ORDER — FAMOTIDINE IN NACL 20-0.9 MG/50ML-% IV SOLN
20.0000 mg | Freq: Once | INTRAVENOUS | Status: AC
  Administered 2022-11-20: 20 mg via INTRAVENOUS
  Filled 2022-11-20: qty 50

## 2022-11-20 MED ORDER — SODIUM CHLORIDE 0.9 % IV SOLN: Freq: Once | INTRAVENOUS | Status: AC

## 2022-11-20 MED ORDER — HEPARIN SOD (PORK) LOCK FLUSH 100 UNIT/ML IV SOLN
500.0000 [IU] | Freq: Once | INTRAVENOUS | Status: AC | PRN
  Administered 2022-11-20: 500 [IU]

## 2022-11-20 MED ORDER — ACETAMINOPHEN 325 MG PO TABS
650.0000 mg | ORAL_TABLET | Freq: Once | ORAL | Status: AC
  Administered 2022-11-20: 650 mg via ORAL
  Filled 2022-11-20: qty 2

## 2022-11-20 MED ORDER — CETIRIZINE HCL 10 MG/ML IV SOLN
10.0000 mg | Freq: Once | INTRAVENOUS | Status: AC
  Administered 2022-11-20: 10 mg via INTRAVENOUS
  Filled 2022-11-20: qty 1

## 2022-11-20 NOTE — Assessment & Plan Note (Signed)
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  Colleen Lowery is doing moderately well today.  Her blood pressure is elevated however this is normal for her and being around the hand sanitizer that she has a sensitivity to.  I rechecked her blood pressure which was improved and she will proceed with Colleen Lowery today.  Her labs today are stable she is eating and drinking well.  We discussed Colleen Lowery potential side effects and she will keep a log of what she experiences over the next week and bring that information back so we can discuss further.  Colleen Lowery will proceed with treatment today and return in 1 week for labs, follow-up, and day 8 of therapy.

## 2022-11-20 NOTE — Progress Notes (Signed)
[facility] Follow up:    Colleen Jude, MD [place] [address]   DIAGNOSIS:  Cancer Staging  Malignant neoplasm of upper-outer quadrant of left breast in female, estrogen receptor positive ([place]) Staging form: Breast, AJCC 8th Edition - Clinical stage from 04/07/2020: Stage IIIB (cT4b, cN1, cM0, G2, ER+, PR+, HER2-) - Signed by Nicholas Lose, MD on 04/07/2020 Stage prefix: Initial diagnosis Histologic grading system: 3 grade system - Pathologic stage from 09/14/2020: No Stage Recommended (ypT3, pN3a, cM0, G2, ER+, PR+, HER2-) - Unsigned Stage prefix: Post-therapy Histologic grading system: 3 grade system - Pathologic: Stage IV (pM1) - Signed by Gardenia Phlegm, NP on 11/20/2022   SUMMARY OF ONCOLOGIC HISTORY: Oncology History  Malignant neoplasm of upper-outer quadrant of left breast in female, estrogen receptor positive ([place])  03/22/2020 Initial Diagnosis   Diffuse left breast swelling. skin thickening and two areas of irregular hypoechogenicity in the 2-3 o'clock region, 4.9cm, and at least 4 abnormal lymph nodes  with cortical thickening. 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    10/28/2020 - 12/17/2020 Radiation Therapy   The patient initially received a dose of 50.4 Gy in 28 fractions to the left breast and supraclavicular region using whole-breast tangent fields. This was delivered using a 3-D conformal technique. The pt received a boost  delivering an additional 10 Gy in 5 fractions using a electron boost with 16mV electrons. The total dose was 60.4 Gy.   11/2020 - 11/2027 Anti-estrogen oral therapy   Anastrozole   01/30/2022 Miscellaneous   Guardant360:ERBB2 (exon 20 insertion): Benefit from Enhertu, MSI high not detected   11/20/2022 Cancer Staging   Staging form: Breast, AJCC 8th Edition - Pathologic: Stage IV (pM1) - Signed by CGardenia Phlegm NP on 11/20/2022   11/21/2022 -  Chemotherapy   Patient is on Treatment Plan : BREAST METASTATIC Sacituzumab govitecan-hziy ([name] D1,8 q21d       CURRENT THERAPY: TIvette Loyal INTERVAL HISTORY: LDALEENA ROTTER82y.o. female returns for follow-up to start treatment with [place]  She underwent CT chest abdomen pelvis on October 27, 2022 that demonstrated marked interval progression of the dominant lesion in the dome of the liver with new smaller satellite lesions and a new lesion in the posterior dome of the left liver.  Imaging features were compatible with progression of metastatic disease.  [place] Dr. GLindi Adieon December 19 who recommended that she change to [place] she is due to begin today.  She tells me that she is feeling well.  Her blood pressure today was elevated however she notes that because of the Purel smell it causes her blood pressure to increase and at its worst she has had seizures from smelling the Purel.   Patient Active Problem List   Diagnosis Date Noted   Metastases to the liver ([place] 11/20/2022   Port-A-Cath in place 05/31/2020   Malignant neoplasm of upper-outer quadrant of left breast in female, estrogen receptor positive ([place] 04/07/2020  Peripheral arterial disease ([place]) 06/14/2016   Upper airway cough syndrome 06/08/2016   CAD in native artery 05/31/2016   Chronic combined systolic and diastolic congestive heart failure ([place]) 05/31/2016   VT (ventricular tachycardia) ([place]) 02/09/2016   Acute on chronic combined systolic and  diastolic HF (heart failure) ([place]) 02/09/2016   Contrast media allergy 02/09/2016   Dyspnea 02/07/2016   Benign essential HTN 02/07/2016   Cigarette smoker 02/07/2016   Diabetes ([place]) 08/02/2014   Essential hypertension 08/02/2014    is allergic to alcohol, contrast media [iodinated contrast media], iodine, atorvastatin, dust mite extract, latex, lisinopril, and molds & smuts.  MEDICAL HISTORY: Past Medical History:  Diagnosis Date   AICD (automatic cardioverter/defibrillator) present    Allergy    Arthritis    FINGERS   Cancer ([place])    left breast   Cataract    right   Crohn disease ([place])    Depression    Diabetes mellitus without complication ([place])    TYPE 2 , TAKING OTC MED FORGYMEMA   GERD (gastroesophageal reflux disease)    Headache    sinus   Heart failure, acute, systolic and diastolic ([place]) MARCH 0272 DX   History of colonic polyps    Hypertension    Hypothyroidism    per patient, takes OTC meds when temperature drops low   IBS (irritable bowel syndrome)    Metastases to the liver ([place]) 11/20/2022   Seizures ([place])    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, 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   Thyroid disease    AGE 82 UNTIL  2016 OFF ALL THRYOID MEDS NOW    Wears dentures    Wears glasses     SURGICAL HISTORY: Past Surgical History:  Procedure Laterality Date   ABDOMINAL HYSTERECTOMY     PARTIAL   BREAST BIOPSY Left 04/01/2019   INVASIVE DUCTAL CARCINOMA   BREAST SURGERY     CARDIAC CATHETERIZATION  02/10/2016   Procedure: Right/Left Heart Cath and Coronary Angiography;  Surgeon: Leonie Man, MD;  Location: [facility];  Service: Cardiovascular;;   CATARACT EXTRACTION W/ INTRAOCULAR LENS IMPLANT     left   COLONOSCOPY WITH PROPOFOL N/A 02/16/2017   Procedure: COLONOSCOPY WITH PROPOFOL;  Surgeon: Carol Ada, MD;  Location: [facility];  Service: Endoscopy;  Laterality: N/A;   COLONOSCOPY WITH PROPOFOL N/A 08/12/2021   Procedure: COLONOSCOPY WITH PROPOFOL;   Surgeon: Carol Ada, MD;  Location: [facility];  Service: Endoscopy;  Laterality: N/A;   EP IMPLANTABLE DEVICE N/A 02/10/2016   Procedure: ICD Implant;  Surgeon: Will Meredith Leeds, MD;  Location: [facility];  Service: Cardiovascular;  Laterality: N/A;   ESOPHAGOGASTRODUODENOSCOPY (EGD) WITH PROPOFOL N/A 02/16/2017   Procedure: ESOPHAGOGASTRODUODENOSCOPY (EGD) WITH PROPOFOL;  Surgeon: Carol Ada, MD;  Location: [facility];  Service: Endoscopy;  Laterality: N/A;  reflux   IR IMAGING GUIDED PORT INSERTION  04/27/2020   IR IMAGING GUIDED PORT INSERTION  11/17/2022   MASTECTOMY Left    MASTECTOMY MODIFIED RADICAL Left 09/14/2020   Procedure: LEFT MASTECTOMY MODIFIED RADICAL;  Surgeon: Erroll Luna, MD;  Location: [facility];  Service: General;  Laterality: Left;  PEC BLOCK   POLYPECTOMY  08/12/2021   Procedure: POLYPECTOMY;  Surgeon: Carol Ada, MD;  Location: [facility];  Service: Endoscopy;;   TONSILLECTOMY      SOCIAL HISTORY: Social History   Socioeconomic History   Marital status: Widowed    Spouse name: Not on file   Number of children: Not on file  Years of education: Not on file   Highest education level: Not on file  Occupational History   Not on file  Tobacco Use   Smoking status: Every Day    Packs/day: 0.25    Years: 50.00    Total pack years: 12.50    Types: Cigarettes   Smokeless tobacco: Never   Tobacco comments:    3 puffs of each cigarette  Vaping Use   Vaping Use: Never used  Substance and Sexual Activity   Alcohol use: No   Drug use: No   Sexual activity: Not Currently  Other Topics Concern   Not on file  Social History Narrative   Not on file   Social Determinants of Health   Financial Resource Strain: Not on file  Food Insecurity: Not on file  Transportation Needs: Not on file  Physical Activity: Not on file  Stress: Not on file  Social Connections: Not on file  Intimate Partner Violence: Not on file    FAMILY  HISTORY: Family History  Problem Relation Age of Onset   Diabetes Mother    Stroke Mother    Diabetes Brother    Diabetes Maternal Grandmother    Diabetes Paternal Grandfather    Heart failure Paternal Grandfather    Hypertension Brother    Heart attack Neg Hx     Review of Systems  Constitutional:  Negative for appetite change, chills, fatigue, fever and unexpected weight change.  HENT:   Negative for hearing loss, lump/mass and trouble swallowing.   Eyes:  Negative for eye problems and icterus.  Respiratory:  Negative for chest tightness, cough and shortness of breath.   Cardiovascular:  Negative for chest pain, leg swelling and palpitations.  Gastrointestinal:  Negative for abdominal distention, abdominal pain, constipation, diarrhea, nausea and vomiting.  Endocrine: Negative for hot flashes.  Genitourinary:  Negative for difficulty urinating.   Musculoskeletal:  Negative for arthralgias.  Skin:  Negative for itching and rash.  Neurological:  Negative for dizziness, extremity weakness, headaches and numbness.  Hematological:  Negative for adenopathy. Does not bruise/bleed easily.  Psychiatric/Behavioral:  Negative for depression. The patient is not nervous/anxious.       PHYSICAL EXAMINATION  ECOG PERFORMANCE STATUS: 1 - Symptomatic but completely ambulatory  Vitals:   11/20/22 1028 11/20/22 1115  BP: (!) 187/128 (!) 182/88  Pulse: 97   Resp: 17   Temp: (!) 97.5 F (36.4 C)   SpO2: 96%     Physical Exam Constitutional:      General: She is not in acute distress.    Appearance: Normal appearance. She is not toxic-appearing.  HENT:     Head: Normocephalic and atraumatic.  Eyes:     General: No scleral icterus. Cardiovascular:     Rate and Rhythm: Normal rate and regular rhythm.     Pulses: Normal pulses.     Heart sounds: Normal heart sounds.  Pulmonary:     Effort: Pulmonary effort is normal.     Breath sounds: Normal breath sounds.  Abdominal:      General: Abdomen is flat. Bowel sounds are normal. There is no distension.     Palpations: Abdomen is soft.     Tenderness: There is no abdominal tenderness.  Musculoskeletal:        General: No swelling.     Cervical back: Neck supple.  Lymphadenopathy:     Cervical: No cervical adenopathy.  Skin:    General: Skin is warm and dry.     Findings:  No rash.  Neurological:     General: No focal deficit present.     Mental Status: She is alert.  Psychiatric:        Mood and Affect: Mood normal.        Behavior: Behavior normal.     LABORATORY DATA:  CBC    Component Value Date/Time   WBC 6.3 11/20/2022 0955   WBC 5.2 12/26/2021 0630   RBC 4.04 11/20/2022 0955   HGB 12.8 11/20/2022 0955   HGB 13.9 06/20/2012 1055   HCT 37.1 11/20/2022 0955   HCT 40.1 06/20/2012 1055   PLT 222 11/20/2022 0955   PLT 308 06/20/2012 1055   MCV 91.8 11/20/2022 0955   MCV 93 06/20/2012 1055   MCH 31.7 11/20/2022 0955   MCHC 34.5 11/20/2022 0955   RDW 14.4 11/20/2022 0955   RDW 14.3 06/20/2012 1055   LYMPHSABS 0.7 11/20/2022 0955   LYMPHSABS 2.1 06/20/2012 1055   MONOABS 0.8 11/20/2022 0955   MONOABS 0.6 06/20/2012 1055   EOSABS 0.0 11/20/2022 0955   EOSABS 0.0 06/20/2012 1055   BASOSABS 0.0 11/20/2022 0955   BASOSABS 0.1 06/20/2012 1055    CMP     Component Value Date/Time   NA 133 (L) 11/20/2022 0955   NA 141 06/20/2012 1055   K 4.6 11/20/2022 0955   K 3.6 06/20/2012 1055   CL 103 11/20/2022 0955   CL 109 (H) 06/20/2012 1055   CO2 24 11/20/2022 0955   CO2 25 06/20/2012 1055   GLUCOSE 295 (H) 11/20/2022 0955   GLUCOSE 173 (H) 06/20/2012 1055   BUN 13 11/20/2022 0955   BUN 12 06/20/2012 1055   CREATININE 0.81 11/20/2022 0955   CREATININE 0.92 05/09/2016 1053   CALCIUM 9.5 11/20/2022 0955   CALCIUM 9.0 06/20/2012 1055   PROT 6.3 (L) 11/20/2022 0955   ALBUMIN 3.7 11/20/2022 0955   AST 21 11/20/2022 0955   ALT 8 11/20/2022 0955   ALKPHOS 95 11/20/2022 0955   BILITOT 0.5  11/20/2022 0955   GFRNONAA >60 11/20/2022 0955   GFRNONAA >60 06/20/2012 1055   GFRAA >60 08/02/2020 0853   GFRAA >60 06/20/2012 1055         ASSESSMENT and THERAPY PLAN:   Malignant neoplasm of upper-outer quadrant of left breast in female, estrogen receptor positive ([place]) Colleen Lowery is a 82 year old woman with 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  Serenitee is doing moderately well today.  Her blood pressure is elevated however this is normal for her and being around the hand sanitizer that she has a sensitivity to.  I rechecked her blood pressure which was improved and she will proceed with [name] today.  Her labs today are stable she is eating and drinking well.  We discussed [name] potential side effects and she will keep a log of what she experiences over the next week and bring that information back so we can discuss further.  Janaiyah will proceed with treatment today and return in 1 week for labs, follow-up, and day 8 of therapy.  All questions were answered. The patient knows to call the clinic with any problems, questions or concerns. We can certainly see the patient much sooner if necessary.  Total encounter time:30 minutes*in face-to-face visit time, chart review, lab review, care coordination, order entry, and documentation of the encounter time.    Wilber Bihari, NP 11/20/22 11:20 AM Medical Oncology and  Hematology [facility] [place], [place] [address] Tel. [phone]    Fax. [phone]  *Total Encounter Time as defined by the Centers for Medicare and Medicaid Services includes, in addition to the face-to-face time of a patient visit (documented in the note above) non-face-to-face time: obtaining and reviewing outside history, ordering and reviewing medications, tests or procedures, care coordination (communications with other health  care professionals or caregivers) and documentation in the medical record.

## 2022-11-20 NOTE — Patient Instructions (Signed)
Sacituzumab Govitecan Injection What is this medication? SACITUZUMAB GOVITECAN (SAK i TOOZ ue mab GOE vi TEE kan) treats breast cancer. It may also be used to treat bladder cancer and kidney cancer. It works by blocking a protein that causes cancer cells to grow and multiply. This helps to slow or stop the spread of cancer cells. This medicine may be used for other purposes; ask your health care provider or pharmacist if you have questions. COMMON BRAND NAME(S): TRODELVY What should I tell my care team before I take this medication? They need to know if you have any of these conditions: Carry the UGT1A1*28 gene Infection Liver disease An unusual or allergic reaction to sacituzumab govitecan, other medications, foods, dyes, or preservatives Pregnant or trying to get pregnant Breast-feeding How should I use this medication? This medication is injected into a vein. It is given by your care team in a hospital or clinic setting. Talk to your care team about the use of this medication in children. Special care may be needed. Overdosage: If you think you have taken too much of this medicine contact a poison control center or emergency room at once. NOTE: This medicine is only for you. Do not share this medicine with others. What if I miss a dose? Keep appointments for follow-up doses. It is important not to miss your dose. Call your care team if you are unable to keep an appointment. What may interact with this medication? This medication may affect how other medications work, and other medications may affect the way this medication works. Talk with your care team about all of the medications you take. They may suggest changes to your treatment plan to lower the risk of side effects and to make sure your medications work as intended. This list may not describe all possible interactions. Give your health care provider a list of all the medicines, herbs, non-prescription drugs, or dietary supplements  you use. Also tell them if you smoke, drink alcohol, or use illegal drugs. Some items may interact with your medicine. What should I watch for while using this medication? This medication may make you feel generally unwell. This is not uncommon as chemotherapy can affect healthy cells as well as cancer cells. Report any side effects. Continue your course of treatment even though you feel ill unless your care team tells you to stop. You may need blood work while you are taking this medication. Certain genetic factors may decrease the safety of this medication. Your care team may use genetic tests to determine treatment. This medication can cause serious allergic reactions. To reduce your risk, your care team may give you other medications to take before receiving this one. Be sure to follow the directions from your care team. Check with your care team if you have severe diarrhea, nausea, and vomiting, or if you sweat a lot. The loss of too much body fluid may make it dangerous for you to take this medication. Talk to your care team if you wish to become pregnant or think you might be pregnant. This medication can cause serious birth defects if taken during pregnancy or if you get pregnant within 6 months after stopping treatment. A negative pregnancy test is required before starting this medication. A reliable form of contraception is recommended while taking this medication and for 6 months after stopping treatment. Talk to your care team about reliable forms of contraception. Use a condom during sex and for 3 months after stopping treatment. Tell your care team right  away if you think your partner might be pregnant. This medication can cause serious birth defects. Do not breast-feed while taking this medication and for 1 month after stopping therapy. This medication may cause infertility. Talk to your care team if you are concerned about your fertility. This medication may increase your risk of getting  an infection. Call your care team for advice if you get a fever, chills, sore throat, or other symptoms of a cold or flu. Do not treat yourself. Try to avoid being around people who are sick. Avoid taking medications that contain aspirin, acetaminophen, ibuprofen, naproxen, or ketoprofen unless instructed by your care team. These medications may hide a fever. This medication may increase blood sugar. The risk may be higher in patients who already have diabetes. Ask your care team what you can do to lower your risk of diabetes while taking this medication. What side effects may I notice from receiving this medication? Side effects that you should report to your care team as soon as possible: Allergic reactions--skin rash, itching, hives, swelling of the face, lips, tongue, or throat Infection--fever, chills, cough, or sore throat Infusion reactions--chest pain, shortness of breath or trouble breathing, feeling faint or lightheaded Low red blood cell level--unusual weakness or fatigue, dizziness, headache, trouble breathing Severe or prolonged diarrhea Side effects that usually do not require medical attention (report these to your care team if they continue or are bothersome): Constipation Diarrhea Fatigue Hair loss Loss of appetite Nausea Vomiting This list may not describe all possible side effects. Call your doctor for medical advice about side effects. You may report side effects to FDA at 1-800-FDA-1088. Where should I keep my medication? This medication is given in a hospital or clinic. It will not be stored at home. NOTE: This sheet is a summary. It may not cover all possible information. If you have questions about this medicine, talk to your doctor, pharmacist, or health care provider.  2023 Elsevier/Gold Standard (2021-12-29 00:00:00)

## 2022-11-20 NOTE — Patient Instructions (Signed)
[facility] ONCOLOGY  Discharge Instructions: Thank you for choosing [facility] to provide your oncology and hematology care.   If you have a lab appointment with the [facility], please go directly to the [facility] and check in at the registration area.   Wear comfortable clothing and clothing appropriate for easy access to any Portacath or PICC line.   We strive to give you quality time with your provider. You may need to reschedule your appointment if you arrive late (15 or more minutes).  Arriving late affects you and other patients whose appointments are after yours.  Also, if you miss three or more appointments without notifying the office, you may be dismissed from the clinic at the provider's discretion.      For prescription refill requests, have your pharmacy contact our office and allow 72 hours for refills to be completed.    Today you received the following chemotherapy and/or immunotherapy agent: [name]   To help prevent nausea and vomiting after your treatment, we encourage you to take your nausea medication as directed.  BELOW ARE SYMPTOMS THAT SHOULD BE REPORTED IMMEDIATELY: *FEVER GREATER THAN 100.4 F (38 C) OR HIGHER *CHILLS OR SWEATING *NAUSEA AND VOMITING THAT IS NOT CONTROLLED WITH YOUR NAUSEA MEDICATION *UNUSUAL SHORTNESS OF BREATH *UNUSUAL BRUISING OR BLEEDING *URINARY PROBLEMS (pain or burning when urinating, or frequent urination) *BOWEL PROBLEMS (unusual diarrhea, constipation, pain near the anus) TENDERNESS IN MOUTH AND THROAT WITH OR WITHOUT PRESENCE OF ULCERS (sore throat, sores in mouth, or a toothache) UNUSUAL RASH, SWELLING OR PAIN  UNUSUAL VAGINAL DISCHARGE OR ITCHING   Items with * indicate a potential emergency and should be followed up as soon as possible or go to the Emergency Department if any problems should occur.  Please show the CHEMOTHERAPY ALERT CARD or IMMUNOTHERAPY ALERT CARD at check-in to the  Emergency Department and triage nurse.  Should you have questions after your visit or need to cancel or reschedule your appointment, please contact [facility]  Dept: [phone]  and follow the prompts.  Office hours are 8:00 a.m. to 4:30 p.m. Monday - Friday. Please note that voicemails left after 4:00 p.m. may not be returned until the following business day.  We are closed weekends and major holidays. You have access to a nurse at all times for urgent questions. Please call the main number to the clinic Dept: [phone] and follow the prompts.   For any non-urgent questions, you may also contact your provider using MyChart. We now offer e-Visits for anyone 76 and older to request care online for non-urgent symptoms. For details visit [email or web address].   Also download the MyChart app! Go to the app store, search "MyChart", open the app, select [facility], and log in with your MyChart username and password.  Sacituzumab Govitecan Injection What is this medication? SACITUZUMAB GOVITECAN (SAK i TOOZ ue mab GOE vi TEE kan) treats breast cancer. It may also be used to treat bladder cancer and kidney cancer. It works by blocking a protein that causes cancer cells to grow and multiply. This helps to slow or stop the spread of cancer cells. This medicine may be used for other purposes; ask your health care provider or pharmacist if you have questions. COMMON BRAND NAME(S): TRODELVY What should I tell my care team before I take this medication? They need to know if you have any of these conditions: Carry the UGT1A1*28 gene Infection Liver disease An unusual  or allergic reaction to sacituzumab govitecan, other medications, foods, dyes, or preservatives Pregnant or trying to get pregnant Breast-feeding How should I use this medication? This medication is injected into a vein. It is given by your care team in a hospital or clinic setting. Talk to your care team  about the use of this medication in children. Special care may be needed. Overdosage: If you think you have taken too much of this medicine contact a poison control center or emergency room at once. NOTE: This medicine is only for you. Do not share this medicine with others. What if I miss a dose? Keep appointments for follow-up doses. It is important not to miss your dose. Call your care team if you are unable to keep an appointment. What may interact with this medication? This medication may affect how other medications work, and other medications may affect the way this medication works. Talk with your care team about all of the medications you take. They may suggest changes to your treatment plan to lower the risk of side effects and to make sure your medications work as intended. This list may not describe all possible interactions. Give your health care provider a list of all the medicines, herbs, non-prescription drugs, or dietary supplements you use. Also tell them if you smoke, drink alcohol, or use illegal drugs. Some items may interact with your medicine. What should I watch for while using this medication? This medication may make you feel generally unwell. This is not uncommon as chemotherapy can affect healthy cells as well as cancer cells. Report any side effects. Continue your course of treatment even though you feel ill unless your care team tells you to stop. You may need blood work while you are taking this medication. Certain genetic factors may decrease the safety of this medication. Your care team may use genetic tests to determine treatment. This medication can cause serious allergic reactions. To reduce your risk, your care team may give you other medications to take before receiving this one. Be sure to follow the directions from your care team. Check with your care team if you have severe diarrhea, nausea, and vomiting, or if you sweat a lot. The loss of too much body fluid may  make it dangerous for you to take this medication. Talk to your care team if you wish to become pregnant or think you might be pregnant. This medication can cause serious birth defects if taken during pregnancy or if you get pregnant within 6 months after stopping treatment. A negative pregnancy test is required before starting this medication. A reliable form of contraception is recommended while taking this medication and for 6 months after stopping treatment. Talk to your care team about reliable forms of contraception. Use a condom during sex and for 3 months after stopping treatment. Tell your care team right away if you think your partner might be pregnant. This medication can cause serious birth defects. Do not breast-feed while taking this medication and for 1 month after stopping therapy. This medication may cause infertility. Talk to your care team if you are concerned about your fertility. This medication may increase your risk of getting an infection. Call your care team for advice if you get a fever, chills, sore throat, or other symptoms of a cold or flu. Do not treat yourself. Try to avoid being around people who are sick. Avoid taking medications that contain aspirin, acetaminophen, ibuprofen, naproxen, or ketoprofen unless instructed by your care team. These medications may  hide a fever. This medication may increase blood sugar. The risk may be higher in patients who already have diabetes. Ask your care team what you can do to lower your risk of diabetes while taking this medication. What side effects may I notice from receiving this medication? Side effects that you should report to your care team as soon as possible: Allergic reactions--skin rash, itching, hives, swelling of the face, lips, tongue, or throat Infection--fever, chills, cough, or sore throat Infusion reactions--chest pain, shortness of breath or trouble breathing, feeling faint or lightheaded Low red blood cell  level--unusual weakness or fatigue, dizziness, headache, trouble breathing Severe or prolonged diarrhea Side effects that usually do not require medical attention (report these to your care team if they continue or are bothersome): Constipation Diarrhea Fatigue Hair loss Loss of appetite Nausea Vomiting This list may not describe all possible side effects. Call your doctor for medical advice about side effects. You may report side effects to FDA at 1-800-FDA-1088. Where should I keep my medication? This medication is given in a hospital or clinic. It will not be stored at home. NOTE: This sheet is a summary. It may not cover all possible information. If you have questions about this medicine, talk to your doctor, pharmacist, or health care provider.  2023 Elsevier/Gold Standard (2021-12-29 00:00:00)

## 2022-11-21 ENCOUNTER — Telehealth: Payer: Self-pay | Admitting: *Deleted

## 2022-11-21 LAB — CANCER ANTIGEN 27.29: CA 27.29: 88.1 U/mL — ABNORMAL HIGH (ref 0.0–38.6)

## 2022-11-21 NOTE — Telephone Encounter (Signed)
-----   Message from Clyda Hurdle, RN sent at 11/20/2022  4:59 PM EST ----- Regarding: 1st Time Trodelvy-Dr Lindi Adie Call back-1st Time Colleen Lowery Dr [name] patient. No issues during treatment.

## 2022-11-21 NOTE — Telephone Encounter (Signed)
Pt called back & states she is doing well.  She denies N/V, diarrhea/constipation.  She reports sleeping well & has not taken any nausea meds.  She reported using her EMLA cream to her port with treatment yest.  Reminded not to use again until 3rd treatment & explained that it might make her glue come off.  Informed that we can use some ice next visit.  This was repeated several times & I think she understands.  She states she knows how to reach [place] & knows her next appts.

## 2022-11-21 NOTE — Telephone Encounter (Signed)
Called pt to see how she did with her recent treatment.  LMTCB.

## 2022-11-22 ENCOUNTER — Telehealth: Payer: Self-pay | Admitting: Hematology and Oncology

## 2022-11-22 NOTE — Telephone Encounter (Signed)
Scheduled appointments per 1/8 los. Left voicemail.

## 2022-11-23 ENCOUNTER — Other Ambulatory Visit: Payer: Self-pay

## 2022-11-24 MED FILL — Dexamethasone Sodium Phosphate Inj 100 MG/10ML: INTRAMUSCULAR | Qty: 1 | Status: AC

## 2022-11-24 MED FILL — Fosaprepitant Dimeglumine For IV Infusion 150 MG (Base Eq): INTRAVENOUS | Qty: 5 | Status: AC

## 2022-11-27 ENCOUNTER — Other Ambulatory Visit: Payer: Self-pay

## 2022-11-27 ENCOUNTER — Inpatient Hospital Stay (HOSPITAL_BASED_OUTPATIENT_CLINIC_OR_DEPARTMENT_OTHER): Payer: Medicare HMO | Admitting: Adult Health

## 2022-11-27 ENCOUNTER — Inpatient Hospital Stay: Payer: Medicare HMO

## 2022-11-27 ENCOUNTER — Encounter: Payer: Self-pay | Admitting: Adult Health

## 2022-11-27 VITALS — BP 164/84 | HR 72

## 2022-11-27 DIAGNOSIS — Z95828 Presence of other vascular implants and grafts: Secondary | ICD-10-CM

## 2022-11-27 DIAGNOSIS — Z170 Estrogen receptor positive status [ER+]: Secondary | ICD-10-CM

## 2022-11-27 DIAGNOSIS — C50412 Malignant neoplasm of upper-outer quadrant of left female breast: Secondary | ICD-10-CM

## 2022-11-27 DIAGNOSIS — Z5112 Encounter for antineoplastic immunotherapy: Secondary | ICD-10-CM | POA: Diagnosis not present

## 2022-11-27 LAB — PHOSPHORUS: Phosphorus: 4.9 mg/dL — ABNORMAL HIGH (ref 2.5–4.6)

## 2022-11-27 LAB — CMP (CANCER CENTER ONLY)
ALT: 9 U/L (ref 0–44)
AST: 12 U/L — ABNORMAL LOW (ref 15–41)
Albumin: 3.5 g/dL (ref 3.5–5.0)
Alkaline Phosphatase: 88 U/L (ref 38–126)
Anion gap: 7 (ref 5–15)
BUN: 14 mg/dL (ref 8–23)
CO2: 27 mmol/L (ref 22–32)
Calcium: 9.7 mg/dL (ref 8.9–10.3)
Chloride: 102 mmol/L (ref 98–111)
Creatinine: 0.77 mg/dL (ref 0.44–1.00)
GFR, Estimated: >60 mL/min (ref >60)
Glucose, Bld: 245 mg/dL — ABNORMAL HIGH (ref 70–99)
Potassium: 4 mmol/L (ref 3.5–5.1)
Sodium: 136 mmol/L (ref 135–145)
Total Bilirubin: 0.4 mg/dL (ref 0.3–1.2)
Total Protein: 6.6 g/dL (ref 6.5–8.1)

## 2022-11-27 LAB — CBC WITH DIFFERENTIAL (CANCER CENTER ONLY)
Abs Immature Granulocytes: 0.06 10*3/uL (ref 0.00–0.07)
Basophils Absolute: 0 10*3/uL (ref 0.0–0.1)
Basophils Relative: 1 %
Eosinophils Absolute: 0 10*3/uL (ref 0.0–0.5)
Eosinophils Relative: 1 %
HCT: 35.5 % — ABNORMAL LOW (ref 36.0–46.0)
Hemoglobin: 12.7 g/dL (ref 12.0–15.0)
Immature Granulocytes: 1 %
Lymphocytes Relative: 15 %
Lymphs Abs: 0.7 10*3/uL (ref 0.7–4.0)
MCH: 32.2 pg (ref 26.0–34.0)
MCHC: 35.8 g/dL (ref 30.0–36.0)
MCV: 90.1 fL (ref 80.0–100.0)
Monocytes Absolute: 0.5 10*3/uL (ref 0.1–1.0)
Monocytes Relative: 10 %
Neutro Abs: 3.6 10*3/uL (ref 1.7–7.7)
Neutrophils Relative %: 72 %
Platelet Count: 207 10*3/uL (ref 150–400)
RBC: 3.94 MIL/uL (ref 3.87–5.11)
RDW: 14.4 % (ref 11.5–15.5)
WBC Count: 4.9 10*3/uL (ref 4.0–10.5)
nRBC: 0 % (ref 0.0–0.2)

## 2022-11-27 LAB — MAGNESIUM: Magnesium: 1.8 mg/dL (ref 1.7–2.4)

## 2022-11-27 MED ORDER — SODIUM CHLORIDE 0.9% FLUSH
10.0000 mL | Freq: Once | INTRAVENOUS | Status: AC
  Administered 2022-11-27: 10 mL

## 2022-11-27 MED ORDER — PALONOSETRON HCL INJECTION 0.25 MG/5ML
0.2500 mg | Freq: Once | INTRAVENOUS | Status: AC
  Administered 2022-11-27: 0.25 mg via INTRAVENOUS
  Filled 2022-11-27: qty 5

## 2022-11-27 MED ORDER — SODIUM CHLORIDE 0.9 % IV SOLN
5.8000 mg/kg | Freq: Once | INTRAVENOUS | Status: AC
  Administered 2022-11-27: 360 mg via INTRAVENOUS
  Filled 2022-11-27: qty 36

## 2022-11-27 MED ORDER — HEPARIN SOD (PORK) LOCK FLUSH 100 UNIT/ML IV SOLN
500.0000 [IU] | Freq: Once | INTRAVENOUS | Status: AC | PRN
  Administered 2022-11-27: 500 [IU]

## 2022-11-27 MED ORDER — SODIUM CHLORIDE 0.9 % IV SOLN
150.0000 mg | Freq: Once | INTRAVENOUS | Status: AC
  Administered 2022-11-27: 150 mg via INTRAVENOUS
  Filled 2022-11-27: qty 150

## 2022-11-27 MED ORDER — SODIUM CHLORIDE 0.9% FLUSH
10.0000 mL | INTRAVENOUS | Status: DC | PRN
  Administered 2022-11-27: 10 mL

## 2022-11-27 MED ORDER — SODIUM CHLORIDE 0.9 % IV SOLN
10.0000 mg | Freq: Once | INTRAVENOUS | Status: AC
  Administered 2022-11-27: 10 mg via INTRAVENOUS
  Filled 2022-11-27: qty 10

## 2022-11-27 MED ORDER — ACETAMINOPHEN 325 MG PO TABS
650.0000 mg | ORAL_TABLET | Freq: Once | ORAL | Status: AC
  Administered 2022-11-27: 650 mg via ORAL
  Filled 2022-11-27: qty 2

## 2022-11-27 MED ORDER — CETIRIZINE HCL 10 MG/ML IV SOLN
5.0000 mg | Freq: Once | INTRAVENOUS | Status: AC
  Administered 2022-11-27: 5 mg via INTRAVENOUS
  Filled 2022-11-27: qty 1

## 2022-11-27 MED ORDER — FAMOTIDINE IN NACL 20-0.9 MG/50ML-% IV SOLN
20.0000 mg | Freq: Once | INTRAVENOUS | Status: AC
  Administered 2022-11-27: 20 mg via INTRAVENOUS
  Filled 2022-11-27: qty 50

## 2022-11-27 MED ORDER — SODIUM CHLORIDE 0.9 % IV SOLN: Freq: Once | INTRAVENOUS | Status: AC

## 2022-11-27 NOTE — Patient Instructions (Signed)
[facility] ONCOLOGY  Discharge Instructions: Thank you for choosing [facility] to provide your oncology and hematology care.   If you have a lab appointment with the [facility], please go directly to the [facility] and check in at the registration area.   Wear comfortable clothing and clothing appropriate for easy access to any Portacath or PICC line.   We strive to give you quality time with your provider. You may need to reschedule your appointment if you arrive late (15 or more minutes).  Arriving late affects you and other patients whose appointments are after yours.  Also, if you miss three or more appointments without notifying the office, you may be dismissed from the clinic at the provider's discretion.      For prescription refill requests, have your pharmacy contact our office and allow 72 hours for refills to be completed.    Today you received the following chemotherapy and/or immunotherapy agent: Colleen Lowery   To help prevent nausea and vomiting after your treatment, we encourage you to take your nausea medication as directed.  BELOW ARE SYMPTOMS THAT SHOULD BE REPORTED IMMEDIATELY: *FEVER GREATER THAN 100.4 F (38 C) OR HIGHER *CHILLS OR SWEATING *NAUSEA AND VOMITING THAT IS NOT CONTROLLED WITH YOUR NAUSEA MEDICATION *UNUSUAL SHORTNESS OF BREATH *UNUSUAL BRUISING OR BLEEDING *URINARY PROBLEMS (pain or burning when urinating, or frequent urination) *BOWEL PROBLEMS (unusual diarrhea, constipation, pain near the anus) TENDERNESS IN MOUTH AND THROAT WITH OR WITHOUT PRESENCE OF ULCERS (sore throat, sores in mouth, or a toothache) UNUSUAL RASH, SWELLING OR PAIN  UNUSUAL VAGINAL DISCHARGE OR ITCHING   Items with * indicate a potential emergency and should be followed up as soon as possible or go to the Emergency Department if any problems should occur.  Please show the CHEMOTHERAPY ALERT CARD or IMMUNOTHERAPY ALERT CARD at check-in to the  Emergency Department and triage nurse.  Should you have questions after your visit or need to cancel or reschedule your appointment, please contact [facility]  Dept: [phone]  and follow the prompts.  Office hours are 8:00 a.m. to 4:30 p.m. Monday - Friday. Please note that voicemails left after 4:00 p.m. may not be returned until the following business day.  We are closed weekends and major holidays. You have access to a nurse at all times for urgent questions. Please call the main number to the clinic Dept: [phone] and follow the prompts.   For any non-urgent questions, you may also contact your provider using MyChart. We now offer e-Visits for anyone 22 and older to request care online for non-urgent symptoms. For details visit [email or web address].   Also download the MyChart app! Go to the app store, search "MyChart", open the app, select , and log in with your MyChart username and password.  Sacituzumab Govitecan Injection What is this medication? SACITUZUMAB GOVITECAN (SAK i TOOZ ue mab GOE vi TEE kan) treats breast cancer. It may also be used to treat bladder cancer and kidney cancer. It works by blocking a protein that causes cancer cells to grow and multiply. This helps to slow or stop the spread of cancer cells. This medicine may be used for other purposes; ask your health care provider or pharmacist if you have questions. COMMON BRAND NAME(S): TRODELVY What should I tell my care team before I take this medication? They need to know if you have any of these conditions: Carry the UGT1A1*28 gene Infection Liver disease An unusual  or allergic reaction to sacituzumab govitecan, other medications, foods, dyes, or preservatives Pregnant or trying to get pregnant Breast-feeding How should I use this medication? This medication is injected into a vein. It is given by your care team in a hospital or clinic setting. Talk to your care team  about the use of this medication in children. Special care may be needed. Overdosage: If you think you have taken too much of this medicine contact a poison control center or emergency room at once. NOTE: This medicine is only for you. Do not share this medicine with others. What if I miss a dose? Keep appointments for follow-up doses. It is important not to miss your dose. Call your care team if you are unable to keep an appointment. What may interact with this medication? This medication may affect how other medications work, and other medications may affect the way this medication works. Talk with your care team about all of the medications you take. They may suggest changes to your treatment plan to lower the risk of side effects and to make sure your medications work as intended. This list may not describe all possible interactions. Give your health care provider a list of all the medicines, herbs, non-prescription drugs, or dietary supplements you use. Also tell them if you smoke, drink alcohol, or use illegal drugs. Some items may interact with your medicine. What should I watch for while using this medication? This medication may make you feel generally unwell. This is not uncommon as chemotherapy can affect healthy cells as well as cancer cells. Report any side effects. Continue your course of treatment even though you feel ill unless your care team tells you to stop. You may need blood work while you are taking this medication. Certain genetic factors may decrease the safety of this medication. Your care team may use genetic tests to determine treatment. This medication can cause serious allergic reactions. To reduce your risk, your care team may give you other medications to take before receiving this one. Be sure to follow the directions from your care team. Check with your care team if you have severe diarrhea, nausea, and vomiting, or if you sweat a lot. The loss of too much body fluid may  make it dangerous for you to take this medication. Talk to your care team if you wish to become pregnant or think you might be pregnant. This medication can cause serious birth defects if taken during pregnancy or if you get pregnant within 6 months after stopping treatment. A negative pregnancy test is required before starting this medication. A reliable form of contraception is recommended while taking this medication and for 6 months after stopping treatment. Talk to your care team about reliable forms of contraception. Use a condom during sex and for 3 months after stopping treatment. Tell your care team right away if you think your partner might be pregnant. This medication can cause serious birth defects. Do not breast-feed while taking this medication and for 1 month after stopping therapy. This medication may cause infertility. Talk to your care team if you are concerned about your fertility. This medication may increase your risk of getting an infection. Call your care team for advice if you get a fever, chills, sore throat, or other symptoms of a cold or flu. Do not treat yourself. Try to avoid being around people who are sick. Avoid taking medications that contain aspirin, acetaminophen, ibuprofen, naproxen, or ketoprofen unless instructed by your care team. These medications may  hide a fever. This medication may increase blood sugar. The risk may be higher in patients who already have diabetes. Ask your care team what you can do to lower your risk of diabetes while taking this medication. What side effects may I notice from receiving this medication? Side effects that you should report to your care team as soon as possible: Allergic reactions--skin rash, itching, hives, swelling of the face, lips, tongue, or throat Infection--fever, chills, cough, or sore throat Infusion reactions--chest pain, shortness of breath or trouble breathing, feeling faint or lightheaded Low red blood cell  level--unusual weakness or fatigue, dizziness, headache, trouble breathing Severe or prolonged diarrhea Side effects that usually do not require medical attention (report these to your care team if they continue or are bothersome): Constipation Diarrhea Fatigue Hair loss Loss of appetite Nausea Vomiting This list may not describe all possible side effects. Call your doctor for medical advice about side effects. You may report side effects to FDA at 1-800-FDA-1088. Where should I keep my medication? This medication is given in a hospital or clinic. It will not be stored at home. NOTE: This sheet is a summary. It may not cover all possible information. If you have questions about this medicine, talk to your doctor, pharmacist, or health care provider.  2023 Elsevier/Gold Standard (2021-12-29 00:00:00)

## 2022-11-27 NOTE — Progress Notes (Signed)
Per Mendel Ryder NP ok to treat with elevated BP of 186/89 today.

## 2022-11-27 NOTE — Assessment & Plan Note (Signed)
Rayola is a 82 year old woman with stage IV breast cancer on treatment with [place].  Sable will proceed with cycle 1 day 8 of Trodelvy therapy.  We reviewed her foggy headedness and significant fatigue today following her treatment.  I reviewed her premedications 1 of which is IV cetirizine.  I decreased IV cetirizine to 5 mg.  I also consulted with Teldrin our pharmacist who agreed and side effect profile of IV cetirizine and being a likely culprit of her side effects.  Pilar's complete blood count has returned and is normal.  We are awaiting her c-Met.  She will proceed with treatment so long as her labs are within parameters.    For her constipation I recommended she go ahead and start taking Colace.  She is drinking plenty of water.  We will see her back in 2 weeks for labs, follow-up, and cycle 2 of Trodelvy.  She knows to call for any questions or concerns that may arise between now and her next visit.

## 2022-11-27 NOTE — Progress Notes (Signed)
[facility] Follow up:    Colleen Jude, MD [place] [address]   DIAGNOSIS:  Cancer Staging  Malignant neoplasm of upper-outer quadrant of left breast in female, estrogen receptor positive ([place]) Staging form: Breast, AJCC 8th Edition - Clinical stage from 04/07/2020: Stage IIIB (cT4b, cN1, cM0, G2, ER+, PR+, HER2-) - Signed by Nicholas Lose, MD on 04/07/2020 Stage prefix: Initial diagnosis Histologic grading system: 3 grade system - Pathologic stage from 09/14/2020: No Stage Recommended (ypT3, pN3a, cM0, G2, ER+, PR+, HER2-) - Unsigned Stage prefix: Post-therapy Histologic grading system: 3 grade system - Pathologic: Stage IV (pM1) - Signed by Gardenia Phlegm, NP on 11/20/2022   SUMMARY OF ONCOLOGIC HISTORY: Oncology History  Malignant neoplasm of upper-outer quadrant of left breast in female, estrogen receptor positive ([place])  03/22/2020 Initial Diagnosis   Diffuse left breast swelling. skin thickening and two areas of irregular hypoechogenicity in the 2-3 o'clock region, 4.9cm, and at least 4 abnormal lymph nodes  with cortical thickening. 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    10/28/2020 - 12/17/2020 Radiation Therapy   The patient initially received a dose of 50.4 Gy in 28 fractions to the left breast and supraclavicular region using whole-breast tangent fields. This was delivered using a 3-D conformal technique. The pt received a boost  delivering an additional 10 Gy in 5 fractions using a electron boost with 22mV electrons. The total dose was 60.4 Gy.   11/2020 - 11/2027 Anti-estrogen oral therapy   Anastrozole   01/30/2022 Miscellaneous   Guardant360:ERBB2 (exon 20 insertion): Benefit from Enhertu, MSI high not detected   11/20/2022 Cancer Staging   Staging form: Breast, AJCC 8th Edition - Pathologic: Stage IV (pM1) - Signed by CGardenia Phlegm NP on 11/20/2022   11/20/2022 -  Chemotherapy   Patient is on Treatment Plan : BREAST METASTATIC Sacituzumab govitecan-hziy (Colleen Lowery D1,8 q21d       CURRENT THERAPY: TIvette Lowery INTERVAL HISTORY: Colleen Lowery.o. female returns for follow-up and evaluation prior to receiving cycle 1 day 8 of [place]  She receives this treatment on days 1 and 8 of a 21-day cycle.  Colleen Lowery tolerated her treatment moderately well.  She did notice the day following her treatment she was very sleepy and slept the majority of the day.  She noticed also that during this day she felt very foggy headed which is slowly resolving.  The next day she improved and today she feels back to her normal self.  Colleen Lowery an extreme sensitivity/allergy to our purel hand sanitizer.  She notes that once exposed it does cause elevations in her blood pressure.  Colleen Lowery that today she is feeling constipated.  She has not taken anything for it but typically takes Colace with good results.   Patient Active Problem List   Diagnosis Date Noted   Metastases to the liver ([facility] 11/20/2022   Port-A-Cath in place 05/31/2020   Malignant neoplasm of upper-outer quadrant of  left breast in female, estrogen receptor positive ([place]) 04/07/2020   Peripheral arterial disease ([place]) 06/14/2016   Upper airway cough syndrome 06/08/2016   CAD in native artery 05/31/2016   Chronic combined systolic and diastolic congestive heart failure ([place]) 05/31/2016   VT (ventricular tachycardia) ([place]) 02/09/2016   Acute on chronic combined  systolic and diastolic HF (heart failure) ([place]) 02/09/2016   Contrast media allergy 02/09/2016   Dyspnea 02/07/2016   Benign essential HTN 02/07/2016   Cigarette smoker 02/07/2016   Diabetes ([place]) 08/02/2014   Essential hypertension 08/02/2014    is allergic to alcohol, contrast media [iodinated contrast media], iodine, atorvastatin, dust mite extract, latex, lisinopril, and molds & smuts.  MEDICAL HISTORY: Past Medical History:  Diagnosis Date   AICD (automatic cardioverter/defibrillator) present    Allergy    Arthritis    FINGERS   Cancer ([place])    left breast   Cataract    right   Crohn disease ([place])    Depression    Diabetes mellitus without complication ([place])    TYPE 2 , TAKING OTC MED FORGYMEMA   GERD (gastroesophageal reflux disease)    Headache    sinus   Heart failure, acute, systolic and diastolic ([place]) MARCH 5956 DX   History of colonic polyps    Hypertension    Hypothyroidism    per patient, takes OTC meds when temperature drops low   IBS (irritable bowel syndrome)    Metastases to the liver ([place]) 11/20/2022   Seizures ([place])    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, 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   Thyroid disease    AGE 57'S UNTIL  2016 OFF ALL THRYOID MEDS NOW    Wears dentures    Wears glasses     SURGICAL HISTORY: Past Surgical History:  Procedure Laterality Date   ABDOMINAL HYSTERECTOMY     PARTIAL   BREAST BIOPSY Left 04/01/2019   INVASIVE DUCTAL CARCINOMA   BREAST SURGERY     CARDIAC CATHETERIZATION  02/10/2016   Procedure: Right/Left Heart Cath and Coronary Angiography;  Surgeon: Leonie Man, MD;  Location: [facility];  Service: Cardiovascular;;   CATARACT EXTRACTION W/ INTRAOCULAR LENS IMPLANT     left   COLONOSCOPY WITH PROPOFOL N/A 02/16/2017   Procedure: COLONOSCOPY WITH PROPOFOL;  Surgeon: Carol Ada, MD;  Location: [facility];  Service: Endoscopy;  Laterality: N/A;   COLONOSCOPY WITH PROPOFOL N/A 08/12/2021   Procedure: COLONOSCOPY WITH  PROPOFOL;  Surgeon: Carol Ada, MD;  Location: [facility];  Service: Endoscopy;  Laterality: N/A;   EP IMPLANTABLE DEVICE N/A 02/10/2016   Procedure: ICD Implant;  Surgeon: Will Meredith Leeds, MD;  Location: [facility];  Service: Cardiovascular;  Laterality: N/A;   ESOPHAGOGASTRODUODENOSCOPY (EGD) WITH PROPOFOL N/A 02/16/2017   Procedure: ESOPHAGOGASTRODUODENOSCOPY (EGD) WITH PROPOFOL;  Surgeon: Carol Ada, MD;  [facility]: [facility];  Service: Endoscopy;  Laterality: N/A;  reflux   IR IMAGING GUIDED PORT INSERTION  04/27/2020   IR IMAGING GUIDED PORT INSERTION  11/17/2022   MASTECTOMY Left    MASTECTOMY MODIFIED RADICAL Left 09/14/2020   Procedure: LEFT MASTECTOMY MODIFIED RADICAL;  Surgeon: Erroll Luna, MD;  Location: [facility];  Service: General;  Laterality: Left;  PEC BLOCK   POLYPECTOMY  08/12/2021   Procedure: POLYPECTOMY;  Surgeon: Carol Ada, MD;  Location: [facility];  Service: Endoscopy;;   TONSILLECTOMY      SOCIAL HISTORY: Social History   Socioeconomic History   Marital status: Widowed    Spouse name:  Not on file   Number of children: Not on file   Years of education: Not on file   Highest education level: Not on file  Occupational History   Not on file  Tobacco Use   Smoking status: Every Day    Packs/day: 0.25    Years: 50.00    Total pack years: 12.50    Types: Cigarettes   Smokeless tobacco: Never   Tobacco comments:    3 puffs of each cigarette  Vaping Use   Vaping Use: Never used  Substance and Sexual Activity   Alcohol use: No   Drug use: No   Sexual activity: Not Currently  Other Topics Concern   Not on file  Social History Narrative   Not on file   Social Determinants of Health   Financial Resource Strain: Not on file  Food Insecurity: Not on file  Transportation Needs: Not on file  Physical Activity: Not on file  Stress: Not on file  Social Connections: Not on file  Intimate Partner Violence: Not on file    FAMILY  HISTORY: Family History  Problem Relation Age of Onset   Diabetes Mother    Stroke Mother    Diabetes Brother    Diabetes Maternal Grandmother    Diabetes Paternal Grandfather    Heart failure Paternal Grandfather    Hypertension Brother    Heart attack Neg Hx     Review of Systems  Constitutional:  Negative for appetite change, chills, fatigue, fever and unexpected weight change.  HENT:   Negative for hearing loss, lump/mass and trouble swallowing.   Eyes:  Negative for eye problems and icterus.  Respiratory:  Negative for chest tightness, cough and shortness of breath.   Cardiovascular:  Negative for chest pain, leg swelling and palpitations.  Gastrointestinal:  Negative for abdominal distention, abdominal pain, constipation, diarrhea, nausea and vomiting.  Endocrine: Negative for hot flashes.  Genitourinary:  Negative for difficulty urinating.   Musculoskeletal:  Negative for arthralgias.  Skin:  Negative for itching and rash.  Neurological:  Negative for dizziness, extremity weakness, headaches and numbness.  Hematological:  Negative for adenopathy. Does not bruise/bleed easily.  Psychiatric/Behavioral:  Negative for depression. The patient is not nervous/anxious.       PHYSICAL EXAMINATION  ECOG PERFORMANCE STATUS: 1 - Symptomatic but completely ambulatory  Vitals:   11/27/22 1007  BP: (!) 174/104  Pulse: 94  Resp: 16  Temp: (!) 97.5 F (36.4 C)  SpO2: 100%    Physical Exam Constitutional:      General: She is not in acute distress.    Appearance: Normal appearance. She is not toxic-appearing.  HENT:     Head: Normocephalic and atraumatic.  Eyes:     General: No scleral icterus. Cardiovascular:     Rate and Rhythm: Normal rate and regular rhythm.     Pulses: Normal pulses.     Heart sounds: Normal heart sounds.  Pulmonary:     Effort: Pulmonary effort is normal.     Breath sounds: Normal breath sounds.  Abdominal:     General: Abdomen is flat. Bowel  sounds are normal. There is no distension.     Palpations: Abdomen is soft.     Tenderness: There is no abdominal tenderness.  Musculoskeletal:        General: No swelling.     Cervical back: Neck supple.  Lymphadenopathy:     Cervical: No cervical adenopathy.  Skin:    General: Skin is warm and dry.  Findings: No rash.  Neurological:     General: No focal deficit present.     Mental Status: She is alert.  Psychiatric:        Mood and Affect: Mood normal.        Behavior: Behavior normal.     LABORATORY DATA:  CBC    Component Value Date/Time   WBC 4.9 11/27/2022 0952   WBC 5.2 12/26/2021 0630   RBC 3.94 11/27/2022 0952   HGB 12.7 11/27/2022 0952   HGB 13.9 06/20/2012 1055   HCT 35.5 (L) 11/27/2022 0952   HCT 40.1 06/20/2012 1055   PLT 207 11/27/2022 0952   PLT 308 06/20/2012 1055   MCV 90.1 11/27/2022 0952   MCV 93 06/20/2012 1055   MCH 32.2 11/27/2022 0952   MCHC 35.8 11/27/2022 0952   RDW 14.4 11/27/2022 0952   RDW 14.3 06/20/2012 1055   LYMPHSABS 0.7 11/27/2022 0952   LYMPHSABS 2.1 06/20/2012 1055   MONOABS 0.5 11/27/2022 0952   MONOABS 0.6 06/20/2012 1055   EOSABS 0.0 11/27/2022 0952   EOSABS 0.0 06/20/2012 1055   BASOSABS 0.0 11/27/2022 0952   BASOSABS 0.1 06/20/2012 1055    CMP     Component Value Date/Time   NA 136 11/27/2022 0952   NA 141 06/20/2012 1055   K 4.0 11/27/2022 0952   K 3.6 06/20/2012 1055   CL 102 11/27/2022 0952   CL 109 (H) 06/20/2012 1055   CO2 27 11/27/2022 0952   CO2 25 06/20/2012 1055   GLUCOSE 245 (H) 11/27/2022 0952   GLUCOSE 173 (H) 06/20/2012 1055   BUN 14 11/27/2022 0952   BUN 12 06/20/2012 1055   CREATININE 0.77 11/27/2022 0952   CREATININE 0.92 05/09/2016 1053   CALCIUM 9.7 11/27/2022 0952   CALCIUM 9.0 06/20/2012 1055   PROT 6.6 11/27/2022 0952   ALBUMIN 3.5 11/27/2022 0952   AST 12 (L) 11/27/2022 0952   ALT 9 11/27/2022 0952   ALKPHOS 88 11/27/2022 0952   BILITOT 0.4 11/27/2022 0952   GFRNONAA >60  11/27/2022 0952   GFRNONAA >60 06/20/2012 1055   GFRAA >60 08/02/2020 0853   GFRAA >60 06/20/2012 1055      ASSESSMENT and THERAPY PLAN:   Malignant neoplasm of upper-outer quadrant of left breast in female, estrogen receptor positive ([place]) Colleen Lowery is a 82 year old woman with stage IV breast cancer on treatment with [place].  Colleen Lowery will proceed with cycle 1 day 8 of Trodelvy therapy.  We reviewed her foggy headedness and significant fatigue today following her treatment.  I reviewed her premedications 1 of which is IV cetirizine.  I decreased IV cetirizine to 5 mg.  I also consulted with Colleen Lowery our pharmacist who agreed and side effect profile of IV cetirizine and being a likely culprit of her side effects.  Colleen Lowery's complete blood count has returned and is normal.  We are awaiting her c-Met.  She will proceed with treatment so long as her labs are within parameters.    For her constipation I recommended she go ahead and start taking Colace.  She is drinking plenty of water.  We will see her back in 2 weeks for labs, follow-up, and cycle 2 of Trodelvy.  She knows to call for any questions or concerns that may arise between now and her next visit.   All questions were answered. The patient knows to call the clinic with any problems, questions or concerns. We can certainly see the patient much sooner if necessary.  Total  encounter time:20 minutes*in face-to-face visit time, chart review, lab review, care coordination, order entry, and documentation of the encounter time.   Wilber Bihari, NP 11/27/22 10:39 AM Medical Oncology and Hematology [facility] [place], [place] [address] Tel. [phone]    Fax. [phone]  *Total Encounter Time as defined by the Centers for Medicare and Medicaid Services includes, in addition to the face-to-face time of a patient visit (documented in the note above) non-face-to-face time: obtaining and reviewing outside history,  ordering and reviewing medications, tests or procedures, care coordination (communications with other health care professionals or caregivers) and documentation in the medical record.

## 2022-11-28 LAB — CANCER ANTIGEN 27.29: CA 27.29: 92 U/mL — ABNORMAL HIGH (ref 0.0–38.6)

## 2022-12-08 MED FILL — Dexamethasone Sodium Phosphate Inj 100 MG/10ML: INTRAMUSCULAR | Qty: 1 | Status: AC

## 2022-12-08 MED FILL — Fosaprepitant Dimeglumine For IV Infusion 150 MG (Base Eq): INTRAVENOUS | Qty: 5 | Status: AC

## 2022-12-08 NOTE — Addendum Note (Signed)
Encounter addended by: Wolfgang Phoenix on: 12/08/2022 7:46 AM  Actions taken: Imaging Exam ended

## 2022-12-11 ENCOUNTER — Other Ambulatory Visit: Payer: Self-pay

## 2022-12-11 ENCOUNTER — Inpatient Hospital Stay: Payer: Medicare HMO

## 2022-12-11 ENCOUNTER — Inpatient Hospital Stay (HOSPITAL_BASED_OUTPATIENT_CLINIC_OR_DEPARTMENT_OTHER): Payer: Medicare HMO | Admitting: Physician Assistant

## 2022-12-11 ENCOUNTER — Emergency Department (HOSPITAL_COMMUNITY): Payer: Medicare HMO

## 2022-12-11 ENCOUNTER — Inpatient Hospital Stay: Payer: Medicare HMO | Admitting: Hematology and Oncology

## 2022-12-11 ENCOUNTER — Emergency Department (HOSPITAL_COMMUNITY)
Admission: EM | Admit: 2022-12-11 | Discharge: 2022-12-11 | Disposition: A | Discharge status: Home / Self Care | Payer: Medicare HMO | Attending: Emergency Medicine | Admitting: Emergency Medicine

## 2022-12-11 VITALS — BP 190/79 | HR 82 | Resp 16

## 2022-12-11 VITALS — BP 201/91 | HR 86 | Temp 98.1°F | Resp 15 | Wt 138.0 lb

## 2022-12-11 DIAGNOSIS — C50412 Malignant neoplasm of upper-outer quadrant of left female breast: Secondary | ICD-10-CM

## 2022-12-11 DIAGNOSIS — Z170 Estrogen receptor positive status [ER+]: Secondary | ICD-10-CM

## 2022-12-11 DIAGNOSIS — Z1152 Encounter for screening for COVID-19: Secondary | ICD-10-CM | POA: Insufficient documentation

## 2022-12-11 DIAGNOSIS — C787 Secondary malignant neoplasm of liver and intrahepatic bile duct: Secondary | ICD-10-CM

## 2022-12-11 DIAGNOSIS — R251 Tremor, unspecified: Secondary | ICD-10-CM | POA: Diagnosis not present

## 2022-12-11 DIAGNOSIS — Z91040 Latex allergy status: Secondary | ICD-10-CM | POA: Diagnosis not present

## 2022-12-11 DIAGNOSIS — E119 Type 2 diabetes mellitus without complications: Secondary | ICD-10-CM | POA: Insufficient documentation

## 2022-12-11 DIAGNOSIS — R058 Other specified cough: Secondary | ICD-10-CM | POA: Insufficient documentation

## 2022-12-11 DIAGNOSIS — Z853 Personal history of malignant neoplasm of breast: Secondary | ICD-10-CM | POA: Insufficient documentation

## 2022-12-11 DIAGNOSIS — R569 Unspecified convulsions: Secondary | ICD-10-CM | POA: Diagnosis present

## 2022-12-11 DIAGNOSIS — Z95828 Presence of other vascular implants and grafts: Secondary | ICD-10-CM

## 2022-12-11 LAB — CBC WITH DIFFERENTIAL (CANCER CENTER ONLY)
Abs Immature Granulocytes: 0.04 10*3/uL (ref 0.00–0.07)
Basophils Absolute: 0 10*3/uL (ref 0.0–0.1)
Basophils Relative: 1 %
Eosinophils Absolute: 0.1 10*3/uL (ref 0.0–0.5)
Eosinophils Relative: 1 %
HCT: 35.5 % — ABNORMAL LOW (ref 36.0–46.0)
Hemoglobin: 12.1 g/dL (ref 12.0–15.0)
Immature Granulocytes: 1 %
Lymphocytes Relative: 25 %
Lymphs Abs: 1.1 10*3/uL (ref 0.7–4.0)
MCH: 30.7 pg (ref 26.0–34.0)
MCHC: 34.1 g/dL (ref 30.0–36.0)
MCV: 90.1 fL (ref 80.0–100.0)
Monocytes Absolute: 0.6 10*3/uL (ref 0.1–1.0)
Monocytes Relative: 14 %
Neutro Abs: 2.5 10*3/uL (ref 1.7–7.7)
Neutrophils Relative %: 58 %
Platelet Count: 204 10*3/uL (ref 150–400)
RBC: 3.94 MIL/uL (ref 3.87–5.11)
RDW: 14.7 % (ref 11.5–15.5)
WBC Count: 4.3 10*3/uL (ref 4.0–10.5)
nRBC: 0 % (ref 0.0–0.2)

## 2022-12-11 LAB — PHOSPHORUS: Phosphorus: 3.5 mg/dL (ref 2.5–4.6)

## 2022-12-11 LAB — COMPREHENSIVE METABOLIC PANEL
ALT: 10 U/L (ref 0–44)
AST: 20 U/L (ref 15–41)
Albumin: 3.4 g/dL — ABNORMAL LOW (ref 3.5–5.0)
Alkaline Phosphatase: 84 U/L (ref 38–126)
Anion gap: 8 (ref 5–15)
BUN: 10 mg/dL (ref 8–23)
CO2: 22 mmol/L (ref 22–32)
Calcium: 9.2 mg/dL (ref 8.9–10.3)
Chloride: 106 mmol/L (ref 98–111)
Creatinine, Ser: 0.85 mg/dL (ref 0.44–1.00)
GFR, Estimated: >60 mL/min (ref >60)
Glucose, Bld: 243 mg/dL — ABNORMAL HIGH (ref 70–99)
Potassium: 4.8 mmol/L (ref 3.5–5.1)
Sodium: 136 mmol/L (ref 135–145)
Total Bilirubin: 0.4 mg/dL (ref 0.3–1.2)
Total Protein: 6.5 g/dL (ref 6.5–8.1)

## 2022-12-11 LAB — MAGNESIUM
Magnesium: 1.8 mg/dL (ref 1.7–2.4)
Magnesium: 1.9 mg/dL (ref 1.7–2.4)

## 2022-12-11 LAB — URINALYSIS, ROUTINE W REFLEX MICROSCOPIC
Bacteria, UA: NONE SEEN
Bilirubin Urine: NEGATIVE
Glucose, UA: >=500 mg/dL — AB
Hgb urine dipstick: NEGATIVE
Ketones, ur: NEGATIVE mg/dL
Leukocytes,Ua: NEGATIVE
Nitrite: NEGATIVE
Protein, ur: NEGATIVE mg/dL
Specific Gravity, Urine: 1.006 (ref 1.005–1.030)
pH: 5 (ref 5.0–8.0)

## 2022-12-11 LAB — CMP (CANCER CENTER ONLY)
ALT: 7 U/L (ref 0–44)
AST: 12 U/L — ABNORMAL LOW (ref 15–41)
Albumin: 3.5 g/dL (ref 3.5–5.0)
Alkaline Phosphatase: 90 U/L (ref 38–126)
Anion gap: 7 (ref 5–15)
BUN: 11 mg/dL (ref 8–23)
CO2: 24 mmol/L (ref 22–32)
Calcium: 9.3 mg/dL (ref 8.9–10.3)
Chloride: 103 mmol/L (ref 98–111)
Creatinine: 0.77 mg/dL (ref 0.44–1.00)
GFR, Estimated: >60 mL/min (ref >60)
Glucose, Bld: 235 mg/dL — ABNORMAL HIGH (ref 70–99)
Potassium: 3.8 mmol/L (ref 3.5–5.1)
Sodium: 134 mmol/L — ABNORMAL LOW (ref 135–145)
Total Bilirubin: 0.4 mg/dL (ref 0.3–1.2)
Total Protein: 6.5 g/dL (ref 6.5–8.1)

## 2022-12-11 LAB — CBC WITH DIFFERENTIAL/PLATELET
Abs Immature Granulocytes: 0.04 10*3/uL (ref 0.00–0.07)
Basophils Absolute: 0 10*3/uL (ref 0.0–0.1)
Basophils Relative: 1 %
Eosinophils Absolute: 0 10*3/uL (ref 0.0–0.5)
Eosinophils Relative: 0 %
HCT: 40 % (ref 36.0–46.0)
Hemoglobin: 12.8 g/dL (ref 12.0–15.0)
Immature Granulocytes: 1 %
Lymphocytes Relative: 13 %
Lymphs Abs: 0.6 10*3/uL — ABNORMAL LOW (ref 0.7–4.0)
MCH: 29.8 pg (ref 26.0–34.0)
MCHC: 32 g/dL (ref 30.0–36.0)
MCV: 93 fL (ref 80.0–100.0)
Monocytes Absolute: 0.1 10*3/uL (ref 0.1–1.0)
Monocytes Relative: 3 %
Neutro Abs: 3.6 10*3/uL (ref 1.7–7.7)
Neutrophils Relative %: 82 %
Platelets: 221 10*3/uL (ref 150–400)
RBC: 4.3 MIL/uL (ref 3.87–5.11)
RDW: 14.8 % (ref 11.5–15.5)
WBC: 4.4 10*3/uL (ref 4.0–10.5)
nRBC: 0 % (ref 0.0–0.2)

## 2022-12-11 LAB — RESP PANEL BY RT-PCR (RSV, FLU A&B, COVID)  RVPGX2
Influenza A by PCR: NEGATIVE
Influenza B by PCR: NEGATIVE
Resp Syncytial Virus by PCR: NEGATIVE
SARS Coronavirus 2 by RT PCR: NEGATIVE

## 2022-12-11 LAB — RAPID URINE DRUG SCREEN, HOSP PERFORMED
Amphetamines: NOT DETECTED
Barbiturates: NOT DETECTED
Benzodiazepines: NOT DETECTED
Cocaine: NOT DETECTED
Opiates: NOT DETECTED
Tetrahydrocannabinol: NOT DETECTED

## 2022-12-11 LAB — ETHANOL: Alcohol, Ethyl (B): <10 mg/dL (ref <10)

## 2022-12-11 MED ORDER — PALONOSETRON HCL INJECTION 0.25 MG/5ML
0.2500 mg | Freq: Once | INTRAVENOUS | Status: AC
  Administered 2022-12-11: 0.25 mg via INTRAVENOUS
  Filled 2022-12-11: qty 5

## 2022-12-11 MED ORDER — PREDNISONE 50 MG PO TABS: ORAL_TABLET | ORAL | 0 refills | Status: DC

## 2022-12-11 MED ORDER — HEPARIN SOD (PORK) LOCK FLUSH 100 UNIT/ML IV SOLN
INTRAVENOUS | Status: AC
  Filled 2022-12-11: qty 5

## 2022-12-11 MED ORDER — SODIUM CHLORIDE 0.9 % IV SOLN
5.8000 mg/kg | Freq: Once | INTRAVENOUS | Status: AC
  Administered 2022-12-11: 360 mg via INTRAVENOUS
  Filled 2022-12-11: qty 36

## 2022-12-11 MED ORDER — SODIUM CHLORIDE 0.9% FLUSH: 10.0000 mL | INTRAVENOUS | Status: DC | PRN

## 2022-12-11 MED ORDER — SODIUM CHLORIDE 0.9% FLUSH
10.0000 mL | Freq: Once | INTRAVENOUS | Status: AC
  Administered 2022-12-11: 10 mL

## 2022-12-11 MED ORDER — ACETAMINOPHEN 325 MG PO TABS
650.0000 mg | ORAL_TABLET | Freq: Once | ORAL | Status: AC
  Administered 2022-12-11: 650 mg via ORAL
  Filled 2022-12-11: qty 2

## 2022-12-11 MED ORDER — CETIRIZINE HCL 10 MG/ML IV SOLN
10.0000 mg | Freq: Once | INTRAVENOUS | Status: AC
  Administered 2022-12-11: 10 mg via INTRAVENOUS
  Filled 2022-12-11: qty 1

## 2022-12-11 MED ORDER — FAMOTIDINE IN NACL 20-0.9 MG/50ML-% IV SOLN
20.0000 mg | Freq: Once | INTRAVENOUS | Status: AC
  Administered 2022-12-11: 20 mg via INTRAVENOUS
  Filled 2022-12-11: qty 50

## 2022-12-11 MED ORDER — SODIUM CHLORIDE 0.9 % IV SOLN: Freq: Once | INTRAVENOUS | Status: AC

## 2022-12-11 MED ORDER — SODIUM CHLORIDE 0.9 % IV SOLN
150.0000 mg | Freq: Once | INTRAVENOUS | Status: AC
  Administered 2022-12-11: 150 mg via INTRAVENOUS
  Filled 2022-12-11: qty 150

## 2022-12-11 MED ORDER — HEPARIN SOD (PORK) LOCK FLUSH 100 UNIT/ML IV SOLN: 500.0000 [IU] | Freq: Once | INTRAVENOUS | Status: DC | PRN

## 2022-12-11 MED ORDER — SODIUM CHLORIDE 0.9 % IV SOLN
10.0000 mg | Freq: Once | INTRAVENOUS | Status: AC
  Administered 2022-12-11: 10 mg via INTRAVENOUS
  Filled 2022-12-11: qty 10

## 2022-12-11 NOTE — ED Notes (Signed)
Patient transported to CT 

## 2022-12-11 NOTE — ED Triage Notes (Signed)
Patient brought over cancer center after having seizure like activity during treatment. LOC unknown, no meds given and patient has electronic AICD.

## 2022-12-11 NOTE — Progress Notes (Signed)
Called to infusion center for seizure like activity during chemo.  Briefly this is a 82 yo female with onc history of metastatic left breast cancer that was receiving targeted therapy with [name] when she started to have full body shaking per RN. Infusion was almost finished.This lasted approximately one minute and they are unsure if there was LOC. When I arrived patient is talking and appears to be at baseline. She is hypertensive 221/107 and complaining of chest pressure. No focal weakness appreciated, no incontinence, clear lung exam. CMP without significant electrolyte derangements, Na 134. Patient reports she is used to take medication for seizures although is not on any currently. She adds that she is allergic to Purell because it causes her throat and chest to swell.   Patient has [employer] AICD and with her complaint of chest pressure will need this interrogated in the ED. She is agreeable with plan. Patient taken to ED in stable condition.

## 2022-12-11 NOTE — Assessment & Plan Note (Signed)
03/22/2020:diffuse left breast swelling. skin thickening and two areas of irregular hypoechogenicity in the 2-3 o'clock region, 4.9cm, and at least 4 abnormal lymph nodes  with cortical thickening. 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  Neoadjuvant chemotherapy with CMF 2.  09/14/2020:Left mastectomy ([name]): invasive and in situ ductal carcinoma, grade 2, 15cm, clear margins, with 14/14 lymph nodes positive for metastatic carcinoma with extracapsular extension.  ER 30%, PR 10%, HER-2 negative, Ki-67 15% 3.  Adjuvant radiation 10/28/2020-12/17/2020 4.  Followed by adjuvant antiestrogen therapy with anastrozole started 01/11/2021 5.  12/07/2021: Liver metastases: Biopsy consistent with metastatic breast cancer ER 0%, PR 0%, HER2 1+ 6.  Capecitabine 1000 mg p.o. twice daily 2 weeks on 1 week off started 01/11/2022-10/31/2022.  Progression ----------------------------------------------------------------------------------------------------------------------- Current treatment: Trodelvy started 11/20/2022, today cycle 2-day 1 Toxicities: Severe fatigue Fogginess in the head: IV cefazolin dose was reduced Constipation  Return to clinic in 1 week for cycle 2-day 8 and I will see her with cycle 3.

## 2022-12-11 NOTE — Progress Notes (Signed)
Patient Care Team: [name], MD as PCP - General (Family Medicine) Kyung Rudd, MD as Consulting Physician (Radiation Oncology) Nicholas Lose, MD as Consulting Physician (Hematology and Oncology) Erroll Luna, MD as Consulting Physician (General Surgery)  DIAGNOSIS:  Encounter Diagnoses  Name Primary?   Malignant neoplasm of upper-outer quadrant of left breast in female, estrogen receptor positive ([place]) Yes   Metastases to the liver ([place])     SUMMARY OF ONCOLOGIC HISTORY: Oncology History  Malignant neoplasm of upper-outer quadrant of left breast in female, estrogen receptor positive ([place])  03/22/2020 Initial Diagnosis   Diffuse left breast swelling. skin thickening and two areas of irregular hypoechogenicity in the 2-3 o'clock region, 4.9cm, and at least 4 abnormal lymph nodes  with cortical thickening. 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    10/28/2020 - 12/17/2020 Radiation Therapy   The patient initially received a dose of 50.4 Gy in 28 fractions to the left breast and supraclavicular region using whole-breast tangent fields. This was delivered using a 3-D conformal technique. The pt received a boost delivering an additional 10 Gy in 5 fractions using a electron boost with 39mV electrons. The total dose was 60.4 Gy.   11/2020 - 11/2027 Anti-estrogen oral therapy   Anastrozole   01/30/2022 Miscellaneous   Guardant360:ERBB2 (exon 20 insertion): Benefit from Enhertu, MSI high not  detected   11/20/2022 Cancer Staging   Staging form: Breast, AJCC 8th Edition - Pathologic: Stage IV (pM1) - Signed by CGardenia Phlegm NP on 11/20/2022   11/20/2022 -  Chemotherapy   Patient is on Treatment Plan : BREAST METASTATIC Sacituzumab govitecan-hziy ([name] D1,8 q21d       CHIEF COMPLIANT: metastatic breast cancer  cycle 2-day 1 Trodelvy   INTERVAL HISTORY: Colleen BLUESTONEis a 82y.o. with above-mentioned history of metastatic breast cancer. She presents to the clinic today for a follow-up and treatment. She reports constipation and fatigue. She says she still have some fogginess in the head but she has increase her water intake. She is tolerating treatment extremely well.   ALLERGIES:  is allergic to alcohol, contrast media [iodinated contrast media], iodine, atorvastatin, dust mite extract, latex, lisinopril, and molds & smuts.  MEDICATIONS:  Current Outpatient Medications  Medication Sig Dispense Refill   alendronate (FOSAMAX) 70 MG tablet Take 1 tablet (70 mg total) by mouth once a week. Take with a full glass of water on an empty stomach. 12 tablet 3   Ascorbic Acid (VITAMIN C PO) Take 2,000 mg by mouth daily.     B Complex Vitamins (VITAMIN B COMPLEX) TABS Take 2 tablets by mouth daily.     B Complex-Folic Acid (B COMPLEX PLUS PO) Take 4 tablets by mouth daily.     Beta Carotene (VITAMIN A) 25000 UNIT capsule Take 50,000 Units by mouth daily.     CALCIUM PO Take 10 mLs by mouth daily. Liquid 300 MG /5 ML     Cholecalciferol (VITAMIN D3) 5000 units CAPS Take 5,000  Units by mouth daily. 2500 units per cap     Flaxseed, Linseed, (FLAXSEED OIL) 1000 MG CAPS Take 3,000 mg by mouth daily. Taking as needed     furosemide (LASIX) 20 MG tablet Take 20 mg by mouth daily.     Homeopathic Products (ARNICARE PAIN RELIEF EX) Apply 1 application  topically daily as needed (pain).     lidocaine-prilocaine (EMLA) cream Apply to affected area once 30 g 3   loperamide (IMODIUM) 2 MG  capsule Take 2 tabs by mouth with first loose stool, then 1 tab with each additional loose stool as needed. Do not exceed 8 tabs in a 24-hour period 30 capsule 0   losartan (COZAAR) 50 MG tablet Take 50 mg by mouth daily.     MAGNESIUM PO Take 5 mLs by mouth daily. Liquid 2.5 ml/150 mg     Misc Natural Products (BLOOD SUGAR BALANCE PO) Take 3 capsules by mouth daily. Blood Sugar Manager     OVER THE COUNTER MEDICATION Take 1 Scoop by mouth daily. Branched chain amino acids 4000  Patient states "taking for muscles"     OVER THE COUNTER MEDICATION Take 400-800 mg by mouth daily as needed (Based on Blood glucose). gymnema sylvestre Patient takes for "lowering blood glucose"     OVER THE COUNTER MEDICATION Take 5 capsules by mouth daily. Pau d' arco 500 mg each  Patient states now taking liquid and "keeps mold down"     OVER THE COUNTER MEDICATION Take 6 capsules by mouth daily. Caprylic acid Patient taking to "lower candida risk"     OVER THE COUNTER MEDICATION Take 5 capsules by mouth daily. Pancreatic enzymes Patient taking "to get fluid out"     OVER THE COUNTER MEDICATION Take 3 capsules by mouth daily. Raw Adrenal Patient taking for "overall energy"     OVER THE COUNTER MEDICATION Take 6 capsules by mouth daily. Beef Pancreas Patient taking for "pancreatic function improvement and lymphatic system improvement"     PANTETHINE PO Take 1-2 tablets by mouth daily as needed (Itching). Vitamin B5     potassium chloride SA (K-DUR,KLOR-CON) 20 MEQ tablet Take 20 mEq by mouth 2 (two) times daily.     predniSONE (DELTASONE) 50 MG tablet Take 1 tab 13, 7, and 1 hour prior to CT scan.  Take benadryl '25mg'$  oral 1 hour prior to CT scan 3 tablet 0   PROLENSA 0.07 % SOLN Place 1 drop into the right eye 4 (four) times daily.     Propylene Glycol (SYSTANE BALANCE) 0.6 % SOLN Place 2 drops into the right eye in the morning and at bedtime.     TRUE METRIX BLOOD GLUCOSE TEST test strip SMARTSIG:Via Meter      TRUEplus Lancets 33G MISC      ondansetron (ZOFRAN) 8 MG tablet Take 1 tablet (8 mg total) by mouth every 8 (eight) hours as needed for nausea or vomiting. Take 1 tablets ('8mg'$  total) by mouth every 8hrs as needed. Start on the third day after chemotherapy. (Patient not taking: Reported on 11/15/2022) 30 tablet 1   prochlorperazine (COMPAZINE) 10 MG tablet Take 1 tablet (10 mg total) by mouth every 6 (six) hours as needed for nausea or vomiting. (Patient not taking: Reported on 11/20/2022) 30 tablet 1   No current facility-administered medications for this visit.    PHYSICAL EXAMINATION: ECOG PERFORMANCE STATUS: 1 - Symptomatic but completely ambulatory  Vitals:   12/11/22 1141  BP: (!) 201/91  Pulse: 86  Resp: 15  Temp: 98.1 F (36.7 C)  SpO2: 100%   Filed Weights   12/11/22 1141  Weight: 138 lb (62.6 kg)      LABORATORY DATA:  I have reviewed the data as listed    Latest Ref Rng & Units 12/11/2022   11:08 AM 11/27/2022    9:52 AM 11/20/2022    9:55 AM  CMP  Glucose 70 - 99 mg/dL 235  245  295   BUN 8 - 23 mg/dL '11  14  13   '$ Creatinine 0.44 - 1.00 mg/dL 0.77  0.77  0.81   Sodium 135 - 145 mmol/L 134  136  133   Potassium 3.5 - 5.1 mmol/L 3.8  4.0  4.6   Chloride 98 - 111 mmol/L 103  102  103   CO2 22 - 32 mmol/L '24  27  24   '$ Calcium 8.9 - 10.3 mg/dL 9.3  9.7  9.5   Total Protein 6.5 - 8.1 g/dL 6.5  6.6  6.3   Total Bilirubin 0.3 - 1.2 mg/dL 0.4  0.4  0.5   Alkaline Phos 38 - 126 U/L 90  88  95   AST 15 - 41 U/L '12  12  21   '$ ALT 0 - 44 U/L '7  9  8     '$ Lab Results  Component Value Date   WBC 4.3 12/11/2022   HGB 12.1 12/11/2022   HCT 35.5 (L) 12/11/2022   MCV 90.1 12/11/2022   PLT 204 12/11/2022   NEUTROABS 2.5 12/11/2022    ASSESSMENT & PLAN:  Malignant neoplasm of upper-outer quadrant of left breast in female, estrogen receptor positive ([place]) 03/22/2020:diffuse left breast swelling. skin thickening and two areas of irregular hypoechogenicity in the 2-3 o'clock  region, 4.9cm, and at least 4 abnormal lymph nodes  with cortical thickening. 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  Neoadjuvant chemotherapy with CMF 2.  09/14/2020:Left mastectomy ([name]): invasive and in situ ductal carcinoma, grade 2, 15cm, clear margins, with 14/14 lymph nodes positive for metastatic carcinoma with extracapsular extension.  ER 30%, PR 10%, HER-2 negative, Ki-67 15% 3.  Adjuvant radiation 10/28/2020-12/17/2020 4.  Followed by adjuvant antiestrogen therapy with anastrozole started 01/11/2021 5.  12/07/2021: Liver metastases: Biopsy consistent with metastatic breast cancer ER 0%, PR 0%, HER2 1+ 6.  Capecitabine 1000 mg p.o. twice daily 2 weeks on 1 week off started 01/11/2022-10/31/2022.  Progression ----------------------------------------------------------------------------------------------------------------------- Current treatment: Trodelvy started 11/20/2022, today cycle 2-day 1 Toxicities: Severe fatigue Fogginess in the head: IV cefazolin dose was reduced Constipation  Return to clinic in 1 week for cycle 2-day 8 and I will see her with cycle 3.     Orders Placed This Encounter  Procedures   CT CHEST ABDOMEN PELVIS W CONTRAST    Standing Status:   Future    Standing Expiration Date:   12/12/2023    Order Specific Question:   If indicated for the ordered procedure, I authorize the administration of contrast media per Radiology protocol    Answer:   Yes    Order Specific Question:   Does the patient have a contrast media/X-ray dye allergy?    Answer:   Yes    Comments:   Prednisone and benadryl pre meds    Order Specific Question:   Preferred  imaging location?    Answer:   [facility]    Order Specific Question:   Release to patient    Answer:   Immediate    Order  Specific Question:   Is Oral Contrast requested for this exam?    Answer:   Yes, Per Radiology protocol   The patient has a good understanding of the overall plan. she agrees with it. she will call with any problems that may develop before the next visit here. Total time spent: 30 mins including face to face time and time spent for planning, charting and co-ordination of care   Harriette Ohara, MD 12/11/22    I Gardiner Coins am acting as a Education administrator for [employer]  I have reviewed the above documentation for accuracy and completeness, and I agree with the above.

## 2022-12-11 NOTE — ED Provider Notes (Signed)
[place] EMERGENCY DEPARTMENT AT [facility] Provider Note   CSN: [id number] Arrival date & time: 12/11/22  1640     History {Add pertinent medical, surgical, social history, OB history to HPI:1} Chief Complaint  Patient presents with   Seizures    Patient has seizure like activity while receiving chemo treatment     Colleen Lowery is a 82 y.o. female.  82 year old female with a history of breast cancer with liver metastases, nonischemic cardiomyopathy status post ICD, DM, and self-reported seizure-like activity who presents to the emergency department with seizure-like activity.  Patient reports that she was getting infusion today at her chemotherapy center when she started experiencing twitching of her left lower extremity that progressed to shaking.  Says that this has been happening for several years.  Believes that she has seen a neurologist but is not on any antiepileptics at this time.  Says that she retained consciousness during this episode and it lasted 1 to 2 minutes.  Says that she typically can suppress it when she holds her leg with her hands.  It is most commonly brought on by odors such as PRL hand sanitizer which she thinks was the cause today.  Denies any headaches.  No fevers.  Has had a cough over the past week productive of green sputum.  Denied any chest pain or shortness of breath during the episode.  No loss of consciousness during the episode.       Home Medications Prior to Admission medications   Medication Sig Start Date End Date Taking? Authorizing Provider  alendronate (FOSAMAX) 70 MG tablet Take 1 tablet (70 mg total) by mouth once a week. Take with a full glass of water on an empty stomach. 05/24/22   Nicholas Lose, MD  Ascorbic Acid (VITAMIN C PO) Take 2,000 mg by mouth daily.    [provider]  B Complex Vitamins (VITAMIN B COMPLEX) TABS Take 2 tablets by mouth daily.    [provider]  B Complex-Folic Acid (B COMPLEX PLUS  PO) Take 4 tablets by mouth daily.    [provider]  Beta Carotene (VITAMIN A) 25000 UNIT capsule Take 50,000 Units by mouth daily.    [provider]  CALCIUM PO Take 10 mLs by mouth daily. Liquid 300 MG /5 ML    [provider]  Cholecalciferol (VITAMIN D3) 5000 units CAPS Take 5,000 Units by mouth daily. 2500 units per cap    [provider]  Flaxseed, Linseed, (FLAXSEED OIL) 1000 MG CAPS Take 3,000 mg by mouth daily. Taking as needed    [provider]  furosemide (LASIX) 20 MG tablet Take 20 mg by mouth daily.    [provider]  Homeopathic Products (ARNICARE PAIN RELIEF EX) Apply 1 application  topically daily as needed (pain).    [provider]  lidocaine-prilocaine (EMLA) cream Apply to affected area once 10/31/22   Nicholas Lose, MD  loperamide (IMODIUM) 2 MG capsule Take 2 tabs by mouth with first loose stool, then 1 tab with each additional loose stool as needed. Do not exceed 8 tabs in a 24-hour period 10/31/22   Nicholas Lose, MD  losartan (COZAAR) 50 MG tablet Take 50 mg by mouth daily. 12/16/19   [provider]  MAGNESIUM PO Take 5 mLs by mouth daily. Liquid 2.5 ml/150 mg    [provider]  Misc Natural Products (BLOOD SUGAR BALANCE PO) Take 3 capsules by mouth daily. Blood Sugar Manager  [provider]  ondansetron (ZOFRAN) 8 MG tablet Take 1 tablet (8 mg total) by mouth every 8 (eight) hours as needed for nausea or vomiting. Take 1 tablets ('8mg'$  total) by mouth every 8hrs as needed. Start on the third day after chemotherapy. Patient not taking: Reported on 11/15/2022 10/31/22   Nicholas Lose, MD  OVER THE COUNTER MEDICATION Take 1 Scoop by mouth daily. Branched chain amino acids 4000  Patient states "taking for muscles"    [provider]  OVER THE COUNTER MEDICATION Take 400-800 mg by mouth daily as needed (Based on Blood glucose). gymnema sylvestre Patient takes for "lowering  blood glucose"    [provider]  OVER THE COUNTER MEDICATION Take 5 capsules by mouth daily. Pau d' arco 500 mg each  Patient states now taking liquid and "keeps mold down"    [provider]  OVER THE COUNTER MEDICATION Take 6 capsules by mouth daily. Caprylic acid Patient taking to "lower candida risk"    [provider]  OVER THE COUNTER MEDICATION Take 5 capsules by mouth daily. Pancreatic enzymes Patient taking "to get fluid out"    [provider]  OVER THE COUNTER MEDICATION Take 3 capsules by mouth daily. Raw Adrenal Patient taking for "overall energy"    [provider]  OVER THE COUNTER MEDICATION Take 6 capsules by mouth daily. Beef Pancreas Patient taking for "pancreatic function improvement and lymphatic system improvement"    [provider]  PANTETHINE PO Take 1-2 tablets by mouth daily as needed (Itching). Vitamin B5    [provider]  potassium chloride SA (K-DUR,KLOR-CON) 20 MEQ tablet Take 20 mEq by mouth 2 (two) times daily.    [provider]  predniSONE (DELTASONE) 50 MG tablet Take 1 tab 13, 7, and 1 hour prior to CT scan.  Take benadryl '25mg'$  oral 1 hour prior to CT scan 12/11/22   Nicholas Lose, MD  prochlorperazine (COMPAZINE) 10 MG tablet Take 1 tablet (10 mg total) by mouth every 6 (six) hours as needed for nausea or vomiting. Patient not taking: Reported on 11/20/2022 10/31/22   Nicholas Lose, MD  PROLENSA 0.07 % SOLN Place 1 drop into the right eye 4 (four) times daily. 08/15/22   [provider]  Propylene Glycol (SYSTANE BALANCE) 0.6 % SOLN Place 2 drops into the right eye in the morning and at bedtime.    [provider]  TRUE METRIX BLOOD GLUCOSE TEST test strip SMARTSIG:Via Meter 09/28/22   [provider]  TRUEplus Lancets 33G MISC  09/28/22   [provider]      Allergies    Alcohol, Contrast media [iodinated contrast media], Iodine, Atorvastatin,  Dust mite extract, Latex, Lisinopril, and Molds & smuts    Review of Systems   Review of Systems  Physical Exam Updated Vital Signs BP (!) 155/87   Pulse 72   Temp 98.1 F (36.7 C) (Oral) Comment: Simultaneous filing. User may not have seen previous data. Comment (Src): Simultaneous filing. User may not have seen previous data.  Resp 15   Ht '5\' 2"'$  (1.575 m)   Wt 63 kg   SpO2 100% Comment: Simultaneous filing. User may not have seen previous data.  BMI 25.40 kg/m  Physical Exam  ED Results / Procedures / Treatments   Labs (all labs ordered are listed, but only abnormal results are displayed) Labs Reviewed  COMPREHENSIVE METABOLIC PANEL - Abnormal; Notable for the following components:      Result  Value   Glucose, Bld 243 (*)    Albumin 3.4 (*)    All other components within normal limits  CBC WITH DIFFERENTIAL/PLATELET - Abnormal; Notable for the following components:   Lymphs Abs 0.6 (*)    All other components within normal limits  MAGNESIUM  ETHANOL  RAPID URINE DRUG SCREEN, HOSP PERFORMED  URINALYSIS, ROUTINE W REFLEX MICROSCOPIC  CBG MONITORING, ED    EKG EKG Interpretation  Date/Time:  Monday December 11 2022 16:51:06 EST Ventricular Rate:  77 PR Interval:  158 QRS Duration: 108 QT Interval:  395 QTC Calculation: 447 R Axis:   -2 Text Interpretation: Sinus rhythm Probable left ventricular hypertrophy Nonspecific T abnormalities, diffuse leads Confirmed by Regan Lemming ([id number]) on 12/11/2022 5:37:44 PM  Radiology No results found.  Procedures Procedures  {Document cardiac monitor, telemetry assessment procedure when appropriate:1}  Medications Ordered in ED Medications - No data to display  ED Course/ Medical Decision Making/ A&P   {   Click here for ABCD2, HEART and other calculatorsREFRESH Note before signing :1}                          Medical Decision Making Amount and/or Complexity of Data Reviewed Labs: ordered. Radiology:  ordered.   ***  {Document critical care time when appropriate:1} {Document review of labs and clinical decision tools ie heart score, Chads2Vasc2 etc:1}  {Document your independent review of radiology images, and any outside records:1} {Document your discussion with family members, caretakers, and with consultants:1} {Document social determinants of health affecting pt's care:1} {Document your decision making why or why not admission, treatments were needed:1} Final Clinical Impression(s) / ED Diagnoses Final diagnoses:  None    Rx / DC Orders ED Discharge Orders     None

## 2022-12-11 NOTE — Patient Instructions (Signed)
[facility]  Discharge Instructions: Thank you for choosing [facility] to provide your oncology and hematology care.   If you have a lab appointment with the [facility], please go directly to the [facility] and check in at the registration area.   Wear comfortable clothing and clothing appropriate for easy access to any Portacath or PICC line.   We strive to give you quality time with your provider. You may need to reschedule your appointment if you arrive late (15 or more minutes).  Arriving late affects you and other patients whose appointments are after yours.  Also, if you miss three or more appointments without notifying the office, you may be dismissed from the clinic at the provider's discretion.      For prescription refill requests, have your pharmacy contact our office and allow 72 hours for refills to be completed.    Today you received the following chemotherapy and/or immunotherapy agent: [name]   To help prevent nausea and vomiting after your treatment, we encourage you to take your nausea medication as directed.  BELOW ARE SYMPTOMS THAT SHOULD BE REPORTED IMMEDIATELY: *FEVER GREATER THAN 100.4 F (38 C) OR HIGHER *CHILLS OR SWEATING *NAUSEA AND VOMITING THAT IS NOT CONTROLLED WITH YOUR NAUSEA MEDICATION *UNUSUAL SHORTNESS OF BREATH *UNUSUAL BRUISING OR BLEEDING *URINARY PROBLEMS (pain or burning when urinating, or frequent urination) *BOWEL PROBLEMS (unusual diarrhea, constipation, pain near the anus) TENDERNESS IN MOUTH AND THROAT WITH OR WITHOUT PRESENCE OF ULCERS (sore throat, sores in mouth, or a toothache) UNUSUAL RASH, SWELLING OR PAIN  UNUSUAL VAGINAL DISCHARGE OR ITCHING   Items with * indicate a potential emergency and should be followed up as soon as possible or go to the Emergency Department if any problems should occur.  Please show the CHEMOTHERAPY ALERT CARD or IMMUNOTHERAPY ALERT CARD at check-in  to the Emergency Department and triage nurse.  Should you have questions after your visit or need to cancel or reschedule your appointment, please contact [facility]  Dept: [phone]  and follow the prompts.  Office hours are 8:00 a.m. to 4:30 p.m. Monday - Friday. Please note that voicemails left after 4:00 p.m. may not be returned until the following business day.  We are closed weekends and major holidays. You have access to a nurse at all times for urgent questions. Please call the main number to the clinic Dept: [phone] and follow the prompts.   For any non-urgent questions, you may also contact your provider using MyChart. We now offer e-Visits for anyone 25 and older to request care online for non-urgent symptoms. For details visit [email or web address].   Also download the MyChart app! Go to the app store, search "MyChart", open the app, select [facility], and log in with your MyChart username and password.  Sacituzumab Govitecan Injection What is this medication? SACITUZUMAB GOVITECAN (SAK i TOOZ ue mab GOE vi TEE kan) treats breast cancer. It may also be used to treat bladder cancer and kidney cancer. It works by blocking a protein that causes cancer cells to grow and multiply. This helps to slow or stop the spread of cancer cells. This medicine may be used for other purposes; ask your health care provider or pharmacist if you have questions. COMMON BRAND NAME(S): TRODELVY What should I tell my care team before I take this medication? They need to know if you have any of these conditions: Carry the UGT1A1*28 gene Infection  Liver disease An unusual or allergic reaction to sacituzumab govitecan, other medications, foods, dyes, or preservatives Pregnant or trying to get pregnant Breast-feeding How should I use this medication? This medication is injected into a vein. It is given by your care team in a hospital or clinic setting. Talk to  your care team about the use of this medication in children. Special care may be needed. Overdosage: If you think you have taken too much of this medicine contact a poison control center or emergency room at once. NOTE: This medicine is only for you. Do not share this medicine with others. What if I miss a dose? Keep appointments for follow-up doses. It is important not to miss your dose. Call your care team if you are unable to keep an appointment. What may interact with this medication? This medication may affect how other medications work, and other medications may affect the way this medication works. Talk with your care team about all of the medications you take. They may suggest changes to your treatment plan to lower the risk of side effects and to make sure your medications work as intended. This list may not describe all possible interactions. Give your health care provider a list of all the medicines, herbs, non-prescription drugs, or dietary supplements you use. Also tell them if you smoke, drink alcohol, or use illegal drugs. Some items may interact with your medicine. What should I watch for while using this medication? This medication may make you feel generally unwell. This is not uncommon as chemotherapy can affect healthy cells as well as cancer cells. Report any side effects. Continue your course of treatment even though you feel ill unless your care team tells you to stop. You may need blood work while you are taking this medication. Certain genetic factors may decrease the safety of this medication. Your care team may use genetic tests to determine treatment. This medication can cause serious allergic reactions. To reduce your risk, your care team may give you other medications to take before receiving this one. Be sure to follow the directions from your care team. Check with your care team if you have severe diarrhea, nausea, and vomiting, or if you sweat a lot. The loss of too much  body fluid may make it dangerous for you to take this medication. Talk to your care team if you wish to become pregnant or think you might be pregnant. This medication can cause serious birth defects if taken during pregnancy or if you get pregnant within 6 months after stopping treatment. A negative pregnancy test is required before starting this medication. A reliable form of contraception is recommended while taking this medication and for 6 months after stopping treatment. Talk to your care team about reliable forms of contraception. Use a condom during sex and for 3 months after stopping treatment. Tell your care team right away if you think your partner might be pregnant. This medication can cause serious birth defects. Do not breast-feed while taking this medication and for 1 month after stopping therapy. This medication may cause infertility. Talk to your care team if you are concerned about your fertility. This medication may increase your risk of getting an infection. Call your care team for advice if you get a fever, chills, sore throat, or other symptoms of a cold or flu. Do not treat yourself. Try to avoid being around people who are sick. Avoid taking medications that contain aspirin, acetaminophen, ibuprofen, naproxen, or ketoprofen unless instructed by your care  team. These medications may hide a fever. This medication may increase blood sugar. The risk may be higher in patients who already have diabetes. Ask your care team what you can do to lower your risk of diabetes while taking this medication. What side effects may I notice from receiving this medication? Side effects that you should report to your care team as soon as possible: Allergic reactions--skin rash, itching, hives, swelling of the face, lips, tongue, or throat Infection--fever, chills, cough, or sore throat Infusion reactions--chest pain, shortness of breath or trouble breathing, feeling faint or lightheaded Low red blood  cell level--unusual weakness or fatigue, dizziness, headache, trouble breathing Severe or prolonged diarrhea Side effects that usually do not require medical attention (report these to your care team if they continue or are bothersome): Constipation Diarrhea Fatigue Hair loss Loss of appetite Nausea Vomiting This list may not describe all possible side effects. Call your doctor for medical advice about side effects. You may report side effects to FDA at 1-800-FDA-1088. Where should I keep my medication? This medication is given in a hospital or clinic. It will not be stored at home. NOTE: This sheet is a summary. It may not cover all possible information. If you have questions about this medicine, talk to your doctor, pharmacist, or health care provider.  2023 Elsevier/Gold Standard (2021-12-29 00:00:00)

## 2022-12-11 NOTE — Progress Notes (Signed)
Patient had what appeared to be seizure activity with twitching and kicking of one leg. Patient was able to speak throughout the situation. It started from sleeping in her chair to the incident- during her [place] administration. Chemotherapy was stopped, patient was put on oxygen and Milinda Cave PA-C (symptom management) was called and VS were taken. Blood pressure was high 221/107, HR 114. Blood sugar was 206. Patient returned to her baseline cognition and visually she looked normal after about a minute. Anda Kraft discussed with Dr. Lindi Adie and patient to be taken to the hospital to assess her further. PAC was left accessed for convenience in the hospital. The remainder of thr trodelvy was not administered per Kate/MD. There was almost none left- maybe 35-20m including the tubing. [facility]

## 2022-12-11 NOTE — Discharge Instructions (Signed)
You were seen for your seizure-like activity in the emergency department.   At home, please continue to take your normal medications.  Please do not drive until you are able to talk to a neurologist about your seizure-like episodes.    Follow-up with your primary doctor in 2-3 days regarding your visit.  Follow-up with neurology as soon as possible.  Return immediately to the emergency department if you experience any of the following: Repeated seizures, confusion, chest pain, shortness of breath, or any other concerning symptoms.    Thank you for visiting our Emergency Department. It was a pleasure taking care of you today.

## 2022-12-12 LAB — CANCER ANTIGEN 27.29: CA 27.29: 83.5 U/mL — ABNORMAL HIGH (ref 0.0–38.6)

## 2022-12-13 ENCOUNTER — Telehealth: Payer: Self-pay

## 2022-12-13 NOTE — Telephone Encounter (Signed)
Pt called  to let us know of her seizure activity 12/11/22. She states seizure was because of fumes from purell hand sanitizer. She states she will bring "zero odor" spray next time she comes in.  Advised pt MD is aware of seizure-like activity.

## 2022-12-14 ENCOUNTER — Telehealth: Payer: Self-pay | Admitting: Hematology and Oncology

## 2022-12-14 NOTE — Telephone Encounter (Signed)
Scheduled appointment per WQ. Patient is aware of all made appointments.

## 2022-12-15 ENCOUNTER — Other Ambulatory Visit: Payer: Self-pay

## 2022-12-18 MED FILL — Dexamethasone Sodium Phosphate Inj 100 MG/10ML: INTRAMUSCULAR | Qty: 1 | Status: AC

## 2022-12-18 MED FILL — Fosaprepitant Dimeglumine For IV Infusion 150 MG (Base Eq): INTRAVENOUS | Qty: 5 | Status: AC

## 2022-12-19 ENCOUNTER — Inpatient Hospital Stay: Payer: Medicare HMO

## 2022-12-19 ENCOUNTER — Other Ambulatory Visit: Payer: Medicare HMO

## 2022-12-19 ENCOUNTER — Other Ambulatory Visit: Payer: Self-pay

## 2022-12-19 ENCOUNTER — Inpatient Hospital Stay: Payer: Medicare HMO | Attending: Hematology and Oncology

## 2022-12-19 ENCOUNTER — Emergency Department (HOSPITAL_COMMUNITY)
Admission: EM | Admit: 2022-12-19 | Discharge: 2022-12-19 | Disposition: A | Discharge status: Home / Self Care | Payer: Medicare HMO | Attending: Emergency Medicine | Admitting: Emergency Medicine

## 2022-12-19 ENCOUNTER — Ambulatory Visit: Payer: Medicare HMO | Admitting: Adult Health

## 2022-12-19 VITALS — BP 178/89 | HR 76 | Temp 97.8°F | Resp 18 | Wt 134.8 lb

## 2022-12-19 DIAGNOSIS — C50412 Malignant neoplasm of upper-outer quadrant of left female breast: Secondary | ICD-10-CM | POA: Insufficient documentation

## 2022-12-19 DIAGNOSIS — Z91040 Latex allergy status: Secondary | ICD-10-CM | POA: Diagnosis not present

## 2022-12-19 DIAGNOSIS — R569 Unspecified convulsions: Secondary | ICD-10-CM

## 2022-12-19 DIAGNOSIS — Z79899 Other long term (current) drug therapy: Secondary | ICD-10-CM | POA: Insufficient documentation

## 2022-12-19 DIAGNOSIS — Z8505 Personal history of malignant neoplasm of liver: Secondary | ICD-10-CM | POA: Diagnosis not present

## 2022-12-19 DIAGNOSIS — C787 Secondary malignant neoplasm of liver and intrahepatic bile duct: Secondary | ICD-10-CM | POA: Insufficient documentation

## 2022-12-19 DIAGNOSIS — C773 Secondary and unspecified malignant neoplasm of axilla and upper limb lymph nodes: Secondary | ICD-10-CM | POA: Insufficient documentation

## 2022-12-19 DIAGNOSIS — Z5112 Encounter for antineoplastic immunotherapy: Secondary | ICD-10-CM | POA: Insufficient documentation

## 2022-12-19 DIAGNOSIS — Z170 Estrogen receptor positive status [ER+]: Secondary | ICD-10-CM | POA: Insufficient documentation

## 2022-12-19 DIAGNOSIS — Z79811 Long term (current) use of aromatase inhibitors: Secondary | ICD-10-CM | POA: Insufficient documentation

## 2022-12-19 DIAGNOSIS — Z95828 Presence of other vascular implants and grafts: Secondary | ICD-10-CM

## 2022-12-19 DIAGNOSIS — Z853 Personal history of malignant neoplasm of breast: Secondary | ICD-10-CM | POA: Insufficient documentation

## 2022-12-19 LAB — COMPREHENSIVE METABOLIC PANEL
ALT: 11 U/L (ref 0–44)
AST: 20 U/L (ref 15–41)
Albumin: 3.4 g/dL — ABNORMAL LOW (ref 3.5–5.0)
Alkaline Phosphatase: 76 U/L (ref 38–126)
Anion gap: 8 (ref 5–15)
BUN: 12 mg/dL (ref 8–23)
CO2: 20 mmol/L — ABNORMAL LOW (ref 22–32)
Calcium: 8.4 mg/dL — ABNORMAL LOW (ref 8.9–10.3)
Chloride: 105 mmol/L (ref 98–111)
Creatinine, Ser: 0.73 mg/dL (ref 0.44–1.00)
GFR, Estimated: >60 mL/min (ref >60)
Glucose, Bld: 246 mg/dL — ABNORMAL HIGH (ref 70–99)
Potassium: 4.5 mmol/L (ref 3.5–5.1)
Sodium: 133 mmol/L — ABNORMAL LOW (ref 135–145)
Total Bilirubin: 0.5 mg/dL (ref 0.3–1.2)
Total Protein: 6.5 g/dL (ref 6.5–8.1)

## 2022-12-19 LAB — CBC WITH DIFFERENTIAL/PLATELET
Abs Immature Granulocytes: 0.1 10*3/uL — ABNORMAL HIGH (ref 0.00–0.07)
Basophils Absolute: 0 10*3/uL (ref 0.0–0.1)
Basophils Relative: 1 %
Eosinophils Absolute: 0 10*3/uL (ref 0.0–0.5)
Eosinophils Relative: 0 %
HCT: 37.7 % (ref 36.0–46.0)
Hemoglobin: 12.2 g/dL (ref 12.0–15.0)
Immature Granulocytes: 2 %
Lymphocytes Relative: 11 %
Lymphs Abs: 0.5 10*3/uL — ABNORMAL LOW (ref 0.7–4.0)
MCH: 29.8 pg (ref 26.0–34.0)
MCHC: 32.4 g/dL (ref 30.0–36.0)
MCV: 92.2 fL (ref 80.0–100.0)
Monocytes Absolute: 0.1 10*3/uL (ref 0.1–1.0)
Monocytes Relative: 2 %
Neutro Abs: 3.4 10*3/uL (ref 1.7–7.7)
Neutrophils Relative %: 84 %
Platelets: 184 10*3/uL (ref 150–400)
RBC: 4.09 MIL/uL (ref 3.87–5.11)
RDW: 15 % (ref 11.5–15.5)
WBC: 4.1 10*3/uL (ref 4.0–10.5)
nRBC: 0 % (ref 0.0–0.2)

## 2022-12-19 LAB — URINALYSIS, ROUTINE W REFLEX MICROSCOPIC
Bacteria, UA: NONE SEEN
Bilirubin Urine: NEGATIVE
Glucose, UA: 150 mg/dL — AB
Ketones, ur: NEGATIVE mg/dL
Leukocytes,Ua: NEGATIVE
Nitrite: NEGATIVE
Protein, ur: 30 mg/dL — AB
Specific Gravity, Urine: 1.005 (ref 1.005–1.030)
pH: 6 (ref 5.0–8.0)

## 2022-12-19 LAB — TROPONIN I (HIGH SENSITIVITY)
Troponin I (High Sensitivity): 13 ng/L (ref <18)
Troponin I (High Sensitivity): 21 ng/L — ABNORMAL HIGH (ref <18)

## 2022-12-19 LAB — CBC WITH DIFFERENTIAL (CANCER CENTER ONLY)
Abs Immature Granulocytes: 0.07 10*3/uL (ref 0.00–0.07)
Basophils Absolute: 0 10*3/uL (ref 0.0–0.1)
Basophils Relative: 1 %
Eosinophils Absolute: 0.1 10*3/uL (ref 0.0–0.5)
Eosinophils Relative: 1 %
HCT: 36.4 % (ref 36.0–46.0)
Hemoglobin: 12.3 g/dL (ref 12.0–15.0)
Immature Granulocytes: 2 %
Lymphocytes Relative: 21 %
Lymphs Abs: 1 10*3/uL (ref 0.7–4.0)
MCH: 30.5 pg (ref 26.0–34.0)
MCHC: 33.8 g/dL (ref 30.0–36.0)
MCV: 90.3 fL (ref 80.0–100.0)
Monocytes Absolute: 0.7 10*3/uL (ref 0.1–1.0)
Monocytes Relative: 16 %
Neutro Abs: 2.8 10*3/uL (ref 1.7–7.7)
Neutrophils Relative %: 59 %
Platelet Count: 183 10*3/uL (ref 150–400)
RBC: 4.03 MIL/uL (ref 3.87–5.11)
RDW: 15.1 % (ref 11.5–15.5)
WBC Count: 4.7 10*3/uL (ref 4.0–10.5)
nRBC: 0 % (ref 0.0–0.2)

## 2022-12-19 LAB — CMP (CANCER CENTER ONLY)
ALT: 8 U/L (ref 0–44)
AST: 12 U/L — ABNORMAL LOW (ref 15–41)
Albumin: 3.6 g/dL (ref 3.5–5.0)
Alkaline Phosphatase: 92 U/L (ref 38–126)
Anion gap: 7 (ref 5–15)
BUN: 12 mg/dL (ref 8–23)
CO2: 22 mmol/L (ref 22–32)
Calcium: 9 mg/dL (ref 8.9–10.3)
Chloride: 107 mmol/L (ref 98–111)
Creatinine: 0.75 mg/dL (ref 0.44–1.00)
GFR, Estimated: >60 mL/min (ref >60)
Glucose, Bld: 241 mg/dL — ABNORMAL HIGH (ref 70–99)
Potassium: 3.8 mmol/L (ref 3.5–5.1)
Sodium: 136 mmol/L (ref 135–145)
Total Bilirubin: 0.2 mg/dL — ABNORMAL LOW (ref 0.3–1.2)
Total Protein: 6.2 g/dL — ABNORMAL LOW (ref 6.5–8.1)

## 2022-12-19 LAB — MAGNESIUM
Magnesium: 2 mg/dL (ref 1.7–2.4)
Magnesium: 1.9 mg/dL (ref 1.7–2.4)

## 2022-12-19 LAB — PHOSPHORUS: Phosphorus: 2.5 mg/dL (ref 2.5–4.6)

## 2022-12-19 MED ORDER — ACETAMINOPHEN 325 MG PO TABS
650.0000 mg | ORAL_TABLET | Freq: Once | ORAL | Status: AC
  Administered 2022-12-19: 650 mg via ORAL
  Filled 2022-12-19: qty 2

## 2022-12-19 MED ORDER — SODIUM CHLORIDE 0.9% FLUSH
10.0000 mL | Freq: Once | INTRAVENOUS | Status: AC
  Administered 2022-12-19: 10 mL

## 2022-12-19 MED ORDER — HEPARIN SOD (PORK) LOCK FLUSH 100 UNIT/ML IV SOLN
500.0000 [IU] | Freq: Once | INTRAVENOUS | Status: AC | PRN
  Administered 2022-12-19: 500 [IU]

## 2022-12-19 MED ORDER — SODIUM CHLORIDE 0.9 % IV SOLN: Freq: Once | INTRAVENOUS | Status: AC

## 2022-12-19 MED ORDER — SODIUM CHLORIDE 0.9 % IV SOLN
10.0000 mg | Freq: Once | INTRAVENOUS | Status: AC
  Administered 2022-12-19: 10 mg via INTRAVENOUS
  Filled 2022-12-19: qty 10

## 2022-12-19 MED ORDER — SODIUM CHLORIDE 0.9% FLUSH
10.0000 mL | INTRAVENOUS | Status: DC | PRN
  Administered 2022-12-19: 10 mL

## 2022-12-19 MED ORDER — HEPARIN SOD (PORK) LOCK FLUSH 100 UNIT/ML IV SOLN
INTRAVENOUS | Status: AC
  Administered 2022-12-19: 100 [IU]
  Filled 2022-12-19: qty 5

## 2022-12-19 MED ORDER — PALONOSETRON HCL INJECTION 0.25 MG/5ML
0.2500 mg | Freq: Once | INTRAVENOUS | Status: AC
  Administered 2022-12-19: 0.25 mg via INTRAVENOUS
  Filled 2022-12-19: qty 5

## 2022-12-19 MED ORDER — LEVETIRACETAM IN NACL 1000 MG/100ML IV SOLN
1000.0000 mg | Freq: Once | INTRAVENOUS | Status: AC
  Administered 2022-12-19: 1000 mg via INTRAVENOUS
  Filled 2022-12-19: qty 100

## 2022-12-19 MED ORDER — LEVETIRACETAM 500 MG PO TABS: 500.0000 mg | ORAL_TABLET | Freq: Two times a day (BID) | ORAL | 0 refills | Status: DC

## 2022-12-19 MED ORDER — SODIUM CHLORIDE 0.9 % IV BOLUS: 1000.0000 mL | Freq: Once | INTRAVENOUS | Status: DC

## 2022-12-19 MED ORDER — SODIUM CHLORIDE 0.9 % IV SOLN
6.0000 mg/kg | Freq: Once | INTRAVENOUS | Status: AC
  Administered 2022-12-19: 360 mg via INTRAVENOUS
  Filled 2022-12-19: qty 36

## 2022-12-19 MED ORDER — CETIRIZINE HCL 10 MG/ML IV SOLN
10.0000 mg | Freq: Once | INTRAVENOUS | Status: AC
  Administered 2022-12-19: 10 mg via INTRAVENOUS
  Filled 2022-12-19: qty 1

## 2022-12-19 MED ORDER — SODIUM CHLORIDE 0.9 % IV SOLN
150.0000 mg | Freq: Once | INTRAVENOUS | Status: AC
  Administered 2022-12-19: 150 mg via INTRAVENOUS
  Filled 2022-12-19: qty 150

## 2022-12-19 MED ORDER — FAMOTIDINE IN NACL 20-0.9 MG/50ML-% IV SOLN
20.0000 mg | Freq: Once | INTRAVENOUS | Status: AC
  Administered 2022-12-19: 20 mg via INTRAVENOUS
  Filled 2022-12-19: qty 50

## 2022-12-19 NOTE — ED Triage Notes (Signed)
Pt arrives via EMS from home - had CA tx today, per friend driving, pt had a 2 to 3 minute seizure on the way home. Pt is still in post ictal state. 170/80 BP 231 cbg

## 2022-12-19 NOTE — Discharge Instructions (Addendum)
As we discussed, at this point I recommend Keppra 500 mg twice daily.  I have referred you to [facility] neurology for follow-up  Return to ER if you have another seizure, lethargy

## 2022-12-19 NOTE — Patient Instructions (Signed)
[facility]  Discharge Instructions: Thank you for choosing [facility] to provide your oncology and hematology care.   If you have a lab appointment with the [facility], please go directly to the [facility] and check in at the registration area.   Wear comfortable clothing and clothing appropriate for easy access to any Portacath or PICC line.   We strive to give you quality time with your provider. You may need to reschedule your appointment if you arrive late (15 or more minutes).  Arriving late affects you and other patients whose appointments are after yours.  Also, if you miss three or more appointments without notifying the office, you may be dismissed from the clinic at the provider's discretion.      For prescription refill requests, have your pharmacy contact our office and allow 72 hours for refills to be completed.    Today you received the following chemotherapy and/or immunotherapy agent: [name]   To help prevent nausea and vomiting after your treatment, we encourage you to take your nausea medication as directed.  BELOW ARE SYMPTOMS THAT SHOULD BE REPORTED IMMEDIATELY: *FEVER GREATER THAN 100.4 F (38 C) OR HIGHER *CHILLS OR SWEATING *NAUSEA AND VOMITING THAT IS NOT CONTROLLED WITH YOUR NAUSEA MEDICATION *UNUSUAL SHORTNESS OF BREATH *UNUSUAL BRUISING OR BLEEDING *URINARY PROBLEMS (pain or burning when urinating, or frequent urination) *BOWEL PROBLEMS (unusual diarrhea, constipation, pain near the anus) TENDERNESS IN MOUTH AND THROAT WITH OR WITHOUT PRESENCE OF ULCERS (sore throat, sores in mouth, or a toothache) UNUSUAL RASH, SWELLING OR PAIN  UNUSUAL VAGINAL DISCHARGE OR ITCHING   Items with * indicate a potential emergency and should be followed up as soon as possible or go to the Emergency Department if any problems should occur.  Please show the CHEMOTHERAPY ALERT CARD or IMMUNOTHERAPY ALERT CARD at check-in  to the Emergency Department and triage nurse.  Should you have questions after your visit or need to cancel or reschedule your appointment, please contact [facility]  Dept: [phone]  and follow the prompts.  Office hours are 8:00 a.m. to 4:30 p.m. Monday - Friday. Please note that voicemails left after 4:00 p.m. may not be returned until the following business day.  We are closed weekends and major holidays. You have access to a nurse at all times for urgent questions. Please call the main number to the clinic Dept: [phone] and follow the prompts.   For any non-urgent questions, you may also contact your provider using MyChart. We now offer e-Visits for anyone 57 and older to request care online for non-urgent symptoms. For details visit [email or web address].   Also download the MyChart app! Go to the app store, search "MyChart", open the app, select [facility], and log in with your MyChart username and password.  Sacituzumab Govitecan Injection What is this medication? SACITUZUMAB GOVITECAN (SAK i TOOZ ue mab GOE vi TEE kan) treats breast cancer. It may also be used to treat bladder cancer and kidney cancer. It works by blocking a protein that causes cancer cells to grow and multiply. This helps to slow or stop the spread of cancer cells. This medicine may be used for other purposes; ask your health care provider or pharmacist if you have questions. COMMON BRAND NAME(S): TRODELVY What should I tell my care team before I take this medication? They need to know if you have any of these conditions: Carry the UGT1A1*28 gene Infection  Liver disease An unusual or allergic reaction to sacituzumab govitecan, other medications, foods, dyes, or preservatives Pregnant or trying to get pregnant Breast-feeding How should I use this medication? This medication is injected into a vein. It is given by your care team in a hospital or clinic setting. Talk to  your care team about the use of this medication in children. Special care may be needed. Overdosage: If you think you have taken too much of this medicine contact a poison control center or emergency room at once. NOTE: This medicine is only for you. Do not share this medicine with others. What if I miss a dose? Keep appointments for follow-up doses. It is important not to miss your dose. Call your care team if you are unable to keep an appointment. What may interact with this medication? This medication may affect how other medications work, and other medications may affect the way this medication works. Talk with your care team about all of the medications you take. They may suggest changes to your treatment plan to lower the risk of side effects and to make sure your medications work as intended. This list may not describe all possible interactions. Give your health care provider a list of all the medicines, herbs, non-prescription drugs, or dietary supplements you use. Also tell them if you smoke, drink alcohol, or use illegal drugs. Some items may interact with your medicine. What should I watch for while using this medication? This medication may make you feel generally unwell. This is not uncommon as chemotherapy can affect healthy cells as well as cancer cells. Report any side effects. Continue your course of treatment even though you feel ill unless your care team tells you to stop. You may need blood work while you are taking this medication. Certain genetic factors may decrease the safety of this medication. Your care team may use genetic tests to determine treatment. This medication can cause serious allergic reactions. To reduce your risk, your care team may give you other medications to take before receiving this one. Be sure to follow the directions from your care team. Check with your care team if you have severe diarrhea, nausea, and vomiting, or if you sweat a lot. The loss of too much  body fluid may make it dangerous for you to take this medication. Talk to your care team if you wish to become pregnant or think you might be pregnant. This medication can cause serious birth defects if taken during pregnancy or if you get pregnant within 6 months after stopping treatment. A negative pregnancy test is required before starting this medication. A reliable form of contraception is recommended while taking this medication and for 6 months after stopping treatment. Talk to your care team about reliable forms of contraception. Use a condom during sex and for 3 months after stopping treatment. Tell your care team right away if you think your partner might be pregnant. This medication can cause serious birth defects. Do not breast-feed while taking this medication and for 1 month after stopping therapy. This medication may cause infertility. Talk to your care team if you are concerned about your fertility. This medication may increase your risk of getting an infection. Call your care team for advice if you get a fever, chills, sore throat, or other symptoms of a cold or flu. Do not treat yourself. Try to avoid being around people who are sick. Avoid taking medications that contain aspirin, acetaminophen, ibuprofen, naproxen, or ketoprofen unless instructed by your care  team. These medications may hide a fever. This medication may increase blood sugar. The risk may be higher in patients who already have diabetes. Ask your care team what you can do to lower your risk of diabetes while taking this medication. What side effects may I notice from receiving this medication? Side effects that you should report to your care team as soon as possible: Allergic reactions--skin rash, itching, hives, swelling of the face, lips, tongue, or throat Infection--fever, chills, cough, or sore throat Infusion reactions--chest pain, shortness of breath or trouble breathing, feeling faint or lightheaded Low red blood  cell level--unusual weakness or fatigue, dizziness, headache, trouble breathing Severe or prolonged diarrhea Side effects that usually do not require medical attention (report these to your care team if they continue or are bothersome): Constipation Diarrhea Fatigue Hair loss Loss of appetite Nausea Vomiting This list may not describe all possible side effects. Call your doctor for medical advice about side effects. You may report side effects to FDA at 1-800-FDA-1088. Where should I keep my medication? This medication is given in a hospital or clinic. It will not be stored at home. NOTE: This sheet is a summary. It may not cover all possible information. If you have questions about this medicine, talk to your doctor, pharmacist, or health care provider.  2023 Elsevier/Gold Standard (2021-12-29 00:00:00)

## 2022-12-19 NOTE — ED Provider Notes (Addendum)
[facility] Provider Note   CSN: [id number] Arrival date & time: 12/19/22  1811     History  Chief Complaint  Patient presents with   Seizures    Colleen Lowery is a 82 y.o. 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  Patient just had Sacituzumab Govitecan Injection today at the cancer center.  She was on the way home and the friend was driving.  She was noted to have about 2-minute tonic-clonic seizure activity.  She states that she woke up after she got here to the ER.  She came in about a week ago for similar symptoms.  Her CT head did not show any bleed or brain mass at that time.  Patient has no known brain mets.  Patient has no history of seizure.  The history is provided by the patient.       Home Medications Prior to Admission medications   Medication Sig Start Date End Date Taking? Authorizing Provider  alendronate (FOSAMAX) 70 MG tablet Take 1 tablet (70 mg total) by mouth once a week. Take with a full glass of water on an empty stomach. 05/24/22   Nicholas Lose, MD  Ascorbic Acid (VITAMIN C PO) Take 2,000 mg by mouth daily.    [provider]  B Complex Vitamins (VITAMIN B COMPLEX) TABS Take 2 tablets by mouth daily.    [provider]  B Complex-Folic Acid (B COMPLEX PLUS PO) Take 4 tablets by mouth daily.    [provider]  Beta Carotene (VITAMIN A) 25000 UNIT capsule Take 50,000 Units by mouth daily.    [provider]  CALCIUM PO Take 10 mLs by mouth daily. Liquid 300 MG /5 ML    [provider]  Cholecalciferol (VITAMIN D3) 5000 units CAPS Take 5,000 Units by mouth daily. 2500 units per cap    [provider]  Flaxseed, Linseed, (FLAXSEED OIL) 1000 MG CAPS Take 3,000 mg by mouth daily. Taking as needed    [provider]  furosemide (LASIX) 20 MG tablet Take 20 mg by mouth daily.    [provider]  Homeopathic Products (ARNICARE PAIN RELIEF EX) Apply 1 application  topically daily as needed (pain).    [provider]  lidocaine-prilocaine (EMLA) cream Apply to affected area once 10/31/22   Nicholas Lose, MD  loperamide (IMODIUM) 2 MG capsule Take 2 tabs by mouth with first loose stool, then 1 tab with each additional loose stool as needed. Do not exceed 8 tabs in a 24-hour period 10/31/22   Nicholas Lose, MD  losartan (COZAAR) 50 MG tablet Take 50 mg by mouth daily. 12/16/19   [provider]  MAGNESIUM PO Take 5 mLs by mouth daily. Liquid 2.5 ml/150 mg    [provider]  Misc Natural Products (BLOOD SUGAR BALANCE PO) Take 3 capsules by mouth daily. Blood Sugar Manager    [provider]  ondansetron (ZOFRAN) 8 MG tablet Take 1 tablet (8 mg total) by mouth every 8 (eight) hours as needed for nausea or vomiting. Take 1 tablets ('8mg'$  total) by mouth every 8hrs as needed. Start on the third day after chemotherapy. Patient not taking: Reported on 11/15/2022 10/31/22   Nicholas Lose, MD  OVER THE COUNTER MEDICATION Take 1 Scoop by mouth daily. Branched chain amino acids 4000  Patient states "taking for muscles"    [provider]  OVER THE  COUNTER MEDICATION Take 400-800 mg by mouth daily as needed (Based on Blood glucose). gymnema sylvestre Patient takes for "lowering blood glucose"    [provider]  OVER THE COUNTER MEDICATION Take 5 capsules by mouth daily. Pau d' arco 500 mg each  Patient states now taking liquid and "keeps mold down"    [provider]  OVER THE COUNTER MEDICATION Take 6 capsules by mouth daily. Caprylic acid Patient taking to "lower candida risk"    [provider]  OVER THE COUNTER MEDICATION Take 5 capsules by mouth daily. Pancreatic enzymes Patient taking "to get fluid out"    [provider]  OVER THE COUNTER MEDICATION Take 3 capsules by mouth daily. Raw Adrenal Patient  taking for "overall energy"    [provider]  OVER THE COUNTER MEDICATION Take 6 capsules by mouth daily. Beef Pancreas Patient taking for "pancreatic function improvement and lymphatic system improvement"    [provider]  PANTETHINE PO Take 1-2 tablets by mouth daily as needed (Itching). Vitamin B5    [provider]  potassium chloride SA (K-DUR,KLOR-CON) 20 MEQ tablet Take 20 mEq by mouth 2 (two) times daily.    [provider]  predniSONE (DELTASONE) 50 MG tablet Take 1 tab 13, 7, and 1 hour prior to CT scan.  Take benadryl '25mg'$  oral 1 hour prior to CT scan 12/11/22   Nicholas Lose, MD  prochlorperazine (COMPAZINE) 10 MG tablet Take 1 tablet (10 mg total) by mouth every 6 (six) hours as needed for nausea or vomiting. Patient not taking: Reported on 11/20/2022 10/31/22   Nicholas Lose, MD  PROLENSA 0.07 % SOLN Place 1 drop into the right eye 4 (four) times daily. 08/15/22   [provider]  Propylene Glycol (SYSTANE BALANCE) 0.6 % SOLN Place 2 drops into the right eye in the morning and at bedtime.    [provider]  TRUE METRIX BLOOD GLUCOSE TEST test strip SMARTSIG:Via Meter 09/28/22   [provider]  TRUEplus Lancets 33G MISC  09/28/22   [provider]      Allergies    Alcohol, Contrast media [iodinated contrast media], Iodine, Atorvastatin, Dust mite extract, Latex, Lisinopril, and Molds & smuts    Review of Systems   Review of Systems  Neurological:  Positive for seizures.  All other systems reviewed and are negative.   Physical Exam Updated Vital Signs BP (!) 174/103   Pulse 87   Temp 97.7 F (36.5 C)   Resp 20   SpO2 100%  Physical Exam Vitals and nursing note reviewed.  Constitutional:      Comments: Chronically ill and slightly sleepy  HENT:     Head: Normocephalic.     Nose: Nose normal.     Mouth/Throat:     Mouth: Mucous membranes are dry.  Eyes:     Extraocular Movements: Extraocular  movements intact.     Pupils: Pupils are equal, round, and reactive to light.  Cardiovascular:     Rate and Rhythm: Normal rate and regular rhythm.     Pulses: Normal pulses.     Heart sounds: Normal heart sounds.  Pulmonary:     Effort: Pulmonary effort is normal.     Breath sounds: Normal breath sounds.  Abdominal:     General: Abdomen is flat.     Palpations: Abdomen is soft.  Musculoskeletal:        General: Normal range of motion.     Cervical back: Normal  range of motion and neck supple.  Skin:    General: Skin is warm.     Capillary Refill: Capillary refill takes less than 2 seconds.  Neurological:     General: No focal deficit present.     Mental Status: She is oriented to person, place, and time.     Comments: No eye deviation and patient is ANO x 3.  Patient has normal strength and sensation bilateral arms and legs  Psychiatric:        Mood and Affect: Mood normal.     ED Results / Procedures / Treatments   Labs (all labs ordered are listed, but only abnormal results are displayed) Labs Reviewed  CBC WITH DIFFERENTIAL/PLATELET - Abnormal; Notable for the following components:      Result Value   Lymphs Abs 0.5 (*)    Abs Immature Granulocytes 0.10 (*)    All other components within normal limits  COMPREHENSIVE METABOLIC PANEL  MAGNESIUM  TROPONIN I (HIGH SENSITIVITY)    EKG None  Radiology No results found.  Procedures Procedures    Medications Ordered in ED Medications  sodium chloride 0.9 % bolus 1,000 mL (1,000 mLs Intravenous Patient Refused/Not Given 12/19/22 1920)  levETIRAcetam (KEPPRA) IVPB 1000 mg/100 mL premix (1,000 mg Intravenous New Bag/Given 12/19/22 1909)    ED Course/ Medical Decision Making/ A&P                             Medical Decision Making TANISHIA LEMASTER is a 82 y.o. female here presenting with seizure-like activity.  This is her second episode in a week.  She had a negative CT head a week ago.  Plan to get CBC and CMP and  magnesium level and likely will need to be started on Keppra.   9:52 PM Reviewed patient's labs and they were unremarkable.  Of note initial troponin was 13 and went up to 21 but the delta is only 8.  Patient has no chest pain.  Her pacemaker was interrogated and there was no events.  Patient was loaded with Keppra.  Patient states that she felt that it the purell that is triggering her seizures.  She was briefly on Keppra previously.  She requests referral to neurology to discuss antiepileptics.  Problems Addressed: Seizure [facility]): acute illness or injury  Amount and/or Complexity of Data Reviewed Labs: ordered. Decision-making details documented in ED Course. ECG/medicine tests: ordered and independent interpretation performed. Decision-making details documented in ED Course.  Risk Prescription drug management.    Final Clinical Impression(s) / ED Diagnoses Final diagnoses:  None    Rx / DC Orders ED Discharge Orders     None         Drenda Freeze, MD 12/19/22 2154    Drenda Freeze, MD 12/19/22 2155

## 2022-12-20 LAB — CANCER ANTIGEN 27.29: CA 27.29: 62.1 U/mL — ABNORMAL HIGH (ref 0.0–38.6)

## 2022-12-21 ENCOUNTER — Telehealth: Payer: Self-pay | Admitting: *Deleted

## 2022-12-21 NOTE — Telephone Encounter (Signed)
Received call from pt stating she is unsure if she would like to proceed with tx due to hx of seizure activity when hand sanitizer is used near her.  MD out of office, RN encouraged pt to discuss in further detail with pt during next visit. Pt verbalized understanding.

## 2022-12-27 ENCOUNTER — Telehealth: Payer: Self-pay

## 2022-12-27 ENCOUNTER — Other Ambulatory Visit: Payer: Self-pay

## 2022-12-27 DIAGNOSIS — Z170 Estrogen receptor positive status [ER+]: Secondary | ICD-10-CM

## 2022-12-27 MED ORDER — PREDNISONE 50 MG PO TABS: ORAL_TABLET | ORAL | 0 refills | Status: DC

## 2022-12-27 NOTE — Telephone Encounter (Signed)
Pt called to request refill on 13 hr prep for CT scan. Sent in per MD.

## 2022-12-27 NOTE — Telephone Encounter (Signed)
Pt called back and states her PCP prescribed Valium to take before coming for infusions in hopes this will help prevent seizure activity d/t alcohol rub.

## 2022-12-28 ENCOUNTER — Ambulatory Visit: Payer: Medicare HMO | Admitting: Pharmacist

## 2022-12-28 ENCOUNTER — Other Ambulatory Visit: Payer: Medicare HMO

## 2023-01-01 MED FILL — Dexamethasone Sodium Phosphate Inj 100 MG/10ML: INTRAMUSCULAR | Qty: 1 | Status: AC

## 2023-01-01 MED FILL — Fosaprepitant Dimeglumine For IV Infusion 150 MG (Base Eq): INTRAVENOUS | Qty: 5 | Status: AC

## 2023-01-02 ENCOUNTER — Inpatient Hospital Stay: Payer: Medicare HMO | Admitting: Dietician

## 2023-01-02 ENCOUNTER — Encounter: Payer: Self-pay | Admitting: Adult Health

## 2023-01-02 ENCOUNTER — Inpatient Hospital Stay: Payer: Medicare HMO

## 2023-01-02 ENCOUNTER — Inpatient Hospital Stay (HOSPITAL_BASED_OUTPATIENT_CLINIC_OR_DEPARTMENT_OTHER): Payer: Medicare HMO | Admitting: Adult Health

## 2023-01-02 ENCOUNTER — Other Ambulatory Visit: Payer: Self-pay

## 2023-01-02 VITALS — BP 159/95 | HR 93 | Temp 97.7°F | Resp 16 | Ht 62.0 in | Wt 135.8 lb

## 2023-01-02 DIAGNOSIS — C50412 Malignant neoplasm of upper-outer quadrant of left female breast: Secondary | ICD-10-CM | POA: Diagnosis present

## 2023-01-02 DIAGNOSIS — C773 Secondary and unspecified malignant neoplasm of axilla and upper limb lymph nodes: Secondary | ICD-10-CM | POA: Insufficient documentation

## 2023-01-02 DIAGNOSIS — Z170 Estrogen receptor positive status [ER+]: Secondary | ICD-10-CM

## 2023-01-02 DIAGNOSIS — Z79811 Long term (current) use of aromatase inhibitors: Secondary | ICD-10-CM | POA: Insufficient documentation

## 2023-01-02 DIAGNOSIS — Z95828 Presence of other vascular implants and grafts: Secondary | ICD-10-CM | POA: Diagnosis not present

## 2023-01-02 DIAGNOSIS — C787 Secondary malignant neoplasm of liver and intrahepatic bile duct: Secondary | ICD-10-CM | POA: Insufficient documentation

## 2023-01-02 DIAGNOSIS — Z79899 Other long term (current) drug therapy: Secondary | ICD-10-CM | POA: Insufficient documentation

## 2023-01-02 DIAGNOSIS — Z5112 Encounter for antineoplastic immunotherapy: Secondary | ICD-10-CM | POA: Diagnosis present

## 2023-01-02 LAB — CBC WITH DIFFERENTIAL (CANCER CENTER ONLY)
Abs Immature Granulocytes: 0.03 10*3/uL (ref 0.00–0.07)
Basophils Absolute: 0 10*3/uL (ref 0.0–0.1)
Basophils Relative: 1 %
Eosinophils Absolute: 0 10*3/uL (ref 0.0–0.5)
Eosinophils Relative: 1 %
HCT: 36.2 % (ref 36.0–46.0)
Hemoglobin: 12.3 g/dL (ref 12.0–15.0)
Immature Granulocytes: 1 %
Lymphocytes Relative: 16 %
Lymphs Abs: 1 10*3/uL (ref 0.7–4.0)
MCH: 30.3 pg (ref 26.0–34.0)
MCHC: 34 g/dL (ref 30.0–36.0)
MCV: 89.2 fL (ref 80.0–100.0)
Monocytes Absolute: 0.6 10*3/uL (ref 0.1–1.0)
Monocytes Relative: 9 %
Neutro Abs: 4.7 10*3/uL (ref 1.7–7.7)
Neutrophils Relative %: 72 %
Platelet Count: 231 10*3/uL (ref 150–400)
RBC: 4.06 MIL/uL (ref 3.87–5.11)
RDW: 15.2 % (ref 11.5–15.5)
WBC Count: 6.4 10*3/uL (ref 4.0–10.5)
nRBC: 0 % (ref 0.0–0.2)

## 2023-01-02 LAB — CMP (CANCER CENTER ONLY)
ALT: 7 U/L (ref 0–44)
AST: 12 U/L — ABNORMAL LOW (ref 15–41)
Albumin: 3.8 g/dL (ref 3.5–5.0)
Alkaline Phosphatase: 92 U/L (ref 38–126)
Anion gap: 7 (ref 5–15)
BUN: 13 mg/dL (ref 8–23)
CO2: 24 mmol/L (ref 22–32)
Calcium: 9.5 mg/dL (ref 8.9–10.3)
Chloride: 106 mmol/L (ref 98–111)
Creatinine: 0.74 mg/dL (ref 0.44–1.00)
GFR, Estimated: >60 mL/min (ref >60)
Glucose, Bld: 190 mg/dL — ABNORMAL HIGH (ref 70–99)
Potassium: 4.1 mmol/L (ref 3.5–5.1)
Sodium: 137 mmol/L (ref 135–145)
Total Bilirubin: 0.4 mg/dL (ref 0.3–1.2)
Total Protein: 6.5 g/dL (ref 6.5–8.1)

## 2023-01-02 LAB — PHOSPHORUS: Phosphorus: 5.1 mg/dL — ABNORMAL HIGH (ref 2.5–4.6)

## 2023-01-02 LAB — MAGNESIUM: Magnesium: 1.7 mg/dL (ref 1.7–2.4)

## 2023-01-02 MED ORDER — HEPARIN SOD (PORK) LOCK FLUSH 100 UNIT/ML IV SOLN
500.0000 [IU] | Freq: Once | INTRAVENOUS | Status: AC
  Administered 2023-01-02: 500 [IU]

## 2023-01-02 MED ORDER — SODIUM CHLORIDE 0.9% FLUSH
10.0000 mL | Freq: Once | INTRAVENOUS | Status: AC
  Administered 2023-01-02: 10 mL

## 2023-01-02 NOTE — Assessment & Plan Note (Signed)
Colleen Lowery is here for follow-up of her metastatic breast cancer prior to receiving treatment with [name].  She is due for cycle 3 of therapy however with both cycle 2-day 1 and day 8 she had seizure-like activity and was in the emergency room after both of those treatments.  She has been prescribed Keppra and has been taking this however after thorough discussion we have opted to postpone cycle 3-day 1 for another week to allow the [place] to get in her system more.  She will also have the Valium which she can take prior to her coming to the cancer center.  Also placed a referral to palliative care as she has considered stopping therapy if this activity continues.  We will see her back in 1 week for labs, follow-up, and her next visit.

## 2023-01-02 NOTE — Progress Notes (Signed)
[facility] Follow up:    [name], MD [place] [id number]   DIAGNOSIS:  Cancer Staging  Malignant neoplasm of upper-outer quadrant of left breast in female, estrogen receptor positive ([place]) Staging form: Breast, AJCC 8th Edition - Clinical stage from 04/07/2020: Stage IIIB (cT4b, cN1, cM0, G2, ER+, PR+, HER2-) - Signed by Nicholas Lose, MD on 04/07/2020 Stage prefix: Initial diagnosis Histologic grading system: 3 grade system - Pathologic stage from 09/14/2020: No Stage Recommended (ypT3, pN3a, cM0, G2, ER+, PR+, HER2-) - Unsigned Stage prefix: Post-therapy Histologic grading system: 3 grade system - Pathologic: Stage IV (pM1) - Signed by Gardenia Phlegm, NP on 11/20/2022   SUMMARY OF ONCOLOGIC HISTORY: Oncology History  Malignant neoplasm of upper-outer quadrant of left breast in female, estrogen receptor positive ([place])  03/22/2020 Initial Diagnosis   Diffuse left breast swelling. skin thickening and two areas of irregular hypoechogenicity in the 2-3 o'clock region, 4.9cm, and at least 4 abnormal lymph nodes  with cortical thickening. 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    10/28/2020 - 12/17/2020 Radiation Therapy   The patient initially received a dose of 50.4 Gy in 28 fractions to the left breast and supraclavicular region using whole-breast tangent fields. This was delivered using a 3-D conformal technique. The pt received a boost  delivering an additional 10 Gy in 5 fractions using a electron boost with 52mV electrons. The total dose was 60.4 Gy.   11/2020 - 11/2027 Anti-estrogen oral therapy   Anastrozole   01/30/2022 Miscellaneous   Guardant360:ERBB2 (exon 20 insertion): Benefit from Enhertu, MSI high not detected   11/20/2022 Cancer Staging   Staging form: Breast, AJCC 8th Edition - Pathologic: Stage IV (pM1) - Signed by CGardenia Phlegm NP on 11/20/2022   11/20/2022 -  Chemotherapy   Patient is on Treatment Plan : BREAST METASTATIC Sacituzumab govitecan-hziy ([name] D1,8 q21d       CURRENT THERAPY: TIvette Loyal INTERVAL HISTORY: LTEAUNA PRIDGEON82y.o. female returns for follow-up and evaluation prior to receiving cycle 3 of day 1 of [place]  She is receiving this treatment given on days 1 and 8 of a 21-day cycle.    LDamiyahwas seen in the emergency department on both December 11, 2022 and December 19, 2022 for seizure-like activity.  Imaging on January 29 demonstrated no brain metastases however she did undergo a Keppra load given in December 19, 2022 with neurology referral to discuss antiepileptics.  LDijanatells me that she also had follow-up with her primary care provider and was prescribed a medication for anxiety in addition to Valium.  She has not yet received this medication. She tells me that she is tolerating the Keppra well and has been on it for about a week.  She is scheduled [facility] CT [facility]  Patient Active Problem List   Diagnosis Date Noted   Metastases to the liver ([facility] 11/20/2022  Port-A-Cath in place 05/31/2020   Malignant neoplasm of upper-outer quadrant of left breast in female, estrogen receptor positive ([place]) 04/07/2020   Peripheral arterial disease ([place]) 06/14/2016   Upper airway cough syndrome 06/08/2016   CAD in native artery 05/31/2016   Chronic combined systolic and diastolic congestive heart failure ([place])  05/31/2016   VT (ventricular tachycardia) ([place]) 02/09/2016   Acute on chronic combined systolic and diastolic HF (heart failure) ([place]) 02/09/2016   Contrast media allergy 02/09/2016   Dyspnea 02/07/2016   Benign essential HTN 02/07/2016   Cigarette smoker 02/07/2016   Diabetes ([place]) 08/02/2014   Essential hypertension 08/02/2014    is allergic to alcohol, contrast media [iodinated contrast media], iodine, atorvastatin, dust mite extract, latex, lisinopril, and molds & smuts.  MEDICAL HISTORY: Past Medical History:  Diagnosis Date   AICD (automatic cardioverter/defibrillator) present    Allergy    Arthritis    FINGERS   Cancer ([place])    left breast   Cataract    right   Crohn disease ([place])    Depression    Diabetes mellitus without complication ([place])    TYPE 2 , TAKING OTC MED FORGYMEMA   GERD (gastroesophageal reflux disease)    Headache    sinus   Heart failure, acute, systolic and diastolic ([place]) MARCH 0000000 DX   History of colonic polyps    Hypertension    Hypothyroidism    per patient, takes OTC meds when temperature drops low   IBS (irritable bowel syndrome)    Metastases to the liver ([place]) 11/20/2022   Seizures ([place])    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, 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   Thyroid disease    AGE 82'S UNTIL  2016 OFF ALL THRYOID MEDS NOW    Wears dentures    Wears glasses     SURGICAL HISTORY: Past Surgical History:  Procedure Laterality Date   ABDOMINAL HYSTERECTOMY     PARTIAL   BREAST BIOPSY Left 04/01/2019   INVASIVE DUCTAL CARCINOMA   BREAST SURGERY     CARDIAC CATHETERIZATION  02/10/2016   Procedure: Right/Left Heart Cath and Coronary Angiography;  Surgeon: Leonie Man, MD;  Location: [facility];  Service: Cardiovascular;;   CATARACT EXTRACTION W/ INTRAOCULAR LENS IMPLANT     left   COLONOSCOPY WITH PROPOFOL N/A 02/16/2017   Procedure: COLONOSCOPY WITH PROPOFOL;  Surgeon: Carol Ada, MD;  Location: [facility];  Service: Endoscopy;   Laterality: N/A;   COLONOSCOPY WITH PROPOFOL N/A 08/12/2021   Procedure: COLONOSCOPY WITH PROPOFOL;  Surgeon: Carol Ada, MD;  Location: [facility];  Service: Endoscopy;  Laterality: N/A;   EP IMPLANTABLE DEVICE N/A 02/10/2016   Procedure: ICD Implant;  Surgeon: Will Meredith Leeds, MD;  Location: [facility];  Service: Cardiovascular;  Laterality: N/A;   ESOPHAGOGASTRODUODENOSCOPY (EGD) WITH PROPOFOL N/A 02/16/2017   Procedure: ESOPHAGOGASTRODUODENOSCOPY (EGD) WITH PROPOFOL;  Surgeon: Carol Ada, MD;  Location: [facility];  Service: Endoscopy;  Laterality: N/A;  reflux   IR IMAGING GUIDED PORT INSERTION  04/27/2020   IR IMAGING GUIDED PORT INSERTION  11/17/2022   MASTECTOMY Left    MASTECTOMY MODIFIED RADICAL Left 09/14/2020   Procedure: LEFT MASTECTOMY MODIFIED RADICAL;  Surgeon: Erroll Luna, MD;  Location: [facility];  Service: General;  Laterality: Left;  PEC BLOCK   POLYPECTOMY  08/12/2021   Procedure: POLYPECTOMY;  Surgeon: Carol Ada, MD;  Location: [facility];  Service: Endoscopy;;   TONSILLECTOMY      SOCIAL HISTORY: Social History  Socioeconomic History   Marital status: Widowed    Spouse name: Not on file   Number of children: Not on file   Years of education: Not on file   Highest education level: Not on file  Occupational History   Not on file  Tobacco Use   Smoking status: Every Day    Packs/day: 0.25    Years: 50.00    Total pack years: 12.50    Types: Cigarettes   Smokeless tobacco: Never   Tobacco comments:    3 puffs of each cigarette  Vaping Use   Vaping Use: Never used  Substance and Sexual Activity   Alcohol use: No   Drug use: No   Sexual activity: Not Currently  Other Topics Concern   Not on file  Social History Narrative   Not on file   Social Determinants of Health   Financial Resource Strain: Not on file  Food Insecurity: Not on file  Transportation Needs: Not on file  Physical Activity: Not on file  Stress: Not on file   Social Connections: Not on file  Intimate Partner Violence: Not on file    FAMILY HISTORY: Family History  Problem Relation Age of Onset   Diabetes Mother    Stroke Mother    Diabetes Brother    Diabetes Maternal Grandmother    Diabetes Paternal Grandfather    Heart failure Paternal Grandfather    Hypertension Brother    Heart attack Neg Hx     Review of Systems  Constitutional:  Negative for appetite change, chills, fatigue, fever and unexpected weight change.  HENT:   Negative for hearing loss, lump/mass and trouble swallowing.   Eyes:  Negative for eye problems and icterus.  Respiratory:  Negative for chest tightness, cough and shortness of breath.   Cardiovascular:  Negative for chest pain, leg swelling and palpitations.  Gastrointestinal:  Negative for abdominal distention, abdominal pain, constipation, diarrhea, nausea and vomiting.  Endocrine: Negative for hot flashes.  Genitourinary:  Negative for difficulty urinating.   Musculoskeletal:  Negative for arthralgias.  Skin:  Negative for itching and rash.  Neurological:  Positive for seizures. Negative for dizziness, extremity weakness, headaches and numbness.  Hematological:  Negative for adenopathy. Does not bruise/bleed easily.  Psychiatric/Behavioral:  Negative for depression. The patient is not nervous/anxious.       PHYSICAL EXAMINATION  ECOG PERFORMANCE STATUS: 1 - Symptomatic but completely ambulatory  Vitals:   01/02/23 1127  BP: (!) 159/95  Pulse: 93  Resp: 16  Temp: 97.7 F (36.5 C)  SpO2: 100%    Physical Exam Constitutional:      General: She is not in acute distress.    Appearance: Normal appearance. She is not toxic-appearing.  HENT:     Head: Normocephalic and atraumatic.  Eyes:     General: No scleral icterus. Cardiovascular:     Rate and Rhythm: Normal rate and regular rhythm.     Pulses: Normal pulses.     Heart sounds: Normal heart sounds.  Pulmonary:     Effort: Pulmonary effort  is normal.     Breath sounds: Normal breath sounds.  Abdominal:     General: Abdomen is flat. Bowel sounds are normal. There is no distension.     Palpations: Abdomen is soft.     Tenderness: There is no abdominal tenderness.  Musculoskeletal:        General: No swelling.     Cervical back: Neck supple.  Lymphadenopathy:     Cervical:  No cervical adenopathy.  Skin:    General: Skin is warm and dry.     Findings: No rash.  Neurological:     General: No focal deficit present.     Mental Status: She is alert.  Psychiatric:        Mood and Affect: Mood normal.        Behavior: Behavior normal.     LABORATORY DATA:  CBC    Component Value Date/Time   WBC 6.4 01/02/2023 1105   WBC 4.1 12/19/2022 1828   RBC 4.06 01/02/2023 1105   HGB 12.3 01/02/2023 1105   HGB 13.9 06/20/2012 1055   HCT 36.2 01/02/2023 1105   HCT 40.1 06/20/2012 1055   PLT 231 01/02/2023 1105   PLT 308 06/20/2012 1055   MCV 89.2 01/02/2023 1105   MCV 93 06/20/2012 1055   MCH 30.3 01/02/2023 1105   MCHC 34.0 01/02/2023 1105   RDW 15.2 01/02/2023 1105   RDW 14.3 06/20/2012 1055   LYMPHSABS 1.0 01/02/2023 1105   LYMPHSABS 2.1 06/20/2012 1055   MONOABS 0.6 01/02/2023 1105   MONOABS 0.6 06/20/2012 1055   EOSABS 0.0 01/02/2023 1105   EOSABS 0.0 06/20/2012 1055   BASOSABS 0.0 01/02/2023 1105   BASOSABS 0.1 06/20/2012 1055    CMP     Component Value Date/Time   NA 137 01/02/2023 1105   NA 141 06/20/2012 1055   K 4.1 01/02/2023 1105   K 3.6 06/20/2012 1055   CL 106 01/02/2023 1105   CL 109 (H) 06/20/2012 1055   CO2 24 01/02/2023 1105   CO2 25 06/20/2012 1055   GLUCOSE 190 (H) 01/02/2023 1105   GLUCOSE 173 (H) 06/20/2012 1055   BUN 13 01/02/2023 1105   BUN 12 06/20/2012 1055   CREATININE 0.74 01/02/2023 1105   CREATININE 0.92 05/09/2016 1053   CALCIUM 9.5 01/02/2023 1105   CALCIUM 9.0 06/20/2012 1055   PROT 6.5 01/02/2023 1105   ALBUMIN 3.8 01/02/2023 1105   AST 12 (L) 01/02/2023 1105   ALT 7  01/02/2023 1105   ALKPHOS 92 01/02/2023 1105   BILITOT 0.4 01/02/2023 1105   GFRNONAA >60 01/02/2023 1105   GFRNONAA >60 06/20/2012 1055   GFRAA >60 08/02/2020 0853   GFRAA >60 06/20/2012 1055      ASSESSMENT and THERAPY PLAN:   Malignant neoplasm of upper-outer quadrant of left breast in female, estrogen receptor positive (H[facility]) Marth is here for follow-up of her metastatic breast cancer prior to receiving treatment with [name].  She is due for cycle 3 of therapy however with both cycle 2-day 1 and day 8 she had seizure-like activity and was in the emergency room after both of those treatments.  She has been prescribed Keppra and has been taking this however after thorough discussion we have opted to postpone cycle 3-day 1 for another week to allow the [place] to get in her system more.  She will also have the Valium which she can take prior to her coming to the cancer center.  Also placed a referral to palliative care as she has considered stopping therapy if this activity continues.  We will see her back in 1 week for labs, follow-up, and her next visit.   All questions were answered. The patient knows to call the clinic with any problems, questions or concerns. We can certainly see the patient much sooner if necessary.  Total encounter time:40 minutes*in face-to-face visit time, chart review, lab review, care coordination, order entry, and documentation of  the encounter time.    Wilber Bihari, NP 01/02/23 12:03 PM Medical Oncology and Hematology [facility] [place], [place] [address] Tel. [phone]    Fax. [phone]  *Total Encounter Time as defined by the Centers for Medicare and Medicaid Services includes, in addition to the face-to-face time of a patient visit (documented in the note above) non-face-to-face time: obtaining and reviewing outside history, ordering and reviewing medications, tests or procedures, care coordination  (communications with other health care professionals or caregivers) and documentation in the medical record.

## 2023-01-03 ENCOUNTER — Telehealth: Payer: Self-pay | Admitting: Hematology and Oncology

## 2023-01-03 LAB — CANCER ANTIGEN 27.29: CA 27.29: 47.9 U/mL — ABNORMAL HIGH (ref 0.0–38.6)

## 2023-01-03 NOTE — Telephone Encounter (Signed)
Scheduled appointments per WQ. Patient is aware of all made appointments.

## 2023-01-04 ENCOUNTER — Other Ambulatory Visit: Payer: Self-pay

## 2023-01-04 NOTE — Progress Notes (Signed)
Patient Care Team: [name], MD as PCP - General (Family Medicine) Kyung Rudd, MD as Consulting Physician (Radiation Oncology) Nicholas Lose, MD as Consulting Physician (Hematology and Oncology) Erroll Luna, MD as Consulting Physician (General Surgery)  DIAGNOSIS: No diagnosis found.  SUMMARY OF ONCOLOGIC HISTORY: Oncology History  Malignant neoplasm of upper-outer quadrant of left breast in female, estrogen receptor positive ([place])  03/22/2020 Initial Diagnosis   Diffuse left breast swelling. skin thickening and two areas of irregular hypoechogenicity in the 2-3 o'clock region, 4.9cm, and at least 4 abnormal lymph nodes  with cortical thickening. 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    10/28/2020 - 12/17/2020 Radiation Therapy   The patient initially received a dose of 50.4 Gy in 28 fractions to the left breast and supraclavicular region using whole-breast tangent fields. This was delivered using a 3-D conformal technique. The pt received a boost delivering an additional 10 Gy in 5 fractions using a electron boost with 63mV electrons. The total dose was 60.4 Gy.   11/2020 - 11/2027 Anti-estrogen oral therapy   Anastrozole   01/30/2022 Miscellaneous   Guardant360:ERBB2 (exon 20 insertion): Benefit from Enhertu, MSI high not detected   11/20/2022 Cancer Staging   Staging form: Breast, AJCC 8th Edition - Pathologic: Stage IV (pM1) - Signed by CGardenia Phlegm NP on 11/20/2022    11/20/2022 -  Chemotherapy   Patient is on Treatment Plan : BREAST METASTATIC Sacituzumab govitecan-hziy ([name] D1,8 q21d       CHIEF COMPLIANT: metastatic breast cancer  cycle 3 TIvette Loyal   INTERVAL HISTORY: Colleen MESSERLIis a 82y.o. with above-mentioned history of metastatic breast cancer. She presents to the clinic today for a follow-up and treatment.     ALLERGIES:  is allergic to alcohol, contrast media [iodinated contrast media], iodine, atorvastatin, dust mite extract, latex, lisinopril, and molds & smuts.  MEDICATIONS:  Current Outpatient Medications  Medication Sig Dispense Refill   alendronate (FOSAMAX) 70 MG tablet Take 1 tablet (70 mg total) by mouth once a week. Take with a full glass of water on an empty stomach. 12 tablet 3   Ascorbic Acid (VITAMIN C PO) Take 2,000 mg by mouth daily.     B Complex Vitamins (VITAMIN B COMPLEX) TABS Take 2 tablets by mouth daily.     B Complex-Folic Acid (B COMPLEX PLUS PO) Take 4 tablets by mouth daily.     Beta Carotene (VITAMIN A) 25000 UNIT capsule Take 50,000 Units by mouth daily.     CALCIUM PO Take 10 mLs by mouth daily. Liquid 300 MG /5 ML     Cholecalciferol (VITAMIN D3) 5000 units CAPS Take 5,000 Units by mouth daily. 2500 units per cap     Flaxseed, Linseed, (FLAXSEED OIL) 1000 MG CAPS Take 3,000 mg by mouth daily. Taking as needed     furosemide (LASIX) 20 MG tablet Take 20 mg by mouth daily.     Homeopathic Products (ARNICARE PAIN RELIEF EX) Apply 1 application  topically daily as needed (pain).     levETIRAcetam (KEPPRA) 500 MG tablet Take 1 tablet (500 mg total) by mouth 2 (two) times daily. 60 tablet 0   lidocaine-prilocaine (EMLA) cream Apply to affected area once 30 g 3   loperamide (IMODIUM) 2 MG capsule Take 2 tabs by mouth with first loose stool, then 1 tab with each additional loose stool as needed. Do not exceed 8 tabs in a 24-hour period 30 capsule 0   losartan (COZAAR) 50 MG tablet Take 50 mg by mouth daily.      MAGNESIUM PO Take 5 mLs by mouth daily. Liquid 2.5 ml/150 mg     Misc Natural Products (BLOOD SUGAR BALANCE PO) Take 3 capsules by mouth daily. Blood Sugar Manager     ondansetron (ZOFRAN) 8 MG tablet Take 1 tablet (8 mg total) by mouth every 8 (eight) hours as needed for nausea or vomiting. Take 1 tablets ('8mg'$  total) by mouth every 8hrs as needed. Start on the third day after chemotherapy. (Patient not taking: Reported on 11/15/2022) 30 tablet 1   OVER THE COUNTER MEDICATION Take 1 Scoop by mouth daily. Branched chain amino acids 4000  Patient states "taking for muscles"     OVER THE COUNTER MEDICATION Take 400-800 mg by mouth daily as needed (Based on Blood glucose). gymnema sylvestre Patient takes for "lowering blood glucose"     OVER THE COUNTER MEDICATION Take 5 capsules by mouth daily. Pau d' arco 500 mg each  Patient states now taking liquid and "keeps mold down"     OVER THE COUNTER MEDICATION Take 6 capsules by mouth daily. Caprylic acid Patient taking to "lower candida risk"     OVER THE COUNTER MEDICATION Take 5 capsules by mouth daily. Pancreatic enzymes Patient taking "to get fluid out"     OVER THE COUNTER MEDICATION Take 3 capsules by mouth daily. Raw Adrenal Patient taking for "overall energy"     OVER THE COUNTER MEDICATION Take 6 capsules by mouth daily. Beef Pancreas Patient taking for "pancreatic function improvement and lymphatic system improvement"     PANTETHINE PO Take 1-2 tablets by mouth daily as needed (Itching). Vitamin B5     potassium chloride SA (K-DUR,KLOR-CON) 20 MEQ tablet Take 20 mEq by mouth 2 (two) times daily.     predniSONE (DELTASONE) 50 MG tablet Take 1 tab 13, 7, and 1 hour prior to CT scan.  Take benadryl '25mg'$  oral 1 hour prior to CT scan 3 tablet 0   prochlorperazine (COMPAZINE) 10 MG tablet Take 1 tablet (10 mg total) by mouth every 6 (six) hours as needed for nausea or vomiting. (Patient not taking: Reported on 11/20/2022) 30 tablet 1   PROLENSA 0.07  % SOLN Place 1 drop into the right eye 4 (four) times daily.     Propylene Glycol (SYSTANE BALANCE) 0.6 % SOLN Place 2 drops into the right eye in the morning and at bedtime.     TRUE METRIX BLOOD GLUCOSE TEST test strip SMARTSIG:Via Meter     TRUEplus Lancets 33G MISC      No current facility-administered medications for this visit.    PHYSICAL EXAMINATION: ECOG PERFORMANCE STATUS: {CHL ONC ECOG [id number]}  There were no vitals filed for this visit. There were no vitals filed for this visit.  BREAST:*** No palpable masses or nodules in either right or left breasts. No palpable axillary supraclavicular or infraclavicular adenopathy no breast tenderness or nipple discharge. (exam performed in the presence of a chaperone)  LABORATORY DATA:  I have reviewed the data as listed    Latest Ref Rng & Units 01/02/2023   11:05 AM 12/19/2022    6:28 PM 12/19/2022   11:11 AM  CMP  Glucose 70 - 99 mg/dL 190  246  241   BUN 8 - 23 mg/dL '13  12  12   '$ Creatinine 0.44 - 1.00 mg/dL 0.74  0.73  0.75   Sodium 135 - 145 mmol/L 137  133  136   Potassium 3.5 - 5.1 mmol/L 4.1  4.5  3.8   Chloride 98 - 111 mmol/L 106  105  107   CO2 22 - 32 mmol/L '24  20  22   '$ Calcium 8.9 - 10.3 mg/dL 9.5  8.4  9.0   Total Protein 6.5 - 8.1 g/dL 6.5  6.5  6.2   Total Bilirubin 0.3 - 1.2 mg/dL 0.4  0.5  0.2   Alkaline Phos 38 - 126 U/L 92  76  92   AST 15 - 41 U/L '12  20  12   '$ ALT 0 - 44 U/L '7  11  8     '$ Lab Results  Component Value Date   WBC 6.4 01/02/2023   HGB 12.3 01/02/2023   HCT 36.2 01/02/2023   MCV 89.2 01/02/2023   PLT 231 01/02/2023   NEUTROABS 4.7 01/02/2023    ASSESSMENT & PLAN:  No problem-specific Assessment & Plan notes found for this encounter.    No orders of the defined types were placed in this encounter.  The patient has a good understanding of the overall plan. she agrees with it. she will call with any problems that may develop before the next visit here. Total time spent: 30 mins  including face to face time and time spent for planning, charting and co-ordination of care   Suzzette Righter, [place] 01/04/23    I Gardiner Coins am acting as a Education administrator for [employer]  ***

## 2023-01-08 ENCOUNTER — Other Ambulatory Visit: Payer: Self-pay

## 2023-01-08 DIAGNOSIS — Z170 Estrogen receptor positive status [ER+]: Secondary | ICD-10-CM

## 2023-01-08 MED ORDER — LIDOCAINE-PRILOCAINE 2.5-2.5 % EX CREA: TOPICAL_CREAM | CUTANEOUS | 3 refills | Status: DC

## 2023-01-08 MED FILL — Fosaprepitant Dimeglumine For IV Infusion 150 MG (Base Eq): INTRAVENOUS | Qty: 5 | Status: AC

## 2023-01-08 MED FILL — Dexamethasone Sodium Phosphate Inj 100 MG/10ML: INTRAMUSCULAR | Qty: 1 | Status: AC

## 2023-01-08 NOTE — Progress Notes (Unsigned)
[facility]  [phone]) [phone] Fax:(336) [phone]   Name: Colleen Lowery Date: 01/08/2023 MRN: [id number]  DOB: 14-Apr-1941  Patient Care Team: [name], MD as PCP - General (Family Medicine) Kyung Rudd, MD as Consulting Physician (Radiation Oncology) Nicholas Lose, MD as Consulting Physician (Hematology and Oncology) Erroll Luna, MD as Consulting Physician (General Surgery)    REASON FOR CONSULTATION: Colleen Lowery is a 82 y.o. 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 Past medical history also includes seizures, diabetes, and HTN. Palliative ask to see for symptom management and goals of care.    SOCIAL HISTORY:     reports that she has been smoking cigarettes. She has a 12.50 pack-year smoking history. She has never used smokeless tobacco. She reports that she does not drink alcohol and does not use drugs.  ADVANCE DIRECTIVES: MOST form completed 2/27. Patient does not have documented advanced directive however would like to complete. Packet provided and referral sent to Social Work team for [facility]. Colleen Lowery reports her brother, Belenda Cruise would be her primary decision maker with support from their sister, Gracelyn Nurse.   CODE STATUS: DNR  PAST MEDICAL HISTORY: Past Medical History:  Diagnosis Date   AICD (automatic cardioverter/defibrillator) present    Allergy    Arthritis    FINGERS   Cancer ([place])    left breast   Cataract    right   Crohn disease ([place])    Depression    Diabetes mellitus without complication ([facility])    TYPE 2 , TAKING OTC MED FORGYMEMA   GERD (gastroesophageal reflux disease)    Headache    sinus   Heart failure, acute, systolic and diastolic ([place]) MARCH 0000000 DX   History of colonic polyps    Hypertension    Hypothyroidism    per patient, takes OTC  meds when temperature drops low   IBS (irritable bowel syndrome)    Metastases to the liver ([place]) 11/20/2022   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, 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   Thyroid disease    AGE 86'S UNTIL  2016 OFF ALL THRYOID MEDS NOW    Wears dentures    Wears glasses     PAST SURGICAL HISTORY:  Past Surgical History:  Procedure Laterality Date   ABDOMINAL HYSTERECTOMY     PARTIAL   BREAST BIOPSY Left 04/01/2019   INVASIVE DUCTAL CARCINOMA   BREAST SURGERY     CARDIAC CATHETERIZATION  02/10/2016   Procedure: Right/Left Heart Cath and Coronary Angiography;  Surgeon: Leonie Man, MD;  Location: [facility];  Service: Cardiovascular;;   CATARACT EXTRACTION W/ INTRAOCULAR LENS IMPLANT     left   COLONOSCOPY WITH PROPOFOL N/A 02/16/2017   Procedure: COLONOSCOPY WITH PROPOFOL;  Surgeon: Carol Ada, MD;  Location: [facility];  Service: Endoscopy;  Laterality: N/A;   COLONOSCOPY WITH PROPOFOL N/A 08/12/2021   Procedure: COLONOSCOPY WITH PROPOFOL;  Surgeon: Carol Ada, MD;  Location: [facility];  Service: Endoscopy;  Laterality: N/A;   EP IMPLANTABLE DEVICE N/A 02/10/2016   Procedure: ICD Implant;  Surgeon: Will Meredith Leeds, MD;  Location: [facility];  Service: Cardiovascular;  Laterality: N/A;   ESOPHAGOGASTRODUODENOSCOPY (EGD) WITH PROPOFOL N/A 02/16/2017   Procedure: ESOPHAGOGASTRODUODENOSCOPY (EGD) WITH PROPOFOL;  Surgeon: Carol Ada, MD;  [facility];  Service: Endoscopy;  Laterality:  N/A;  reflux   IR IMAGING GUIDED PORT INSERTION  04/27/2020   IR IMAGING GUIDED PORT INSERTION  11/17/2022   MASTECTOMY Left    MASTECTOMY MODIFIED RADICAL Left 09/14/2020   Procedure: LEFT MASTECTOMY MODIFIED RADICAL;  Surgeon: Erroll Luna, MD;  Location: [facility];  Service: General;  Laterality: Left;  PEC BLOCK   POLYPECTOMY  08/12/2021   Procedure: POLYPECTOMY;  Surgeon: Carol Ada, MD;  [facility]: [facility];  Service: Endoscopy;;    TONSILLECTOMY      HEMATOLOGY/ONCOLOGY HISTORY:  Oncology History  Malignant neoplasm of upper-outer quadrant of left breast in female, estrogen receptor positive ([place])  03/22/2020 Initial Diagnosis   Diffuse left breast swelling. skin thickening and two areas of irregular hypoechogenicity in the 2-3 o'clock region, 4.9cm, and at least 4 abnormal lymph nodes  with cortical thickening. 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    10/28/2020 - 12/17/2020 Radiation Therapy   The patient initially received a dose of 50.4 Gy in 28 fractions to the left breast and supraclavicular region using whole-breast tangent fields. This was delivered using a 3-D conformal technique. The pt received a boost delivering an additional 10 Gy in 5 fractions using a electron boost with 44mV electrons. The total dose was 60.4 Gy.   11/2020 - 11/2027 Anti-estrogen oral therapy   Anastrozole   01/30/2022 Miscellaneous   Guardant360:ERBB2 (exon 20 insertion): Benefit from Enhertu, MSI high not detected   11/20/2022 Cancer Staging   Staging form: Breast, AJCC 8th Edition - Pathologic: Stage IV (pM1) - Signed by CGardenia Phlegm NP on 11/20/2022   11/20/2022 -  Chemotherapy   Patient is on Treatment Plan : BREAST METASTATIC Sacituzumab govitecan-hziy ([name] D1,8 q21d       ALLERGIES:  is allergic to alcohol, contrast media [iodinated contrast media], iodine, atorvastatin, dust mite extract, latex, lisinopril, and molds &  smuts.  MEDICATIONS:  Current Outpatient Medications  Medication Sig Dispense Refill   alendronate (FOSAMAX) 70 MG tablet Take 1 tablet (70 mg total) by mouth once a week. Take with a full glass of water on an empty stomach. 12 tablet 3   Ascorbic Acid (VITAMIN C PO) Take 2,000 mg by mouth daily.     B Complex Vitamins (VITAMIN B COMPLEX) TABS Take 2 tablets by mouth daily.     B Complex-Folic Acid (B COMPLEX PLUS PO) Take 4 tablets by mouth daily.     Beta Carotene (VITAMIN A) 25000 UNIT capsule Take 50,000 Units by mouth daily.     CALCIUM PO Take 10 mLs by mouth daily. Liquid 300 MG /5 ML     Cholecalciferol (VITAMIN D3) 5000 units CAPS Take 5,000 Units by mouth daily. 2500 units per cap     Flaxseed, Linseed, (FLAXSEED OIL) 1000 MG CAPS Take 3,000 mg by mouth daily. Taking as needed     furosemide (LASIX) 20 MG tablet Take 20 mg by mouth daily.     Homeopathic Products (ARNICARE PAIN RELIEF EX) Apply 1 application  topically daily as needed (pain).     levETIRAcetam (KEPPRA) 500 MG tablet Take 1 tablet (500 mg total) by mouth 2 (two) times daily. 60 tablet 0   lidocaine-prilocaine (EMLA) cream Apply to affected area once 30 g 3   loperamide (IMODIUM) 2 MG capsule Take 2 tabs by mouth with first loose stool, then 1 tab with each additional loose stool as needed. Do not exceed 8 tabs in a 24-hour period 30 capsule 0   losartan (COZAAR) 50 MG tablet Take 50 mg by mouth daily.     MAGNESIUM PO Take 5 mLs by mouth daily. Liquid 2.5 ml/150 mg     Misc Natural Products (BLOOD SUGAR BALANCE PO) Take 3 capsules by mouth daily. Blood Sugar Manager     ondansetron (ZOFRAN) 8 MG tablet Take 1 tablet (8 mg total) by mouth every 8 (eight) hours as needed for nausea or vomiting. Take 1 tablets ('8mg'$  total) by mouth every 8hrs as needed. Start on the third day after chemotherapy. (Patient not taking: Reported on 11/15/2022) 30 tablet 1   OVER THE COUNTER MEDICATION Take 1 Scoop by mouth daily. Branched  chain amino acids 4000  Patient states "taking for muscles"     OVER THE COUNTER MEDICATION Take 400-800 mg by mouth daily as needed (Based on Blood glucose). gymnema sylvestre Patient takes for "lowering blood glucose"     OVER THE COUNTER MEDICATION Take 5 capsules by mouth daily. Pau d' arco 500 mg each  Patient states now taking liquid and "keeps mold down"     OVER THE COUNTER MEDICATION Take 6 capsules by mouth daily. Caprylic acid Patient taking to "lower candida risk"     OVER THE COUNTER MEDICATION Take 5 capsules by mouth daily. Pancreatic enzymes Patient taking "to get fluid out"     OVER THE COUNTER MEDICATION Take 3 capsules by mouth daily. Raw Adrenal Patient taking for "overall energy"     OVER THE COUNTER MEDICATION Take 6 capsules by mouth daily. Beef Pancreas Patient taking for "pancreatic function improvement and lymphatic system improvement"     PANTETHINE PO Take 1-2 tablets by mouth daily as needed (Itching). Vitamin B5     potassium chloride SA (K-DUR,KLOR-CON) 20 MEQ tablet Take 20 mEq by mouth 2 (two) times daily.     predniSONE (DELTASONE) 50 MG tablet Take 1 tab 13, 7, and 1 hour prior to CT scan.  Take benadryl '25mg'$  oral 1 hour prior to CT scan 3 tablet 0   prochlorperazine (COMPAZINE) 10 MG tablet Take 1 tablet (10 mg total) by mouth every 6 (six) hours as needed for nausea or vomiting. (Patient not taking: Reported on 11/20/2022) 30 tablet 1   PROLENSA 0.07 % SOLN Place 1 drop into the right eye 4 (four) times daily.     Propylene Glycol (SYSTANE BALANCE) 0.6 % SOLN Place 2 drops into the right eye in the morning and at bedtime.     TRUE METRIX BLOOD GLUCOSE TEST test strip SMARTSIG:Via Meter     TRUEplus Lancets 33G MISC      No current facility-administered medications for this visit.    VITAL SIGNS: T 97.8, HR 68, R 16, BP 164/99 Estimated body mass index is 24.84 kg/m as calculated from the following:   Height as of 01/02/23: '5\' 2"'$  (1.575 m).   Weight  as of 01/02/23: 135 lb 12.8 oz (61.6 kg).  LABS: CBC:    Component Value Date/Time   WBC 6.4 01/02/2023 1105  WBC 4.1 12/19/2022 1828   HGB 12.3 01/02/2023 1105   HGB 13.9 06/20/2012 1055   HCT 36.2 01/02/2023 1105   HCT 40.1 06/20/2012 1055   PLT 231 01/02/2023 1105   PLT 308 06/20/2012 1055   MCV 89.2 01/02/2023 1105   MCV 93 06/20/2012 1055   NEUTROABS 4.7 01/02/2023 1105   NEUTROABS 3.4 06/20/2012 1055   LYMPHSABS 1.0 01/02/2023 1105   LYMPHSABS 2.1 06/20/2012 1055   MONOABS 0.6 01/02/2023 1105   MONOABS 0.6 06/20/2012 1055   EOSABS 0.0 01/02/2023 1105   EOSABS 0.0 06/20/2012 1055   BASOSABS 0.0 01/02/2023 1105   BASOSABS 0.1 06/20/2012 1055   Comprehensive Metabolic Panel:    Component Value Date/Time   NA 137 01/02/2023 1105   NA 141 06/20/2012 1055   K 4.1 01/02/2023 1105   K 3.6 06/20/2012 1055   CL 106 01/02/2023 1105   CL 109 (H) 06/20/2012 1055   CO2 24 01/02/2023 1105   CO2 25 06/20/2012 1055   BUN 13 01/02/2023 1105   BUN 12 06/20/2012 1055   CREATININE 0.74 01/02/2023 1105   CREATININE 0.92 05/09/2016 1053   GLUCOSE 190 (H) 01/02/2023 1105   GLUCOSE 173 (H) 06/20/2012 1055   CALCIUM 9.5 01/02/2023 1105   CALCIUM 9.0 06/20/2012 1055   AST 12 (L) 01/02/2023 1105   ALT 7 01/02/2023 1105   ALKPHOS 92 01/02/2023 1105   BILITOT 0.4 01/02/2023 1105   PROT 6.5 01/02/2023 1105   ALBUMIN 3.8 01/02/2023 1105    RADIOGRAPHIC STUDIES:  PERFORMANCE STATUS (ECOG) : 1 - Symptomatic but completely ambulatory  Review of Systems  Respiratory:         Shortness of breath at times. Thick, clear, gel-like secretions.  Gastrointestinal:  Positive for abdominal distention.       "Big liver"  Allergic/Immunologic: Positive for environmental allergies and food allergies.  Psychiatric/Behavioral:  The patient is nervous/anxious.        Anxiety related to going out in public; concern for exposure to allergens   Unless otherwise noted, a complete review of systems  is negative.  Physical Exam General: NAD, well-nourished, well-groomed. Cardiovascular: regular rate and rhythm Pulmonary: lung sounds clear, diminished throughout Abdomen: soft, rounded, right side palpated swollen as compared with left, non-tender, + bowel sounds GU: no suprapubic tenderness Extremities: mild non-pitting edema to extremities, no joint deformities Skin: no rashes Neurological: grossly intact Psychiatric: alert, oriented x 4, engaging  IMPRESSION: Ms. Kriebel presents to visit alone and reports living alone. She has been widowed for several decades and has one adult son who lives in [place]. Her support system includes friends, two sisters, and one brother. No pets in the home. Ms. Cheesman is currently retired. 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  Ms. Alberici is observed wearing multiple layers of masks for protection against noxious odors. Reports new onset seizures precipitated by Purell Sanitizer and subsequent hospitalization. On Keppra x 2 weeks now. Reports body has become increasingly sensitive over recent months.  I introduced myself, Alda Lea, NP, [place] RN, and Palliative's role in collaboration with the oncology team. Concept of Palliative Care was introduced as specialized medical care for people and their families living with serious illness.  It focuses on providing relief from the symptoms and stress of a serious illness.  The goal is to improve quality of life for both the patient and  the family. Values and goals of care important to patient and family were attempted to be elicited.   Patient prefers to treat medical problems holistically when possible. Medications are reviewed. She takes Fosamax, Lasix, Potassium, and Keppra on a regular basis. The supplements and eye drops she takes PRN based on situation.  We discussed  Ms. Seedorf's current illness and what it means in the larger context of Her on-going co-morbidities. Natural disease trajectory and expectations were discussed.  Ms. Manka states that her Breast Cancer and liver metastasis were very surprising for her, given that nobody in her family has had diagnosis of cancer. She is able to verbalize and owns the emotion of anger related to being the only person in her family with this diagnosis. She endorses a family history of "dropsy" and, while she has never been acutely/severely ill, feels that she may have "dropsy." Over the years, has experienced swelling and redness to her hands as well as multiple sensitivities. She shares she has always been an independent woman and very active.   Anxiety: ColleenSauls reports anxiety with having to go out in public because she is afraid of coming into contact with strong odors that might trigger more seizures. Reports Valium was recently prescribed, she had trouble obtaining from pharmacy initially, but now able to pick-up and plans to utilize soon.  2. Dyspnea: Difficulty breathing and reports coughing up thick, clear, gel-like secretions at times. Reports this has improved since chemotherapy.   3. Pain: Denies pain related to her cancer. Endorses some abdominal fullness and states that she knows she has a "big liver." Reports resting well at night. States she stays up late, but has always done that and prefers to do so.  4. Appetite: Endorses good oral intake. Denies N/V/D/C. Reports issues only when ingesting food to which she is sensitive. Her current weight is 135lbs down from 138lbs on 1/29.    5. Goals of Care Ms. Pehrson is clear in expressed wishes to continue to treat the thre treatable allowing her every opportunity to continue to thrive. Taking life one day at a time.   I empathetically approached discussions regarding code status and advanced directives. She does not have a documented advanced directives. Her  brother is an Forensic psychologist and have mentioned completing however she has not been able to do so. Advance directive education is provided and patient verbalizes understanding. Alinna expressed wishes to completed documents at the AD clinic. Understands a referral will be placed. Plans to attend advance directive clinic to complete HCPOA paperwork once scheduled. Packet provided to review and begin completing with understanding of what sections she is able to appropriately complete prior to appointment. She would want two people involved in her care in the event she were unable to speak for herself: 1) Hartford Poli, brother 2) Barnett Applebaum "Benedetto Coons, sister.  We discussed her full code status. Patient is clear in her expressed wishes that she would NOT want to be resuscitated in the event of cardiac/pulmonary arrest. Education provided on DNR. Patient again confirms wishes requesting appropriate documents to have in her home. DNR and MOST form are completed at this visit. Original copies given to patient.   Education provided on MOST form. The patient outlined her wishes for the following treatment decisions:  Cardiopulmonary Resuscitation: Do Not Attempt Resuscitation (DNR/No CPR)  Medical Interventions: Limited Additional Interventions: Use medical treatment, IV fluids and cardiac monitoring as indicated, DO NOT USE intubation or mechanical ventilation. May consider use of less invasive airway  support such as BiPAP or CPAP. Also provide comfort measures. Transfer to the hospital if indicated. Avoid intensive care.   Antibiotics: Determine use of limitation of antibiotics when infection occurs  IV Fluids: IV fluids for a defined trial period  Feeding Tube: Feeding tube for a defined trial period    I discussed the importance of continued conversation with family and their medical providers regarding overall plan of care and treatment options, ensuring decisions are within the context of the patients values  and GOCs.  PLAN: Established therapeutic relationship. Education provided on palliative's role in collaboration with their Oncology/Radiation team. Monitor tolerance and efficacy of newly prescribed Keppra. Monitor tolerance and efficacy of newly prescribed Valium. Encourage reporting of any symptoms that may arise. Assist with schedule of Advance Care Planning clinic. CSW referral placed. Education provided along with packet.  DNR as requested and confirmed by patient. Out of facility form completed and patient given original copy. Scanned into [facility]. MOST form completed. See above. Original copy given to patient and scanned into [facility].  Palliative will plan to see patient back in 2-4 weeks in collaboration to other oncology appointments.    Patient expressed understanding and was in agreement with this plan. She also understands that She can call the clinic at any time with any questions, concerns, or complaints.   Thank you for your referral and allowing Palliative to assist in Ms. Mendeltna care.   Number and complexity of problems addressed: HIGH - 1 or more chronic illnesses with SEVERE exacerbation, progression, or side effects of treatment - advanced cancer, pain. Any controlled substances utilized were prescribed in the context of palliative care.  Time Total: 55 min   I assessed patient with Levada Dy, NP Student. Agree with above findings.   Visit consisted of counseling and education dealing with the complex and emotionally intense issues of symptom management and palliative care in the setting of serious and potentially life-threatening illness.Greater than 50%  of this time was spent counseling and coordinating care related to the above assessment and plan.  Signed by: Moss Mc, RN MSN [facility] / NP Student  Signed by: Alda Lea, AGPCNP-BC Palliative Medicine Team/[facility] [place]

## 2023-01-08 NOTE — Telephone Encounter (Signed)
Pt called to request refill on emla cream. Advised pt per MD ok to refill. Sent to pt's preferred phx. She verbalized thanks and understanding.

## 2023-01-09 ENCOUNTER — Inpatient Hospital Stay (HOSPITAL_BASED_OUTPATIENT_CLINIC_OR_DEPARTMENT_OTHER): Payer: Medicare HMO | Admitting: Nurse Practitioner

## 2023-01-09 ENCOUNTER — Inpatient Hospital Stay: Payer: Medicare HMO

## 2023-01-09 ENCOUNTER — Other Ambulatory Visit: Payer: Self-pay

## 2023-01-09 ENCOUNTER — Encounter: Payer: Self-pay | Admitting: Nurse Practitioner

## 2023-01-09 ENCOUNTER — Inpatient Hospital Stay (HOSPITAL_BASED_OUTPATIENT_CLINIC_OR_DEPARTMENT_OTHER): Payer: Medicare HMO | Admitting: Hematology and Oncology

## 2023-01-09 ENCOUNTER — Other Ambulatory Visit: Payer: Medicare HMO

## 2023-01-09 ENCOUNTER — Inpatient Hospital Stay: Payer: Medicare HMO | Admitting: Dietician

## 2023-01-09 VITALS — BP 153/72 | HR 77 | Temp 97.9°F | Resp 16

## 2023-01-09 VITALS — BP 164/99 | HR 68 | Temp 97.8°F | Resp 16 | Ht 62.0 in | Wt 136.4 lb

## 2023-01-09 VITALS — BP 177/99 | HR 76 | Temp 97.9°F | Resp 12 | Wt 135.8 lb

## 2023-01-09 DIAGNOSIS — C787 Secondary malignant neoplasm of liver and intrahepatic bile duct: Secondary | ICD-10-CM | POA: Diagnosis not present

## 2023-01-09 DIAGNOSIS — F419 Anxiety disorder, unspecified: Secondary | ICD-10-CM

## 2023-01-09 DIAGNOSIS — Z170 Estrogen receptor positive status [ER+]: Secondary | ICD-10-CM

## 2023-01-09 DIAGNOSIS — C50412 Malignant neoplasm of upper-outer quadrant of left female breast: Secondary | ICD-10-CM | POA: Diagnosis not present

## 2023-01-09 DIAGNOSIS — R530 Neoplastic (malignant) related fatigue: Secondary | ICD-10-CM

## 2023-01-09 DIAGNOSIS — R634 Abnormal weight loss: Secondary | ICD-10-CM

## 2023-01-09 DIAGNOSIS — Z7189 Other specified counseling: Secondary | ICD-10-CM

## 2023-01-09 DIAGNOSIS — R630 Anorexia: Secondary | ICD-10-CM

## 2023-01-09 DIAGNOSIS — Z515 Encounter for palliative care: Secondary | ICD-10-CM

## 2023-01-09 DIAGNOSIS — Z95828 Presence of other vascular implants and grafts: Secondary | ICD-10-CM

## 2023-01-09 DIAGNOSIS — Z5112 Encounter for antineoplastic immunotherapy: Secondary | ICD-10-CM | POA: Diagnosis not present

## 2023-01-09 LAB — CMP (CANCER CENTER ONLY)
ALT: 7 U/L (ref 0–44)
AST: 14 U/L — ABNORMAL LOW (ref 15–41)
Albumin: 3.9 g/dL (ref 3.5–5.0)
Alkaline Phosphatase: 90 U/L (ref 38–126)
Anion gap: 5 (ref 5–15)
BUN: 9 mg/dL (ref 8–23)
CO2: 26 mmol/L (ref 22–32)
Calcium: 8.9 mg/dL (ref 8.9–10.3)
Chloride: 107 mmol/L (ref 98–111)
Creatinine: 0.71 mg/dL (ref 0.44–1.00)
GFR, Estimated: >60 mL/min (ref >60)
Glucose, Bld: 109 mg/dL — ABNORMAL HIGH (ref 70–99)
Potassium: 4.1 mmol/L (ref 3.5–5.1)
Sodium: 138 mmol/L (ref 135–145)
Total Bilirubin: 0.4 mg/dL (ref 0.3–1.2)
Total Protein: 6.9 g/dL (ref 6.5–8.1)

## 2023-01-09 LAB — CBC WITH DIFFERENTIAL (CANCER CENTER ONLY)
Abs Immature Granulocytes: 0.06 10*3/uL (ref 0.00–0.07)
Basophils Absolute: 0 10*3/uL (ref 0.0–0.1)
Basophils Relative: 1 %
Eosinophils Absolute: 0 10*3/uL (ref 0.0–0.5)
Eosinophils Relative: 1 %
HCT: 37.3 % (ref 36.0–46.0)
Hemoglobin: 12.5 g/dL (ref 12.0–15.0)
Immature Granulocytes: 1 %
Lymphocytes Relative: 23 %
Lymphs Abs: 1.4 10*3/uL (ref 0.7–4.0)
MCH: 30.4 pg (ref 26.0–34.0)
MCHC: 33.5 g/dL (ref 30.0–36.0)
MCV: 90.8 fL (ref 80.0–100.0)
Monocytes Absolute: 0.8 10*3/uL (ref 0.1–1.0)
Monocytes Relative: 13 %
Neutro Abs: 3.8 10*3/uL (ref 1.7–7.7)
Neutrophils Relative %: 61 %
Platelet Count: 211 10*3/uL (ref 150–400)
RBC: 4.11 MIL/uL (ref 3.87–5.11)
RDW: 15.2 % (ref 11.5–15.5)
WBC Count: 6.1 10*3/uL (ref 4.0–10.5)
nRBC: 0 % (ref 0.0–0.2)

## 2023-01-09 LAB — PHOSPHORUS: Phosphorus: 3.7 mg/dL (ref 2.5–4.6)

## 2023-01-09 LAB — MAGNESIUM: Magnesium: 2 mg/dL (ref 1.7–2.4)

## 2023-01-09 MED ORDER — SODIUM CHLORIDE 0.9% FLUSH
10.0000 mL | INTRAVENOUS | Status: DC | PRN
  Administered 2023-01-09: 10 mL

## 2023-01-09 MED ORDER — PALONOSETRON HCL INJECTION 0.25 MG/5ML
0.2500 mg | Freq: Once | INTRAVENOUS | Status: AC
  Administered 2023-01-09: 0.25 mg via INTRAVENOUS
  Filled 2023-01-09: qty 5

## 2023-01-09 MED ORDER — SODIUM CHLORIDE 0.9 % IV SOLN
150.0000 mg | Freq: Once | INTRAVENOUS | Status: AC
  Administered 2023-01-09: 150 mg via INTRAVENOUS
  Filled 2023-01-09: qty 150
  Filled 2023-01-09: qty 5

## 2023-01-09 MED ORDER — HEPARIN SOD (PORK) LOCK FLUSH 100 UNIT/ML IV SOLN
500.0000 [IU] | Freq: Once | INTRAVENOUS | Status: AC | PRN
  Administered 2023-01-09: 500 [IU]

## 2023-01-09 MED ORDER — FAMOTIDINE IN NACL 20-0.9 MG/50ML-% IV SOLN
20.0000 mg | Freq: Once | INTRAVENOUS | Status: AC
  Administered 2023-01-09: 20 mg via INTRAVENOUS
  Filled 2023-01-09: qty 50

## 2023-01-09 MED ORDER — SODIUM CHLORIDE 0.9 % IV SOLN: Freq: Once | INTRAVENOUS | Status: AC

## 2023-01-09 MED ORDER — CETIRIZINE HCL 10 MG/ML IV SOLN
10.0000 mg | Freq: Once | INTRAVENOUS | Status: AC
  Administered 2023-01-09: 10 mg via INTRAVENOUS
  Filled 2023-01-09: qty 1

## 2023-01-09 MED ORDER — SODIUM CHLORIDE 0.9% FLUSH
10.0000 mL | Freq: Once | INTRAVENOUS | Status: AC
  Administered 2023-01-09: 10 mL

## 2023-01-09 MED ORDER — SODIUM CHLORIDE 0.9 % IV SOLN
10.0000 mg | Freq: Once | INTRAVENOUS | Status: AC
  Administered 2023-01-09: 10 mg via INTRAVENOUS
  Filled 2023-01-09: qty 1
  Filled 2023-01-09: qty 10

## 2023-01-09 MED ORDER — SODIUM CHLORIDE 0.9 % IV SOLN
6.0000 mg/kg | Freq: Once | INTRAVENOUS | Status: AC
  Administered 2023-01-09: 360 mg via INTRAVENOUS
  Filled 2023-01-09: qty 36

## 2023-01-09 MED ORDER — ACETAMINOPHEN 325 MG PO TABS
650.0000 mg | ORAL_TABLET | Freq: Once | ORAL | Status: AC
  Administered 2023-01-09: 650 mg via ORAL
  Filled 2023-01-09: qty 2

## 2023-01-09 NOTE — Patient Instructions (Signed)
[facility]  Discharge Instructions: Thank you for choosing [facility] to provide your oncology and hematology care.   If you have a lab appointment with the [facility], please go directly to the [facility] and check in at the registration area.   Wear comfortable clothing and clothing appropriate for easy access to any Portacath or PICC line.   We strive to give you quality time with your provider. You may need to reschedule your appointment if you arrive late (15 or more minutes).  Arriving late affects you and other patients whose appointments are after yours.  Also, if you miss three or more appointments without notifying the office, you may be dismissed from the clinic at the provider's discretion.      For prescription refill requests, have your pharmacy contact our office and allow 72 hours for refills to be completed.    Today you received the following chemotherapy and/or immunotherapy agents: sacituzumab-govitecan-hziy      To help prevent nausea and vomiting after your treatment, we encourage you to take your nausea medication as directed.  BELOW ARE SYMPTOMS THAT SHOULD BE REPORTED IMMEDIATELY: *FEVER GREATER THAN 100.4 F (38 C) OR HIGHER *CHILLS OR SWEATING *NAUSEA AND VOMITING THAT IS NOT CONTROLLED WITH YOUR NAUSEA MEDICATION *UNUSUAL SHORTNESS OF BREATH *UNUSUAL BRUISING OR BLEEDING *URINARY PROBLEMS (pain or burning when urinating, or frequent urination) *BOWEL PROBLEMS (unusual diarrhea, constipation, pain near the anus) TENDERNESS IN MOUTH AND THROAT WITH OR WITHOUT PRESENCE OF ULCERS (sore throat, sores in mouth, or a toothache) UNUSUAL RASH, SWELLING OR PAIN  UNUSUAL VAGINAL DISCHARGE OR ITCHING   Items with * indicate a potential emergency and should be followed up as soon as possible or go to the Emergency Department if any problems should occur.  Please show the CHEMOTHERAPY ALERT CARD or IMMUNOTHERAPY  ALERT CARD at check-in to the Emergency Department and triage nurse.  Should you have questions after your visit or need to cancel or reschedule your appointment, please contact [facility]  Dept: [phone]  and follow the prompts.  Office hours are 8:00 a.m. to 4:30 p.m. Monday - Friday. Please note that voicemails left after 4:00 p.m. may not be returned until the following business day.  We are closed weekends and major holidays. You have access to a nurse at all times for urgent questions. Please call the main number to the clinic Dept: [phone] and follow the prompts.   For any non-urgent questions, you may also contact your provider using MyChart. We now offer e-Visits for anyone 69 and older to request care online for non-urgent symptoms. For details visit [email or web address].   Also download the MyChart app! Go to the app store, search "MyChart", open the app, select [facility], and log in with your MyChart username and password.

## 2023-01-09 NOTE — Progress Notes (Unsigned)
Nutrition Assessment   Reason for Assessment: Pt request   ASSESSMENT: 82 year old female with metastatic breast cancer.       Medications: ***   Labs: ***   Anthropometrics:   Height: 5'2" Weight: 135 lb 12.8 oz  UBW:  BMI: 24.84     NUTRITION DIAGNOSIS: ***   MALNUTRITION DIAGNOSIS: ***   INTERVENTION: ***   MONITORING, EVALUATION, GOAL: ***   Next Visit: No follow-up scheduled. Pt encouraged to contact with nutrition questions/concerns

## 2023-01-09 NOTE — Assessment & Plan Note (Signed)
03/22/2020:diffuse left breast swelling. skin thickening and two areas of irregular hypoechogenicity in the 2-3 o'clock region, 4.9cm, and at least 4 abnormal lymph nodes  with cortical thickening. 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  Neoadjuvant chemotherapy with CMF 2.  09/14/2020:Left mastectomy ([name]): invasive and in situ ductal carcinoma, grade 2, 15cm, clear margins, with 14/14 lymph nodes positive for metastatic carcinoma with extracapsular extension.  ER 30%, PR 10%, HER-2 negative, Ki-67 15% 3.  Adjuvant radiation 10/28/2020-12/17/2020 4.  Followed by adjuvant antiestrogen therapy with anastrozole started 01/11/2021 5.  12/07/2021: Liver metastases: Biopsy consistent with metastatic breast cancer ER 0%, PR 0%, HER2 1+ 6.  Capecitabine 1000 mg p.o. twice daily 2 weeks on 1 week off started 01/11/2022-10/31/2022.  Progression ----------------------------------------------------------------------------------------------------------------------- Current treatment: Trodelvy started 11/20/2022, today cycle 3 Toxicities: Severe fatigue Fogginess in the head: IV cefazolin dose was reduced Constipation   Return to clinic to see me for cycle 4 and we will perform scans prior to that visit

## 2023-01-10 ENCOUNTER — Encounter: Payer: Self-pay | Admitting: Hematology and Oncology

## 2023-01-10 ENCOUNTER — Telehealth: Payer: Self-pay | Admitting: Licensed Clinical Social Worker

## 2023-01-10 NOTE — Telephone Encounter (Signed)
[place] Work  Clinical Social Work was referred by  palliative care  for advanced directives.  Clinical Social Worker attempted to contact patient by phone  to schedule for clinic.    No answer. Left VM with direct contact information.     [place], [place] Worker [employer]

## 2023-01-11 ENCOUNTER — Telehealth: Payer: Self-pay | Admitting: Licensed Clinical Social Worker

## 2023-01-11 NOTE — Telephone Encounter (Signed)
Attempted to contact patient again regarding scheduling for Advanced directives clinic. No answer. Left VM with direct contact information.   Nyra Anspaugh E Sumit Branham, LCSW

## 2023-01-15 MED FILL — Fosaprepitant Dimeglumine For IV Infusion 150 MG (Base Eq): INTRAVENOUS | Qty: 5 | Status: AC

## 2023-01-15 MED FILL — Dexamethasone Sodium Phosphate Inj 100 MG/10ML: INTRAMUSCULAR | Qty: 1 | Status: AC

## 2023-01-16 ENCOUNTER — Encounter: Payer: Self-pay | Admitting: Neurology

## 2023-01-16 ENCOUNTER — Other Ambulatory Visit: Payer: Self-pay

## 2023-01-16 ENCOUNTER — Inpatient Hospital Stay: Payer: Medicare HMO | Attending: Hematology and Oncology

## 2023-01-16 ENCOUNTER — Ambulatory Visit (INDEPENDENT_AMBULATORY_CARE_PROVIDER_SITE_OTHER): Payer: Medicare HMO | Admitting: Neurology

## 2023-01-16 ENCOUNTER — Inpatient Hospital Stay: Payer: Medicare HMO

## 2023-01-16 VITALS — BP 172/90 | HR 79 | Temp 98.3°F | Resp 12 | Wt 140.0 lb

## 2023-01-16 VITALS — BP 173/91 | HR 86 | Ht 62.0 in | Wt 140.5 lb

## 2023-01-16 DIAGNOSIS — C787 Secondary malignant neoplasm of liver and intrahepatic bile duct: Secondary | ICD-10-CM | POA: Diagnosis not present

## 2023-01-16 DIAGNOSIS — Z170 Estrogen receptor positive status [ER+]: Secondary | ICD-10-CM

## 2023-01-16 DIAGNOSIS — Z5181 Encounter for therapeutic drug level monitoring: Secondary | ICD-10-CM | POA: Diagnosis not present

## 2023-01-16 DIAGNOSIS — Z95828 Presence of other vascular implants and grafts: Secondary | ICD-10-CM

## 2023-01-16 DIAGNOSIS — F17210 Nicotine dependence, cigarettes, uncomplicated: Secondary | ICD-10-CM | POA: Insufficient documentation

## 2023-01-16 DIAGNOSIS — G40909 Epilepsy, unspecified, not intractable, without status epilepticus: Secondary | ICD-10-CM

## 2023-01-16 DIAGNOSIS — C50412 Malignant neoplasm of upper-outer quadrant of left female breast: Secondary | ICD-10-CM | POA: Diagnosis present

## 2023-01-16 DIAGNOSIS — Z5112 Encounter for antineoplastic immunotherapy: Secondary | ICD-10-CM | POA: Diagnosis present

## 2023-01-16 LAB — CBC WITH DIFFERENTIAL (CANCER CENTER ONLY)
Abs Immature Granulocytes: 0.09 10*3/uL — ABNORMAL HIGH (ref 0.00–0.07)
Basophils Absolute: 0 10*3/uL (ref 0.0–0.1)
Basophils Relative: 1 %
Eosinophils Absolute: 0.1 10*3/uL (ref 0.0–0.5)
Eosinophils Relative: 1 %
HCT: 34.8 % — ABNORMAL LOW (ref 36.0–46.0)
Hemoglobin: 11.9 g/dL — ABNORMAL LOW (ref 12.0–15.0)
Immature Granulocytes: 2 %
Lymphocytes Relative: 13 %
Lymphs Abs: 0.7 10*3/uL (ref 0.7–4.0)
MCH: 30.4 pg (ref 26.0–34.0)
MCHC: 34.2 g/dL (ref 30.0–36.0)
MCV: 88.8 fL (ref 80.0–100.0)
Monocytes Absolute: 0.6 10*3/uL (ref 0.1–1.0)
Monocytes Relative: 11 %
Neutro Abs: 4 10*3/uL (ref 1.7–7.7)
Neutrophils Relative %: 72 %
Platelet Count: 187 10*3/uL (ref 150–400)
RBC: 3.92 MIL/uL (ref 3.87–5.11)
RDW: 15.4 % (ref 11.5–15.5)
WBC Count: 5.4 10*3/uL (ref 4.0–10.5)
nRBC: 0 % (ref 0.0–0.2)

## 2023-01-16 LAB — PHOSPHORUS: Phosphorus: 5 mg/dL — ABNORMAL HIGH (ref 2.5–4.6)

## 2023-01-16 LAB — CMP (CANCER CENTER ONLY)
ALT: 7 U/L (ref 0–44)
AST: 12 U/L — ABNORMAL LOW (ref 15–41)
Albumin: 3.7 g/dL (ref 3.5–5.0)
Alkaline Phosphatase: 84 U/L (ref 38–126)
Anion gap: 8 (ref 5–15)
BUN: 13 mg/dL (ref 8–23)
CO2: 26 mmol/L (ref 22–32)
Calcium: 9.2 mg/dL (ref 8.9–10.3)
Chloride: 105 mmol/L (ref 98–111)
Creatinine: 0.82 mg/dL (ref 0.44–1.00)
GFR, Estimated: >60 mL/min (ref >60)
Glucose, Bld: 202 mg/dL — ABNORMAL HIGH (ref 70–99)
Potassium: 3.5 mmol/L (ref 3.5–5.1)
Sodium: 139 mmol/L (ref 135–145)
Total Bilirubin: 0.4 mg/dL (ref 0.3–1.2)
Total Protein: 6.4 g/dL — ABNORMAL LOW (ref 6.5–8.1)

## 2023-01-16 LAB — MAGNESIUM: Magnesium: 1.6 mg/dL — ABNORMAL LOW (ref 1.7–2.4)

## 2023-01-16 MED ORDER — SODIUM CHLORIDE 0.9 % IV SOLN: Freq: Once | INTRAVENOUS | Status: AC

## 2023-01-16 MED ORDER — FAMOTIDINE IN NACL 20-0.9 MG/50ML-% IV SOLN
20.0000 mg | Freq: Once | INTRAVENOUS | Status: AC
  Administered 2023-01-16: 20 mg via INTRAVENOUS
  Filled 2023-01-16: qty 50

## 2023-01-16 MED ORDER — SODIUM CHLORIDE 0.9 % IV SOLN
10.0000 mg | Freq: Once | INTRAVENOUS | Status: AC
  Administered 2023-01-16: 10 mg via INTRAVENOUS
  Filled 2023-01-16: qty 10

## 2023-01-16 MED ORDER — ACETAMINOPHEN 325 MG PO TABS
650.0000 mg | ORAL_TABLET | Freq: Once | ORAL | Status: AC
  Administered 2023-01-16: 650 mg via ORAL
  Filled 2023-01-16: qty 2

## 2023-01-16 MED ORDER — SODIUM CHLORIDE 0.9% FLUSH
10.0000 mL | INTRAVENOUS | Status: DC | PRN
  Administered 2023-01-16: 10 mL

## 2023-01-16 MED ORDER — CETIRIZINE HCL 10 MG/ML IV SOLN
10.0000 mg | Freq: Once | INTRAVENOUS | Status: AC
  Administered 2023-01-16: 10 mg via INTRAVENOUS
  Filled 2023-01-16: qty 1

## 2023-01-16 MED ORDER — SODIUM CHLORIDE 0.9% FLUSH
10.0000 mL | Freq: Once | INTRAVENOUS | Status: AC
  Administered 2023-01-16: 10 mL

## 2023-01-16 MED ORDER — PALONOSETRON HCL INJECTION 0.25 MG/5ML
0.2500 mg | Freq: Once | INTRAVENOUS | Status: AC
  Administered 2023-01-16: 0.25 mg via INTRAVENOUS
  Filled 2023-01-16: qty 5

## 2023-01-16 MED ORDER — LEVETIRACETAM 500 MG PO TABS: 500.0000 mg | ORAL_TABLET | Freq: Two times a day (BID) | ORAL | 11 refills | Status: DC

## 2023-01-16 MED ORDER — HEPARIN SOD (PORK) LOCK FLUSH 100 UNIT/ML IV SOLN
500.0000 [IU] | Freq: Once | INTRAVENOUS | Status: AC | PRN
  Administered 2023-01-16: 500 [IU]

## 2023-01-16 MED ORDER — SODIUM CHLORIDE 0.9 % IV SOLN
150.0000 mg | Freq: Once | INTRAVENOUS | Status: AC
  Administered 2023-01-16: 150 mg via INTRAVENOUS
  Filled 2023-01-16: qty 150

## 2023-01-16 MED ORDER — SODIUM CHLORIDE 0.9 % IV SOLN
6.0000 mg/kg | Freq: Once | INTRAVENOUS | Status: AC
  Administered 2023-01-16: 360 mg via INTRAVENOUS
  Filled 2023-01-16: qty 36

## 2023-01-16 NOTE — Patient Instructions (Addendum)
Continue with Keppra 500 mg twice daily Will check a Keppra level today  Routine EEG Continue follow-up with your doctors Please contact me if you have a breakthrough seizure Follow-up in 6 months or sooner if worse

## 2023-01-16 NOTE — Progress Notes (Signed)
Ok to infuse [place] over 1.5 hr per [place], [facility]

## 2023-01-16 NOTE — Progress Notes (Signed)
[facility]  PATIENT: Colleen Lowery DOB: Feb 01, 1941  REQUESTING CLINICIAN: Drenda Freeze, MD HISTORY FROM: Patient  REASON FOR VISIT: Seizure disorder    HISTORICAL  CHIEF COMPLAINT:  Chief Complaint  Patient presents with   New Patient (Initial Visit)    Rm 13. Patient reports of purell (hand sanitizer) being a trigger for seizures. Patient report just coming from hospital for treatment    HISTORY OF PRESENT ILLNESS:  This is a 82 year old woman past medical history of breast cancer undergoing chemotherapy, hypertension, hyperlipidemia, diabetes and mellitus, who is presenting after a seizure.  Patient reports she had a last seizure on February 6.  This was after getting chemotherapy as she was going home she had a witnessed generalized tonic clonic seizure in her parking lot. She lost consciousness and have a full generalized tonic-clonic seizure.  She was taken to the hospital. She recall waking up in the hospital. This was her second seizure in a week.  Her other seizure was on January 29 while she was getting chemotherapy.  Due to her second seizure, patient was started on Keppra. Head CT was done, did not show any brain metastasis or any bleed. She was started on Keppra 500 mg twice daily.  Since taking the Keppra, she denies any seizure or seizure activity.  She reports compliance with the medicine. Off note she report back in 2021 she also had seizures and was put on Keppra.  She did well and the medication was discontinued.   Handedness: Right handed   Onset: January 2023  Seizure Type: Generalized convulsion,   Current frequency: Last one Feb 6  Any injuries from seizures: Denies   Seizure risk factors: Nonre reported   Previous ASMs: Levetiracetam   Currenty ASMs: Levetiracetam   ASMs side effects: Denies   Brain Images: no acute abnormalities (CT head 12/11/22)   Previous EEGs: None done    OTHER MEDICAL CONDITIONS: Breast cancer,  hypertension, hyperlipidemia, diabetes mellitus, seizures   REVIEW OF SYSTEMS: Full 14 system review of systems performed and negative with exception of: as noted in the HPI   ALLERGIES: Allergies  Allergen Reactions   Alcohol Anaphylaxis and Swelling    Purell hand sanitizer  Swelling in the luNGS, CHEST AND THROAT and seizures Problem with smell not the Alcohol Other reaction(s): Other (See Comments) Causes seizures and swelling   Contrast Media [Iodinated Contrast Media] Anaphylaxis    Throat closed, was 40 yrs ago  *Iodine    Iodine Anaphylaxis    Throat closed, was 40 yrs ago   Atorvastatin     Myalgia    Dust Mite Extract Swelling    SWELLING IS GI SYSTEM   Latex     QUESTIONABLE INTOLERABLE MUCOUS OOZING OUT OF THROAT   Lisinopril Other (See Comments)    Pt does not remember reaction    Molds & Smuts Swelling    SWELLING IS IN GI SYSTEM   Wound Dressing Adhesive Other (See Comments)    Causes skin to tear    HOME MEDICATIONS: Outpatient Medications Prior to Visit  Medication Sig Dispense Refill   alendronate (FOSAMAX) 70 MG tablet Take 1 tablet (70 mg total) by mouth once a week. Take with a full glass of water on an empty stomach. 12 tablet 3   Ascorbic Acid (VITAMIN C PO) Take 2,000 mg by mouth daily.     B Complex Vitamins (VITAMIN B COMPLEX) TABS Take 2 tablets by mouth daily.  B Complex-Folic Acid (B COMPLEX PLUS PO) Take 4 tablets by mouth daily.     Beta Carotene (VITAMIN A) 25000 UNIT capsule Take 50,000 Units by mouth daily.     CALCIUM PO Take 10 mLs by mouth daily. Liquid 300 MG /5 ML     Cholecalciferol (VITAMIN D3) 5000 units CAPS Take 5,000 Units by mouth daily. 2500 units per cap     diazepam (VALIUM) 2 MG tablet SMARTSIG:1-2 Tablet(s) By Mouth As Directed     Flaxseed, Linseed, (FLAXSEED OIL) 1000 MG CAPS Take 3,000 mg by mouth daily. Taking as needed     furosemide (LASIX) 20 MG tablet Take 20 mg by mouth daily.     Homeopathic Products  (ARNICARE PAIN RELIEF EX) Apply 1 application  topically daily as needed (pain).     lidocaine-prilocaine (EMLA) cream Apply to affected area once 30 g 3   liothyronine (CYTOMEL) 25 MCG tablet Take 25 mcg by mouth daily.     loperamide (IMODIUM) 2 MG capsule Take 2 tabs by mouth with first loose stool, then 1 tab with each additional loose stool as needed. Do not exceed 8 tabs in a 24-hour period 30 capsule 0   losartan (COZAAR) 50 MG tablet Take 50 mg by mouth daily.     MAGNESIUM PO Take 5 mLs by mouth daily. Liquid 2.5 ml/150 mg     Misc Natural Products (BLOOD SUGAR BALANCE PO) Take 3 capsules by mouth daily. Blood Sugar Manager     ondansetron (ZOFRAN) 8 MG tablet Take 1 tablet (8 mg total) by mouth every 8 (eight) hours as needed for nausea or vomiting. Take 1 tablets ('8mg'$  total) by mouth every 8hrs as needed. Start on the third day after chemotherapy. 30 tablet 1   OVER THE COUNTER MEDICATION Take 1 Scoop by mouth daily. Branched chain amino acids 4000  Patient states "taking for muscles"     OVER THE COUNTER MEDICATION Take 400-800 mg by mouth daily as needed (Based on Blood glucose). gymnema sylvestre Patient takes for "lowering blood glucose"     OVER THE COUNTER MEDICATION Take 5 capsules by mouth daily. Pau d' arco 500 mg each  Patient states now taking liquid and "keeps mold down"     OVER THE COUNTER MEDICATION Take 6 capsules by mouth daily. Caprylic acid Patient taking to "lower candida risk"     OVER THE COUNTER MEDICATION Take 5 capsules by mouth daily. Pancreatic enzymes Patient taking "to get fluid out"     OVER THE COUNTER MEDICATION Take 3 capsules by mouth daily. Raw Adrenal Patient taking for "overall energy"     OVER THE COUNTER MEDICATION Take 6 capsules by mouth daily. Beef Pancreas Patient taking for "pancreatic function improvement and lymphatic system improvement"     PANTETHINE PO Take 1-2 tablets by mouth daily as needed (Itching). Vitamin B5     potassium  chloride SA (K-DUR,KLOR-CON) 20 MEQ tablet Take 20 mEq by mouth 2 (two) times daily.     predniSONE (DELTASONE) 50 MG tablet Take 1 tab 13, 7, and 1 hour prior to CT scan.  Take benadryl '25mg'$  oral 1 hour prior to CT scan 3 tablet 0   prochlorperazine (COMPAZINE) 10 MG tablet Take 1 tablet (10 mg total) by mouth every 6 (six) hours as needed for nausea or vomiting. 30 tablet 1   PROLENSA 0.07 % SOLN Place 1 drop into the right eye 4 (four) times daily.     Propylene Glycol (SYSTANE BALANCE)  0.6 % SOLN Place 2 drops into the right eye in the morning and at bedtime.     TRUE METRIX BLOOD GLUCOSE TEST test strip SMARTSIG:Via Meter     TRUEplus Lancets 33G MISC      levETIRAcetam (KEPPRA) 500 MG tablet Take 1 tablet (500 mg total) by mouth 2 (two) times daily. 60 tablet 0   sodium chloride flush (NS) 0.9 % injection 10 mL      No facility-administered medications prior to visit.    PAST MEDICAL HISTORY: Past Medical History:  Diagnosis Date   AICD (automatic cardioverter/defibrillator) present    Allergy    Arthritis    FINGERS   Cancer ([place])    left breast   Cataract    right   Crohn disease ([place])    Depression    Diabetes mellitus without complication ([place])    TYPE 2 , TAKING OTC MED FORGYMEMA   GERD (gastroesophageal reflux disease)    Headache    sinus   Heart failure, acute, systolic and diastolic ([place]) MARCH 0000000 DX   History of colonic polyps    Hypertension    Hypothyroidism    per patient, takes OTC meds when temperature drops low   IBS (irritable bowel syndrome)    Metastases to the liver ([place]) 11/20/2022   Seizures ([place])    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, 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   Thyroid disease    AGE 38'S UNTIL  2016 OFF ALL THRYOID MEDS NOW    Wears dentures    Wears glasses     PAST SURGICAL HISTORY: Past Surgical History:  Procedure Laterality Date   ABDOMINAL HYSTERECTOMY     PARTIAL   BREAST BIOPSY Left 04/01/2019   INVASIVE DUCTAL CARCINOMA   BREAST SURGERY      CARDIAC CATHETERIZATION  02/10/2016   Procedure: Right/Left Heart Cath and Coronary Angiography;  Surgeon: Leonie Man, MD;  Location: [facility];  Service: Cardiovascular;;   CATARACT EXTRACTION W/ INTRAOCULAR LENS IMPLANT     left   COLONOSCOPY WITH PROPOFOL N/A 02/16/2017   Procedure: COLONOSCOPY WITH PROPOFOL;  Surgeon: Carol Ada, MD;  Location: [facility];  Service: Endoscopy;  Laterality: N/A;   COLONOSCOPY WITH PROPOFOL N/A 08/12/2021   Procedure: COLONOSCOPY WITH PROPOFOL;  Surgeon: Carol Ada, MD;  Location: [facility];  Service: Endoscopy;  Laterality: N/A;   EP IMPLANTABLE DEVICE N/A 02/10/2016   Procedure: ICD Implant;  Surgeon: Will Meredith Leeds, MD;  Location: [facility];  Service: Cardiovascular;  Laterality: N/A;   ESOPHAGOGASTRODUODENOSCOPY (EGD) WITH PROPOFOL N/A 02/16/2017   Procedure: ESOPHAGOGASTRODUODENOSCOPY (EGD) WITH PROPOFOL;  Surgeon: Carol Ada, MD;  Location: [facility];  Service: Endoscopy;  Laterality: N/A;  reflux   IR IMAGING GUIDED PORT INSERTION  04/27/2020   IR IMAGING GUIDED PORT INSERTION  11/17/2022   MASTECTOMY Left    MASTECTOMY MODIFIED RADICAL Left 09/14/2020   Procedure: LEFT MASTECTOMY MODIFIED RADICAL;  Surgeon: Erroll Luna, MD;  Location: [facility];  Service: General;  Laterality: Left;  PEC BLOCK   POLYPECTOMY  08/12/2021   Procedure: POLYPECTOMY;  Surgeon: Carol Ada, MD;  Location[facility];  Service: Endoscopy;;   TONSILLECTOMY      FAMILY HISTORY: Family History  Problem Relation Age of Onset   Diabetes Mother    Stroke Mother    Diabetes Brother    Diabetes Maternal Grandmother    Diabetes Paternal Grandfather    Heart failure Paternal Grandfather    Hypertension Brother  Heart attack Neg Hx     SOCIAL HISTORY: Social History   Socioeconomic History   Marital status: Widowed    Spouse name: Not on file   Number of children: Not on file   Years of education: Not on file   Highest  education level: Not on file  Occupational History   Not on file  Tobacco Use   Smoking status: Every Day    Packs/day: 0.25    Years: 50.00    Total pack years: 12.50    Types: Cigarettes   Smokeless tobacco: Never   Tobacco comments:    3 puffs of each cigarette  Vaping Use   Vaping Use: Never used  Substance and Sexual Activity   Alcohol use: No   Drug use: No   Sexual activity: Not Currently  Other Topics Concern   Not on file  Social History Narrative   Not on file   Social Determinants of Health   Financial Resource Strain: Not on file  Food Insecurity: Not on file  Transportation Needs: Not on file  Physical Activity: Not on file  Stress: Not on file  Social Connections: Not on file  Intimate Partner Violence: Not on file     PHYSICAL EXAM   GENERAL EXAM/CONSTITUTIONAL: Vitals:  Vitals:   01/16/23 1426  BP: (!) 173/91  Pulse: 86  Weight: 140 lb 8 oz (63.7 kg)  Height: '5\' 2"'$  (1.575 m)   Body mass index is 25.7 kg/m. Wt Readings from Last 3 Encounters:  01/16/23 140 lb 8 oz (63.7 kg)  01/16/23 140 lb (63.5 kg)  01/09/23 135 lb 12.8 oz (61.6 kg)   Patient is in no distress; well developed, nourished and groomed; neck is supple  EYES: Visual fields full to confrontation, Extraocular movements intacts,  No results found.  MUSCULOSKELETAL: Gait, strength, tone, movements noted in Neurologic exam below  NEUROLOGIC: MENTAL STATUS:      No data to display         awake, alert, oriented to person, place and time recent and remote memory intact normal attention and concentration language fluent, comprehension intact, naming intact fund of knowledge appropriate  CRANIAL NERVE:  2nd, 3rd, 4th, 6th - Visual fields full to confrontation, extraocular muscles intact, no nystagmus 5th - facial sensation symmetric 7th - facial strength symmetric 8th - hearing intact 9th - palate elevates symmetrically, uvula midline 11th - shoulder shrug  symmetric 12th - tongue protrusion midline  MOTOR:  normal bulk and tone, full strength in the BUE, BLE  SENSORY:  normal and symmetric to light touch  COORDINATION:  finger-nose-finger, fine finger movements normal  REFLEXES:  deep tendon reflexes present and symmetric  GAIT/STATION:  normal     DIAGNOSTIC DATA (LABS, IMAGING, TESTING) - I reviewed patient records, labs, notes, testing and imaging myself where available.  Lab Results  Component Value Date   WBC 5.4 01/16/2023   HGB 11.9 (L) 01/16/2023   HCT 34.8 (L) 01/16/2023   MCV 88.8 01/16/2023   PLT 187 01/16/2023      Component Value Date/Time   NA 139 01/16/2023 0829   NA 141 06/20/2012 1055   K 3.5 01/16/2023 0829   K 3.6 06/20/2012 1055   CL 105 01/16/2023 0829   CL 109 (H) 06/20/2012 1055   CO2 26 01/16/2023 0829   CO2 25 06/20/2012 1055   GLUCOSE 202 (H) 01/16/2023 0829   GLUCOSE 173 (H) 06/20/2012 1055   BUN 13 01/16/2023 0829   BUN  12 06/20/2012 1055   CREATININE 0.82 01/16/2023 0829   CREATININE 0.92 05/09/2016 1053   CALCIUM 9.2 01/16/2023 0829   CALCIUM 9.0 06/20/2012 1055   PROT 6.4 (L) 01/16/2023 0829   ALBUMIN 3.7 01/16/2023 0829   AST 12 (L) 01/16/2023 0829   ALT 7 01/16/2023 0829   ALKPHOS 84 01/16/2023 0829   BILITOT 0.4 01/16/2023 0829   GFRNONAA >60 01/16/2023 0829   GFRNONAA >60 06/20/2012 1055   GFRAA >60 08/02/2020 0853   GFRAA >60 06/20/2012 1055   No results found for: "CHOL", "HDL", "LDLCALC", "LDLDIRECT", "TRIG" Lab Results  Component Value Date   HGBA1C 6.6 (H) 09/06/2020   No results found for: "VITAMINB12" Lab Results  Component Value Date   TSH 0.184 (L) 02/07/2016    Head CT 12/11/2022 1. No acute intracranial process. 2. Right maxillary sinus disease  I personally reviewed brain Images.   ASSESSMENT AND PLAN  82 y.o. year old female  with history of breast cancer with metastasis to the liver, hypertension, hyperlipidemia, diabetes, seizure disorder  who is presenting to establish care for her seizure disorder.  Patient has 2 seizures within a week, first 1 on January 29 at the chemotherapy center and the second 1 on February 6 at her home.  Both seizures were described as generalized convulsion.  Since her last seizure in February 6 she was restarted on Keppra 500 mg twice daily, she tolerates the medication very well, denies any side effect and denies any seizures.  At the moment I will continue Keppra 500 mg twice daily, will also obtain a Keppra level.  Advised patient to contact me if she has any seizure or seizure activity.  She does report that her seizures are triggered by the smell of Purell.  Continue to follow-up with your doctors and return sooner if worse   1. Seizure disorder ([place])   2. Therapeutic drug monitoring     Patient Instructions  Continue with Keppra 500 mg twice daily Will check a Keppra level today  Routine EEG Continue follow-up with your doctors Please contact me if you have a breakthrough seizure Follow-up in 6 months or sooner if worse   Per [facility] statutes, patients with seizures are not allowed to drive until they have been seizure-free for six months.  Other recommendations include using caution when using heavy equipment or power tools. Avoid working on ladders or at heights. Take showers instead of baths.  Do not swim alone.  Ensure the water temperature is not too high on the home water heater. Do not go swimming alone. Do not lock yourself in a room alone (i.e. bathroom). When caring for infants or small children, sit down when holding, feeding, or changing them to minimize risk of injury to the child in the event you have a seizure. Maintain good sleep hygiene. Avoid alcohol.  Also recommend adequate sleep, hydration, good diet and minimize stress.   During the Seizure  - First, ensure adequate ventilation and place patients on the floor on their left side  Loosen clothing around the neck  and ensure the airway is patent. If the patient is clenching the teeth, do not force the mouth open with any object as this can cause severe damage - Remove all items from the surrounding that can be hazardous. The patient may be oblivious to what's happening and may not even know what he or she is doing. If the patient is confused and wandering, either gently guide him/her away and  block access to outside areas - Reassure the individual and be comforting - Call 911. In most cases, the seizure ends before EMS arrives. However, there are cases when seizures may last over 3 to 5 minutes. Or the individual may have developed breathing difficulties or severe injuries. If a pregnant patient or a person with diabetes develops a seizure, it is prudent to call an ambulance. - Finally, if the patient does not regain full consciousness, then call EMS. Most patients will remain confused for about 45 to 90 minutes after a seizure, so you must use judgment in calling for help. - Avoid restraints but make sure the patient is in a bed with padded side rails - Place the individual in a lateral position with the neck slightly flexed; this will help the saliva drain from the mouth and prevent the tongue from falling backward - Remove all nearby furniture and other hazards from the area - Provide verbal assurance as the individual is regaining consciousness - Provide the patient with privacy if possible - Call for help and start treatment as ordered by the caregiver   After the Seizure (Postictal Stage)  After a seizure, most patients experience confusion, fatigue, muscle pain and/or a headache. Thus, one should permit the individual to sleep. For the next few days, reassurance is essential. Being calm and helping reorient the person is also of importance.  Most seizures are painless and end spontaneously. Seizures are not harmful to others but can lead to complications such as stress on the lungs, brain and the  heart. Individuals with prior lung problems may develop labored breathing and respiratory distress.     Orders Placed This Encounter  Procedures   Levetiracetam level   EEG adult    Meds ordered this encounter  Medications   levETIRAcetam (KEPPRA) 500 MG tablet    Sig: Take 1 tablet (500 mg total) by mouth 2 (two) times daily.    Dispense:  60 tablet    Refill:  11    Return in about 6 months (around 07/19/2023).    Alric Ran, MD 01/16/2023, 8:43 PM  [facility] [address], [place], [place] [address] [phone]

## 2023-01-16 NOTE — Patient Instructions (Signed)

## 2023-01-16 NOTE — Patient Instructions (Signed)
[facility]  Discharge Instructions: Thank you for choosing [facility] to provide your oncology and hematology care.   If you have a lab appointment with the [facility], please go directly to the [facility] and check in at the registration area.   Wear comfortable clothing and clothing appropriate for easy access to any Portacath or PICC line.   We strive to give you quality time with your provider. You may need to reschedule your appointment if you arrive late (15 or more minutes).  Arriving late affects you and other patients whose appointments are after yours.  Also, if you miss three or more appointments without notifying the office, you may be dismissed from the clinic at the provider's discretion.      For prescription refill requests, have your pharmacy contact our office and allow 72 hours for refills to be completed.    Today you received the following chemotherapy and/or immunotherapy agents: Colleen Lowery      To help prevent nausea and vomiting after your treatment, we encourage you to take your nausea medication as directed.  BELOW ARE SYMPTOMS THAT SHOULD BE REPORTED IMMEDIATELY: *FEVER GREATER THAN 100.4 F (38 C) OR HIGHER *CHILLS OR SWEATING *NAUSEA AND VOMITING THAT IS NOT CONTROLLED WITH YOUR NAUSEA MEDICATION *UNUSUAL SHORTNESS OF BREATH *UNUSUAL BRUISING OR BLEEDING *URINARY PROBLEMS (pain or burning when urinating, or frequent urination) *BOWEL PROBLEMS (unusual diarrhea, constipation, pain near the anus) TENDERNESS IN MOUTH AND THROAT WITH OR WITHOUT PRESENCE OF ULCERS (sore throat, sores in mouth, or a toothache) UNUSUAL RASH, SWELLING OR PAIN  UNUSUAL VAGINAL DISCHARGE OR ITCHING   Items with * indicate a potential emergency and should be followed up as soon as possible or go to the Emergency Department if any problems should occur.  Please show the CHEMOTHERAPY ALERT CARD or IMMUNOTHERAPY ALERT CARD at  check-in to the Emergency Department and triage nurse.  Should you have questions after your visit or need to cancel or reschedule your appointment, please contact [facility]  Dept: [phone]  and follow the prompts.  Office hours are 8:00 a.m. to 4:30 p.m. Monday - Friday. Please note that voicemails left after 4:00 p.m. may not be returned until the following business day.  We are closed weekends and major holidays. You have access to a nurse at all times for urgent questions. Please call the main number to the clinic Dept: [phone] and follow the prompts.   For any non-urgent questions, you may also contact your provider using MyChart. We now offer e-Visits for anyone 47 and older to request care online for non-urgent symptoms. For details visit [email or web address].   Also download the MyChart app! Go to the app store, search "MyChart", open the app, select [facility], and log in with your MyChart username and password.

## 2023-01-16 NOTE — Progress Notes (Signed)
Patient remained for 30 min post obs.  VSS at discharge.  Tolerated treatment well without incident.  Wheelchair to lobby.

## 2023-01-16 NOTE — Progress Notes (Signed)
OK to proceed with treatment with Mg 1.6 and Phosphorus pending per Dr. Lindi Adie.

## 2023-01-17 ENCOUNTER — Other Ambulatory Visit: Payer: Medicare HMO

## 2023-01-17 LAB — LEVETIRACETAM LEVEL: Levetiracetam Lvl: 11.5 ug/mL (ref 10.0–40.0)

## 2023-01-19 ENCOUNTER — Ambulatory Visit (HOSPITAL_COMMUNITY)
Admission: RE | Admit: 2023-01-19 | Discharge: 2023-01-19 | Disposition: A | Discharge status: Home / Self Care | Payer: Medicare HMO | Source: Ambulatory Visit | Attending: Hematology and Oncology | Admitting: Hematology and Oncology

## 2023-01-19 DIAGNOSIS — C787 Secondary malignant neoplasm of liver and intrahepatic bile duct: Secondary | ICD-10-CM | POA: Insufficient documentation

## 2023-01-19 DIAGNOSIS — Z170 Estrogen receptor positive status [ER+]: Secondary | ICD-10-CM | POA: Diagnosis present

## 2023-01-19 DIAGNOSIS — C50412 Malignant neoplasm of upper-outer quadrant of left female breast: Secondary | ICD-10-CM | POA: Insufficient documentation

## 2023-01-19 MED ORDER — SODIUM CHLORIDE (PF) 0.9 % IJ SOLN
INTRAMUSCULAR | Status: AC
  Filled 2023-01-19: qty 50

## 2023-01-19 MED ORDER — IOHEXOL 300 MG/ML  SOLN
100.0000 mL | Freq: Once | INTRAMUSCULAR | Status: AC | PRN
  Administered 2023-01-19: 100 mL via INTRAVENOUS

## 2023-01-22 ENCOUNTER — Inpatient Hospital Stay: Payer: Medicare HMO | Admitting: Licensed Clinical Social Worker

## 2023-01-22 ENCOUNTER — Other Ambulatory Visit: Payer: Self-pay

## 2023-01-22 DIAGNOSIS — Z170 Estrogen receptor positive status [ER+]: Secondary | ICD-10-CM

## 2023-01-22 NOTE — Progress Notes (Unsigned)
[place]  [phone] [phone]) [phone]   Name: Colleen Lowery Date: 01/22/2023 MRN: [id number]  DOB: [birth year]  Patient Care Team: [name], MD as PCP - General (Family Medicine) Kyung Rudd, MD as Consulting Physician (Radiation Oncology) Nicholas Lose, MD as Consulting Physician (Hematology and Oncology) Erroll Luna, MD as Consulting Physician (General Surgery)    INTERVAL HISTORY: Colleen Lowery is a 82 y.o. 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 Past medical history also includes seizures, diabetes, and HTN. Palliative ask to see for symptom management and goals of care.   SOCIAL HISTORY:     reports that she has been smoking cigarettes. She has a 12.50 pack-year smoking history. She has never used smokeless tobacco. She reports that she does not drink alcohol and does not use drugs.  ADVANCE DIRECTIVES:  DNR/MOST on file  CODE STATUS: DNR  PAST MEDICAL HISTORY: Past Medical History:  Diagnosis Date   AICD (automatic cardioverter/defibrillator) present    Allergy    Arthritis    FINGERS   Cancer ([place])    left breast   Cataract    right   Crohn disease ([place])    Depression    Diabetes mellitus without complication ([place])    TYPE 2 , TAKING OTC MED FORGYMEMA   GERD (gastroesophageal reflux disease)    Headache    sinus   Heart failure, acute, systolic and diastolic ([place]) MARCH 0000000 DX   History of colonic polyps    Hypertension    Hypothyroidism    per patient, takes OTC meds when temperature drops low   IBS (irritable bowel syndrome)    Metastases to the liver ([place]) 11/20/2022   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  MEDICATIONS:  Current Outpatient Medications  Medication Sig Dispense Refill   alendronate (FOSAMAX) 70 MG tablet Take 1 tablet (70 mg total) by mouth once a week. Take with a full glass of water on an empty stomach. 12 tablet 3   Ascorbic Acid (VITAMIN C PO) Take 2,000 mg by mouth daily.     B Complex Vitamins (VITAMIN B COMPLEX) TABS Take 2 tablets by mouth daily.     B Complex-Folic Acid (B COMPLEX PLUS PO) Take 4 tablets by mouth daily.     Beta Carotene (VITAMIN A) 25000 UNIT capsule Take 50,000 Units by mouth daily.     CALCIUM PO Take 10 mLs by mouth daily. Liquid 300 MG /5 ML     Cholecalciferol (VITAMIN D3) 5000 units CAPS Take 5,000 Units by mouth daily. 2500 units per cap     diazepam (VALIUM) 2 MG tablet SMARTSIG:1-2 Tablet(s) By Mouth As Directed     Flaxseed, Linseed, (FLAXSEED OIL) 1000 MG CAPS Take 3,000 mg by mouth daily. Taking as needed     furosemide (LASIX) 20 MG tablet Take 20 mg by mouth daily.     Homeopathic Products (ARNICARE PAIN RELIEF EX) Apply 1 application  topically daily as needed (pain).  levETIRAcetam (KEPPRA) 500 MG tablet Take 1 tablet (500 mg total) by mouth 2 (two) times daily. 60 tablet 11   lidocaine-prilocaine (EMLA) cream Apply to affected area once 30 g 3   liothyronine (CYTOMEL) 25 MCG tablet Take 25 mcg by mouth daily.     loperamide (IMODIUM) 2 MG capsule Take 2 tabs by mouth with first loose stool, then 1 tab with each additional loose stool as needed. Do not exceed 8 tabs in a 24-hour period 30 capsule 0   losartan (COZAAR) 50 MG tablet Take 50 mg by mouth daily.     MAGNESIUM PO Take 5 mLs by mouth daily. Liquid 2.5 ml/150 mg     Misc Natural Products (BLOOD SUGAR BALANCE PO) Take 3 capsules by mouth daily. Blood Sugar Manager     ondansetron (ZOFRAN) 8  MG tablet Take 1 tablet (8 mg total) by mouth every 8 (eight) hours as needed for nausea or vomiting. Take 1 tablets ('8mg'$  total) by mouth every 8hrs as needed. Start on the third day after chemotherapy. 30 tablet 1   OVER THE COUNTER MEDICATION Take 1 Scoop by mouth daily. Branched chain amino acids 4000  Patient states "taking for muscles"     OVER THE COUNTER MEDICATION Take 400-800 mg by mouth daily as needed (Based on Blood glucose). gymnema sylvestre Patient takes for "lowering blood glucose"     OVER THE COUNTER MEDICATION Take 5 capsules by mouth daily. Pau d' arco 500 mg each  Patient states now taking liquid and "keeps mold down"     OVER THE COUNTER MEDICATION Take 6 capsules by mouth daily. Caprylic acid Patient taking to "lower candida risk"     OVER THE COUNTER MEDICATION Take 5 capsules by mouth daily. Pancreatic enzymes Patient taking "to get fluid out"     OVER THE COUNTER MEDICATION Take 3 capsules by mouth daily. Raw Adrenal Patient taking for "overall energy"     OVER THE COUNTER MEDICATION Take 6 capsules by mouth daily. Beef Pancreas Patient taking for "pancreatic function improvement and lymphatic system improvement"     PANTETHINE PO Take 1-2 tablets by mouth daily as needed (Itching). Vitamin B5     potassium chloride SA (K-DUR,KLOR-CON) 20 MEQ tablet Take 20 mEq by mouth 2 (two) times daily.     predniSONE (DELTASONE) 50 MG tablet Take 1 tab 13, 7, and 1 hour prior to CT scan.  Take benadryl '25mg'$  oral 1 hour prior to CT scan 3 tablet 0   prochlorperazine (COMPAZINE) 10 MG tablet Take 1 tablet (10 mg total) by mouth every 6 (six) hours as needed for nausea or vomiting. 30 tablet 1   PROLENSA 0.07 % SOLN Place 1 drop into the right eye 4 (four) times daily.     Propylene Glycol (SYSTANE BALANCE) 0.6 % SOLN Place 2 drops into the right eye in the morning and at bedtime.     TRUE METRIX BLOOD GLUCOSE TEST test strip SMARTSIG:Via Meter     TRUEplus Lancets 33G MISC       No current facility-administered medications for this visit.    VITAL SIGNS: There were no vitals taken for this visit. There were no vitals filed for this visit.  Estimated body mass index is 25.7 kg/m as calculated from the following:   Height as of 01/16/23: '5\' 2"'$  (1.575 m).   Weight as of 01/16/23: 140 lb 8 oz (63.7 kg).   PERFORMANCE STATUS (ECOG) : {CHL ONC ECOG [id number]   Physical  Exam General: NAD Cardiovascular: regular rate and rhythm Pulmonary: clear ant fields Abdomen: soft, nontender, + bowel sounds GU: no suprapubic tenderness Extremities: no edema, no joint deformities Skin: no rashes Neurological:   IMPRESSION: *** Anxiety: Ms.Gaunce reports anxiety with having to go out in public because she is afraid of coming into contact with strong odors that might trigger more seizures. Reports Valium was recently prescribed, she had trouble obtaining from pharmacy initially, but now able to pick-up and plans to utilize soon.   2. Dyspnea:    3. Pain: Denies pain related to her cancer. Endorses some abdominal fullness and states that she knows she has a "big liver." Reports resting well at night. States she stays up late, but has always done that and prefers to do so.   4. Appetite:     5. Goals of Care  01/09/23-Ms. Trew is clear in expressed wishes to continue to treat the thre treatable allowing her every opportunity to continue to thrive. Taking life one day at a time.    I empathetically approached discussions regarding code status and advanced directives. She does not have a documented advanced directives. Her brother is an Forensic psychologist and have mentioned completing however she has not been able to do so. Advance directive education is provided and patient verbalizes understanding. Kaylii expressed wishes to completed documents at the AD clinic. Understands a referral will be placed. Plans to attend advance directive clinic to complete HCPOA paperwork once  scheduled. Packet provided to review and begin completing with understanding of what sections she is able to appropriately complete prior to appointment. She would want two people involved in her care in the event she were unable to speak for herself: 1) Hartford Poli, brother 2) Barnett Applebaum "Benedetto Coons, sister.   We discussed her full code status. Patient is clear in her expressed wishes that she would NOT want to be resuscitated in the event of cardiac/pulmonary arrest. Education provided on DNR. Patient again confirms wishes requesting appropriate documents to have in her home. DNR and MOST form are completed at this visit. Original copies given to patient.  We discussed Her current illness and what it means in the larger context of Her on-going co-morbidities. Natural disease trajectory and expectations were discussed.  I discussed the importance of continued conversation with family and their medical providers regarding overall plan of care and treatment options, ensuring decisions are within the context of the patients values and GOCs.  PLAN: Established therapeutic relationship. Education provided on palliative's role in collaboration with their Oncology/Radiation team. Monitor tolerance and efficacy of newly prescribed Keppra. Monitor tolerance and efficacy of newly prescribed Valium. Encourage reporting of any symptoms that may arise. Assist with schedule of Advance Care Planning clinic. CSW referral placed. Education provided along with packet.  DNR as requested and confirmed by patient. Out of facility form completed and patient given original copy. Scanned into [employer]. MOST form completed. See above. Original copy given to patient and scanned into [employer].  Palliative will plan to see patient back in 2-4 weeks in collaboration to other oncology appointments.    Patient expressed understanding and was in agreement with this plan. She also understands that She can call the clinic at any  time with any questions, concerns, or complaints.   Any controlled substances utilized were prescribed in the context of palliative care. PDMP has been reviewed.   Time Total: ***  Visit consisted of counseling and education dealing with the complex and emotionally intense issues of symptom  management and palliative care in the setting of serious and potentially life-threatening illness.Greater than 50%  of this time was spent counseling and coordinating care related to the above assessment and plan.  Alda Lea, AGPCNP-BC  Palliative Medicine Team/[facility]  *Please note that this is a verbal dictation therefore any spelling or grammatical errors are due to the "[employer] [employer]" system interpretation.

## 2023-01-22 NOTE — Progress Notes (Signed)
[place] Social Work  Patient presented to [facility]  to review and complete healthcare advance directives.  Clinical Social Worker met with patient.  The patient designated their brother Hartford Poli [phone]) as their primary healthcare agent and their son Esra Barks [phone]) as their secondary agent.  Patient also completed healthcare living will.    Documents were notarized and copies made for patient/family. Clinical Social Worker will send documents to medical records to be scanned into patient's chart. Clinical Social Worker encouraged patient/family to contact with any additional questions or concerns.   Henriette Combs, [place] Worker [facility]

## 2023-01-23 ENCOUNTER — Other Ambulatory Visit: Payer: Medicare HMO | Admitting: *Deleted

## 2023-01-23 ENCOUNTER — Ambulatory Visit: Payer: Medicare HMO

## 2023-01-23 ENCOUNTER — Inpatient Hospital Stay (HOSPITAL_BASED_OUTPATIENT_CLINIC_OR_DEPARTMENT_OTHER): Payer: Medicare HMO | Admitting: Nurse Practitioner

## 2023-01-23 ENCOUNTER — Encounter: Payer: Self-pay | Admitting: Nurse Practitioner

## 2023-01-23 ENCOUNTER — Ambulatory Visit: Payer: Medicare HMO | Admitting: Hematology and Oncology

## 2023-01-23 ENCOUNTER — Other Ambulatory Visit: Payer: Medicare HMO

## 2023-01-23 VITALS — BP 162/86 | HR 89 | Temp 97.7°F | Ht 62.0 in | Wt 139.6 lb

## 2023-01-23 DIAGNOSIS — Z515 Encounter for palliative care: Secondary | ICD-10-CM | POA: Diagnosis not present

## 2023-01-23 DIAGNOSIS — Z7189 Other specified counseling: Secondary | ICD-10-CM

## 2023-01-23 DIAGNOSIS — F419 Anxiety disorder, unspecified: Secondary | ICD-10-CM

## 2023-01-29 MED FILL — Fosaprepitant Dimeglumine For IV Infusion 150 MG (Base Eq): INTRAVENOUS | Qty: 5 | Status: AC

## 2023-01-29 MED FILL — Dexamethasone Sodium Phosphate Inj 100 MG/10ML: INTRAMUSCULAR | Qty: 1 | Status: AC

## 2023-01-30 ENCOUNTER — Inpatient Hospital Stay: Payer: Medicare HMO

## 2023-01-30 ENCOUNTER — Other Ambulatory Visit: Payer: Medicare HMO

## 2023-01-30 ENCOUNTER — Inpatient Hospital Stay (HOSPITAL_BASED_OUTPATIENT_CLINIC_OR_DEPARTMENT_OTHER): Payer: Medicare HMO | Admitting: Adult Health

## 2023-01-30 ENCOUNTER — Encounter: Payer: Self-pay | Admitting: Adult Health

## 2023-01-30 VITALS — BP 168/94 | HR 89 | Temp 97.9°F | Resp 16 | Ht 62.0 in | Wt 139.8 lb

## 2023-01-30 DIAGNOSIS — Z170 Estrogen receptor positive status [ER+]: Secondary | ICD-10-CM

## 2023-01-30 DIAGNOSIS — Z5112 Encounter for antineoplastic immunotherapy: Secondary | ICD-10-CM | POA: Diagnosis not present

## 2023-01-30 DIAGNOSIS — C50412 Malignant neoplasm of upper-outer quadrant of left female breast: Secondary | ICD-10-CM | POA: Diagnosis not present

## 2023-01-30 DIAGNOSIS — Z95828 Presence of other vascular implants and grafts: Secondary | ICD-10-CM

## 2023-01-30 LAB — CMP (CANCER CENTER ONLY)
ALT: 6 U/L (ref 0–44)
AST: 12 U/L — ABNORMAL LOW (ref 15–41)
Albumin: 3.8 g/dL (ref 3.5–5.0)
Alkaline Phosphatase: 84 U/L (ref 38–126)
Anion gap: 6 (ref 5–15)
BUN: 11 mg/dL (ref 8–23)
CO2: 26 mmol/L (ref 22–32)
Calcium: 9 mg/dL (ref 8.9–10.3)
Chloride: 107 mmol/L (ref 98–111)
Creatinine: 0.77 mg/dL (ref 0.44–1.00)
GFR, Estimated: >60 mL/min (ref >60)
Glucose, Bld: 157 mg/dL — ABNORMAL HIGH (ref 70–99)
Potassium: 3.7 mmol/L (ref 3.5–5.1)
Sodium: 139 mmol/L (ref 135–145)
Total Bilirubin: 0.4 mg/dL (ref 0.3–1.2)
Total Protein: 6.5 g/dL (ref 6.5–8.1)

## 2023-01-30 LAB — CBC WITH DIFFERENTIAL (CANCER CENTER ONLY)
Abs Immature Granulocytes: 0.02 10*3/uL (ref 0.00–0.07)
Basophils Absolute: 0 10*3/uL (ref 0.0–0.1)
Basophils Relative: 1 %
Eosinophils Absolute: 0 10*3/uL (ref 0.0–0.5)
Eosinophils Relative: 1 %
HCT: 37.2 % (ref 36.0–46.0)
Hemoglobin: 12.4 g/dL (ref 12.0–15.0)
Immature Granulocytes: 1 %
Lymphocytes Relative: 21 %
Lymphs Abs: 0.8 10*3/uL (ref 0.7–4.0)
MCH: 30.2 pg (ref 26.0–34.0)
MCHC: 33.3 g/dL (ref 30.0–36.0)
MCV: 90.5 fL (ref 80.0–100.0)
Monocytes Absolute: 0.5 10*3/uL (ref 0.1–1.0)
Monocytes Relative: 12 %
Neutro Abs: 2.7 10*3/uL (ref 1.7–7.7)
Neutrophils Relative %: 64 %
Platelet Count: 210 10*3/uL (ref 150–400)
RBC: 4.11 MIL/uL (ref 3.87–5.11)
RDW: 15.2 % (ref 11.5–15.5)
WBC Count: 4 10*3/uL (ref 4.0–10.5)
nRBC: 0 % (ref 0.0–0.2)

## 2023-01-30 LAB — MAGNESIUM: Magnesium: 1.8 mg/dL (ref 1.7–2.4)

## 2023-01-30 LAB — PHOSPHORUS: Phosphorus: 3.8 mg/dL (ref 2.5–4.6)

## 2023-01-30 MED ORDER — ATROPINE SULFATE 1 MG/ML IV SOLN
0.5000 mg | Freq: Once | INTRAVENOUS | Status: DC | PRN
  Filled 2023-01-30: qty 1

## 2023-01-30 MED ORDER — HEPARIN SOD (PORK) LOCK FLUSH 100 UNIT/ML IV SOLN
500.0000 [IU] | Freq: Once | INTRAVENOUS | Status: AC | PRN
  Administered 2023-01-30: 500 [IU]

## 2023-01-30 MED ORDER — SODIUM CHLORIDE 0.9 % IV SOLN
150.0000 mg | Freq: Once | INTRAVENOUS | Status: AC
  Administered 2023-01-30: 150 mg via INTRAVENOUS
  Filled 2023-01-30: qty 150

## 2023-01-30 MED ORDER — SODIUM CHLORIDE 0.9 % IV SOLN
6.0000 mg/kg | Freq: Once | INTRAVENOUS | Status: AC
  Administered 2023-01-30: 360 mg via INTRAVENOUS
  Filled 2023-01-30: qty 36

## 2023-01-30 MED ORDER — PALONOSETRON HCL INJECTION 0.25 MG/5ML
0.2500 mg | Freq: Once | INTRAVENOUS | Status: AC
  Administered 2023-01-30: 0.25 mg via INTRAVENOUS
  Filled 2023-01-30: qty 5

## 2023-01-30 MED ORDER — SODIUM CHLORIDE 0.9 % IV SOLN: Freq: Once | INTRAVENOUS | Status: AC

## 2023-01-30 MED ORDER — SODIUM CHLORIDE 0.9 % IV SOLN
10.0000 mg | Freq: Once | INTRAVENOUS | Status: AC
  Administered 2023-01-30: 10 mg via INTRAVENOUS
  Filled 2023-01-30: qty 10

## 2023-01-30 MED ORDER — FAMOTIDINE IN NACL 20-0.9 MG/50ML-% IV SOLN
20.0000 mg | Freq: Once | INTRAVENOUS | Status: AC
  Administered 2023-01-30: 20 mg via INTRAVENOUS
  Filled 2023-01-30: qty 50

## 2023-01-30 MED ORDER — SODIUM CHLORIDE 0.9% FLUSH
10.0000 mL | INTRAVENOUS | Status: DC | PRN
  Administered 2023-01-30: 10 mL

## 2023-01-30 MED ORDER — ACETAMINOPHEN 325 MG PO TABS
650.0000 mg | ORAL_TABLET | Freq: Once | ORAL | Status: AC
  Administered 2023-01-30: 650 mg via ORAL
  Filled 2023-01-30: qty 2

## 2023-01-30 MED ORDER — SODIUM CHLORIDE 0.9% FLUSH
10.0000 mL | Freq: Once | INTRAVENOUS | Status: AC
  Administered 2023-01-30: 10 mL

## 2023-01-30 MED ORDER — CETIRIZINE HCL 10 MG/ML IV SOLN
10.0000 mg | Freq: Once | INTRAVENOUS | Status: AC
  Administered 2023-01-30: 10 mg via INTRAVENOUS
  Filled 2023-01-30: qty 1

## 2023-01-30 NOTE — Patient Instructions (Signed)
[facility]  Discharge Instructions: Thank you for choosing [facility] to provide your oncology and hematology care.   If you have a lab appointment with the Cancer Center, please go directly to the Cancer Center and check in at the registration area.   Wear comfortable clothing and clothing appropriate for easy access to any Portacath or PICC line.   We strive to give you quality time with your provider. You may need to reschedule your appointment if you arrive late (15 or more minutes).  Arriving late affects you and other patients whose appointments are after yours.  Also, if you miss three or more appointments without notifying the office, you may be dismissed from the clinic at the provider's discretion.      For prescription refill requests, have your pharmacy contact our office and allow 72 hours for refills to be completed.    Today you received the following chemotherapy and/or immunotherapy agents: Trodelvy      To help prevent nausea and vomiting after your treatment, we encourage you to take your nausea medication as directed.  BELOW ARE SYMPTOMS THAT SHOULD BE REPORTED IMMEDIATELY: *FEVER GREATER THAN 100.4 F (38 C) OR HIGHER *CHILLS OR SWEATING *NAUSEA AND VOMITING THAT IS NOT CONTROLLED WITH YOUR NAUSEA MEDICATION *UNUSUAL SHORTNESS OF BREATH *UNUSUAL BRUISING OR BLEEDING *URINARY PROBLEMS (pain or burning when urinating, or frequent urination) *BOWEL PROBLEMS (unusual diarrhea, constipation, pain near the anus) TENDERNESS IN MOUTH AND THROAT WITH OR WITHOUT PRESENCE OF ULCERS (sore throat, sores in mouth, or a toothache) UNUSUAL RASH, SWELLING OR PAIN  UNUSUAL VAGINAL DISCHARGE OR ITCHING   Items with * indicate a potential emergency and should be followed up as soon as possible or go to the Emergency Department if any problems should occur.  Please show the CHEMOTHERAPY ALERT CARD or IMMUNOTHERAPY ALERT CARD at  check-in to the Emergency Department and triage nurse.  Should you have questions after your visit or need to cancel or reschedule your appointment, please contact [facility]  Dept: [phone]  and follow the prompts.  Office hours are 8:00 a.m. to 4:30 p.m. Monday - Friday. Please note that voicemails left after 4:00 p.m. may not be returned until the following business day.  We are closed weekends and major holidays. You have access to a nurse at all times for urgent questions. Please call the main number to the clinic Dept: [phone] and follow the prompts.   For any non-urgent questions, you may also contact your provider using MyChart. We now offer e-Visits for anyone 18 and older to request care online for non-urgent symptoms. For details visit [email or web address].   Also download the MyChart app! Go to the app store, search "MyChart", open the app, select [place], and log in with your MyChart username and password.   

## 2023-01-30 NOTE — Assessment & Plan Note (Signed)
Colleen Lowery is a 82 year old woman with metastatic breast cancer currently on treatment with [place].  Most recent restaging: March 8 showing slight decrease and no progression of breast cancer  Seizure activity: She is following with her PCP for this and takes Valium before her visits.  She is tolerating treatment well.  Her labs are stable and we will see her back in 1 week for labs and day 8 of treatment and in 3 weeks for her next cycle.

## 2023-01-30 NOTE — Progress Notes (Signed)
[facility] Follow up:    Colleen Jude, MD [place] [id number]   DIAGNOSIS:  Cancer Staging  Malignant neoplasm of upper-outer quadrant of left breast in female, estrogen receptor positive ([place]) Staging form: Breast, AJCC 8th Edition - Clinical stage from 04/07/2020: Stage IIIB (cT4b, cN1, cM0, G2, ER+, PR+, HER2-) - Signed by Colleen Lose, MD on 04/07/2020 Stage prefix: Initial diagnosis Histologic grading system: 3 grade system - Pathologic stage from 09/14/2020: No Stage Recommended (ypT3, pN3a, cM0, G2, ER+, PR+, HER2-) - Unsigned Stage prefix: Post-therapy Histologic grading system: 3 grade system - Pathologic: Stage IV (pM1) - Signed by Colleen Phlegm, NP on 11/20/2022   SUMMARY OF ONCOLOGIC HISTORY: Oncology History  Malignant neoplasm of upper-outer quadrant of left breast in female, estrogen receptor positive ([place])  03/22/2020 Initial Diagnosis   Diffuse left breast swelling. skin thickening and two areas of irregular hypoechogenicity in the 2-3 o'clock region, 4.9cm, and at least 4 abnormal lymph nodes  with cortical thickening. 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    10/28/2020 - 12/17/2020 Radiation Therapy   The patient initially received a dose of 50.4 Gy in 28 fractions to the left breast and supraclavicular region using whole-breast tangent fields. This was delivered using a 3-D conformal technique. The pt received a boost  delivering an additional 10 Gy in 5 fractions using a electron boost with 36meV electrons. The total dose was 60.4 Gy.   11/2020 - 11/2027 Anti-estrogen oral therapy   Anastrozole   01/30/2022 Miscellaneous   Guardant360:ERBB2 (exon 20 insertion): Benefit from Enhertu, MSI high not detected   11/20/2022 Cancer Staging   Staging form: Breast, AJCC 8th Edition - Pathologic: Stage IV (pM1) - Signed by Colleen Phlegm, NP on 11/20/2022   11/20/2022 -  Chemotherapy   Patient is on Treatment Plan : BREAST METASTATIC Sacituzumab govitecan-hziy Colleen Lowery) D1,8 q21d       CURRENT THERAPY: Colleen Lowery  INTERVAL HISTORY: Colleen Lowery 82 y.o. female returns for follow-up and evaluation of her metastatic breast cancer currently on treatment with Colleen Lowery given on days 1 and 8 of a 21-day cycle.    Her most recent CT chest abdomen and pelvis was completed on January 20, 2023 demonstrating a slight interval decrease in the hypodense metastasis in the central liver dome, no new liver lesions, slight interval decrease in size of enlarged portacaval lymph node no other enlarged lymph nodes noted in the chest abdomen or pelvis.  No acute osseous findings were noted.  No other signs of malignancy noted.  Colleen Lowery tells me that she is doing well today.  She is not having any issues today.  She does take 1/2 tablet of Valium before she comes due to seizures that she relates to the send of [place] that she is allergic to.   Patient Active Problem List   Diagnosis Date Noted   Metastases to the liver ([place]) 11/20/2022   Acid reflux 02/09/2021   Acute gastritis  02/09/2021   Port-A-Cath in place 05/31/2020   Tobacco use 04/16/2020   Malignant neoplasm of upper-outer quadrant of left breast in female, estrogen receptor positive ([place]) 04/07/2020   Hyperlipidemia 10/11/2017   Presence of automatic (implantable) cardiac defibrillator 10/10/2016   Peripheral arterial disease ([place]) 06/14/2016   Upper airway cough syndrome  06/08/2016   CAD in native artery 05/31/2016   Chronic combined systolic and diastolic congestive heart failure ([place]) 05/31/2016   VT (ventricular tachycardia) ([place]) 02/09/2016   Acute on chronic combined systolic and diastolic HF (heart failure) ([place]) 02/09/2016   Contrast media allergy 02/09/2016   Dyspnea 02/07/2016   Benign essential HTN 02/07/2016   Cigarette smoker 02/07/2016   Diabetes ([place]) 08/02/2014   Essential hypertension 08/02/2014    is allergic to alcohol, contrast media [iodinated contrast media], iodine, atorvastatin, dust mite extract, latex, lisinopril, molds & smuts, and wound dressing adhesive.  MEDICAL HISTORY: Past Medical History:  Diagnosis Date   AICD (automatic cardioverter/defibrillator) present    Allergy    Arthritis    FINGERS   Cancer ([place])    left breast   Cataract    right   Crohn disease ([place])    Depression    Diabetes mellitus without complication ([place])    TYPE 2 , TAKING OTC MED FORGYMEMA   GERD (gastroesophageal reflux disease)    Headache    sinus   Heart failure, acute, systolic and diastolic ([place]) MARCH 0000000 DX   History of colonic polyps    Hypertension    Hypothyroidism    per patient, takes OTC meds when temperature drops low   IBS (irritable bowel syndrome)    Metastases to the liver ([place]) 11/20/2022   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, 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   Thyroid disease    AGE 40'S UNTIL  2016 OFF ALL THRYOID MEDS NOW    Wears dentures    Wears glasses     SURGICAL HISTORY: Past Surgical History:  Procedure Laterality Date   ABDOMINAL HYSTERECTOMY     PARTIAL   BREAST BIOPSY Left 04/01/2019   INVASIVE DUCTAL CARCINOMA   BREAST SURGERY     CARDIAC CATHETERIZATION  02/10/2016   Procedure: Right/Left Heart Cath and Coronary Angiography;  Surgeon: Leonie Man, MD;  Location: [facility];  Service: Cardiovascular;;   CATARACT EXTRACTION W/ INTRAOCULAR LENS IMPLANT     left   COLONOSCOPY WITH  PROPOFOL N/A 02/16/2017   Procedure: COLONOSCOPY WITH PROPOFOL;  Surgeon: Carol Ada, MD;  Location: [facility];  Service: Endoscopy;  Laterality: N/A;   COLONOSCOPY WITH PROPOFOL N/A 08/12/2021   Procedure: COLONOSCOPY WITH PROPOFOL;  Surgeon: Carol Ada, MD;  Location: [facility];  Service: Endoscopy;  Laterality: N/A;   EP IMPLANTABLE DEVICE N/A 02/10/2016   Procedure: ICD Implant;  Surgeon: Will Meredith Leeds, MD;  Location: [facility];  Service: Cardiovascular;  Laterality: N/A;   ESOPHAGOGASTRODUODENOSCOPY (EGD) WITH PROPOFOL N/A 02/16/2017   Procedure: ESOPHAGOGASTRODUODENOSCOPY (EGD) WITH PROPOFOL;  Surgeon: Carol Ada, MD;  Location: [facility];  Service: Endoscopy;  Laterality: N/A;  reflux   IR IMAGING GUIDED PORT INSERTION  04/27/2020   IR IMAGING GUIDED PORT INSERTION  11/17/2022   MASTECTOMY Left    MASTECTOMY MODIFIED RADICAL Left 09/14/2020   Procedure: LEFT MASTECTOMY MODIFIED RADICAL;  Surgeon: Erroll Luna, MD;  Location: [facility];  Service: General;  Laterality: Left;  PEC BLOCK   POLYPECTOMY  08/12/2021   Procedure: POLYPECTOMY;  Surgeon:  Carol Ada, MD;  Location: [facility];  Service: Endoscopy;;   TONSILLECTOMY      SOCIAL HISTORY: Social History   Socioeconomic History   Marital status: Widowed    Spouse name: Not on file   Number of children: Not on file   Years of education: Not on file   Highest education level: Not on file  Occupational History   Not on file  Tobacco Use   Smoking status: Every Day    Packs/day: 0.25    Years: 50.00    Additional pack years: 0.00    Total pack years: 12.50    Types: Cigarettes   Smokeless tobacco: Never   Tobacco comments:    3 puffs of each cigarette  Vaping Use   Vaping Use: Never used  Substance and Sexual Activity   Alcohol use: No   Drug use: No   Sexual activity: Not Currently  Other Topics Concern   Not on file  Social History Narrative   Not on file   Social Determinants of  Health   Financial Resource Strain: Not on file  Food Insecurity: Not on file  Transportation Needs: Not on file  Physical Activity: Not on file  Stress: Not on file  Social Connections: Not on file  Intimate Partner Violence: Not on file    FAMILY HISTORY: Family History  Problem Relation Age of Onset   Diabetes Mother    Stroke Mother    Diabetes Brother    Diabetes Maternal Grandmother    Diabetes Paternal Grandfather    Heart failure Paternal Grandfather    Hypertension Brother    Heart attack Neg Hx     Review of Systems  Constitutional:  Negative for appetite change, chills, fatigue, fever and unexpected weight change.  HENT:   Negative for hearing loss, lump/mass and trouble swallowing.   Eyes:  Negative for eye problems and icterus.  Respiratory:  Negative for chest tightness, cough and shortness of breath.   Cardiovascular:  Negative for chest pain, leg swelling and palpitations.  Gastrointestinal:  Negative for abdominal distention, abdominal pain, constipation, diarrhea, nausea and vomiting.  Endocrine: Negative for hot flashes.  Genitourinary:  Negative for difficulty urinating.   Musculoskeletal:  Negative for arthralgias.  Skin:  Negative for itching and rash.  Neurological:  Negative for dizziness, extremity weakness, headaches and numbness.  Hematological:  Negative for adenopathy. Does not bruise/bleed easily.  Psychiatric/Behavioral:  Negative for depression. The patient is not nervous/anxious.       PHYSICAL EXAMINATION  ECOG PERFORMANCE STATUS: 1 - Symptomatic but completely ambulatory  Vitals:   01/30/23 1209  BP: (!) 168/94  Pulse: 89  Resp: 16  Temp: 97.9 F (36.6 C)  SpO2: 100%    Physical Exam Constitutional:      General: She is not in acute distress.    Appearance: Normal appearance. She is not toxic-appearing.  HENT:     Head: Normocephalic and atraumatic.  Eyes:     General: No scleral icterus. Cardiovascular:     Rate and  Rhythm: Normal rate and regular rhythm.     Pulses: Normal pulses.     Heart sounds: Normal heart sounds.  Pulmonary:     Effort: Pulmonary effort is normal.     Breath sounds: Normal breath sounds.  Abdominal:     General: Abdomen is flat. Bowel sounds are normal. There is no distension.     Palpations: Abdomen is soft.     Tenderness: There is no abdominal  tenderness.  Musculoskeletal:        General: No swelling.     Cervical back: Neck supple.  Lymphadenopathy:     Cervical: No cervical adenopathy.  Skin:    General: Skin is warm and dry.     Findings: No rash.  Neurological:     General: No focal deficit present.     Mental Status: She is alert.  Psychiatric:        Mood and Affect: Mood normal.        Behavior: Behavior normal.     LABORATORY DATA:  CBC    Component Value Date/Time   WBC 4.0 01/30/2023 1145   WBC 4.1 12/19/2022 1828   RBC 4.11 01/30/2023 1145   HGB 12.4 01/30/2023 1145   HGB 13.9 06/20/2012 1055   HCT 37.2 01/30/2023 1145   HCT 40.1 06/20/2012 1055   PLT 210 01/30/2023 1145   PLT 308 06/20/2012 1055   MCV 90.5 01/30/2023 1145   MCV 93 06/20/2012 1055   MCH 30.2 01/30/2023 1145   MCHC 33.3 01/30/2023 1145   RDW 15.2 01/30/2023 1145   RDW 14.3 06/20/2012 1055   LYMPHSABS 0.8 01/30/2023 1145   LYMPHSABS 2.1 06/20/2012 1055   MONOABS 0.5 01/30/2023 1145   MONOABS 0.6 06/20/2012 1055   EOSABS 0.0 01/30/2023 1145   EOSABS 0.0 06/20/2012 1055   BASOSABS 0.0 01/30/2023 1145   BASOSABS 0.1 06/20/2012 1055    CMP     Component Value Date/Time   NA 139 01/30/2023 1145   NA 141 06/20/2012 1055   K 3.7 01/30/2023 1145   K 3.6 06/20/2012 1055   CL 107 01/30/2023 1145   CL 109 (H) 06/20/2012 1055   CO2 26 01/30/2023 1145   CO2 25 06/20/2012 1055   GLUCOSE 157 (H) 01/30/2023 1145   GLUCOSE 173 (H) 06/20/2012 1055   BUN 11 01/30/2023 1145   BUN 12 06/20/2012 1055   CREATININE 0.77 01/30/2023 1145   CREATININE 0.92 05/09/2016 1053    CALCIUM 9.0 01/30/2023 1145   CALCIUM 9.0 06/20/2012 1055   PROT 6.5 01/30/2023 1145   ALBUMIN 3.8 01/30/2023 1145   AST 12 (L) 01/30/2023 1145   ALT 6 01/30/2023 1145   ALKPHOS 84 01/30/2023 1145   BILITOT 0.4 01/30/2023 1145   GFRNONAA >60 01/30/2023 1145   GFRNONAA >60 06/20/2012 1055   GFRAA >60 08/02/2020 0853   GFRAA >60 06/20/2012 1055     ASSESSMENT and THERAPY PLAN:   Malignant neoplasm of upper-outer quadrant of left breast in female, estrogen receptor positive ([place]) Colleen Lowery is a 82 year old woman with metastatic breast cancer currently on treatment with [place].  Most recent restaging: March 8 showing slight decrease and no progression of breast cancer  Seizure activity: She is following with her PCP for this and takes Valium before her visits.  She is tolerating treatment well.  Her labs are stable and we will see her back in 1 week for labs and day 8 of treatment and in 3 weeks for her next cycle.      All questions were answered. The patient knows to call the clinic with any problems, questions or concerns. We can certainly see the patient much sooner if necessary.  Total encounter time:30 minutes*in face-to-face visit time, chart review, lab review, care coordination, order entry, and documentation of the encounter time.    Wilber Bihari, NP 01/30/23 2:27 PM Medical Oncology and Hematology [facility] [place], [place]  [place] Tel. [phone]    Fax. [phone]  *Total Encounter Time as defined by the Centers for Medicare and Medicaid Services includes, in addition to the face-to-face time of a patient visit (documented in the note above) non-face-to-face time: obtaining and reviewing outside history, ordering and reviewing medications, tests or procedures, care coordination (communications with other health care professionals or caregivers) and documentation in the medical record.

## 2023-02-01 ENCOUNTER — Ambulatory Visit: Payer: Medicare HMO | Admitting: Neurology

## 2023-02-01 ENCOUNTER — Encounter: Payer: Self-pay | Admitting: Hematology and Oncology

## 2023-02-01 DIAGNOSIS — G40909 Epilepsy, unspecified, not intractable, without status epilepticus: Secondary | ICD-10-CM

## 2023-02-01 DIAGNOSIS — Z5181 Encounter for therapeutic drug level monitoring: Secondary | ICD-10-CM

## 2023-02-01 NOTE — Procedures (Signed)
    History:  82 year old woman with seizure  EEG classification: Awake and drowsy  Description of the recording: The background rhythms of this recording consists of a fairly well modulated medium amplitude alpha rhythm of 9 Hz that is reactive to eye opening and closure. Present in the anterior head region is a 15-20 Hz beta activity. Photic stimulation was performed, did not show any abnormalities. Hyperventilation was also performed, did not show any abnormalities. Drowsiness was manifested by background fragmentation. No abnormal epileptiform discharges seen during this recording. There was no focal slowing. There were no electrographic seizure identified.   Abnormality: None   Impression: This is a normal EEG recorded while drowsy and awake. No evidence of interictal epileptiform discharges. Normal EEGs, however, do not rule out epilepsy.    Alric Ran, MD [facility]

## 2023-02-02 ENCOUNTER — Telehealth: Payer: Self-pay | Admitting: Hematology and Oncology

## 2023-02-02 NOTE — Telephone Encounter (Signed)
Called patient per 3/22 secure chat to confirm tx appointment times/dates. Patient notified.

## 2023-02-05 MED FILL — Dexamethasone Sodium Phosphate Inj 100 MG/10ML: INTRAMUSCULAR | Qty: 1 | Status: AC

## 2023-02-05 MED FILL — Fosaprepitant Dimeglumine For IV Infusion 150 MG (Base Eq): INTRAVENOUS | Qty: 5 | Status: AC

## 2023-02-06 ENCOUNTER — Inpatient Hospital Stay: Payer: Medicare HMO

## 2023-02-06 VITALS — BP 173/92 | HR 92 | Temp 98.0°F | Resp 17 | Wt 138.0 lb

## 2023-02-06 DIAGNOSIS — Z5112 Encounter for antineoplastic immunotherapy: Secondary | ICD-10-CM | POA: Diagnosis not present

## 2023-02-06 DIAGNOSIS — Z170 Estrogen receptor positive status [ER+]: Secondary | ICD-10-CM

## 2023-02-06 DIAGNOSIS — Z95828 Presence of other vascular implants and grafts: Secondary | ICD-10-CM

## 2023-02-06 LAB — CBC WITH DIFFERENTIAL (CANCER CENTER ONLY)
Abs Immature Granulocytes: 0.1 10*3/uL — ABNORMAL HIGH (ref 0.00–0.07)
Basophils Absolute: 0 10*3/uL (ref 0.0–0.1)
Basophils Relative: 1 %
Eosinophils Absolute: 0.1 10*3/uL (ref 0.0–0.5)
Eosinophils Relative: 2 %
HCT: 37.2 % (ref 36.0–46.0)
Hemoglobin: 12.4 g/dL (ref 12.0–15.0)
Immature Granulocytes: 2 %
Lymphocytes Relative: 20 %
Lymphs Abs: 0.8 10*3/uL (ref 0.7–4.0)
MCH: 30.3 pg (ref 26.0–34.0)
MCHC: 33.3 g/dL (ref 30.0–36.0)
MCV: 91 fL (ref 80.0–100.0)
Monocytes Absolute: 0.5 10*3/uL (ref 0.1–1.0)
Monocytes Relative: 13 %
Neutro Abs: 2.6 10*3/uL (ref 1.7–7.7)
Neutrophils Relative %: 62 %
Platelet Count: 175 10*3/uL (ref 150–400)
RBC: 4.09 MIL/uL (ref 3.87–5.11)
RDW: 15.4 % (ref 11.5–15.5)
WBC Count: 4.1 10*3/uL (ref 4.0–10.5)
nRBC: 0 % (ref 0.0–0.2)

## 2023-02-06 LAB — PHOSPHORUS: Phosphorus: 4.2 mg/dL (ref 2.5–4.6)

## 2023-02-06 LAB — CMP (CANCER CENTER ONLY)
ALT: 7 U/L (ref 0–44)
AST: 11 U/L — ABNORMAL LOW (ref 15–41)
Albumin: 3.8 g/dL (ref 3.5–5.0)
Alkaline Phosphatase: 81 U/L (ref 38–126)
Anion gap: 6 (ref 5–15)
BUN: 16 mg/dL (ref 8–23)
CO2: 27 mmol/L (ref 22–32)
Calcium: 9.6 mg/dL (ref 8.9–10.3)
Chloride: 107 mmol/L (ref 98–111)
Creatinine: 0.79 mg/dL (ref 0.44–1.00)
GFR, Estimated: >60 mL/min (ref >60)
Glucose, Bld: 234 mg/dL — ABNORMAL HIGH (ref 70–99)
Potassium: 4.1 mmol/L (ref 3.5–5.1)
Sodium: 140 mmol/L (ref 135–145)
Total Bilirubin: 0.4 mg/dL (ref 0.3–1.2)
Total Protein: 6.3 g/dL — ABNORMAL LOW (ref 6.5–8.1)

## 2023-02-06 LAB — MAGNESIUM: Magnesium: 1.7 mg/dL (ref 1.7–2.4)

## 2023-02-06 MED ORDER — SODIUM CHLORIDE 0.9 % IV SOLN: Freq: Once | INTRAVENOUS | Status: AC

## 2023-02-06 MED ORDER — ACETAMINOPHEN 325 MG PO TABS
650.0000 mg | ORAL_TABLET | Freq: Once | ORAL | Status: AC
  Administered 2023-02-06: 650 mg via ORAL
  Filled 2023-02-06: qty 2

## 2023-02-06 MED ORDER — SODIUM CHLORIDE 0.9 % IV SOLN
10.0000 mg | Freq: Once | INTRAVENOUS | Status: AC
  Administered 2023-02-06: 10 mg via INTRAVENOUS
  Filled 2023-02-06: qty 10

## 2023-02-06 MED ORDER — SODIUM CHLORIDE 0.9 % IV SOLN
150.0000 mg | Freq: Once | INTRAVENOUS | Status: AC
  Administered 2023-02-06: 150 mg via INTRAVENOUS
  Filled 2023-02-06: qty 150

## 2023-02-06 MED ORDER — SODIUM CHLORIDE 0.9 % IV SOLN
6.0000 mg/kg | Freq: Once | INTRAVENOUS | Status: AC
  Administered 2023-02-06: 360 mg via INTRAVENOUS
  Filled 2023-02-06: qty 36

## 2023-02-06 MED ORDER — FAMOTIDINE IN NACL 20-0.9 MG/50ML-% IV SOLN
20.0000 mg | Freq: Once | INTRAVENOUS | Status: AC
  Administered 2023-02-06: 20 mg via INTRAVENOUS
  Filled 2023-02-06: qty 50

## 2023-02-06 MED ORDER — HEPARIN SOD (PORK) LOCK FLUSH 100 UNIT/ML IV SOLN
500.0000 [IU] | Freq: Once | INTRAVENOUS | Status: AC | PRN
  Administered 2023-02-06: 500 [IU]

## 2023-02-06 MED ORDER — PALONOSETRON HCL INJECTION 0.25 MG/5ML
0.2500 mg | Freq: Once | INTRAVENOUS | Status: AC
  Administered 2023-02-06: 0.25 mg via INTRAVENOUS
  Filled 2023-02-06: qty 5

## 2023-02-06 MED ORDER — SODIUM CHLORIDE 0.9% FLUSH
10.0000 mL | Freq: Once | INTRAVENOUS | Status: AC
  Administered 2023-02-06: 10 mL

## 2023-02-06 MED ORDER — CETIRIZINE HCL 10 MG/ML IV SOLN
10.0000 mg | Freq: Once | INTRAVENOUS | Status: AC
  Administered 2023-02-06: 10 mg via INTRAVENOUS
  Filled 2023-02-06: qty 1

## 2023-02-06 MED ORDER — SODIUM CHLORIDE 0.9% FLUSH
10.0000 mL | INTRAVENOUS | Status: DC | PRN
  Administered 2023-02-06: 10 mL

## 2023-02-06 NOTE — Patient Instructions (Signed)
[facility]  Discharge Instructions: Thank you for choosing [facility] to provide your oncology and hematology care.   If you have a lab appointment with the Cancer Center, please go directly to the Cancer Center and check in at the registration area.   Wear comfortable clothing and clothing appropriate for easy access to any Portacath or PICC line.   We strive to give you quality time with your provider. You may need to reschedule your appointment if you arrive late (15 or more minutes).  Arriving late affects you and other patients whose appointments are after yours.  Also, if you miss three or more appointments without notifying the office, you may be dismissed from the clinic at the provider's discretion.      For prescription refill requests, have your pharmacy contact our office and allow 72 hours for refills to be completed.    Today you received the following chemotherapy and/or immunotherapy agents: Trodelvy      To help prevent nausea and vomiting after your treatment, we encourage you to take your nausea medication as directed.  BELOW ARE SYMPTOMS THAT SHOULD BE REPORTED IMMEDIATELY: *FEVER GREATER THAN 100.4 F (38 C) OR HIGHER *CHILLS OR SWEATING *NAUSEA AND VOMITING THAT IS NOT CONTROLLED WITH YOUR NAUSEA MEDICATION *UNUSUAL SHORTNESS OF BREATH *UNUSUAL BRUISING OR BLEEDING *URINARY PROBLEMS (pain or burning when urinating, or frequent urination) *BOWEL PROBLEMS (unusual diarrhea, constipation, pain near the anus) TENDERNESS IN MOUTH AND THROAT WITH OR WITHOUT PRESENCE OF ULCERS (sore throat, sores in mouth, or a toothache) UNUSUAL RASH, SWELLING OR PAIN  UNUSUAL VAGINAL DISCHARGE OR ITCHING   Items with * indicate a potential emergency and should be followed up as soon as possible or go to the Emergency Department if any problems should occur.  Please show the CHEMOTHERAPY ALERT CARD or IMMUNOTHERAPY ALERT CARD at  check-in to the Emergency Department and triage nurse.  Should you have questions after your visit or need to cancel or reschedule your appointment, please contact [facility]  Dept: [phone]  and follow the prompts.  Office hours are 8:00 a.m. to 4:30 p.m. Monday - Friday. Please note that voicemails left after 4:00 p.m. may not be returned until the following business day.  We are closed weekends and major holidays. You have access to a nurse at all times for urgent questions. Please call the main number to the clinic Dept: [phone] and follow the prompts.   For any non-urgent questions, you may also contact your provider using MyChart. We now offer e-Visits for anyone 18 and older to request care online for non-urgent symptoms. For details visit [email or web address].   Also download the MyChart app! Go to the app store, search "MyChart", open the app, select [facility], and log in with your MyChart username and password.   

## 2023-02-16 ENCOUNTER — Other Ambulatory Visit: Payer: Self-pay | Admitting: *Deleted

## 2023-02-16 DIAGNOSIS — Z170 Estrogen receptor positive status [ER+]: Secondary | ICD-10-CM

## 2023-02-20 ENCOUNTER — Encounter: Payer: Self-pay | Admitting: Adult Health

## 2023-02-20 ENCOUNTER — Inpatient Hospital Stay: Payer: Medicare HMO | Attending: Hematology and Oncology

## 2023-02-20 ENCOUNTER — Inpatient Hospital Stay: Payer: Medicare HMO

## 2023-02-20 ENCOUNTER — Inpatient Hospital Stay (HOSPITAL_BASED_OUTPATIENT_CLINIC_OR_DEPARTMENT_OTHER): Payer: Medicare HMO | Admitting: Adult Health

## 2023-02-20 VITALS — BP 169/102 | HR 90 | Temp 97.8°F | Wt 140.9 lb

## 2023-02-20 VITALS — BP 162/94 | HR 96 | Resp 16

## 2023-02-20 DIAGNOSIS — Z170 Estrogen receptor positive status [ER+]: Secondary | ICD-10-CM | POA: Diagnosis not present

## 2023-02-20 DIAGNOSIS — Z95828 Presence of other vascular implants and grafts: Secondary | ICD-10-CM

## 2023-02-20 DIAGNOSIS — E119 Type 2 diabetes mellitus without complications: Secondary | ICD-10-CM | POA: Diagnosis not present

## 2023-02-20 DIAGNOSIS — Z79811 Long term (current) use of aromatase inhibitors: Secondary | ICD-10-CM | POA: Diagnosis not present

## 2023-02-20 DIAGNOSIS — Z5112 Encounter for antineoplastic immunotherapy: Secondary | ICD-10-CM | POA: Diagnosis present

## 2023-02-20 DIAGNOSIS — C50412 Malignant neoplasm of upper-outer quadrant of left female breast: Secondary | ICD-10-CM | POA: Insufficient documentation

## 2023-02-20 DIAGNOSIS — C773 Secondary and unspecified malignant neoplasm of axilla and upper limb lymph nodes: Secondary | ICD-10-CM | POA: Diagnosis not present

## 2023-02-20 LAB — CMP (CANCER CENTER ONLY)
ALT: 6 U/L (ref 0–44)
AST: 12 U/L — ABNORMAL LOW (ref 15–41)
Albumin: 3.7 g/dL (ref 3.5–5.0)
Alkaline Phosphatase: 96 U/L (ref 38–126)
Anion gap: 7 (ref 5–15)
BUN: 9 mg/dL (ref 8–23)
CO2: 26 mmol/L (ref 22–32)
Calcium: 9 mg/dL (ref 8.9–10.3)
Chloride: 105 mmol/L (ref 98–111)
Creatinine: 0.78 mg/dL (ref 0.44–1.00)
GFR, Estimated: >60 mL/min (ref >60)
Glucose, Bld: 168 mg/dL — ABNORMAL HIGH (ref 70–99)
Potassium: 3.9 mmol/L (ref 3.5–5.1)
Sodium: 138 mmol/L (ref 135–145)
Total Bilirubin: 0.5 mg/dL (ref 0.3–1.2)
Total Protein: 6.7 g/dL (ref 6.5–8.1)

## 2023-02-20 LAB — CBC WITH DIFFERENTIAL (CANCER CENTER ONLY)
Abs Immature Granulocytes: 0.02 10*3/uL (ref 0.00–0.07)
Basophils Absolute: 0 10*3/uL (ref 0.0–0.1)
Basophils Relative: 1 %
Eosinophils Absolute: 0.1 10*3/uL (ref 0.0–0.5)
Eosinophils Relative: 2 %
HCT: 36.8 % (ref 36.0–46.0)
Hemoglobin: 12.2 g/dL (ref 12.0–15.0)
Immature Granulocytes: 1 %
Lymphocytes Relative: 27 %
Lymphs Abs: 1 10*3/uL (ref 0.7–4.0)
MCH: 30 pg (ref 26.0–34.0)
MCHC: 33.2 g/dL (ref 30.0–36.0)
MCV: 90.4 fL (ref 80.0–100.0)
Monocytes Absolute: 0.5 10*3/uL (ref 0.1–1.0)
Monocytes Relative: 13 %
Neutro Abs: 2.2 10*3/uL (ref 1.7–7.7)
Neutrophils Relative %: 56 %
Platelet Count: 221 10*3/uL (ref 150–400)
RBC: 4.07 MIL/uL (ref 3.87–5.11)
RDW: 15.2 % (ref 11.5–15.5)
WBC Count: 3.9 10*3/uL — ABNORMAL LOW (ref 4.0–10.5)
nRBC: 0 % (ref 0.0–0.2)

## 2023-02-20 LAB — PHOSPHORUS: Phosphorus: 3.4 mg/dL (ref 2.5–4.6)

## 2023-02-20 LAB — MAGNESIUM: Magnesium: 1.9 mg/dL (ref 1.7–2.4)

## 2023-02-20 MED ORDER — SODIUM CHLORIDE 0.9% FLUSH
10.0000 mL | INTRAVENOUS | Status: DC | PRN
  Administered 2023-02-20: 10 mL

## 2023-02-20 MED ORDER — SODIUM CHLORIDE 0.9% FLUSH
10.0000 mL | Freq: Once | INTRAVENOUS | Status: AC
  Administered 2023-02-20: 10 mL

## 2023-02-20 MED ORDER — ACETAMINOPHEN 325 MG PO TABS
650.0000 mg | ORAL_TABLET | Freq: Once | ORAL | Status: AC
  Administered 2023-02-20: 650 mg via ORAL
  Filled 2023-02-20: qty 2

## 2023-02-20 MED ORDER — CETIRIZINE HCL 10 MG/ML IV SOLN
10.0000 mg | Freq: Once | INTRAVENOUS | Status: AC
  Administered 2023-02-20: 10 mg via INTRAVENOUS
  Filled 2023-02-20: qty 1

## 2023-02-20 MED ORDER — SODIUM CHLORIDE 0.9 % IV SOLN
150.0000 mg | Freq: Once | INTRAVENOUS | Status: AC
  Administered 2023-02-20: 150 mg via INTRAVENOUS
  Filled 2023-02-20: qty 150

## 2023-02-20 MED ORDER — SODIUM CHLORIDE 0.9 % IV SOLN
10.0000 mg | Freq: Once | INTRAVENOUS | Status: AC
  Administered 2023-02-20: 10 mg via INTRAVENOUS
  Filled 2023-02-20: qty 10

## 2023-02-20 MED ORDER — SODIUM CHLORIDE 0.9 % IV SOLN
6.0000 mg/kg | Freq: Once | INTRAVENOUS | Status: AC
  Administered 2023-02-20: 360 mg via INTRAVENOUS
  Filled 2023-02-20: qty 36

## 2023-02-20 MED ORDER — HEPARIN SOD (PORK) LOCK FLUSH 100 UNIT/ML IV SOLN
500.0000 [IU] | Freq: Once | INTRAVENOUS | Status: AC | PRN
  Administered 2023-02-20: 500 [IU]

## 2023-02-20 MED ORDER — FAMOTIDINE IN NACL 20-0.9 MG/50ML-% IV SOLN
20.0000 mg | Freq: Once | INTRAVENOUS | Status: AC
  Administered 2023-02-20: 20 mg via INTRAVENOUS
  Filled 2023-02-20: qty 50

## 2023-02-20 MED ORDER — PALONOSETRON HCL INJECTION 0.25 MG/5ML
0.2500 mg | Freq: Once | INTRAVENOUS | Status: AC
  Administered 2023-02-20: 0.25 mg via INTRAVENOUS
  Filled 2023-02-20: qty 5

## 2023-02-20 MED ORDER — ATROPINE SULFATE 1 MG/ML IV SOLN: 0.5000 mg | Freq: Once | INTRAVENOUS | Status: DC | PRN

## 2023-02-20 MED ORDER — SODIUM CHLORIDE 0.9 % IV SOLN: Freq: Once | INTRAVENOUS | Status: AC

## 2023-02-20 NOTE — Progress Notes (Signed)
[facility] Cancer Follow up:    Colleen Kern, MD [address] [place] [address]   DIAGNOSIS: Cancer Staging  Malignant neoplasm of upper-outer quadrant of left breast in female, estrogen receptor positive Staging form: Breast, AJCC 8th Edition - Clinical stage from 04/07/2020: Stage IIIB (cT4b, cN1, cM0, G2, ER+, PR+, HER2-) - Signed by Serena Croissant, MD on 04/07/2020 Stage prefix: Initial diagnosis Histologic grading system: 3 grade system - Pathologic stage from 09/14/2020: No Stage Recommended (ypT3, pN3a, cM0, G2, ER+, PR+, HER2-) - Unsigned Stage prefix: Post-therapy Histologic grading system: 3 grade system - Pathologic: Stage IV (pM1) - Signed by Loa Socks, NP on 11/20/2022   SUMMARY OF ONCOLOGIC HISTORY: Oncology History  Malignant neoplasm of upper-outer quadrant of left breast in female, estrogen receptor positive  03/22/2020 Initial Diagnosis   Diffuse left breast swelling. skin thickening and two areas of irregular hypoechogenicity in the 2-3 o'clock region, 4.9cm, and at least 4 abnormal lymph nodes  with cortical thickening. 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    10/28/2020 - 12/17/2020 Radiation Therapy   The patient initially received a dose of 50.4 Gy in 28 fractions to the left breast and supraclavicular region using whole-breast tangent fields. This was delivered using a 3-D conformal technique. The pt received a boost delivering an  additional 10 Gy in 5 fractions using a electron boost with electrons. The total dose was 60.4 Gy.   11/2020 - 11/2027 Anti-estrogen oral therapy   Anastrozole   01/30/2022 Miscellaneous   Guardant360:ERBB2 (exon 20 insertion): Benefit from Enhertu, MSI high not detected   11/20/2022 Cancer Staging   Staging form: Breast, AJCC 8th Edition - Pathologic: Stage IV (pM1) - Signed by Loa Socks, NP on 11/20/2022   11/20/2022 -  Chemotherapy   Patient is on Treatment Plan : BREAST METASTATIC Sacituzumab govitecan-hziy Drinda Butts) D1,8 q21d       CURRENT THERAPY:Trodelvy  INTERVAL HISTORY: Colleen Lowery 82 y.o. female returns for follow-up of her metastatic breast cancer currently on treatment with Drinda Butts given on days 1 and 8 of a 21-day cycle.  She continues to tolerate treatment well.  Her most recent restaging occurred March 9 and demonstrated response to therapy.  She does struggle with smells particularly that of her hand sanitizer and wears double masks while she is here.  This has caused seizures in the past though none recently and she now premeds with Valium prior to coming.  This has helped.  Otherwise she is feeling well and has no new concerns today.   Patient Active Problem List   Diagnosis Date Noted  . Metastases to the liver 11/20/2022  . Acid reflux 02/09/2021  . Acute gastritis 02/09/2021  . Port-A-Cath in place 05/31/2020  . Tobacco use 04/16/2020  . Malignant neoplasm of upper-outer quadrant of left breast in female, estrogen receptor positive 04/07/2020  . Hyperlipidemia 10/11/2017  . Presence of automatic (implantable) cardiac defibrillator 10/10/2016  . Peripheral arterial disease 06/14/2016  .  Upper airway cough syndrome 06/08/2016  . CAD in native artery 05/31/2016  . Chronic combined systolic and diastolic congestive heart failure 05/31/2016  . VT (ventricular tachycardia) 02/09/2016  . Acute on chronic combined systolic and diastolic HF (heart  failure) 02/09/2016  . Contrast media allergy 02/09/2016  . Dyspnea 02/07/2016  . Benign essential HTN 02/07/2016  . Cigarette smoker 02/07/2016  . Diabetes (HCC) 08/02/2014  . Essential hypertension 08/02/2014    is allergic to alcohol, contrast media [iodinated contrast media], iodine, atorvastatin, dust mite extract, latex, lisinopril, molds & smuts, and wound dressing adhesive.  MEDICAL HISTORY: Past Medical History:  Diagnosis Date  . AICD (automatic cardioverter/defibrillator) present   . Allergy   . Arthritis    FINGERS  . Cancer [facility])    left breast  . Cataract    right  . Crohn disease (HCC)   . Depression   . Diabetes mellitus without complication (HCC)    TYPE 2 , TAKING OTC MED FORGYMEMA  . GERD (gastroesophageal reflux disease)   . Headache    sinus  . Heart failure, acute, systolic and diastolic (HCC) MARCH 2017 DX  . History of colonic polyps   . Hypertension   . Hypothyroidism    per patient, takes OTC meds when temperature drops low  . IBS (irritable bowel syndrome)   . 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  . 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 Thyroid disease    AGE 40'S UNTIL  2016 OFF ALL THRYOID MEDS NOW   . Wears dentures   . Wears glasses     SURGICAL HISTORY: Past Surgical History:  Procedure Laterality Date  . ABDOMINAL HYSTERECTOMY     PARTIAL  . BREAST BIOPSY Left 04/01/2019   INVASIVE DUCTAL CARCINOMA  . BREAST SURGERY    . CARDIAC CATHETERIZATION  02/10/2016   Procedure: Right/Left Heart Cath and Coronary Angiography;  Surgeon: Marykay Lex, MD;  Location: [facility] INVASIVE CV LAB;  Service: Cardiovascular;;  . CATARACT EXTRACTION W/ INTRAOCULAR LENS IMPLANT     left  . COLONOSCOPY WITH PROPOFOL N/A 02/16/2017   Procedure: COLONOSCOPY WITH PROPOFOL;  Surgeon: Jeani Hawking, MD;  Location: [facility] ENDOSCOPY;  Service: Endoscopy;  Laterality: N/A;  . COLONOSCOPY WITH PROPOFOL N/A 08/12/2021   Procedure:  COLONOSCOPY WITH PROPOFOL;  Surgeon: Jeani Hawking, MD;  Location: [facility] ENDOSCOPY;  Service: Endoscopy;  Laterality: N/A;  . EP IMPLANTABLE DEVICE N/A 02/10/2016   Procedure: ICD Implant;  Surgeon: Will Jorja Loa, MD;  Location: [facility] INVASIVE CV LAB;  Service: Cardiovascular;  Laterality: N/A;  . ESOPHAGOGASTRODUODENOSCOPY (EGD) WITH PROPOFOL N/A 02/16/2017   Procedure: ESOPHAGOGASTRODUODENOSCOPY (EGD) WITH PROPOFOL;  Surgeon: Jeani Hawking, MD;  Location: [facility] ENDOSCOPY;  Service: Endoscopy;  Laterality: N/A;  reflux  . IR IMAGING GUIDED PORT INSERTION  04/27/2020  . IR IMAGING GUIDED PORT INSERTION  11/17/2022  . MASTECTOMY Left   . MASTECTOMY MODIFIED RADICAL Left 09/14/2020   Procedure: LEFT MASTECTOMY MODIFIED RADICAL;  Surgeon: Harriette Bouillon, MD;  Location: [facility] OR;  Service: General;  Laterality: Left;  PEC BLOCK  . POLYPECTOMY  08/12/2021   Procedure: POLYPECTOMY;  Surgeon: Jeani Hawking, MD;  Location: [facility] ENDOSCOPY;  Service: Endoscopy;;  . TONSILLECTOMY      SOCIAL HISTORY: Social History   Socioeconomic History  . Marital status: Widowed    Spouse name: Not on file  . Number of children: Not on file  . Years of education: Not on  file  . Highest education level: Not on file  Occupational History  . Not on file  Tobacco Use  . Smoking status: Every Day    Packs/day: 0.25    Years: 50.00    Additional pack years: 0.00    Total pack years: 12.50    Types: Cigarettes  . Smokeless tobacco: Never  . Tobacco comments:    3 puffs of each cigarette  Vaping Use  . Vaping Use: Never used  Substance and Sexual Activity  . Alcohol use: No  . Drug use: No  . Sexual activity: Not Currently  Other Topics Concern  . Not on file  Social History Narrative  . Not on file   Social Determinants of Health   Financial Resource Strain: Not on file  Food Insecurity: Not on file  Transportation Needs: Not on file  Physical Activity: Not on file  Stress: Not on file  Social  Connections: Not on file  Intimate Partner Violence: Not on file    FAMILY HISTORY: Family History  Problem Relation Age of Onset  . Diabetes Mother   . Stroke Mother   . Diabetes Brother   . Diabetes Maternal Grandmother   . Diabetes Paternal Grandfather   . Heart failure Paternal Grandfather   . Hypertension Brother   . Heart attack Neg Hx     Review of Systems  Constitutional:  Negative for appetite change, chills, fatigue, fever and unexpected weight change.  HENT:   Negative for hearing loss, lump/mass and trouble swallowing.   Eyes:  Negative for eye problems and icterus.  Respiratory:  Negative for chest tightness, cough and shortness of breath.   Cardiovascular:  Negative for chest pain, leg swelling and palpitations.  Gastrointestinal:  Negative for abdominal distention, abdominal pain, constipation, diarrhea, nausea and vomiting.  Endocrine: Negative for hot flashes.  Genitourinary:  Negative for difficulty urinating.   Musculoskeletal:  Negative for arthralgias.  Skin:  Negative for itching and rash.  Neurological:  Negative for dizziness, extremity weakness, headaches and numbness.  Hematological:  Negative for adenopathy. Does not bruise/bleed easily.  Psychiatric/Behavioral:  Negative for depression. The patient is not nervous/anxious.       PHYSICAL EXAMINATION  ECOG PERFORMANCE STATUS: {CHL ONC ECOG XL:[id number]}PS:[id number]}  Vitals:   02/20/23 1220  BP: (!) 169/102  Pulse: 90  Temp: 97.8 F (36.6 C)  SpO2: 100%    Physical Exam Constitutional:      General: She is not in acute distress.    Appearance: Normal appearance. She is not toxic-appearing.  HENT:     Head: Normocephalic and atraumatic.  Eyes:     General: No scleral icterus. Cardiovascular:     Rate and Rhythm: Normal rate and regular rhythm.     Pulses: Normal pulses.     Heart sounds: Normal heart sounds.  Pulmonary:     Effort: Pulmonary effort is normal.     Breath sounds: Normal breath  sounds.  Abdominal:     General: Abdomen is flat. Bowel sounds are normal. There is no distension.     Palpations: Abdomen is soft.     Tenderness: There is no abdominal tenderness.  Musculoskeletal:        General: No swelling.     Cervical back: Neck supple.  Lymphadenopathy:     Cervical: No cervical adenopathy.  Skin:    General: Skin is warm and dry.     Findings: No rash.  Neurological:     General: No focal deficit  present.     Mental Status: She is alert.  Psychiatric:        Mood and Affect: Mood normal.        Behavior: Behavior normal.    LABORATORY DATA:  CBC    Component Value Date/Time   WBC 3.9 (L) 02/20/2023 1159   WBC 4.1 12/19/2022 1828   RBC 4.07 02/20/2023 1159   HGB 12.2 02/20/2023 1159   HGB 13.9 06/20/2012 1055   HCT 36.8 02/20/2023 1159   HCT 40.1 06/20/2012 1055   PLT 221 02/20/2023 1159   PLT 308 06/20/2012 1055   MCV 90.4 02/20/2023 1159   MCV 93 06/20/2012 1055   MCH 30.0 02/20/2023 1159   MCHC 33.2 02/20/2023 1159   RDW 15.2 02/20/2023 1159   RDW 14.3 06/20/2012 1055   LYMPHSABS 1.0 02/20/2023 1159   LYMPHSABS 2.1 06/20/2012 1055   MONOABS 0.5 02/20/2023 1159   MONOABS 0.6 06/20/2012 1055   EOSABS 0.1 02/20/2023 1159   EOSABS 0.0 06/20/2012 1055   BASOSABS 0.0 02/20/2023 1159   BASOSABS 0.1 06/20/2012 1055    CMP     Component Value Date/Time   NA 140 02/06/2023 0929   NA 141 06/20/2012 1055   K 4.1 02/06/2023 0929   K 3.6 06/20/2012 1055   CL 107 02/06/2023 0929   CL 109 (H) 06/20/2012 1055   CO2 27 02/06/2023 0929   CO2 25 06/20/2012 1055   GLUCOSE 234 (H) 02/06/2023 0929   GLUCOSE 173 (H) 06/20/2012 1055   BUN 16 02/06/2023 0929   BUN 12 06/20/2012 1055   CREATININE 0.79 02/06/2023 0929   CREATININE 0.92 05/09/2016 1053   CALCIUM 9.6 02/06/2023 0929   CALCIUM 9.0 06/20/2012 1055   PROT 6.3 (L) 02/06/2023 0929   ALBUMIN 3.8 02/06/2023 0929   AST 11 (L) 02/06/2023 0929   ALT 7 02/06/2023 0929   ALKPHOS 81  02/06/2023 0929   BILITOT 0.4 02/06/2023 0929   GFRNONAA >60 02/06/2023 0929   GFRNONAA >60 06/20/2012 1055   GFRAA >60 08/02/2020 0853   GFRAA >60 06/20/2012 1055       PENDING LABS:   RADIOGRAPHIC STUDIES:  No results found.   PATHOLOGY:     ASSESSMENT and THERAPY PLAN:   No problem-specific Assessment & Plan notes found for this encounter.   No orders of the defined types were placed in this encounter.   All questions were answered. The patient knows to call the clinic with any problems, questions or concerns. We can certainly see the patient much sooner if necessary. This note was electronically signed. Noreene Filbert, NP 02/20/2023

## 2023-02-20 NOTE — Assessment & Plan Note (Addendum)
Colleen Lowery is a 82 year old woman with metastatic breast cancer currently on treatment with [place].  I reviewed her labs with her in detail and she will proceed with Colleen Lowery today.  She has no signs of breast cancer progression and we discussed restaging after cycle 6 or 7 of her Trodelvy therapy.  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  Colleen Lowery will return in 1 week for labs and day 8 Trodelvy.  We will see her back in 3 weeks for her next cycle of treatment.

## 2023-02-20 NOTE — Patient Instructions (Signed)
[facility]  Discharge Instructions: Thank you for choosing [facility] to provide your oncology and hematology care.   If you have a lab appointment with the Cancer Center, please go directly to the Cancer Center and check in at the registration area.   Wear comfortable clothing and clothing appropriate for easy access to any Portacath or PICC line.   We strive to give you quality time with your provider. You may need to reschedule your appointment if you arrive late (15 or more minutes).  Arriving late affects you and other patients whose appointments are after yours.  Also, if you miss three or more appointments without notifying the office, you may be dismissed from the clinic at the provider's discretion.      For prescription refill requests, have your pharmacy contact our office and allow 72 hours for refills to be completed.    Today you received the following chemotherapy and/or immunotherapy agents: Trodelvy      To help prevent nausea and vomiting after your treatment, we encourage you to take your nausea medication as directed.  BELOW ARE SYMPTOMS THAT SHOULD BE REPORTED IMMEDIATELY: *FEVER GREATER THAN 100.4 F (38 C) OR HIGHER *CHILLS OR SWEATING *NAUSEA AND VOMITING THAT IS NOT CONTROLLED WITH YOUR NAUSEA MEDICATION *UNUSUAL SHORTNESS OF BREATH *UNUSUAL BRUISING OR BLEEDING *URINARY PROBLEMS (pain or burning when urinating, or frequent urination) *BOWEL PROBLEMS (unusual diarrhea, constipation, pain near the anus) TENDERNESS IN MOUTH AND THROAT WITH OR WITHOUT PRESENCE OF ULCERS (sore throat, sores in mouth, or a toothache) UNUSUAL RASH, SWELLING OR PAIN  UNUSUAL VAGINAL DISCHARGE OR ITCHING   Items with * indicate a potential emergency and should be followed up as soon as possible or go to the Emergency Department if any problems should occur.  Please show the CHEMOTHERAPY ALERT CARD or IMMUNOTHERAPY ALERT CARD at  check-in to the Emergency Department and triage nurse.  Should you have questions after your visit or need to cancel or reschedule your appointment, please contact [facility]  Dept: [phone]  and follow the prompts.  Office hours are 8:00 a.m. to 4:30 p.m. Monday - Friday. Please note that voicemails left after 4:00 p.m. may not be returned until the following business day.  We are closed weekends and major holidays. You have access to a nurse at all times for urgent questions. Please call the main number to the clinic Dept: [phone] and follow the prompts.   For any non-urgent questions, you may also contact your provider using MyChart. We now offer e-Visits for anyone 18 and older to request care online for non-urgent symptoms. For details visit [email or web address].   Also download the MyChart app! Go to the app store, search "MyChart", open the app, select [facility], and log in with your MyChart username and password.   

## 2023-02-21 ENCOUNTER — Encounter: Payer: Self-pay | Admitting: Hematology and Oncology

## 2023-02-21 ENCOUNTER — Telehealth: Payer: Self-pay | Admitting: Hematology and Oncology

## 2023-02-21 NOTE — Telephone Encounter (Signed)
Scheduled appointment per WQ. Patient is aware of the made appointments.  

## 2023-02-26 MED FILL — Dexamethasone Sodium Phosphate Inj 100 MG/10ML: INTRAMUSCULAR | Qty: 1 | Status: AC

## 2023-02-26 MED FILL — Fosaprepitant Dimeglumine For IV Infusion 150 MG (Base Eq): INTRAVENOUS | Qty: 5 | Status: AC

## 2023-02-27 ENCOUNTER — Inpatient Hospital Stay: Payer: Medicare HMO

## 2023-02-27 ENCOUNTER — Other Ambulatory Visit: Payer: Self-pay

## 2023-02-27 VITALS — BP 142/80 | HR 77 | Temp 98.2°F | Resp 16 | Wt 141.2 lb

## 2023-02-27 DIAGNOSIS — Z170 Estrogen receptor positive status [ER+]: Secondary | ICD-10-CM

## 2023-02-27 DIAGNOSIS — Z5112 Encounter for antineoplastic immunotherapy: Secondary | ICD-10-CM | POA: Diagnosis not present

## 2023-02-27 DIAGNOSIS — Z95828 Presence of other vascular implants and grafts: Secondary | ICD-10-CM

## 2023-02-27 LAB — CBC WITH DIFFERENTIAL (CANCER CENTER ONLY)
Abs Immature Granulocytes: 0.08 10*3/uL — ABNORMAL HIGH (ref 0.00–0.07)
Basophils Absolute: 0 10*3/uL (ref 0.0–0.1)
Basophils Relative: 1 %
Eosinophils Absolute: 0 10*3/uL (ref 0.0–0.5)
Eosinophils Relative: 1 %
HCT: 34 % — ABNORMAL LOW (ref 36.0–46.0)
Hemoglobin: 11.7 g/dL — ABNORMAL LOW (ref 12.0–15.0)
Immature Granulocytes: 2 %
Lymphocytes Relative: 21 %
Lymphs Abs: 0.9 10*3/uL (ref 0.7–4.0)
MCH: 30.7 pg (ref 26.0–34.0)
MCHC: 34.4 g/dL (ref 30.0–36.0)
MCV: 89.2 fL (ref 80.0–100.0)
Monocytes Absolute: 0.5 10*3/uL (ref 0.1–1.0)
Monocytes Relative: 13 %
Neutro Abs: 2.6 10*3/uL (ref 1.7–7.7)
Neutrophils Relative %: 62 %
Platelet Count: 177 10*3/uL (ref 150–400)
RBC: 3.81 MIL/uL — ABNORMAL LOW (ref 3.87–5.11)
RDW: 15.2 % (ref 11.5–15.5)
WBC Count: 4.1 10*3/uL (ref 4.0–10.5)
nRBC: 0 % (ref 0.0–0.2)

## 2023-02-27 LAB — CMP (CANCER CENTER ONLY)
ALT: 8 U/L (ref 0–44)
AST: 12 U/L — ABNORMAL LOW (ref 15–41)
Albumin: 3.7 g/dL (ref 3.5–5.0)
Alkaline Phosphatase: 83 U/L (ref 38–126)
Anion gap: 7 (ref 5–15)
BUN: 10 mg/dL (ref 8–23)
CO2: 24 mmol/L (ref 22–32)
Calcium: 9.1 mg/dL (ref 8.9–10.3)
Chloride: 104 mmol/L (ref 98–111)
Creatinine: 0.87 mg/dL (ref 0.44–1.00)
GFR, Estimated: >60 mL/min (ref >60)
Glucose, Bld: 324 mg/dL — ABNORMAL HIGH (ref 70–99)
Potassium: 3.7 mmol/L (ref 3.5–5.1)
Sodium: 135 mmol/L (ref 135–145)
Total Bilirubin: 0.3 mg/dL (ref 0.3–1.2)
Total Protein: 6.3 g/dL — ABNORMAL LOW (ref 6.5–8.1)

## 2023-02-27 LAB — PHOSPHORUS: Phosphorus: 3.9 mg/dL (ref 2.5–4.6)

## 2023-02-27 LAB — MAGNESIUM: Magnesium: 1.7 mg/dL (ref 1.7–2.4)

## 2023-02-27 MED ORDER — SODIUM CHLORIDE 0.9 % IV SOLN
10.0000 mg | Freq: Once | INTRAVENOUS | Status: AC
  Administered 2023-02-27: 10 mg via INTRAVENOUS
  Filled 2023-02-27: qty 10

## 2023-02-27 MED ORDER — ACETAMINOPHEN 325 MG PO TABS
650.0000 mg | ORAL_TABLET | Freq: Once | ORAL | Status: AC
  Administered 2023-02-27: 650 mg via ORAL
  Filled 2023-02-27: qty 2

## 2023-02-27 MED ORDER — PALONOSETRON HCL INJECTION 0.25 MG/5ML
0.2500 mg | Freq: Once | INTRAVENOUS | Status: AC
  Administered 2023-02-27: 0.25 mg via INTRAVENOUS
  Filled 2023-02-27: qty 5

## 2023-02-27 MED ORDER — SODIUM CHLORIDE 0.9% FLUSH
10.0000 mL | INTRAVENOUS | Status: DC | PRN
  Administered 2023-02-27: 10 mL

## 2023-02-27 MED ORDER — SODIUM CHLORIDE 0.9 % IV SOLN
6.0000 mg/kg | Freq: Once | INTRAVENOUS | Status: AC
  Administered 2023-02-27: 360 mg via INTRAVENOUS
  Filled 2023-02-27: qty 36

## 2023-02-27 MED ORDER — SODIUM CHLORIDE 0.9% FLUSH
10.0000 mL | Freq: Once | INTRAVENOUS | Status: AC
  Administered 2023-02-27: 10 mL

## 2023-02-27 MED ORDER — FAMOTIDINE IN NACL 20-0.9 MG/50ML-% IV SOLN
20.0000 mg | Freq: Once | INTRAVENOUS | Status: AC
  Administered 2023-02-27: 20 mg via INTRAVENOUS
  Filled 2023-02-27: qty 50

## 2023-02-27 MED ORDER — SODIUM CHLORIDE 0.9 % IV SOLN: Freq: Once | INTRAVENOUS | Status: AC

## 2023-02-27 MED ORDER — CETIRIZINE HCL 10 MG/ML IV SOLN
10.0000 mg | Freq: Once | INTRAVENOUS | Status: AC
  Administered 2023-02-27: 10 mg via INTRAVENOUS
  Filled 2023-02-27: qty 1

## 2023-02-27 MED ORDER — HEPARIN SOD (PORK) LOCK FLUSH 100 UNIT/ML IV SOLN
500.0000 [IU] | Freq: Once | INTRAVENOUS | Status: AC | PRN
  Administered 2023-02-27: 500 [IU]

## 2023-02-27 MED ORDER — ATROPINE SULFATE 1 MG/ML IV SOLN
0.5000 mg | Freq: Once | INTRAVENOUS | Status: DC | PRN
  Filled 2023-02-27: qty 1

## 2023-02-27 MED ORDER — INSULIN ASPART 100 UNIT/ML IJ SOLN
5.0000 [IU] | Freq: Once | INTRAMUSCULAR | Status: AC
  Administered 2023-02-27: 5 [IU] via SUBCUTANEOUS
  Filled 2023-02-27: qty 1

## 2023-02-27 MED ORDER — SODIUM CHLORIDE 0.9 % IV SOLN
150.0000 mg | Freq: Once | INTRAVENOUS | Status: AC
  Administered 2023-02-27: 150 mg via INTRAVENOUS
  Filled 2023-02-27: qty 150

## 2023-02-27 NOTE — Patient Instructions (Signed)
[facility]  Discharge Instructions: Thank you for choosing [facility] to provide your oncology and hematology care.   If you have a lab appointment with the Cancer Center, please go directly to the Cancer Center and check in at the registration area.   Wear comfortable clothing and clothing appropriate for easy access to any Portacath or PICC line.   We strive to give you quality time with your provider. You may need to reschedule your appointment if you arrive late (15 or more minutes).  Arriving late affects you and other patients whose appointments are after yours.  Also, if you miss three or more appointments without notifying the office, you may be dismissed from the clinic at the provider's discretion.      For prescription refill requests, have your pharmacy contact our office and allow 72 hours for refills to be completed.    Today you received the following chemotherapy and/or immunotherapy agent: Sacituzumab Drinda Butts)   To help prevent nausea and vomiting after your treatment, we encourage you to take your nausea medication as directed.  BELOW ARE SYMPTOMS THAT SHOULD BE REPORTED IMMEDIATELY: *FEVER GREATER THAN 100.4 F (38 C) OR HIGHER *CHILLS OR SWEATING *NAUSEA AND VOMITING THAT IS NOT CONTROLLED WITH YOUR NAUSEA MEDICATION *UNUSUAL SHORTNESS OF BREATH *UNUSUAL BRUISING OR BLEEDING *URINARY PROBLEMS (pain or burning when urinating, or frequent urination) *BOWEL PROBLEMS (unusual diarrhea, constipation, pain near the anus) TENDERNESS IN MOUTH AND THROAT WITH OR WITHOUT PRESENCE OF ULCERS (sore throat, sores in mouth, or a toothache) UNUSUAL RASH, SWELLING OR PAIN  UNUSUAL VAGINAL DISCHARGE OR ITCHING   Items with * indicate a potential emergency and should be followed up as soon as possible or go to the Emergency Department if any problems should occur.  Please show the CHEMOTHERAPY ALERT CARD or IMMUNOTHERAPY ALERT CARD  at check-in to the Emergency Department and triage nurse.  Should you have questions after your visit or need to cancel or reschedule your appointment, please contact [place] [facility]  Dept: [phone]  and follow the prompts.  Office hours are 8:00 a.m. to 4:30 p.m. Monday - Friday. Please note that voicemails left after 4:00 p.m. may not be returned until the following business day.  We are closed weekends and major holidays. You have access to a nurse at all times for urgent questions. Please call the main number to the clinic Dept: [phone] and follow the prompts.   For any non-urgent questions, you may also contact your provider using MyChart. We now offer e-Visits for anyone 63 and older to request care online for non-urgent symptoms. For details visit [email or web address].   Also download the MyChart app! Go to the app store, search "MyChart", open the app, select [facility], and log in with your MyChart username and password. Sacituzumab Govitecan Injection What is this medication? SACITUZUMAB GOVITECAN (SAK i TOOZ ue mab GOE vi TEE kan) treats breast cancer. It may also be used to treat bladder cancer and kidney cancer. It works by blocking a protein that causes cancer cells to grow and multiply. This helps to slow or stop the spread of cancer cells. This medicine may be used for other purposes; ask your health care provider or pharmacist if you have questions. COMMON BRAND NAME(S): TRODELVY What should I tell my care team before I take this medication? They need to know if you have any of these conditions: Carry the UGT1A1*28 gene Infection  Liver disease An unusual or allergic reaction to sacituzumab govitecan, other medications, foods, dyes, or preservatives Pregnant or trying to get pregnant Breast-feeding How should I use this medication? This medication is injected into a vein. It is given by your care team in a hospital or clinic  setting. Talk to your care team about the use of this medication in children. Special care may be needed. Overdosage: If you think you have taken too much of this medicine contact a poison control center or emergency room at once. NOTE: This medicine is only for you. Do not share this medicine with others. What if I miss a dose? Keep appointments for follow-up doses. It is important not to miss your dose. Call your care team if you are unable to keep an appointment. What may interact with this medication? This medication may affect how other medications work, and other medications may affect the way this medication works. Talk with your care team about all of the medications you take. They may suggest changes to your treatment plan to lower the risk of side effects and to make sure your medications work as intended. This list may not describe all possible interactions. Give your health care provider a list of all the medicines, herbs, non-prescription drugs, or dietary supplements you use. Also tell them if you smoke, drink alcohol, or use illegal drugs. Some items may interact with your medicine. What should I watch for while using this medication? This medication may make you feel generally unwell. This is not uncommon as chemotherapy can affect healthy cells as well as cancer cells. Report any side effects. Continue your course of treatment even though you feel ill unless your care team tells you to stop. You may need blood work while you are taking this medication. Certain genetic factors may decrease the safety of this medication. Your care team may use genetic tests to determine treatment. This medication can cause serious allergic reactions. To reduce your risk, your care team may give you other medications to take before receiving this one. Be sure to follow the directions from your care team. Check with your care team if you have severe diarrhea, nausea, and vomiting, or if you sweat a lot.  The loss of too much body fluid may make it dangerous for you to take this medication. Talk to your care team if you wish to become pregnant or think you might be pregnant. This medication can cause serious birth defects if taken during pregnancy or if you get pregnant within 6 months after stopping treatment. A negative pregnancy test is required before starting this medication. A reliable form of contraception is recommended while taking this medication and for 6 months after stopping treatment. Talk to your care team about reliable forms of contraception. Use a condom during sex and for 3 months after stopping treatment. Tell your care team right away if you think your partner might be pregnant. This medication can cause serious birth defects. Do not breast-feed while taking this medication and for 1 month after stopping therapy. This medication may cause infertility. Talk to your care team if you are concerned about your fertility. This medication may increase your risk of getting an infection. Call your care team for advice if you get a fever, chills, sore throat, or other symptoms of a cold or flu. Do not treat yourself. Try to avoid being around people who are sick. Avoid taking medications that contain aspirin, acetaminophen, ibuprofen, naproxen, or ketoprofen unless instructed by your care  team. These medications may hide a fever. This medication may increase blood sugar. The risk may be higher in patients who already have diabetes. Ask your care team what you can do to lower your risk of diabetes while taking this medication. What side effects may I notice from receiving this medication? Side effects that you should report to your care team as soon as possible: Allergic reactions--skin rash, itching, hives, swelling of the face, lips, tongue, or throat Infection--fever, chills, cough, or sore throat Infusion reactions--chest pain, shortness of breath or trouble breathing, feeling faint or  lightheaded Low red blood cell level--unusual weakness or fatigue, dizziness, headache, trouble breathing Severe or prolonged diarrhea Side effects that usually do not require medical attention (report these to your care team if they continue or are bothersome): Constipation Diarrhea Fatigue Hair loss Loss of appetite Nausea Vomiting This list may not describe all possible side effects. Call your doctor for medical advice about side effects. You may report side effects to FDA at 1-800-FDA-1088. Where should I keep my medication? This medication is given in a hospital or clinic. It will not be stored at home. NOTE: This sheet is a summary. It may not cover all possible information. If you have questions about this medicine, talk to your doctor, pharmacist, or health care provider.  2023 Elsevier/Gold Standard (2021-12-29 00:00:00)

## 2023-02-27 NOTE — Progress Notes (Signed)
Pt. Glucose 324 mg/dL Dr. Pamelia Hoit notified, new order received, medicated with Novolog insulin 5 units.

## 2023-03-13 MED FILL — Dexamethasone Sodium Phosphate Inj 100 MG/10ML: INTRAMUSCULAR | Qty: 1 | Status: AC

## 2023-03-13 MED FILL — Fosaprepitant Dimeglumine For IV Infusion 150 MG (Base Eq): INTRAVENOUS | Qty: 5 | Status: AC

## 2023-03-13 NOTE — Progress Notes (Unsigned)
Palliative Medicine [facility]  Telephone:([phone] [phone]   Name: Colleen Lowery Date: 03/13/2023 MRN: [id number]  DOB: 10-29-1941  Patient Care Team: Dortha Kern, MD as PCP - General (Family Medicine) Dorothy Puffer, MD as Consulting Physician (Radiation Oncology) Serena Croissant, MD as Consulting Physician (Hematology and Oncology) Harriette Bouillon, MD as Consulting Physician (General Surgery) Windell Norfolk, MD as Consulting Physician (Neurology)    INTERVAL HISTORY: Colleen Lowery is a 82 y.o. 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 Past medical history also includes seizures, diabetes, and HTN. Palliative ask to see for symptom management and goals of care.   SOCIAL HISTORY:     reports that she has been smoking cigarettes. She has a 12.50 pack-year smoking history. She has never used smokeless tobacco. She reports that she does not drink alcohol and does not use drugs.  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 , TAKING OTC MED FORGYMEMA   GERD (gastroesophageal reflux disease)    Headache    sinus   Heart failure, acute, systolic and diastolic (HCC) MARCH 2017 DX   History of colonic polyps    Hypertension    Hypothyroidism    per patient, takes OTC meds when temperature drops low   IBS (irritable bowel syndrome)    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  MEDICATIONS:  Current Outpatient Medications  Medication Sig Dispense Refill   alendronate (FOSAMAX) 70 MG tablet Take 1 tablet (70 mg total) by mouth once a week. Take with a full glass of water on an empty stomach. 12 tablet 3   Ascorbic Acid (VITAMIN C PO) Take 2,000 mg by mouth daily.     B Complex Vitamins (VITAMIN B COMPLEX) TABS Take 2 tablets by mouth daily.     B Complex-Folic Acid (B COMPLEX PLUS PO) Take 4 tablets by mouth daily.     Beta Carotene (VITAMIN A) 25000 UNIT capsule Take 50,000 Units by mouth daily.     CALCIUM PO Take 10 mLs by mouth daily. Liquid 300 MG /5 ML     Cholecalciferol (VITAMIN D3) 5000 units CAPS Take 5,000 Units by mouth daily. 2500 units per cap     diazepam (VALIUM) 2 MG tablet SMARTSIG:1-2 Tablet(s) By Mouth As Directed     Flaxseed, Linseed, (FLAXSEED OIL) 1000 MG CAPS Take 3,000 mg by mouth daily. Taking as needed     furosemide (LASIX) 20 MG tablet Take 20 mg by mouth daily.     Homeopathic Products (ARNICARE PAIN RELIEF EX) Apply 1 application  topically  daily as needed (pain).     levETIRAcetam (KEPPRA) 500 MG tablet Take 1 tablet (500 mg total) by mouth 2 (two) times daily. 60 tablet 11   lidocaine-prilocaine (EMLA) cream Apply to affected area once 30 g 3   liothyronine (CYTOMEL) 25 MCG tablet Take 25 mcg by mouth daily.     loperamide (IMODIUM) 2 MG capsule Take 2 tabs by mouth with first loose stool, then 1 tab with each additional loose stool as needed. Do not exceed 8 tabs in a 24-hour period 30 capsule 0   losartan (COZAAR) 50 MG tablet Take 50 mg by mouth daily.     MAGNESIUM PO Take 5 mLs by mouth daily. Liquid 2.5 ml/150 mg     Misc Natural Products (BLOOD SUGAR BALANCE PO) Take 3 capsules by mouth daily.  Blood Sugar Manager     ondansetron (ZOFRAN) 8 MG tablet Take 1 tablet (8 mg total) by mouth every 8 (eight) hours as needed for nausea or vomiting. Take 1 tablets (8mg  total) by mouth every 8hrs as needed. Start on the third day after chemotherapy. (Patient not taking: Reported on 02/20/2023) 30 tablet 1   OVER THE COUNTER MEDICATION Take 1 Scoop by mouth daily. Branched chain amino acids 4000  Patient states "taking for muscles"     OVER THE COUNTER MEDICATION Take 400-800 mg by mouth daily as needed (Based on Blood glucose). gymnema sylvestre Patient takes for "lowering blood glucose"     OVER THE COUNTER MEDICATION Take 5 capsules by mouth daily. Pau d' arco 500 mg each  Patient states now taking liquid and "keeps mold down"     OVER THE COUNTER MEDICATION Take 6 capsules by mouth daily. Caprylic acid Patient taking to "lower candida risk"     OVER THE COUNTER MEDICATION Take 5 capsules by mouth daily. Pancreatic enzymes Patient taking "to get fluid out"     OVER THE COUNTER MEDICATION Take 3 capsules by mouth daily. Raw Adrenal Patient taking for "overall energy"     OVER THE COUNTER MEDICATION Take 6 capsules by mouth daily. Beef Pancreas Patient taking for "pancreatic function improvement and lymphatic system improvement"     PANTETHINE PO Take 1-2 tablets by mouth daily as needed (Itching). Vitamin B5     potassium chloride SA (K-DUR,KLOR-CON) 20 MEQ tablet Take 20 mEq by mouth 2 (two) times daily.     prochlorperazine (COMPAZINE) 10 MG tablet Take 1 tablet (10 mg total) by mouth every 6 (six) hours as needed for nausea or vomiting. (Patient not taking: Reported on 02/20/2023) 30 tablet 1   PROLENSA 0.07 % SOLN Place 1 drop into the right eye 4 (four) times daily.     Propylene Glycol (SYSTANE BALANCE) 0.6 % SOLN Place 2 drops into the right eye in the morning and at bedtime.     TRUE METRIX BLOOD GLUCOSE TEST test strip SMARTSIG:Via Meter     TRUEplus Lancets 33G MISC      No current  facility-administered medications for this visit.    VITAL SIGNS: There were no vitals taken for this visit. There were no vitals filed for this visit.  Estimated body mass index is 25.83 kg/m as calculated from the following:   Height as of 01/30/23: 5\' 2"  (1.575 m).   Weight as of 02/27/23: 141 lb 4 oz (64.1 kg).   PERFORMANCE STATUS (ECOG) : 1 - Symptomatic but completely ambulatory   Physical Exam General: NAD Cardiovascular: regular rate and rhythm Pulmonary: clear ant  fields Abdomen: soft, nontender, + bowel sounds, right abdomen is slightly swollen as compared with left Extremities: mild non-pitting edema to hands and feet, no joint deformities Skin: no rashes Neurological: A/O x 4,   IMPRESSION:   Anxiety: Ms.Mendez continues to experience anxiety related to going out in public. She is afraid of coming into contact with strong odors, especially Purell hand sanitizer - that might trigger more seizures. Reports having obtained Valium and tried taking one tablet. The one tablet was "too strong." Ms. Lindler has tried 1/2 tablet and reports good effect without feeling as "hung over" the next day.  Emotional supports provided and encouragement to continue utilizing the Valium prior to appointments to help decrease anxiety.      2. Goals of Care  3/12- Yesterday Ms. Logue completed HCPOA paperwork listing her brother Juliene Pina as her primary decision maker in the event she is unable to speak for herself.  Ms. Fuquay remains clear in expressed wishes to continue to treat the the treatable allowing her every opportunity to continue to thrive. Taking life one day at a time. She is encouraged to notify the palliative care office for any symptoms that may arise, such as pain.  We discussed her current illness and what it means in the larger context of Her on-going co-morbidities. Natural disease trajectory and expectations were discussed. We discussed the importance of continued  conversation with family and their medical providers regarding overall plan of care and treatment options, ensuring decisions are within the context of the patients values and GOCs.  PLAN: Monitor use and efficacy of Valium in the management of anxiety. Encourage reporting of any symptoms that may arise. Continue goals of care conversations regarding patient's wishes in the context of her disease process. Palliative will plan to see patient back in 2-4 weeks in collaboration to other oncology appointments.    Patient expressed understanding and was in agreement with this plan. She also understands that She can call the clinic at any time with any questions, concerns, or complaints.   Any controlled substances utilized were prescribed in the context of palliative care. PDMP has been reviewed.   Time Total: 40 min   Visit consisted of counseling and education dealing with the complex and emotionally intense issues of symptom management and palliative care in the setting of serious and potentially life-threatening illness.Greater than 50%  of this time was spent counseling and coordinating care related to the above assessment and plan.  Willette Alma, AGPCNP-BC  Palliative Medicine Team/[facility]  *Please note that this is a verbal dictation therefore any spelling or grammatical errors are due to the "Dragon Medical One" system interpretation.

## 2023-03-14 ENCOUNTER — Inpatient Hospital Stay (HOSPITAL_BASED_OUTPATIENT_CLINIC_OR_DEPARTMENT_OTHER): Payer: Medicare HMO | Admitting: Nurse Practitioner

## 2023-03-14 ENCOUNTER — Inpatient Hospital Stay: Payer: Medicare HMO

## 2023-03-14 ENCOUNTER — Encounter: Payer: Self-pay | Admitting: Nurse Practitioner

## 2023-03-14 ENCOUNTER — Inpatient Hospital Stay: Payer: Medicare HMO | Attending: Hematology and Oncology | Admitting: Hematology and Oncology

## 2023-03-14 VITALS — BP 147/81 | HR 93 | Temp 97.9°F | Resp 18 | Ht 62.0 in | Wt 133.3 lb

## 2023-03-14 VITALS — BP 160/97 | HR 79 | Resp 17

## 2023-03-14 DIAGNOSIS — Z66 Do not resuscitate: Secondary | ICD-10-CM | POA: Diagnosis not present

## 2023-03-14 DIAGNOSIS — F419 Anxiety disorder, unspecified: Secondary | ICD-10-CM | POA: Insufficient documentation

## 2023-03-14 DIAGNOSIS — Z515 Encounter for palliative care: Secondary | ICD-10-CM | POA: Diagnosis not present

## 2023-03-14 DIAGNOSIS — F17210 Nicotine dependence, cigarettes, uncomplicated: Secondary | ICD-10-CM | POA: Insufficient documentation

## 2023-03-14 DIAGNOSIS — Z95828 Presence of other vascular implants and grafts: Secondary | ICD-10-CM

## 2023-03-14 DIAGNOSIS — Z5112 Encounter for antineoplastic immunotherapy: Secondary | ICD-10-CM | POA: Diagnosis present

## 2023-03-14 DIAGNOSIS — C787 Secondary malignant neoplasm of liver and intrahepatic bile duct: Secondary | ICD-10-CM | POA: Diagnosis not present

## 2023-03-14 DIAGNOSIS — C50412 Malignant neoplasm of upper-outer quadrant of left female breast: Secondary | ICD-10-CM

## 2023-03-14 DIAGNOSIS — Z170 Estrogen receptor positive status [ER+]: Secondary | ICD-10-CM

## 2023-03-14 LAB — CMP (CANCER CENTER ONLY)
ALT: 8 U/L (ref 0–44)
AST: 14 U/L — ABNORMAL LOW (ref 15–41)
Albumin: 4 g/dL (ref 3.5–5.0)
Alkaline Phosphatase: 86 U/L (ref 38–126)
Anion gap: 8 (ref 5–15)
BUN: 14 mg/dL (ref 8–23)
CO2: 24 mmol/L (ref 22–32)
Calcium: 8.8 mg/dL — ABNORMAL LOW (ref 8.9–10.3)
Chloride: 106 mmol/L (ref 98–111)
Creatinine: 0.8 mg/dL (ref 0.44–1.00)
GFR, Estimated: >60 mL/min (ref >60)
Glucose, Bld: 178 mg/dL — ABNORMAL HIGH (ref 70–99)
Potassium: 3.8 mmol/L (ref 3.5–5.1)
Sodium: 138 mmol/L (ref 135–145)
Total Bilirubin: 0.5 mg/dL (ref 0.3–1.2)
Total Protein: 6.8 g/dL (ref 6.5–8.1)

## 2023-03-14 LAB — CBC WITH DIFFERENTIAL (CANCER CENTER ONLY)
Abs Immature Granulocytes: 0.03 10*3/uL (ref 0.00–0.07)
Basophils Absolute: 0 10*3/uL (ref 0.0–0.1)
Basophils Relative: 1 %
Eosinophils Absolute: 0.1 10*3/uL (ref 0.0–0.5)
Eosinophils Relative: 1 %
HCT: 40 % (ref 36.0–46.0)
Hemoglobin: 13.2 g/dL (ref 12.0–15.0)
Immature Granulocytes: 1 %
Lymphocytes Relative: 22 %
Lymphs Abs: 1 10*3/uL (ref 0.7–4.0)
MCH: 30.1 pg (ref 26.0–34.0)
MCHC: 33 g/dL (ref 30.0–36.0)
MCV: 91.3 fL (ref 80.0–100.0)
Monocytes Absolute: 0.6 10*3/uL (ref 0.1–1.0)
Monocytes Relative: 13 %
Neutro Abs: 2.9 10*3/uL (ref 1.7–7.7)
Neutrophils Relative %: 62 %
Platelet Count: 217 10*3/uL (ref 150–400)
RBC: 4.38 MIL/uL (ref 3.87–5.11)
RDW: 14.7 % (ref 11.5–15.5)
WBC Count: 4.7 10*3/uL (ref 4.0–10.5)
nRBC: 0 % (ref 0.0–0.2)

## 2023-03-14 LAB — MAGNESIUM: Magnesium: 1.7 mg/dL (ref 1.7–2.4)

## 2023-03-14 LAB — PHOSPHORUS: Phosphorus: 3.7 mg/dL (ref 2.5–4.6)

## 2023-03-14 MED ORDER — FAMOTIDINE 20 MG IN NS 100 ML IVPB
20.0000 mg | Freq: Once | INTRAVENOUS | Status: AC
  Administered 2023-03-14: 20 mg via INTRAVENOUS
  Filled 2023-03-14: qty 100

## 2023-03-14 MED ORDER — SODIUM CHLORIDE 0.9 % IV SOLN
10.0000 mg | Freq: Once | INTRAVENOUS | Status: AC
  Administered 2023-03-14: 10 mg via INTRAVENOUS
  Filled 2023-03-14: qty 10

## 2023-03-14 MED ORDER — SODIUM CHLORIDE 0.9 % IV SOLN
150.0000 mg | Freq: Once | INTRAVENOUS | Status: AC
  Administered 2023-03-14: 150 mg via INTRAVENOUS
  Filled 2023-03-14: qty 150

## 2023-03-14 MED ORDER — SODIUM CHLORIDE 0.9% FLUSH
10.0000 mL | INTRAVENOUS | Status: DC | PRN
  Administered 2023-03-14: 10 mL

## 2023-03-14 MED ORDER — SODIUM CHLORIDE 0.9 % IV SOLN
6.0000 mg/kg | Freq: Once | INTRAVENOUS | Status: AC
  Administered 2023-03-14: 360 mg via INTRAVENOUS
  Filled 2023-03-14: qty 36

## 2023-03-14 MED ORDER — CETIRIZINE HCL 10 MG/ML IV SOLN
10.0000 mg | Freq: Once | INTRAVENOUS | Status: AC
  Administered 2023-03-14: 10 mg via INTRAVENOUS
  Filled 2023-03-14: qty 1

## 2023-03-14 MED ORDER — SODIUM CHLORIDE 0.9% FLUSH
10.0000 mL | Freq: Once | INTRAVENOUS | Status: AC
  Administered 2023-03-14: 10 mL

## 2023-03-14 MED ORDER — HEPARIN SOD (PORK) LOCK FLUSH 100 UNIT/ML IV SOLN
500.0000 [IU] | Freq: Once | INTRAVENOUS | Status: AC | PRN
  Administered 2023-03-14: 500 [IU]

## 2023-03-14 MED ORDER — SODIUM CHLORIDE 0.9 % IV SOLN: Freq: Once | INTRAVENOUS | Status: AC

## 2023-03-14 MED ORDER — ACETAMINOPHEN 325 MG PO TABS
650.0000 mg | ORAL_TABLET | Freq: Once | ORAL | Status: AC
  Administered 2023-03-14: 650 mg via ORAL
  Filled 2023-03-14: qty 2

## 2023-03-14 MED ORDER — PALONOSETRON HCL INJECTION 0.25 MG/5ML
0.2500 mg | Freq: Once | INTRAVENOUS | Status: AC
  Administered 2023-03-14: 0.25 mg via INTRAVENOUS
  Filled 2023-03-14: qty 5

## 2023-03-14 NOTE — Progress Notes (Signed)
Patient Care Team: Dortha Kern, MD as PCP - General (Family Medicine) Dorothy Puffer, MD as Consulting Physician (Radiation Oncology) Serena Croissant, MD as Consulting Physician (Hematology and Oncology) Harriette Bouillon, MD as Consulting Physician (General Surgery) Windell Norfolk, MD as Consulting Physician (Neurology)  DIAGNOSIS:  Encounter Diagnosis  Name Primary?   Malignant neoplasm of upper-outer quadrant of left breast in female, estrogen receptor positive (HCC) Yes    SUMMARY OF ONCOLOGIC HISTORY: Oncology History  Malignant neoplasm of upper-outer quadrant of left breast in female, estrogen receptor positive (HCC)  03/22/2020 Initial Diagnosis   Diffuse left breast swelling. skin thickening and two areas of irregular hypoechogenicity in the 2-3 o'clock region, 4.9cm, and at least 4 abnormal lymph nodes  with cortical thickening. 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    10/28/2020 - 12/17/2020 Radiation Therapy   The patient initially received a dose of 50.4 Gy in 28 fractions to the left breast and supraclavicular region using whole-breast tangent fields. This was delivered using a 3-D conformal technique. The pt received a boost delivering an additional 10 Gy in 5 fractions using a electron boost with electrons. The total dose was 60.4 Gy.   11/2020 - 11/2027 Anti-estrogen oral therapy   Anastrozole   01/30/2022 Miscellaneous   Guardant360:ERBB2 (exon 20 insertion): Benefit from  Enhertu, MSI high not detected   11/20/2022 Cancer Staging   Staging form: Breast, AJCC 8th Edition - Pathologic: Stage IV (pM1) - Signed by Loa Socks, NP on 11/20/2022   11/20/2022 -  Chemotherapy   Patient is on Treatment Plan : BREAST METASTATIC Sacituzumab govitecan-hziy Drinda Butts) D1,8 q21d       CHIEF COMPLIANT: metastatic breast cancer cycle 6 Trodelvy day 1  INTERVAL HISTORY: Colleen Lowery is a 82 y.o. with above-mentioned history of metastatic breast cancer. She presents to the clinic today for a follow-up and treatment. She reports that she has some fatigue. She says it takes 1 day for the fogginess goes away. She is tolerating the treatment extremely well.   ALLERGIES:  is allergic to alcohol, contrast media [iodinated contrast media], iodine, atorvastatin, dust mite extract, latex, lisinopril, molds & smuts, and wound dressing adhesive.  MEDICATIONS:  Current Outpatient Medications  Medication Sig Dispense Refill   alendronate (FOSAMAX) 70 MG tablet Take 1 tablet (70 mg total) by mouth once a week. Take with a full glass of water on an empty stomach. 12 tablet 3   Ascorbic Acid (VITAMIN C PO) Take 2,000 mg by mouth daily.     B Complex Vitamins (VITAMIN B COMPLEX) TABS Take 2 tablets by mouth daily.     B Complex-Folic Acid (B COMPLEX PLUS PO) Take 4 tablets by mouth daily.     Beta Carotene (VITAMIN A) 25000 UNIT capsule Take 50,000 Units by mouth daily.     CALCIUM PO Take 10 mLs by mouth daily. Liquid 300 MG /5 ML     Cholecalciferol (VITAMIN D3) 5000 units CAPS Take 5,000 Units by  mouth daily. 2500 units per cap     diazepam (VALIUM) 2 MG tablet SMARTSIG:1-2 Tablet(s) By Mouth As Directed     Flaxseed, Linseed, (FLAXSEED OIL) 1000 MG CAPS Take 3,000 mg by mouth daily. Taking as needed     furosemide (LASIX) 20 MG tablet Take 20 mg by mouth daily.     Homeopathic Products (ARNICARE PAIN RELIEF EX) Apply 1 application  topically daily as needed (pain).      levETIRAcetam (KEPPRA) 500 MG tablet Take 1 tablet (500 mg total) by mouth 2 (two) times daily. 60 tablet 11   lidocaine-prilocaine (EMLA) cream Apply to affected area once 30 g 3   liothyronine (CYTOMEL) 25 MCG tablet Take 25 mcg by mouth daily.     loperamide (IMODIUM) 2 MG capsule Take 2 tabs by mouth with first loose stool, then 1 tab with each additional loose stool as needed. Do not exceed 8 tabs in a 24-hour period 30 capsule 0   losartan (COZAAR) 50 MG tablet Take 50 mg by mouth daily.     MAGNESIUM PO Take 5 mLs by mouth daily. Liquid 2.5 ml/150 mg     Misc Natural Products (BLOOD SUGAR BALANCE PO) Take 3 capsules by mouth daily. Blood Sugar Manager     ondansetron (ZOFRAN) 8 MG tablet Take 1 tablet (8 mg total) by mouth every 8 (eight) hours as needed for nausea or vomiting. Take 1 tablets (8mg  total) by mouth every 8hrs as needed. Start on the third day after chemotherapy. 30 tablet 1   OVER THE COUNTER MEDICATION Take 1 Scoop by mouth daily. Branched chain amino acids 4000  Patient states "taking for muscles"     OVER THE COUNTER MEDICATION Take 400-800 mg by mouth daily as needed (Based on Blood glucose). gymnema sylvestre Patient takes for "lowering blood glucose"     OVER THE COUNTER MEDICATION Take 5 capsules by mouth daily. Pau d' arco 500 mg each  Patient states now taking liquid and "keeps mold down"     OVER THE COUNTER MEDICATION Take 6 capsules by mouth daily. Caprylic acid Patient taking to "lower candida risk"     OVER THE COUNTER MEDICATION Take 5 capsules by mouth daily. Pancreatic enzymes Patient taking "to get fluid out"     OVER THE COUNTER MEDICATION Take 3 capsules by mouth daily. Raw Adrenal Patient taking for "overall energy"     OVER THE COUNTER MEDICATION Take 6 capsules by mouth daily. Beef Pancreas Patient taking for "pancreatic function improvement and lymphatic system improvement"     PANTETHINE PO Take 1-2 tablets by mouth daily as needed (Itching).  Vitamin B5     potassium chloride SA (K-DUR,KLOR-CON) 20 MEQ tablet Take 20 mEq by mouth 2 (two) times daily.     prochlorperazine (COMPAZINE) 10 MG tablet Take 1 tablet (10 mg total) by mouth every 6 (six) hours as needed for nausea or vomiting. 30 tablet 1   PROLENSA 0.07 % SOLN Place 1 drop into the right eye 4 (four) times daily.     Propylene Glycol (SYSTANE BALANCE) 0.6 % SOLN Place 2 drops into the right eye in the morning and at bedtime.     TRUE METRIX BLOOD GLUCOSE TEST test strip SMARTSIG:Via Meter     TRUEplus Lancets 33G MISC      No current facility-administered medications for this visit.    PHYSICAL EXAMINATION: ECOG PERFORMANCE STATUS: 1 - Symptomatic but completely ambulatory  Vitals:   03/14/23 0846  BP: [name])  147/81  Pulse: 93  Resp: 18  Temp: 97.9 F (36.6 C)  SpO2: 100%   Filed Weights   03/14/23 0846  Weight: 133 lb 4.8 oz (60.5 kg)      LABORATORY DATA:  I have reviewed the data as listed    Latest Ref Rng & Units 03/14/2023    8:28 AM 02/27/2023   12:27 PM 02/20/2023   11:59 AM  CMP  Glucose 70 - 99 mg/dL 161  096  045   BUN 8 - 23 mg/dL 14  10  9    Creatinine 0.44 - 1.00 mg/dL 4.09  8.11  9.14   Sodium 135 - 145 mmol/L 138  135  138   Potassium 3.5 - 5.1 mmol/L 3.8  3.7  3.9   Chloride 98 - 111 mmol/L 106  104  105   CO2 22 - 32 mmol/L 24  24  26    Calcium 8.9 - 10.3 mg/dL 8.8  9.1  9.0   Total Protein 6.5 - 8.1 g/dL 6.8  6.3  6.7   Total Bilirubin 0.3 - 1.2 mg/dL 0.5  0.3  0.5   Alkaline Phos 38 - 126 U/L 86  83  96   AST 15 - 41 U/L 14  12  12    ALT 0 - 44 U/L 8  8  6      Lab Results  Component Value Date   WBC 4.7 03/14/2023   HGB 13.2 03/14/2023   HCT 40.0 03/14/2023   MCV 91.3 03/14/2023   PLT 217 03/14/2023   NEUTROABS 2.9 03/14/2023    ASSESSMENT & PLAN:  Malignant neoplasm of upper-outer quadrant of left breast in female, estrogen receptor positive (HCC) 03/22/2020:diffuse left breast swelling. skin thickening and two areas of  irregular hypoechogenicity in the 2-3 o'clock region, 4.9cm, and at least 4 abnormal lymph nodes  with cortical thickening. 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  Neoadjuvant chemotherapy with CMF 2.  09/14/2020:Left mastectomy ([name]): invasive and in situ ductal carcinoma, grade 2, 15cm, clear margins, with 14/14 lymph nodes positive for metastatic carcinoma with extracapsular extension.  ER 30%, PR 10%, HER-2 negative, Ki-67 15% 3.  Adjuvant radiation 10/28/2020-12/17/2020 4.  Followed by adjuvant antiestrogen therapy with anastrozole started 01/11/2021 5.  12/07/2021: Liver metastases: Biopsy consistent with metastatic breast cancer ER 0%, PR 0%, HER2 1+ 6.  Capecitabine 1000 mg p.o. twice daily 2 weeks on 1 week off started 01/11/2022-10/31/2022.  Progression ----------------------------------------------------------------------------------------------------------------------- Current treatment: Trodelvy started 11/20/2022, today is cycle 6 Toxicities: Fatigue is moderate in nature possibly due to lack of interest rather than side effect of treatment Fogginess in the head:  Constipation   Purel induced seizure-like activity: Patient says that he is allergic to purell    CT CAP 01/20/2023: Slight interval decrease in the multiple metastases in the liver.  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    Answer:   Yes    Order Specific Question:   Preferred imaging location?    Answer:   [facility]    Order Specific Question:   If indicated for the ordered procedure, I authorize the administration of oral contrast media per Radiology protocol    Answer:   Yes   The patient has a good understanding of the overall plan. she agrees with it. she will call with any problems that may develop before the next visit here. Total time spent: 30 mins including face to face time and time spent for planning, charting and co-ordination of care   Tamsen Meek, MD 03/14/23    I Janan Ridge am acting as a Neurosurgeon for [employer]  I have reviewed the above documentation for accuracy and completeness, and I agree with the above.

## 2023-03-14 NOTE — Progress Notes (Signed)
Okay to proceed with treatment with phos level pending per Dr. Pamelia Hoit

## 2023-03-14 NOTE — Assessment & Plan Note (Addendum)
03/22/2020:diffuse left breast swelling. skin thickening and two areas of irregular hypoechogenicity in the 2-3 o'clock region, 4.9cm, and at least 4 abnormal lymph nodes  with cortical thickening. 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  Neoadjuvant chemotherapy with CMF 2.  09/14/2020:Left mastectomy ([name]): invasive and in situ ductal carcinoma, grade 2, 15cm, clear margins, with 14/14 lymph nodes positive for metastatic carcinoma with extracapsular extension.  ER 30%, PR 10%, HER-2 negative, Ki-67 15% 3.  Adjuvant radiation 10/28/2020-12/17/2020 4.  Followed by adjuvant antiestrogen therapy with anastrozole started 01/11/2021 5.  12/07/2021: Liver metastases: Biopsy consistent with metastatic breast cancer ER 0%, PR 0%, HER2 1+ 6.  Capecitabine 1000 mg p.o. twice daily 2 weeks on 1 week off started 01/11/2022-10/31/2022.  Progression ----------------------------------------------------------------------------------------------------------------------- Current treatment: Trodelvy started 11/20/2022, today is cycle 6 Toxicities: Severe fatigue Fogginess in the head:  Constipation   Purel induced seizure-like activity: Patient says that he is allergic to purell    CT CAP 01/20/2023: Slight interval decrease in the multiple metastases in the liver.  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

## 2023-03-14 NOTE — Patient Instructions (Signed)
[facility]  Discharge Instructions: Thank you for choosing [facility] to provide your oncology and hematology care.   If you have a lab appointment with the Cancer Center, please go directly to the Cancer Center and check in at the registration area.   Wear comfortable clothing and clothing appropriate for easy access to any Portacath or PICC line.   We strive to give you quality time with your provider. You may need to reschedule your appointment if you arrive late (15 or more minutes).  Arriving late affects you and other patients whose appointments are after yours.  Also, if you miss three or more appointments without notifying the office, you may be dismissed from the clinic at the provider's discretion.      For prescription refill requests, have your pharmacy contact our office and allow 72 hours for refills to be completed.    Today you received the following chemotherapy and/or immunotherapy agents: Trodelvy      To help prevent nausea and vomiting after your treatment, we encourage you to take your nausea medication as directed.  BELOW ARE SYMPTOMS THAT SHOULD BE REPORTED IMMEDIATELY: *FEVER GREATER THAN 100.4 F (38 C) OR HIGHER *CHILLS OR SWEATING *NAUSEA AND VOMITING THAT IS NOT CONTROLLED WITH YOUR NAUSEA MEDICATION *UNUSUAL SHORTNESS OF BREATH *UNUSUAL BRUISING OR BLEEDING *URINARY PROBLEMS (pain or burning when urinating, or frequent urination) *BOWEL PROBLEMS (unusual diarrhea, constipation, pain near the anus) TENDERNESS IN MOUTH AND THROAT WITH OR WITHOUT PRESENCE OF ULCERS (sore throat, sores in mouth, or a toothache) UNUSUAL RASH, SWELLING OR PAIN  UNUSUAL VAGINAL DISCHARGE OR ITCHING   Items with * indicate a potential emergency and should be followed up as soon as possible or go to the Emergency Department if any problems should occur.  Please show the CHEMOTHERAPY ALERT CARD or IMMUNOTHERAPY ALERT CARD at  check-in to the Emergency Department and triage nurse.  Should you have questions after your visit or need to cancel or reschedule your appointment, please contact [facility]  Dept: [phone]  and follow the prompts.  Office hours are 8:00 a.m. to 4:30 p.m. Monday - Friday. Please note that voicemails left after 4:00 p.m. may not be returned until the following business day.  We are closed weekends and major holidays. You have access to a nurse at all times for urgent questions. Please call the main number to the clinic Dept: [phone] and follow the prompts.   For any non-urgent questions, you may also contact your provider using MyChart. We now offer e-Visits for anyone 18 and older to request care online for non-urgent symptoms. For details visit [email or web address].   Also download the MyChart app! Go to the app store, search "MyChart", open the app, select , and log in with your MyChart username and password.   

## 2023-03-20 MED FILL — Dexamethasone Sodium Phosphate Inj 100 MG/10ML: INTRAMUSCULAR | Qty: 1 | Status: AC

## 2023-03-20 MED FILL — Fosaprepitant Dimeglumine For IV Infusion 150 MG (Base Eq): INTRAVENOUS | Qty: 5 | Status: AC

## 2023-03-21 ENCOUNTER — Inpatient Hospital Stay: Payer: Medicare HMO

## 2023-03-21 ENCOUNTER — Observation Stay (HOSPITAL_COMMUNITY)
Admission: EM | Admit: 2023-03-21 | Discharge: 2023-03-22 | Disposition: A | Discharge status: Home / Self Care | Payer: Medicare HMO | Attending: Family Medicine | Admitting: Family Medicine

## 2023-03-21 ENCOUNTER — Inpatient Hospital Stay (HOSPITAL_COMMUNITY): Payer: Medicare HMO

## 2023-03-21 ENCOUNTER — Emergency Department (HOSPITAL_COMMUNITY): Payer: Medicare HMO

## 2023-03-21 ENCOUNTER — Other Ambulatory Visit: Payer: Self-pay

## 2023-03-21 VITALS — BP 182/81 | HR 86 | Temp 97.7°F | Resp 17 | Wt 132.0 lb

## 2023-03-21 DIAGNOSIS — Z79899 Other long term (current) drug therapy: Secondary | ICD-10-CM | POA: Insufficient documentation

## 2023-03-21 DIAGNOSIS — F17210 Nicotine dependence, cigarettes, uncomplicated: Secondary | ICD-10-CM | POA: Diagnosis not present

## 2023-03-21 DIAGNOSIS — E039 Hypothyroidism, unspecified: Secondary | ICD-10-CM | POA: Diagnosis not present

## 2023-03-21 DIAGNOSIS — Z170 Estrogen receptor positive status [ER+]: Secondary | ICD-10-CM

## 2023-03-21 DIAGNOSIS — C50912 Malignant neoplasm of unspecified site of left female breast: Secondary | ICD-10-CM | POA: Diagnosis not present

## 2023-03-21 DIAGNOSIS — G40909 Epilepsy, unspecified, not intractable, without status epilepticus: Principal | ICD-10-CM | POA: Insufficient documentation

## 2023-03-21 DIAGNOSIS — I10 Essential (primary) hypertension: Secondary | ICD-10-CM | POA: Diagnosis present

## 2023-03-21 DIAGNOSIS — Z95810 Presence of automatic (implantable) cardiac defibrillator: Secondary | ICD-10-CM | POA: Insufficient documentation

## 2023-03-21 DIAGNOSIS — G9341 Metabolic encephalopathy: Secondary | ICD-10-CM | POA: Insufficient documentation

## 2023-03-21 DIAGNOSIS — R569 Unspecified convulsions: Secondary | ICD-10-CM | POA: Diagnosis present

## 2023-03-21 DIAGNOSIS — I5041 Acute combined systolic (congestive) and diastolic (congestive) heart failure: Secondary | ICD-10-CM | POA: Insufficient documentation

## 2023-03-21 DIAGNOSIS — E119 Type 2 diabetes mellitus without complications: Secondary | ICD-10-CM | POA: Diagnosis not present

## 2023-03-21 DIAGNOSIS — I110 Hypertensive heart disease with heart failure: Secondary | ICD-10-CM | POA: Insufficient documentation

## 2023-03-21 DIAGNOSIS — C787 Secondary malignant neoplasm of liver and intrahepatic bile duct: Secondary | ICD-10-CM | POA: Insufficient documentation

## 2023-03-21 DIAGNOSIS — Z91040 Latex allergy status: Secondary | ICD-10-CM | POA: Insufficient documentation

## 2023-03-21 DIAGNOSIS — Z95828 Presence of other vascular implants and grafts: Secondary | ICD-10-CM

## 2023-03-21 LAB — I-STAT CHEM 8, ED
BUN: 11 mg/dL (ref 8–23)
Calcium, Ion: 1.14 mmol/L — ABNORMAL LOW (ref 1.15–1.40)
Chloride: 106 mmol/L (ref 98–111)
Creatinine, Ser: 0.7 mg/dL (ref 0.44–1.00)
Glucose, Bld: 272 mg/dL — ABNORMAL HIGH (ref 70–99)
HCT: 41 % (ref 36.0–46.0)
Hemoglobin: 13.9 g/dL (ref 12.0–15.0)
Potassium: 3.6 mmol/L (ref 3.5–5.1)
Sodium: 140 mmol/L (ref 135–145)
TCO2: 16 mmol/L — ABNORMAL LOW (ref 22–32)

## 2023-03-21 LAB — CBC WITH DIFFERENTIAL (CANCER CENTER ONLY)
Abs Immature Granulocytes: 0.08 10*3/uL — ABNORMAL HIGH (ref 0.00–0.07)
Basophils Absolute: 0.1 10*3/uL (ref 0.0–0.1)
Basophils Relative: 1 %
Eosinophils Absolute: 0.1 10*3/uL (ref 0.0–0.5)
Eosinophils Relative: 2 %
HCT: 39.9 % (ref 36.0–46.0)
Hemoglobin: 13.2 g/dL (ref 12.0–15.0)
Immature Granulocytes: 2 %
Lymphocytes Relative: 31 %
Lymphs Abs: 1.4 10*3/uL (ref 0.7–4.0)
MCH: 29.8 pg (ref 26.0–34.0)
MCHC: 33.1 g/dL (ref 30.0–36.0)
MCV: 90.1 fL (ref 80.0–100.0)
Monocytes Absolute: 0.6 10*3/uL (ref 0.1–1.0)
Monocytes Relative: 13 %
Neutro Abs: 2.3 10*3/uL (ref 1.7–7.7)
Neutrophils Relative %: 51 %
Platelet Count: 225 10*3/uL (ref 150–400)
RBC: 4.43 MIL/uL (ref 3.87–5.11)
RDW: 14.6 % (ref 11.5–15.5)
WBC Count: 4.6 10*3/uL (ref 4.0–10.5)
nRBC: 0 % (ref 0.0–0.2)

## 2023-03-21 LAB — CMP (CANCER CENTER ONLY)
ALT: 9 U/L (ref 0–44)
AST: 16 U/L (ref 15–41)
Albumin: 4 g/dL (ref 3.5–5.0)
Alkaline Phosphatase: 93 U/L (ref 38–126)
Anion gap: 9 (ref 5–15)
BUN: 9 mg/dL (ref 8–23)
CO2: 25 mmol/L (ref 22–32)
Calcium: 8.6 mg/dL — ABNORMAL LOW (ref 8.9–10.3)
Chloride: 108 mmol/L (ref 98–111)
Creatinine: 0.77 mg/dL (ref 0.44–1.00)
GFR, Estimated: >60 mL/min (ref >60)
Glucose, Bld: 148 mg/dL — ABNORMAL HIGH (ref 70–99)
Potassium: 3.5 mmol/L (ref 3.5–5.1)
Sodium: 142 mmol/L (ref 135–145)
Total Bilirubin: 0.3 mg/dL (ref 0.3–1.2)
Total Protein: 6.9 g/dL (ref 6.5–8.1)

## 2023-03-21 LAB — CBC
HCT: 40.8 % (ref 36.0–46.0)
Hemoglobin: 13.4 g/dL (ref 12.0–15.0)
MCH: 29.1 pg (ref 26.0–34.0)
MCHC: 32.8 g/dL (ref 30.0–36.0)
MCV: 88.7 fL (ref 80.0–100.0)
Platelets: 203 10*3/uL (ref 150–400)
RBC: 4.6 MIL/uL (ref 3.87–5.11)
RDW: 14.6 % (ref 11.5–15.5)
WBC: 3 10*3/uL — ABNORMAL LOW (ref 4.0–10.5)
nRBC: 0 % (ref 0.0–0.2)

## 2023-03-21 LAB — GLUCOSE, CAPILLARY: Glucose-Capillary: 193 mg/dL — ABNORMAL HIGH (ref 70–99)

## 2023-03-21 LAB — CBC WITH DIFFERENTIAL/PLATELET
Abs Immature Granulocytes: 0.17 10*3/uL — ABNORMAL HIGH (ref 0.00–0.07)
Basophils Absolute: 0 10*3/uL (ref 0.0–0.1)
Basophils Relative: 1 %
Eosinophils Absolute: 0 10*3/uL (ref 0.0–0.5)
Eosinophils Relative: 0 %
HCT: 41.7 % (ref 36.0–46.0)
Hemoglobin: 12.9 g/dL (ref 12.0–15.0)
Immature Granulocytes: 4 %
Lymphocytes Relative: 20 %
Lymphs Abs: 0.9 10*3/uL (ref 0.7–4.0)
MCH: 29.2 pg (ref 26.0–34.0)
MCHC: 30.9 g/dL (ref 30.0–36.0)
MCV: 94.3 fL (ref 80.0–100.0)
Monocytes Absolute: 0.1 10*3/uL (ref 0.1–1.0)
Monocytes Relative: 3 %
Neutro Abs: 3.2 10*3/uL (ref 1.7–7.7)
Neutrophils Relative %: 72 %
Platelets: 220 10*3/uL (ref 150–400)
RBC: 4.42 MIL/uL (ref 3.87–5.11)
RDW: 14.6 % (ref 11.5–15.5)
WBC: 4.4 10*3/uL (ref 4.0–10.5)
nRBC: 0 % (ref 0.0–0.2)

## 2023-03-21 LAB — CBG MONITORING, ED: Glucose-Capillary: 254 mg/dL — ABNORMAL HIGH (ref 70–99)

## 2023-03-21 LAB — BASIC METABOLIC PANEL
Anion gap: 17 — ABNORMAL HIGH (ref 5–15)
BUN: 12 mg/dL (ref 8–23)
CO2: 15 mmol/L — ABNORMAL LOW (ref 22–32)
Calcium: 8.8 mg/dL — ABNORMAL LOW (ref 8.9–10.3)
Chloride: 106 mmol/L (ref 98–111)
Creatinine, Ser: 1.01 mg/dL — ABNORMAL HIGH (ref 0.44–1.00)
GFR, Estimated: 57 mL/min — ABNORMAL LOW (ref >60)
Glucose, Bld: 281 mg/dL — ABNORMAL HIGH (ref 70–99)
Potassium: 3.5 mmol/L (ref 3.5–5.1)
Sodium: 138 mmol/L (ref 135–145)

## 2023-03-21 LAB — HEMOGLOBIN A1C
Hgb A1c MFr Bld: 7.3 % — ABNORMAL HIGH (ref 4.8–5.6)
Mean Plasma Glucose: 162.81 mg/dL

## 2023-03-21 LAB — PHOSPHORUS: Phosphorus: 3.3 mg/dL (ref 2.5–4.6)

## 2023-03-21 LAB — MAGNESIUM
Magnesium: 2 mg/dL (ref 1.7–2.4)
Magnesium: 1.9 mg/dL (ref 1.7–2.4)

## 2023-03-21 MED ORDER — SODIUM CHLORIDE 0.9 % IV SOLN
10.0000 mg | Freq: Once | INTRAVENOUS | Status: AC
  Administered 2023-03-21: 10 mg via INTRAVENOUS
  Filled 2023-03-21: qty 10

## 2023-03-21 MED ORDER — INSULIN ASPART 100 UNIT/ML IJ SOLN
0.0000 [IU] | INTRAMUSCULAR | Status: DC
  Administered 2023-03-21: 2 [IU] via SUBCUTANEOUS
  Administered 2023-03-22: 1 [IU] via SUBCUTANEOUS
  Administered 2023-03-22: 2 [IU] via SUBCUTANEOUS
  Administered 2023-03-22: 3 [IU] via SUBCUTANEOUS

## 2023-03-21 MED ORDER — ENOXAPARIN SODIUM 40 MG/0.4ML IJ SOSY
40.0000 mg | PREFILLED_SYRINGE | INTRAMUSCULAR | Status: DC
  Administered 2023-03-21: 40 mg via SUBCUTANEOUS
  Filled 2023-03-21: qty 0.4

## 2023-03-21 MED ORDER — POLYETHYLENE GLYCOL 3350 17 G PO PACK: 17.0000 g | PACK | Freq: Every day | ORAL | Status: DC | PRN

## 2023-03-21 MED ORDER — SODIUM CHLORIDE 0.9 % IV SOLN
75.0000 mL/h | INTRAVENOUS | Status: DC
  Administered 2023-03-21: 75 mL/h via INTRAVENOUS

## 2023-03-21 MED ORDER — LEVETIRACETAM IN NACL 500 MG/100ML IV SOLN
500.0000 mg | Freq: Two times a day (BID) | INTRAVENOUS | Status: DC
  Administered 2023-03-22: 500 mg via INTRAVENOUS
  Filled 2023-03-21: qty 100

## 2023-03-21 MED ORDER — HYDRALAZINE HCL 20 MG/ML IJ SOLN
10.0000 mg | Freq: Four times a day (QID) | INTRAMUSCULAR | Status: DC | PRN
  Administered 2023-03-21 – 2023-03-22 (×2): 10 mg via INTRAVENOUS
  Filled 2023-03-21 (×2): qty 1

## 2023-03-21 MED ORDER — ACETAMINOPHEN 325 MG PO TABS
650.0000 mg | ORAL_TABLET | Freq: Once | ORAL | Status: AC
  Administered 2023-03-21: 650 mg via ORAL
  Filled 2023-03-21: qty 2

## 2023-03-21 MED ORDER — LEVETIRACETAM IN NACL 1000 MG/100ML IV SOLN
1000.0000 mg | Freq: Once | INTRAVENOUS | Status: AC
  Administered 2023-03-21: 1000 mg via INTRAVENOUS
  Filled 2023-03-21: qty 100

## 2023-03-21 MED ORDER — LORAZEPAM 2 MG/ML IJ SOLN
INTRAMUSCULAR | Status: AC
  Administered 2023-03-21: 2 mg
  Filled 2023-03-21: qty 1

## 2023-03-21 MED ORDER — SODIUM CHLORIDE 0.9 % IV BOLUS
1000.0000 mL | Freq: Once | INTRAVENOUS | Status: AC
  Administered 2023-03-21: 1000 mL via INTRAVENOUS

## 2023-03-21 MED ORDER — LORAZEPAM 2 MG/ML IJ SOLN: 2.0000 mg | INTRAMUSCULAR | Status: DC | PRN

## 2023-03-21 MED ORDER — FAMOTIDINE 20 MG IN NS 100 ML IVPB
20.0000 mg | Freq: Once | INTRAVENOUS | Status: AC
  Administered 2023-03-21: 20 mg via INTRAVENOUS
  Filled 2023-03-21: qty 100

## 2023-03-21 MED ORDER — PALONOSETRON HCL INJECTION 0.25 MG/5ML
0.2500 mg | Freq: Once | INTRAVENOUS | Status: AC
  Administered 2023-03-21: 0.25 mg via INTRAVENOUS
  Filled 2023-03-21: qty 5

## 2023-03-21 MED ORDER — ACETAMINOPHEN 650 MG RE SUPP: 650.0000 mg | RECTAL | Status: DC | PRN

## 2023-03-21 MED ORDER — SODIUM CHLORIDE 0.9% FLUSH
10.0000 mL | INTRAVENOUS | Status: DC | PRN
  Administered 2023-03-21: 10 mL

## 2023-03-21 MED ORDER — HEPARIN SOD (PORK) LOCK FLUSH 100 UNIT/ML IV SOLN
500.0000 [IU] | Freq: Once | INTRAVENOUS | Status: AC | PRN
  Administered 2023-03-21: 500 [IU]

## 2023-03-21 MED ORDER — ONDANSETRON HCL 4 MG/2ML IJ SOLN: 4.0000 mg | Freq: Four times a day (QID) | INTRAMUSCULAR | Status: DC | PRN

## 2023-03-21 MED ORDER — SODIUM CHLORIDE 0.9% FLUSH
10.0000 mL | Freq: Once | INTRAVENOUS | Status: AC
  Administered 2023-03-21: 10 mL

## 2023-03-21 MED ORDER — SODIUM CHLORIDE 0.9 % IV SOLN
6.0000 mg/kg | Freq: Once | INTRAVENOUS | Status: AC
  Administered 2023-03-21: 360 mg via INTRAVENOUS
  Filled 2023-03-21: qty 36

## 2023-03-21 MED ORDER — SODIUM CHLORIDE 0.9 % IV SOLN
150.0000 mg | Freq: Once | INTRAVENOUS | Status: AC
  Administered 2023-03-21: 150 mg via INTRAVENOUS
  Filled 2023-03-21: qty 150

## 2023-03-21 MED ORDER — LABETALOL HCL 5 MG/ML IV SOLN
10.0000 mg | INTRAVENOUS | Status: DC | PRN
  Administered 2023-03-21 – 2023-03-22 (×2): 10 mg via INTRAVENOUS
  Filled 2023-03-21 (×2): qty 4

## 2023-03-21 MED ORDER — POTASSIUM CHLORIDE IN NACL 20-0.9 MEQ/L-% IV SOLN: INTRAVENOUS | Status: DC

## 2023-03-21 MED ORDER — ONDANSETRON HCL 4 MG PO TABS: 4.0000 mg | ORAL_TABLET | Freq: Four times a day (QID) | ORAL | Status: DC | PRN

## 2023-03-21 MED ORDER — SODIUM CHLORIDE 0.9 % IV SOLN: Freq: Once | INTRAVENOUS | Status: AC

## 2023-03-21 MED ORDER — CETIRIZINE HCL 10 MG/ML IV SOLN
10.0000 mg | Freq: Once | INTRAVENOUS | Status: AC
  Administered 2023-03-21: 10 mg via INTRAVENOUS
  Filled 2023-03-21: qty 1

## 2023-03-21 MED ORDER — ATROPINE SULFATE 1 MG/ML IV SOLN
0.5000 mg | Freq: Once | INTRAVENOUS | Status: DC | PRN
  Filled 2023-03-21: qty 1

## 2023-03-21 MED ORDER — ACETAMINOPHEN 325 MG PO TABS: 650.0000 mg | ORAL_TABLET | ORAL | Status: DC | PRN

## 2023-03-21 NOTE — Patient Instructions (Signed)
[facility]  Discharge Instructions: Thank you for choosing [facility] to provide your oncology and hematology care.   If you have a lab appointment with the Cancer Center, please go directly to the Cancer Center and check in at the registration area.   Wear comfortable clothing and clothing appropriate for easy access to any Portacath or PICC line.   We strive to give you quality time with your provider. You may need to reschedule your appointment if you arrive late (15 or more minutes).  Arriving late affects you and other patients whose appointments are after yours.  Also, if you miss three or more appointments without notifying the office, you may be dismissed from the clinic at the provider's discretion.      For prescription refill requests, have your pharmacy contact our office and allow 72 hours for refills to be completed.    Today you received the following chemotherapy and/or immunotherapy agents: Trodelvy      To help prevent nausea and vomiting after your treatment, we encourage you to take your nausea medication as directed.  BELOW ARE SYMPTOMS THAT SHOULD BE REPORTED IMMEDIATELY: *FEVER GREATER THAN 100.4 F (38 C) OR HIGHER *CHILLS OR SWEATING *NAUSEA AND VOMITING THAT IS NOT CONTROLLED WITH YOUR NAUSEA MEDICATION *UNUSUAL SHORTNESS OF BREATH *UNUSUAL BRUISING OR BLEEDING *URINARY PROBLEMS (pain or burning when urinating, or frequent urination) *BOWEL PROBLEMS (unusual diarrhea, constipation, pain near the anus) TENDERNESS IN MOUTH AND THROAT WITH OR WITHOUT PRESENCE OF ULCERS (sore throat, sores in mouth, or a toothache) UNUSUAL RASH, SWELLING OR PAIN  UNUSUAL VAGINAL DISCHARGE OR ITCHING   Items with * indicate a potential emergency and should be followed up as soon as possible or go to the Emergency Department if any problems should occur.  Please show the CHEMOTHERAPY ALERT CARD or IMMUNOTHERAPY ALERT CARD at  check-in to the Emergency Department and triage nurse.  Should you have questions after your visit or need to cancel or reschedule your appointment, please contact [facility]  Dept: [phone]  and follow the prompts.  Office hours are 8:00 a.m. to 4:30 p.m. Monday - Friday. Please note that voicemails left after 4:00 p.m. may not be returned until the following business day.  We are closed weekends and major holidays. You have access to a nurse at all times for urgent questions. Please call the main number to the clinic Dept: [phone] and follow the prompts.   For any non-urgent questions, you may also contact your provider using MyChart. We now offer e-Visits for anyone 18 and older to request care online for non-urgent symptoms. For details visit [email or web address].   Also download the MyChart app! Go to the app store, search "MyChart", open the app, select [place], and log in with your MyChart username and password.   

## 2023-03-21 NOTE — ED Provider Notes (Signed)
[place] EMERGENCY DEPARTMENT AT [facility] Provider Note   CSN: [id number] Arrival date & time: 03/21/23  1558     History  Chief Complaint  Patient presents with   Seizures    Colleen Lowery is a 82 y.o. female.  82 yo F with a chief complaints of seizure-like activity.  This was reported by passerby.  Patient was seen outside of the cancer center in the parking lot having some shaking activity.  It was believed that she was just seen in the cancer center.  She was then brought here for evaluation.  Patient is nonverbal.  Level 5 caveat.   Seizures      Home Medications Prior to Admission medications   Medication Sig Start Date End Date Taking? Authorizing Provider  alendronate (FOSAMAX) 70 MG tablet Take 1 tablet (70 mg total) by mouth once a week. Take with a full glass of water on an empty stomach. 05/24/22   Serena Croissant, MD  Ascorbic Acid (VITAMIN C PO) Take 2,000 mg by mouth daily.    [provider]  B Complex Vitamins (VITAMIN B COMPLEX) TABS Take 2 tablets by mouth daily.    [provider]  B Complex-Folic Acid (B COMPLEX PLUS PO) Take 4 tablets by mouth daily.    [provider]  Beta Carotene (VITAMIN A) 25000 UNIT capsule Take 50,000 Units by mouth daily.    [provider]  CALCIUM PO Take 10 mLs by mouth daily. Liquid 300 MG /5 ML    [provider]  Cholecalciferol (VITAMIN D3) 5000 units CAPS Take 5,000 Units by mouth daily. 2500 units per cap    [provider]  diazepam (VALIUM) 2 MG tablet SMARTSIG:1-2 Tablet(s) By Mouth As Directed 01/10/23   [provider]  Flaxseed, Linseed, (FLAXSEED OIL) 1000 MG CAPS Take 3,000 mg by mouth daily. Taking as needed    [provider]  furosemide (LASIX) 20 MG tablet Take 20 mg by mouth daily.    [provider]  Homeopathic Products (ARNICARE PAIN RELIEF EX) Apply 1 application  topically daily as needed (pain).    [provider]  levETIRAcetam (KEPPRA) 500 MG tablet Take 1 tablet (500 mg total) by mouth 2 (two) times daily. 01/16/23   Windell Norfolk, MD  lidocaine-prilocaine (EMLA) cream Apply to affected area once 01/08/23   Serena Croissant, MD  liothyronine (CYTOMEL) 25 MCG tablet Take 25 mcg by mouth daily. 01/08/23   [provider]  loperamide (IMODIUM) 2 MG capsule Take 2 tabs by mouth with first loose stool, then 1 tab with each additional loose stool as needed. Do not exceed 8 tabs in a 24-hour period 10/31/22   Serena Croissant, MD  losartan (COZAAR) 50 MG tablet Take 50 mg by mouth daily. 12/16/19   [provider]  MAGNESIUM PO Take 5 mLs by mouth daily. Liquid 2.5 ml/150 mg    [provider]  Misc Natural Products (BLOOD SUGAR BALANCE PO) Take 3 capsules by mouth daily. Blood Sugar Manager    [provider]  ondansetron (ZOFRAN) 8 MG tablet Take 1 tablet (8 mg total) by mouth every 8 (eight) hours as needed for nausea or vomiting. Take 1 tablets (8mg  total) by mouth every 8hrs as needed. Start on the third day after chemotherapy. 10/31/22   Serena Croissant, MD  OVER THE COUNTER MEDICATION Take 1 Scoop by mouth daily. Branched chain amino acids 4000  Patient states "taking for muscles"  [provider]  OVER THE COUNTER MEDICATION Take 400-800 mg by mouth daily as needed (Based on Blood glucose). gymnema sylvestre Patient takes for "lowering blood glucose"    [provider]  OVER THE COUNTER MEDICATION Take 5 capsules by mouth daily. Pau d' arco 500 mg each  Patient states now taking liquid and "keeps mold down"    [provider]  OVER THE COUNTER MEDICATION Take 6 capsules by mouth daily. Caprylic acid Patient taking to "lower candida risk"    [provider]  OVER THE COUNTER MEDICATION Take 5 capsules by mouth daily. Pancreatic enzymes Patient taking "to get fluid out"    [provider]  OVER THE COUNTER MEDICATION  Take 3 capsules by mouth daily. Raw Adrenal Patient taking for "overall energy"    [provider]  OVER THE COUNTER MEDICATION Take 6 capsules by mouth daily. Beef Pancreas Patient taking for "pancreatic function improvement and lymphatic system improvement"    [provider]  PANTETHINE PO Take 1-2 tablets by mouth daily as needed (Itching). Vitamin B5    [provider]  potassium chloride SA (K-DUR,KLOR-CON) 20 MEQ tablet Take 20 mEq by mouth 2 (two) times daily.    [provider]  prochlorperazine (COMPAZINE) 10 MG tablet Take 1 tablet (10 mg total) by mouth every 6 (six) hours as needed for nausea or vomiting. 10/31/22   Serena Croissant, MD  PROLENSA 0.07 % SOLN Place 1 drop into the right eye 4 (four) times daily. 08/15/22   [provider]  Propylene Glycol (SYSTANE BALANCE) 0.6 % SOLN Place 2 drops into the right eye in the morning and at bedtime.    [provider]  TRUE METRIX BLOOD GLUCOSE TEST test strip SMARTSIG:Via Meter 09/28/22   [provider]  TRUEplus Lancets 33G MISC  09/28/22   [provider]      Allergies    Alcohol, Contrast media [iodinated contrast media], Iodine, Atorvastatin, Dust mite extract, Latex, Lisinopril, Molds & smuts, and Wound dressing adhesive    Review of Systems   Review of Systems  Neurological:  Positive for seizures.    Physical Exam Updated Vital Signs BP (!) 178/92 (BP Location: Right Arm)   Pulse (!) 116   Temp 99.7 F (37.6 C) (Rectal)   Resp 10   Ht 5\' 2"  (1.575 m)   Wt 59.9 kg   SpO2 98%   BMI 24.15 kg/m  Physical Exam Vitals and nursing note reviewed.  Constitutional:      General: She is not in acute distress.    Appearance: She is well-developed. She is not diaphoretic.  HENT:     Head: Normocephalic and atraumatic.  Eyes:     Pupils: Pupils are equal, round, and reactive to light.  Cardiovascular:     Rate and Rhythm: Normal rate and regular  rhythm.     Heart sounds: No murmur heard.    No friction rub. No gallop.  Pulmonary:     Effort: Pulmonary effort is normal.     Breath sounds: No wheezing or rales.  Abdominal:     General: There is no distension.     Palpations: Abdomen is soft.     Tenderness: There is no abdominal tenderness.  Musculoskeletal:        General: No tenderness.     Cervical back: Normal range of motion and neck supple.  Skin:    General: Skin is warm and dry.  Neurological:  Mental Status: She is alert and oriented to person, place, and time.     Comments: Left-sided gaze deviation.  Moaning.  Withdraws from pain.  Psychiatric:        Behavior: Behavior normal.     ED Results / Procedures / Treatments   Labs (all labs ordered are listed, but only abnormal results are displayed) Labs Reviewed  CBC WITH DIFFERENTIAL/PLATELET - Abnormal; Notable for the following components:      Result Value   Abs Immature Granulocytes 0.17 (*)    All other components within normal limits  BASIC METABOLIC PANEL - Abnormal; Notable for the following components:   CO2 15 (*)    Glucose, Bld 281 (*)    Creatinine, Ser 1.01 (*)    Calcium 8.8 (*)    GFR, Estimated 57 (*)    Anion gap 17 (*)    All other components within normal limits  I-STAT CHEM 8, ED - Abnormal; Notable for the following components:   Glucose, Bld 272 (*)    Calcium, Ion 1.14 (*)    TCO2 16 (*)    All other components within normal limits  CBG MONITORING, ED - Abnormal; Notable for the following components:   Glucose-Capillary 254 (*)    All other components within normal limits  MAGNESIUM    EKG None  Radiology CT Head Wo Contrast  Result Date: 03/21/2023 CLINICAL DATA:  Head trauma, minor. EXAM: CT HEAD WITHOUT CONTRAST TECHNIQUE: Contiguous axial images were obtained from the base of the skull through the vertex without intravenous contrast. RADIATION DOSE REDUCTION: This exam was performed according to the departmental  dose-optimization program which includes automated exposure control, adjustment of the mA and/or kV according to patient size and/or use of iterative reconstruction technique. COMPARISON:  Head CT 12/11/2022. FINDINGS: Brain: No acute intracranial hemorrhage. Old lacunar infarct in the left thalamus is unchanged. Gray-white differentiation is otherwise preserved. No hydrocephalus or extra-axial collection. No mass effect or midline shift. Vascular: No hyperdense vessel or unexpected calcification. Skull: No calvarial fracture or suspicious bone lesion. Skull base is unremarkable. Sinuses/Orbits: Unremarkable. Other: None. IMPRESSION: 1. No evidence of acute intracranial injury. 2. Old left thalamic lacunar infarct. Electronically Signed   By: Orvan Falconer M.D.   On: 03/21/2023 16:56    Procedures .Critical Care  Performed by: Melene Plan, DO Authorized by: Melene Plan, DO   Critical care provider statement:    Critical care time (minutes):  35   Critical care time was exclusive of:  Separately billable procedures and treating other patients   Critical care was time spent personally by me on the following activities:  Development of treatment plan with patient or surrogate, discussions with consultants, evaluation of patient's response to treatment, examination of patient, ordering and review of laboratory studies, ordering and review of radiographic studies, ordering and performing treatments and interventions, pulse oximetry, re-evaluation of patient's condition and review of old charts   Care discussed with: admitting provider       Medications Ordered in ED Medications  LORazepam (ATIVAN) 2 MG/ML injection (2 mg  Given 03/21/23 1602)  levETIRAcetam (KEPPRA) IVPB 1000 mg/100 mL premix (0 mg Intravenous Stopped 03/21/23 1720)  sodium chloride 0.9 % bolus 1,000 mL (0 mLs Intravenous Stopped 03/21/23 1720)    ED Course/ Medical Decision Making/ A&P                             Medical Decision  Making Amount and/or Complexity of Data Reviewed Labs: ordered. Radiology: ordered.  Risk Prescription drug management. Decision regarding hospitalization.   82 yo F with a chief complaints of reported seizure-like activity.  She was seen in the parking lot on the ground doing this.  She was then brought to the emergency department for evaluation.  Patient arrived with left-sided gaze deviation.  Has had some improvement with 2 mg of Ativan.  Will obtain a CT of the head.  Blood work.  Loaded on Keppra.  On my record review the patient had a history of something similar immediately after having a cancer therapy.  At that point she was quickly back to baseline and was started on Keppra.  Patient reassessed with some recurrent left-sided gaze deviation.  I discussed this with Dr. Amada Jupiter and in the time of my phone call she actually is now responding to simple commands.  Still not at baseline.  He recommends hospitalist admission over at [facility].  Will discuss with hospitalist.  The patients results and plan were reviewed and discussed.   Any x-rays performed were independently reviewed by myself.   Differential diagnosis were considered with the presenting HPI.  Medications  LORazepam (ATIVAN) 2 MG/ML injection (2 mg  Given 03/21/23 1602)  levETIRAcetam (KEPPRA) IVPB 1000 mg/100 mL premix (0 mg Intravenous Stopped 03/21/23 1720)  sodium chloride 0.9 % bolus 1,000 mL (0 mLs Intravenous Stopped 03/21/23 1720)    Vitals:   03/21/23 1609 03/21/23 1610 03/21/23 1730  BP: (!) 190/96  (!) 178/92  Pulse: (!) 114  (!) 116  Resp: 13  10  Temp: 99.7 F (37.6 C)    TempSrc: Rectal    SpO2: 96%  98%  Weight:  59.9 kg   Height:  5\' 2"  (1.575 m)     Final diagnoses:  Seizure (HCC)    Admission/ observation were discussed with the admitting physician, patient and/or family and they are comfortable with the plan.          Final Clinical Impression(s) / ED Diagnoses Final diagnoses:   Seizure [facility])    Rx / DC Orders ED Discharge Orders     None         Melene Plan, DO 03/21/23 1752

## 2023-03-21 NOTE — ED Triage Notes (Signed)
Patient found in hospital parking lot. Seizure like activity reported by staff. Patient responds to pain on arrival. Patient non verbal and unable to answer questions

## 2023-03-21 NOTE — H&P (Signed)
History and Physical    Patient: Colleen Lowery:[id number] DOB: 1941-07-10 DOA: 03/21/2023 DOS: the patient was seen and examined on 03/21/2023 PCP: Dortha Kern, MD  Patient coming from: Home/[facility]  Chief Complaint: Seizure activity Chief Complaint  Patient presents with   Seizures   HPI: Colleen Lowery is a 82 y.o. 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  Apparently patient just completed a chemotherapy infusion.  She has a history of recurrent seizures, apparently related to postchemotherapy infusions.  No family present at bedside at time of admission, patient unable to participate due to her current mental status; and history obtained from chart review and discussion with the ED physician.  In the ED, temperature 99.7 F, HR 114, RR 13, BP 190/76, SpO2 96% on room air.  WBC 4.4, hemoglobin 12.9, platelets 220.  Sodium 138, potassium 3.5, chloride 106, CO2 15, glucose 281, BUN 12, creatinine 1.01.  Anion gap 17.  Magnesium 1.9.  CT head without contrast with no evidence of acute intracranial injury, old left thalamic lacunar infarct.  Patient was given 2 mg IV Ativan and loaded on 1 g IV Keppra.  Neurology was consulted who recommended MR brain with and without contrast; EEG and transferred to [facility] for for continued evaluation and care.  TRH consulted for admission.   Review of Systems: As mentioned in the history of present illness. All other systems reviewed and are negative. Past Medical History:  Diagnosis Date   AICD (automatic cardioverter/defibrillator) present    Allergy    Arthritis    FINGERS   Cancer (HCC)    left breast   Cataract    right   Crohn disease  (HCC)    Depression    Diabetes mellitus without complication (HCC)    TYPE 2 , TAKING OTC MED FORGYMEMA   GERD (gastroesophageal reflux disease)    Headache    sinus   Heart failure, acute, systolic and diastolic (HCC) MARCH 2017 DX   History of colonic polyps    Hypertension    Hypothyroidism    per patient, takes OTC meds when temperature drops low   IBS (irritable bowel syndrome)    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   Seizures (HCC)    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, 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   Thyroid disease    AGE 24'S UNTIL  2016 OFF ALL THRYOID MEDS NOW    Wears dentures    Wears glasses    Past Surgical History:  Procedure Laterality Date   ABDOMINAL HYSTERECTOMY     PARTIAL   BREAST BIOPSY Left 04/01/2019   INVASIVE DUCTAL CARCINOMA   BREAST SURGERY     CARDIAC CATHETERIZATION  02/10/2016   Procedure: Right/Left Heart Cath and Coronary Angiography;  Surgeon: Marykay Lex, MD;  Location: [facility] INVASIVE CV LAB;  Service: Cardiovascular;;   CATARACT EXTRACTION W/ INTRAOCULAR LENS IMPLANT     left   COLONOSCOPY WITH PROPOFOL N/A 02/16/2017   Procedure: COLONOSCOPY WITH PROPOFOL;  Surgeon: Jeani Hawking, MD;  Location: [facility];  Service: Endoscopy;  Laterality: N/A;   COLONOSCOPY WITH PROPOFOL N/A 08/12/2021   Procedure: COLONOSCOPY WITH PROPOFOL;  Surgeon: Jeani Hawking, MD;  Location: [facility]OS[facility];  Service: Endoscopy;  Laterality: N/A;   EP IMPLANTABLE DEVICE N/A 02/10/2016   Procedure: ICD Implant;  Surgeon: Will Jorja Loa, MD;  Location: [facility] INVASIVE CV LAB;  Service: Cardiovascular;  Laterality: N/A;   ESOPHAGOGASTRODUODENOSCOPY (EGD) WITH PROPOFOL N/A 02/16/2017   Procedure: ESOPHAGOGASTRODUODENOSCOPY (EGD) WITH PROPOFOL;  Surgeon: Jeani Hawking, MD;  Location: [facility];  Service: Endoscopy;  Laterality: N/A;  reflux   IR IMAGING GUIDED PORT INSERTION  04/27/2020   IR IMAGING GUIDED PORT INSERTION  11/17/2022   MASTECTOMY Left    MASTECTOMY MODIFIED RADICAL  Left 09/14/2020   Procedure: LEFT MASTECTOMY MODIFIED RADICAL;  Surgeon: Harriette Bouillon, MD;  Location: [facility] OR;  Service: General;  Laterality: Left;  PEC BLOCK   POLYPECTOMY  08/12/2021   Procedure: POLYPECTOMY;  Surgeon: Jeani Hawking, MD;  Location: [facility];  Service: Endoscopy;;   TONSILLECTOMY     Social History:  reports that she has been smoking cigarettes. She has a 12.50 pack-year smoking history. She has never used smokeless tobacco. She reports that she does not drink alcohol and does not use drugs.  Allergies  Allergen Reactions   Alcohol Anaphylaxis and Swelling    Purell hand sanitizer  Swelling in the luNGS, CHEST AND THROAT and seizures Problem with smell not the Alcohol Other reaction(s): Other (See Comments) Causes seizures and swelling   Contrast Media [Iodinated Contrast Media] Anaphylaxis    Throat closed, was 40 yrs ago  *Iodine    Iodine Anaphylaxis    Throat closed, was 40 yrs ago   Atorvastatin     Myalgia    Dust Mite Extract Swelling    SWELLING IS GI SYSTEM   Latex     QUESTIONABLE INTOLERABLE MUCOUS OOZING OUT OF THROAT   Lisinopril Other (See Comments)    Pt does not remember reaction    Molds & Smuts Swelling    SWELLING IS IN GI SYSTEM   Wound Dressing Adhesive Other (See Comments)    Causes skin to tear    Family History  Problem Relation Age of Onset   Diabetes Mother    Stroke Mother    Diabetes Brother    Diabetes Maternal Grandmother    Diabetes Paternal Grandfather    Heart failure Paternal Grandfather    Hypertension Brother    Heart attack Neg Hx     Prior to Admission medications   Medication Sig Start Date End Date Taking? Authorizing Provider  alendronate (FOSAMAX) 70 MG tablet Take 1 tablet (70 mg total) by mouth once a week. Take with a full glass of water on an empty stomach. 05/24/22   Serena Croissant, MD  Ascorbic Acid (VITAMIN C PO) Take 2,000 mg by mouth daily.    [provider]  B Complex Vitamins  (VITAMIN B COMPLEX) TABS Take 2 tablets by mouth daily.    [provider]  B Complex-Folic Acid (B COMPLEX PLUS PO) Take 4 tablets by mouth daily.    [provider]  Beta Carotene (VITAMIN A) 25000 UNIT capsule Take 50,000 Units by mouth daily.    [provider]  CALCIUM PO Take 10 mLs by mouth daily. Liquid 300 MG /5 ML    [provider]  Cholecalciferol (VITAMIN D3) 5000 units CAPS Take 5,000 Units by mouth daily. 2500 units per cap    [provider]  diazepam (VALIUM) 2 MG tablet SMARTSIG:1-2 Tablet(s) By Mouth As Directed 01/10/23   [provider]  Flaxseed, Linseed, (FLAXSEED OIL) 1000 MG CAPS Take 3,000 mg by  mouth daily. Taking as needed    [provider]  furosemide (LASIX) 20 MG tablet Take 20 mg by mouth daily.    [provider]  Homeopathic Products (ARNICARE PAIN RELIEF EX) Apply 1 application  topically daily as needed (pain).    [provider]  levETIRAcetam (KEPPRA) 500 MG tablet Take 1 tablet (500 mg total) by mouth 2 (two) times daily. 01/16/23   Windell Norfolk, MD  lidocaine-prilocaine (EMLA) cream Apply to affected area once 01/08/23   Serena Croissant, MD  liothyronine (CYTOMEL) 25 MCG tablet Take 25 mcg by mouth daily. 01/08/23   [provider]  loperamide (IMODIUM) 2 MG capsule Take 2 tabs by mouth with first loose stool, then 1 tab with each additional loose stool as needed. Do not exceed 8 tabs in a 24-hour period 10/31/22   Serena Croissant, MD  losartan (COZAAR) 50 MG tablet Take 50 mg by mouth daily. 12/16/19   [provider]  MAGNESIUM PO Take 5 mLs by mouth daily. Liquid 2.5 ml/150 mg    [provider]  Misc Natural Products (BLOOD SUGAR BALANCE PO) Take 3 capsules by mouth daily. Blood Sugar Manager    [provider]  ondansetron (ZOFRAN) 8 MG tablet Take 1 tablet (8 mg total) by mouth every 8 (eight) hours as needed for nausea or vomiting. Take 1  tablets (8mg  total) by mouth every 8hrs as needed. Start on the third day after chemotherapy. 10/31/22   Serena Croissant, MD  OVER THE COUNTER MEDICATION Take 1 Scoop by mouth daily. Branched chain amino acids 4000  Patient states "taking for muscles"    [provider]  OVER THE COUNTER MEDICATION Take 400-800 mg by mouth daily as needed (Based on Blood glucose). gymnema sylvestre Patient takes for "lowering blood glucose"    [provider]  OVER THE COUNTER MEDICATION Take 5 capsules by mouth daily. Pau d' arco 500 mg each  Patient states now taking liquid and "keeps mold down"    [provider]  OVER THE COUNTER MEDICATION Take 6 capsules by mouth daily. Caprylic acid Patient taking to "lower candida risk"    [provider]  OVER THE COUNTER MEDICATION Take 5 capsules by mouth daily. Pancreatic enzymes Patient taking "to get fluid out"    [provider]  OVER THE COUNTER MEDICATION Take 3 capsules by mouth daily. Raw Adrenal Patient taking for "overall energy"    [provider]  OVER THE COUNTER MEDICATION Take 6 capsules by mouth daily. Beef Pancreas Patient taking for "pancreatic function improvement and lymphatic system improvement"    [provider]  PANTETHINE PO Take 1-2 tablets by mouth daily as needed (Itching). Vitamin B5    [provider]  potassium chloride SA (K-DUR,KLOR-CON) 20 MEQ tablet Take 20 mEq by mouth 2 (two) times daily.    [provider]  prochlorperazine (COMPAZINE) 10 MG tablet Take 1 tablet (10 mg total) by mouth every 6 (six) hours as needed for nausea or vomiting. 10/31/22   Serena Croissant, MD  PROLENSA 0.07 % SOLN Place 1 drop into the right eye 4 (four) times daily. 08/15/22   [provider]  Propylene Glycol (SYSTANE BALANCE) 0.6 % SOLN Place 2 drops into the right eye in the morning and at bedtime.    [provider]  TRUE METRIX BLOOD GLUCOSE TEST test  strip SMARTSIG:Via Meter 09/28/22   [provider]  TRUEplus Lancets 33G MISC  09/28/22  [provider]    Physical Exam: Vitals:   03/21/23 1609 03/21/23 1610 03/21/23 1730  BP: (!) 190/96  (!) 178/92  Pulse: (!) 114  (!) 116  Resp: 13  10  Temp: 99.7 F (37.6 C)    TempSrc: Rectal    SpO2: 96%  98%  Weight:  59.9 kg   Height:  5\' 2"  (1.575 m)    GEN: 82 yo female in NAD, poorly responsive, confused, chronically ill/elderly appearance HEENT: NCAT, PERRL, EOMI but with tendency towards leftward gaze, sclera clear, dry mucous membranes PULM: CTAB w/o wheezes/crackles, normal respiratory effort, on room air CV: Tachycardic, regular rhythm w/o M/G/R GI: abd soft, NTND, NABS MSK: no peripheral edema NEURO: Eyes with slight left gaze defect, moving all extremities independently but not currently appropriate to command, unable to obtain any further given her current mental status   Assessment and Plan:  Acute metabolic encephalopathy Concern for seizure, recurrent Patient presented to [facility], ED after being found outside on the bench slumped over with apparent shaking activity concerning for seizure activity.  Patient with history of recurrent seizure events following chemotherapy infusion, which she was receiving earlier in the day.  Patient was given IV Ativan followed by loading on Keppra.  CT head without contrast with no acute intracranial injury.  Neurology was consulted with recommendation of EEG, MRI brain with and without contrast and transfer to [facility] for [facility] evaluation. -- Admit to telemetry, [facility] -- Neurology consulted -- MR brain with and without contrast: Pending -- EEG: Pending -- Ativan 2 mg IV as needed for recurrent seizure activity -- Keppra 500 mg IV every 12 hours -- Seizure precautions -- N.p.o. until mental status improves, SLP evaluation in the a.m.  Breast cancer ER positive with liver metastasis Follows with medical  oncology, Dr. Pamelia Hoit outpatient. Currently on chemotherapy with Drinda Butts, most recent infusion 03/21/2023.  Also follows with palliative care outpatient, currently DNR and MOST/DNR form reviewed under ACP documentation.  Type 2 diabetes mellitus Not on insulin or oral hypoglycemics outpatient.  Elevated glucose on admission, 272. -- sensitive ISS for coverage -- CBGs q4h while NPO  Essential hypertension -- Hydralazine/labetalol IV as needed for SBP greater than 165  Hypothyroidism Check TSH/free T4.  Await pharmacy reconciliation, and will need improvement in mental status in order to tolerate oral medications before restarting.   Advance Care Planning:   Code Status: DNR reviewed ACP documents to include MOST form and goal to DNR form  Consults: Neurology; oncology, Dr. Pamelia Hoit added to treatment team  Family Communication: No family at bedside  Severity of Illness: The appropriate patient status for this patient is INPATIENT. Inpatient status is judged to be reasonable and necessary in order to provide the required intensity of service to ensure the patient's safety. The patient's presenting symptoms, physical exam findings, and initial radiographic and laboratory data in the context of their chronic comorbidities is felt to place them at high risk for further clinical deterioration. Furthermore, it is not anticipated that the patient will be medically stable for discharge from the hospital within 2 midnights of admission.   * I certify that at the point of admission it is my clinical judgment that the patient will require inpatient hospital care spanning beyond 2 midnights from the point of admission due to high intensity of service, high risk for further deterioration and high frequency of surveillance required.*  Author: Alvira Philips [place], DO 03/21/2023 5:48 PM  For on call review [email or web address].

## 2023-03-21 NOTE — Consult Note (Signed)
Neurology Consultation    Reason for Consult:Seizure activity  CC: Seizure activity   HISTORY OF PRESENT ILLNESS   HPI   Colleen Lowery is a 82 y.o. female with a past medical history of AICD, left breast cancer, liver mets, Crohns, diabetes, heart failure, seizure , and thyroid disease who was brought to the [facility] ED after she was found seizing in the cancer center parking lot (unconfirmed if she received chemo today). She was given 2mg  of ativan and loaded with 1g of keppra. On my exam she is non verbal, but arouses with noxious stimuli, she is not following commands for me consistently, but she tracks bilaterally.   She has a documented history of seizures that were triggered by noxious smells in 2017 and 2021. Additionally she has had as  episodes of seizures after chemotherapy Drinda Butts) this year. Keppra was resumed in February at 500mg  BID. She followed up with Dr. Teresa Coombs at [facility]. She had an EEG done on 02/01/2023 that was normal. The episodes happened on January 29th and February 6th respectively and are described as tonic clonic seizures.  Current AEDS: Keppra 500mg  BID, Valium 2mg  PRN Potential Exacerbating factors: 2017- strong smells, 2024- chemotherapy   History is obtained from:Chart review, RN     ROS: Review of systems not obtained due to patient factors.   PAST MEDICAL HISTORY    Past Medical History:  Past Medical History:  Diagnosis Date   AICD (automatic cardioverter/defibrillator) present    Allergy    Arthritis    FINGERS   Cancer (HCC)    left breast   Cataract    right   Crohn disease (HCC)    Depression    Diabetes mellitus without complication (HCC)    TYPE 2 , TAKING OTC MED FORGYMEMA   GERD (gastroesophageal reflux disease)    Headache    sinus   Heart failure, acute, systolic and diastolic (HCC) MARCH 2017 DX   History of colonic polyps    Hypertension    Hypothyroidism    per patient, takes OTC meds when temperature drops low   IBS (irritable  bowel syndrome)    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   Seizures (HCC)    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, 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   Thyroid disease    AGE 82'S UNTIL  2016 OFF ALL THRYOID MEDS NOW    Wears dentures    Wears glasses     No family history on file. Family History  Problem Relation Age of Onset   Diabetes Mother    Stroke Mother    Diabetes Brother    Diabetes Maternal Grandmother    Diabetes Paternal Grandfather    Heart failure Paternal Grandfather    Hypertension Brother    Heart attack Neg Hx     Allergies:  Allergies  Allergen Reactions   Alcohol Anaphylaxis and Swelling    Purell hand sanitizer  Swelling in the luNGS, CHEST AND THROAT and seizures Problem with smell not the Alcohol Other reaction(s): Other (See Comments) Causes seizures and swelling   Contrast Media [Iodinated Contrast Media] Anaphylaxis    Throat closed, was 40 yrs ago  *Iodine    Iodine Anaphylaxis    Throat closed, was 40 yrs ago   Atorvastatin     Myalgia    Dust Mite Extract Swelling    SWELLING IS GI SYSTEM   Latex     QUESTIONABLE INTOLERABLE MUCOUS OOZING OUT OF THROAT   Lisinopril Other (See  Comments)    Pt does not remember reaction    Molds & Smuts Swelling    SWELLING IS IN GI SYSTEM   Wound Dressing Adhesive Other (See Comments)    Causes skin to tear    Social History:   reports that she has been smoking cigarettes. She has a 12.50 pack-year smoking history. She has never used smokeless tobacco. She reports that she does not drink alcohol and does not use drugs.    Medications (Not in a hospital admission)   EXAMINATION    Current vital signs:    03/21/2023    4:10 PM 03/21/2023    4:09 PM 03/21/2023    3:07 PM  Vitals with BMI  Height 5\' 2"     Weight 132 lbs 1 oz    BMI 24.15    Systolic  190 182  Diastolic  96 81  Pulse  114 86    Examination:  GENERAL: Drowsy, eyes closed, NAD HEENT: - Normocephalic and atraumatic, dry mm, no lymphadenopathy, no  Thyromegally LUNGS - Clear to auscultation bilaterally CV - S1S2 RRR, equal pulses bilaterally. ABDOMEN - Soft, nontender, nondistended with normoactive BS Ext: warm, well perfused, intact peripheral pulses, no pedal edema  NEURO:  Mental Status: Drowsy, awakens with verbal stimuli. Makes eye contact, but does not speak or follow commands consistently Falls asleep throughout the exam Cranial Nerves:  II: PERRL. Visual fields full to confrontation.  III, IV, VI: EOM intact, resists forced eye opening V: Unable to assess VII: no facial asymmetry   VIII: hearing intact to voice IX, X: cough and gag intact YQ:MVHQ is midline XII: tongue is midline without fasciculations. Motor: Moves extremities purposefully. Localizes to painful stimuli.  Tone: tone is slightly increased, but she seem to resist passive ROM DTRs: 2+ and symmetrical in her uppers, 1+ in the lowers Sensation- localizes to painful stimuli Coordination: unable to complete Gait- deferred    LABS   I have reviewed labs in epic and the results pertinent to this consultation are:  No results found for: "[facility]" Lab Results  Component Value Date   ALT 9 03/21/2023   AST 16 03/21/2023   ALKPHOS 93 03/21/2023   BILITOT 0.3 03/21/2023   Lab Results  Component Value Date   HGBA1C 6.6 (H) 09/06/2020   Lab Results  Component Value Date   WBC 4.4 03/21/2023   HGB 13.9 03/21/2023   HCT 41.0 03/21/2023   MCV 94.3 03/21/2023   PLT 220 03/21/2023   No results found for: "VITAMINB12" No results found for: "FOLATE" Lab Results  Component Value Date   NA 140 03/21/2023   K 3.6 03/21/2023   CL 106 03/21/2023   CO2 15 (L) 03/21/2023     DIAGNOSTIC IMAGING/PROCEDURES   I have reviewed the images obtained:, as below    CT-head 1. No evidence of acute intracranial injury. 2. Old left thalamic lacunar infarct.  ASSESSMENT/PLAN    Assessment:  82 y.o. female with a pertinent medical history of AICD, left  breast cancer, liver mets, Crohns, diabetes, heart failure, seizure , and thyroid disease who was brought to the [facility] ED after she was found seizing in the cancer center parking lot. She was given 2mg  of ativan and loaded with 1g of keppra. On my exam she is non verbal, but arouses with noxious stimuli, she is not following commands for me consistently, but she tracks bilaterally.   She has a documented history of seizures that were triggered by noxious smells  in 2017 and 2021. Additionally she has had as  episodes of seizures after chemotherapy Drinda Butts) this year. Keppra was resumed in February at 500mg  BID. She followed up with Dr. Teresa Coombs at [facility]. She had an EEG done on 02/01/2023 that was normal. The episodes happened on January 29th and February 6th respectively and are described as tonic clonic seizures.  Current AEDS: Keppra 500mg  BID, Valium 2mg  PRN Potential Exacerbating factors: 2017- strong smells, 2024- chemotherapy   Impression:Breakthrough seizure   Recommendations: - increaed keppra to 750mg  BID - Seizure precautions    -- Patient seen and examined by NP/APP with MD. MD to update note as needed.   Elmer Picker, DNP, FNP-BC [facility] Pager: [phone]   **This documentation was dictated using Dragon Medical Software and may contain inadvertent errors **  NEUROHOSPITALIST ADDENDUM Performed a face to face diagnostic evaluation.   I have reviewed the contents of history and physical exam as documented by PA/ARNP/Resident and agree with above documentation.  I have discussed and formulated the above plan as documented. Edits to the note have been made as needed.  Impression/Key exam findings/Plan: awake and alert noe and back to her baseline. she is compliant with keppra she reports and still hada seizure.  Will increase her Keppra to 750 BID.Since she is now back to her baseline, LTM EEG is not warranted.   Erick Blinks, MD [facility] [phone]   If 7pm to 7am, please call on call as listed on AMION.

## 2023-03-22 ENCOUNTER — Other Ambulatory Visit (HOSPITAL_COMMUNITY): Payer: Self-pay

## 2023-03-22 ENCOUNTER — Inpatient Hospital Stay (HOSPITAL_COMMUNITY): Payer: Medicare HMO

## 2023-03-22 ENCOUNTER — Observation Stay (HOSPITAL_COMMUNITY): Payer: Medicare HMO

## 2023-03-22 DIAGNOSIS — R569 Unspecified convulsions: Secondary | ICD-10-CM | POA: Diagnosis not present

## 2023-03-22 LAB — GLUCOSE, CAPILLARY
Glucose-Capillary: 172 mg/dL — ABNORMAL HIGH (ref 70–99)
Glucose-Capillary: 133 mg/dL — ABNORMAL HIGH (ref 70–99)
Glucose-Capillary: 151 mg/dL — ABNORMAL HIGH (ref 70–99)
Glucose-Capillary: 230 mg/dL — ABNORMAL HIGH (ref 70–99)

## 2023-03-22 LAB — TSH: TSH: 0.193 u[IU]/mL — ABNORMAL LOW (ref 0.350–4.500)

## 2023-03-22 LAB — COMPREHENSIVE METABOLIC PANEL
ALT: 14 U/L (ref 0–44)
AST: 21 U/L (ref 15–41)
Albumin: 3.3 g/dL — ABNORMAL LOW (ref 3.5–5.0)
Alkaline Phosphatase: 80 U/L (ref 38–126)
Anion gap: 10 (ref 5–15)
BUN: 8 mg/dL (ref 8–23)
CO2: 21 mmol/L — ABNORMAL LOW (ref 22–32)
Calcium: 8.6 mg/dL — ABNORMAL LOW (ref 8.9–10.3)
Chloride: 108 mmol/L (ref 98–111)
Creatinine, Ser: 0.7 mg/dL (ref 0.44–1.00)
GFR, Estimated: >60 mL/min (ref >60)
Glucose, Bld: 148 mg/dL — ABNORMAL HIGH (ref 70–99)
Potassium: 3.4 mmol/L — ABNORMAL LOW (ref 3.5–5.1)
Sodium: 139 mmol/L (ref 135–145)
Total Bilirubin: 0.5 mg/dL (ref 0.3–1.2)
Total Protein: 6.2 g/dL — ABNORMAL LOW (ref 6.5–8.1)

## 2023-03-22 LAB — CBC
HCT: 37.3 % (ref 36.0–46.0)
Hemoglobin: 12.9 g/dL (ref 12.0–15.0)
MCH: 29.9 pg (ref 26.0–34.0)
MCHC: 34.6 g/dL (ref 30.0–36.0)
MCV: 86.3 fL (ref 80.0–100.0)
Platelets: 199 10*3/uL (ref 150–400)
RBC: 4.32 MIL/uL (ref 3.87–5.11)
RDW: 14.6 % (ref 11.5–15.5)
WBC: 5.1 10*3/uL (ref 4.0–10.5)
nRBC: 0 % (ref 0.0–0.2)

## 2023-03-22 LAB — T4, FREE: Free T4: 0.68 ng/dL (ref 0.61–1.12)

## 2023-03-22 LAB — CREATININE, SERUM
Creatinine, Ser: 0.76 mg/dL (ref 0.44–1.00)
GFR, Estimated: >60 mL/min (ref >60)

## 2023-03-22 LAB — MAGNESIUM: Magnesium: 2 mg/dL (ref 1.7–2.4)

## 2023-03-22 MED ORDER — LEVETIRACETAM 750 MG PO TABS
750.0000 mg | ORAL_TABLET | Freq: Two times a day (BID) | ORAL | 0 refills | Status: DC
  Filled 2023-03-22: qty 60, 30d supply, fill #0

## 2023-03-22 MED ORDER — SODIUM CHLORIDE 0.9 % IV SOLN
750.0000 mg | Freq: Two times a day (BID) | INTRAVENOUS | Status: DC
  Filled 2023-03-22 (×2): qty 7.5

## 2023-03-22 NOTE — Progress Notes (Signed)
This nurse messaged provider to ask if patient needed MRI prior to discharge from [facility]. Provider responded, "not needed anymore."  Patient prepared to discharged with TOC meds, discharge summary provided, and transportation called via taxi.

## 2023-03-22 NOTE — Progress Notes (Signed)
Since she is back to her baseline, she can be discharged with outpatient neurology follow up. We will signoff. Please feel free to contact us with any questions or concerns.  Please see my full consult note from yesterday evening for AED changes.  Erick Blinks [facility] Pager Number [phone]

## 2023-03-22 NOTE — TOC Initial Note (Signed)
Transition of Care [facility]) - Initial/Assessment Note    Patient Details  Name: Colleen Lowery MRN: [id number] Date of Birth: [birth year]  Transition of Care [facility]) CM/SW Contact:    Janae Bridgeman, RN Phone Number: 03/22/2023, 10:26 AM  Clinical Narrative:                 CM met with the patient at the bedside prior to her discharge to home.  The patient was admitted from [facility] after patient developed seizures after chemotherapy.  The patient plans to discharge to home today.  Medicare Observation letter provided to the patient and Code 44 completed at the request of the Utilization Review RN.  The patient has no needs at this time prior to disposition.  Patient plans to call familyv to provide transportation to home.  Expected Discharge Plan: Home/Self Care Barriers to Discharge: No Barriers Identified   Patient Goals and CMS Choice Patient states their goals for this hospitalization and ongoing recovery are:: To return home CMS Medicare.gov Compare Post Acute Care list provided to:: Patient Choice offered to / list presented to : Patient Reserve ownership interest in [facility].provided to:: Patient    Expected Discharge Plan and Services   Discharge Planning Services: CM Consult Post Acute Care Choice:  (Cane, Right chest port-a-cath present for current Chemotherapy) Living arrangements for the past 2 months: Apartment Expected Discharge Date: 03/22/23                                    Prior Living Arrangements/Services Living arrangements for the past 2 months: Apartment Lives with:: Self Patient language and need for interpreter reviewed:: Yes Do you feel safe going back to the place where you live?: Yes      Need for Family Participation in Patient Care: Yes (Comment) Care giver support system in place?: Yes (comment) Current home services: DME (Cane, Right chest port-a-cath present) Criminal Activity/Legal Involvement Pertinent  to Current Situation/Hospitalization: No - Comment as needed  Activities of Daily Living      Permission Sought/Granted Permission sought to share information with : Case Manager, Family Supports Permission granted to share information with : Yes, Verbal Permission Granted              Emotional Assessment Appearance:: Appears stated age Attitude/Demeanor/Rapport: Gracious Affect (typically observed): Accepting Orientation: : Oriented to Self, Oriented to Place, Oriented to  Time, Oriented to Situation Alcohol / Substance Use: Tobacco Use (Patient provided with smoking cessation information - but patient declined and plans to continue smoking) Psych Involvement: No (comment)  Admission diagnosis:  Seizure (HCC) [R56.9] Patient Active Problem List   Diagnosis Date Noted   Seizure (HCC) 03/21/2023   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   Acid reflux 02/09/2021   Acute gastritis 02/09/2021   Port-A-Cath in place 05/31/2020   Tobacco use 04/16/2020   Malignant neoplasm of upper-outer quadrant of left breast in female, estrogen receptor positive (HCC) 04/07/2020   Hyperlipidemia 10/11/2017   Presence of automatic (implantable) cardiac defibrillator 10/10/2016   Peripheral arterial disease (HCC) 06/14/2016   Upper airway cough syndrome 06/08/2016   CAD in native artery 05/31/2016   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/2016   VT (ventricular tachycardia) (HCC) 02/09/2016   Acute on chronic combined systolic and diastolic HF (heart failure) (HCC) 02/09/2016   Contrast media allergy 02/09/2016   Dyspnea  02/07/2016   Benign essential HTN 02/07/2016   Cigarette smoker 02/07/2016   Diabetes (HCC) 08/02/2014   Essential hypertension 08/02/2014   PCP:  Dortha Kern, MD Pharmacy:   [facility] [phone] - [place], [place] - [address] AT [facility] OF [address] [place]  [address] Phone: [phone] Fax: [phone]  [facility] (Now [facility]) - [address] [place], [place] - [address] [place] [address] Phone: [phone] Fax: [phone]  [place] [facility] [address] [place] [address] Phone: [phone] Fax: [phone]  [facility] [address] [place] [address] Phone: [phone] Fax: [phone]     Social Determinants of Health (SDOH) Social History: SDOH Screenings   Tobacco Use: High Risk (03/14/2023)   SDOH Interventions:     Readmission Risk Interventions    03/22/2023   10:26 AM  Readmission Risk Prevention Plan  Transportation Screening Complete  PCP or Specialist Appt within 3-5 Days Complete  HRI or Home Care Consult Complete  Social Work Consult for Recovery Care Planning/Counseling Complete  Palliative Care Screening Complete  Medication Review Oceanographer) Complete

## 2023-03-22 NOTE — Progress Notes (Signed)
EEG complete - results pending 

## 2023-03-22 NOTE — Progress Notes (Signed)
No machine for LTM, as per Derry Lory MD, pt can wait until morning

## 2023-03-22 NOTE — Evaluation (Signed)
Clinical/Bedside Swallow Evaluation Patient Details  Name: DENNICE FRIERSON MRN: [id number] Date of Birth: [birth year]  Today's Date: 03/22/2023 Time: SLP Start Time (ACUTE ONLY): 0347 SLP Stop Time (ACUTE ONLY): 0957 SLP Time Calculation (min) (ACUTE ONLY): 15 min  Past Medical History:  Past Medical History:  Diagnosis Date   AICD (automatic cardioverter/defibrillator) present    Allergy    Arthritis    FINGERS   Cancer (HCC)    left breast   Cataract    right   Crohn disease (HCC)    Depression    Diabetes mellitus without complication (HCC)    TYPE 2 , TAKING OTC MED FORGYMEMA   GERD (gastroesophageal reflux disease)    Headache    sinus   Heart failure, acute, systolic and diastolic (HCC) MARCH 2017 DX   History of colonic polyps    Hypertension    Hypothyroidism    per patient, takes OTC meds when temperature drops low   IBS (irritable bowel syndrome)    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   Seizures (HCC)    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, 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   Thyroid disease    AGE 82'S UNTIL  2016 OFF ALL THRYOID MEDS NOW    Wears dentures    Wears glasses    Past Surgical History:  Past Surgical History:  Procedure Laterality Date   ABDOMINAL HYSTERECTOMY     PARTIAL   BREAST BIOPSY Left 04/01/2019   INVASIVE DUCTAL CARCINOMA   BREAST SURGERY     CARDIAC CATHETERIZATION  02/10/2016   Procedure: Right/Left Heart Cath and Coronary Angiography;  Surgeon: Marykay Lex, MD;  [facility] INVASIVE CV LAB;  Service: Cardiovascular;;   CATARACT EXTRACTION W/ INTRAOCULAR LENS IMPLANT     left   COLONOSCOPY WITH PROPOFOL N/A 02/16/2017   Procedure: COLONOSCOPY WITH PROPOFOL;  Surgeon: Jeani Hawking, MD;  Location: [facility] ENDOSCOPY;  Service: Endoscopy;  Laterality: N/A;   COLONOSCOPY WITH PROPOFOL N/A 08/12/2021   Procedure: COLONOSCOPY WITH PROPOFOL;  Surgeon: Jeani Hawking, MD;  Location: [facility];  Service: Endoscopy;  Laterality: N/A;   EP IMPLANTABLE DEVICE N/A  02/10/2016   Procedure: ICD Implant;  Surgeon: Will Jorja Loa, MD;  Location: [facility] INVASIVE CV LAB;  Service: Cardiovascular;  Laterality: N/A;   ESOPHAGOGASTRODUODENOSCOPY (EGD) WITH PROPOFOL N/A 02/16/2017   Procedure: ESOPHAGOGASTRODUODENOSCOPY (EGD) WITH PROPOFOL;  Surgeon: Jeani Hawking, MD;  Location: [facility];  Service: Endoscopy;  Laterality: N/A;  reflux   IR IMAGING GUIDED PORT INSERTION  04/27/2020   IR IMAGING GUIDED PORT INSERTION  11/17/2022   MASTECTOMY Left    MASTECTOMY MODIFIED RADICAL Left 09/14/2020   Procedure: LEFT MASTECTOMY MODIFIED RADICAL;  Surgeon: Harriette Bouillon, MD;  Location: [facility] OR;  Service: General;  Laterality: Left;  PEC BLOCK   POLYPECTOMY  08/12/2021   Procedure: POLYPECTOMY;  Surgeon: Jeani Hawking, MD;  Location: [facility];  Service: Endoscopy;;   TONSILLECTOMY     HPI:  Pt is a 82 y.o. female who presented after being found outside the cancer center slumped over on a bench unresponsive, with initial shaking-like behavior. CT head negative. EEG WNL. PMH: seizure disorder, type 2 diabetes mellitus, essential hypertension.    Assessment / Plan / Recommendation  Clinical Impression  Pt was seen for bedside swallow evaluation and she denied a history of dysphagia. Oral mechanism exam was [facility] and she presented with seven anterior mandibular teeth only; pt stated that she owns dentures, but does not wear them. She  tolerated all solids and liquids without signs or symptoms of oropharyngeal dysphagia. A regular texture diet with thin liquids is recommended at this time and further skilled SLP services are not clinically indicated for swallowing. SLP Visit Diagnosis: Dysphagia, unspecified (R13.10)    Aspiration Risk  No limitations    Diet Recommendation Regular;Thin liquid   Liquid Administration via: Cup;Straw Medication Administration: Whole meds with liquid Supervision: Patient able to self feed Postural Changes: Seated upright at 90 degrees     Other  Recommendations Oral Care Recommendations: Oral care BID    Recommendations for follow up therapy are one component of a multi-disciplinary discharge planning process, led by the attending physician.  Recommendations may be updated based on patient status, additional functional criteria and insurance authorization.  Follow up Recommendations No SLP follow up      Assistance Recommended at Discharge    Functional Status Assessment    Frequency and Duration            Prognosis        Swallow Study   General Date of Onset: 03/22/23 HPI: Pt is a 82 y.o. female who presented after being found outside the cancer center slumped over on a bench unresponsive, with initial shaking-like behavior. CT head negative. EEG WNL. PMH: seizure disorder, type 2 diabetes mellitus, essential hypertension. Type of Study: Bedside Swallow Evaluation Previous Swallow Assessment: none Diet Prior to this Study: Regular;Thin liquids (Level 0) Temperature Spikes Noted: No Respiratory Status: Room air History of Recent Intubation: No Behavior/Cognition: Alert;Cooperative;Pleasant mood Oral Cavity Assessment: Within Functional Limits Oral Care Completed by SLP: No Oral Cavity - Dentition: Missing dentition;Poor condition Vision: Functional for self-feeding Self-Feeding Abilities: Able to feed self Patient Positioning: Upright in bed;Postural control adequate for testing Baseline Vocal Quality: Normal Volitional Cough: Strong Volitional Swallow: Able to elicit    Oral/Motor/Sensory Function Overall Oral Motor/Sensory Function: Within functional limits   Ice Chips Ice chips: Within functional limits Presentation: Spoon   Thin Liquid Thin Liquid: Within functional limits Presentation: Straw    Nectar Thick Nectar Thick Liquid: Not tested   Honey Thick Honey Thick Liquid: Not tested   Puree Puree: Within functional limits Presentation: Spoon   Solid     Solid: Within functional  limits Presentation: Self Fed     Luisenrique Conran I. Vear Clock, MS, CCC-SLP Neuro Diagnostic Specialist  Acute Rehabilitation Services Office number: [phone]  Scheryl Marten 03/22/2023,10:02 AM

## 2023-03-22 NOTE — Care Management CC44 (Cosign Needed)
Condition Code 44 Documentation Completed  Patient Details  Name: DELIYAH TEMKIN MRN: [id number] Date of Birth: [birth year]   Condition Code 44 given:  Yes Patient signature on Condition Code 44 notice:  Yes Documentation of 2 MD's agreement:  Yes Code 44 added to claim:  Yes    Janae Bridgeman, RN 03/22/2023, 10:11 AM

## 2023-03-22 NOTE — Discharge Summary (Signed)
Physician Discharge Summary  Colleen Lowery:[id number] DOB: 11-24-1940 DOA: 03/21/2023  PCP: Dortha Kern, MD  Admit date: 03/21/2023 Discharge date: 03/22/2023 30 Day Unplanned Readmission Risk Score    Flowsheet Row ED to Hosp-Admission (Current) from 03/21/2023 in [place] [facility]  30 Day Unplanned Readmission Risk Score (%) 24.51 Filed at 03/22/2023 0801       This score is the patient's risk of an unplanned readmission within 30 days of being discharged (0 -100%). The score is based on dignosis, age, lab data, medications, orders, and past utilization.   Low:  0-14.9   Medium: 15-21.9   High: 22-29.9   Extreme: 30 and above          Admitted From: Home Disposition: Home  Recommendations for Outpatient Follow-up:  Follow up with PCP in 1-2 weeks Please obtain BMP/CBC in one week Please follow up with your PCP on the following pending results: Unresulted Labs (From admission, onward)     Start     Ordered   03/28/23 0500  Creatinine, serum  (enoxaparin (LOVENOX)    CrCl >/= 30 ml/min)  Weekly,   R     Comments: while on enoxaparin therapy    03/21/23 2139   03/22/23 0500  Comprehensive metabolic panel  Tomorrow morning,   R        03/21/23 2138   03/22/23 0500  Magnesium  Tomorrow morning,   R        03/21/23 2138   03/21/23 2140  Levetiracetam level  Add-on,   AD        03/21/23 2139              Home Health: None Equipment/Devices: None  Discharge Condition: Stable CODE STATUS: DNR Diet recommendation: Cardiac  Subjective: Patient seen and examined.  She is fully alert and oriented and has no complaints.  She is agreeable with the plan of discharge.  Following HPI is copied from admitting hospitalist H&P. HPI: Colleen Lowery is a 82 y.o. female with medical history significant of breast cancer ER positive with liver metastasis, history of seizure disorder, type 2 diabetes mellitus, essential hypertension who presented to [facility] ED  after patient was found outside the cancer center slumped over on a bench unresponsive, with initial shaking-like behavior.  Apparently patient just completed a chemotherapy infusion.  She has a history of recurrent seizures, apparently related to postchemotherapy infusions.  No family present at bedside at time of admission, patient unable to participate due to her current mental status; and history obtained from chart review and discussion with the ED physician.   In the ED, temperature 99.7 F, HR 114, RR 13, BP 190/76, SpO2 96% on room air.  WBC 4.4, hemoglobin 12.9, platelets 220.  Sodium 138, potassium 3.5, chloride 106, CO2 15, glucose 281, BUN 12, creatinine 1.01.  Anion gap 17.  Magnesium 1.9.  CT head without contrast with no evidence of acute intracranial injury, old left thalamic lacunar infarct.  Patient was given 2 mg IV Ativan and loaded on 1 g IV Keppra.  Neurology was consulted who recommended MR brain with and without contrast; EEG and transferred to [facility] for for continued evaluation and care.  TRH consulted for admission.  Brief/Interim Summary: Patient was briefly admitted at [facility] per neurology recommendations.  She underwent EEG which was unremarkable.  Soon after admission, patient was fully alert and oriented and back at her baseline.  Her  Keppra was increased to 750 mg p.o. twice daily from 500 p.o. twice daily.  She was reassessed by neurology today and was cleared for discharge with follow-up with neurology as outpatient.  Based on that, she is being discharged home today.  Discharge plan was discussed with patient and/or family member and they verbalized understanding and agreed with it.  Discharge Diagnoses:  Principal Problem:   Seizure [facility]) Active Problems:   Essential hypertension    Discharge Instructions   Allergies as of 03/22/2023       Reactions   Alcohol Anaphylaxis, Swelling   Purell hand sanitizer Swelling in the luNGS, CHEST  AND THROAT and seizures Problem with smell not the Alcohol Other reaction(s): Other (See Comments) Causes seizures and swelling   Contrast Media [iodinated Contrast Media] Anaphylaxis   Throat closed, was 40 yrs ago *Iodine    Iodine Anaphylaxis   Throat closed, was 40 yrs ago   Atorvastatin    Myalgia    Dust Mite Extract Swelling   SWELLING IS GI SYSTEM   Latex    QUESTIONABLE INTOLERABLE MUCOUS OOZING OUT OF THROAT   Lisinopril Other (See Comments)   Pt does not remember reaction    Molds & Smuts Swelling   SWELLING IS IN GI SYSTEM   Wound Dressing Adhesive Other (See Comments)   Causes skin to tear        Medication List     TAKE these medications    alendronate 70 MG tablet Commonly known as: FOSAMAX Take 1 tablet (70 mg total) by mouth once a week. Take with a full glass of water on an empty stomach.   ARNICARE PAIN RELIEF EX Apply 1 application  topically daily as needed (pain).   B COMPLEX PLUS PO Take 4 tablets by mouth daily.   BLOOD SUGAR BALANCE PO Take 3 capsules by mouth daily. Blood Sugar Manager   CALCIUM PO Take 10 mLs by mouth daily. Liquid 300 MG /5 ML   diazepam 2 MG tablet Commonly known as: VALIUM SMARTSIG:1-2 Tablet(s) By Mouth As Directed   Flaxseed Oil 1000 MG Caps Take 3,000 mg by mouth daily. Taking as needed   furosemide 20 MG tablet Commonly known as: LASIX Take 20 mg by mouth daily.   levETIRAcetam 750 MG tablet Commonly known as: Keppra Take 1 tablet (750 mg total) by mouth 2 (two) times daily. What changed:  medication strength how much to take   lidocaine-prilocaine cream Commonly known as: EMLA Apply to affected area once   liothyronine 25 MCG tablet Commonly known as: CYTOMEL Take 25 mcg by mouth daily.   loperamide 2 MG capsule Commonly known as: IMODIUM Take 2 tabs by mouth with first loose stool, then 1 tab with each additional loose stool as needed. Do not exceed 8 tabs in a 24-hour period   losartan  50 MG tablet Commonly known as: COZAAR Take 50 mg by mouth daily.   MAGNESIUM PO Take 5 mLs by mouth daily. Liquid 2.5 ml/150 mg   ondansetron 8 MG tablet Commonly known as: Zofran Take 1 tablet (8 mg total) by mouth every 8 (eight) hours as needed for nausea or vomiting. Take 1 tablets (8mg  total) by mouth every 8hrs as needed. Start on the third day after chemotherapy.   OVER THE COUNTER MEDICATION Take 1 Scoop by mouth daily. Branched chain amino acids 4000  Patient states "taking for muscles"   OVER THE COUNTER MEDICATION Take 400-800 mg by mouth daily as needed (Based  on Blood glucose). gymnema sylvestre Patient takes for "lowering blood glucose"   OVER THE COUNTER MEDICATION Take 5 capsules by mouth daily. Pau d' arco 500 mg each  Patient states now taking liquid and "keeps mold down"   OVER THE COUNTER MEDICATION Take 6 capsules by mouth daily. Caprylic acid Patient taking to "lower candida risk"   OVER THE COUNTER MEDICATION Take 5 capsules by mouth daily. Pancreatic enzymes Patient taking "to get fluid out"   OVER THE COUNTER MEDICATION Take 3 capsules by mouth daily. Raw Adrenal Patient taking for "overall energy"   OVER THE COUNTER MEDICATION Take 6 capsules by mouth daily. Beef Pancreas Patient taking for "pancreatic function improvement and lymphatic system improvement"   PANTETHINE PO Take 1-2 tablets by mouth daily as needed (Itching). Vitamin B5   potassium chloride SA 20 MEQ tablet Commonly known as: KLOR-CON M Take 20 mEq by mouth 2 (two) times daily.   prochlorperazine 10 MG tablet Commonly known as: COMPAZINE Take 1 tablet (10 mg total) by mouth every 6 (six) hours as needed for nausea or vomiting.   Prolensa 0.07 % Soln Generic drug: Bromfenac Sodium Place 1 drop into the right eye 4 (four) times daily.   Systane Balance 0.6 % Soln Generic drug: Propylene Glycol Place 2 drops into the right eye in the morning and at bedtime.   True  Metrix Blood Glucose Test test strip Generic drug: glucose blood SMARTSIG:Via Meter   TRUEplus Lancets 33G Misc   vitamin A 11914 UNIT capsule Take 50,000 Units by mouth daily.   Vitamin B Complex Tabs Take 2 tablets by mouth daily.   VITAMIN C PO Take 2,000 mg by mouth daily.   Vitamin D3 125 MCG (5000 UT) Caps Take 5,000 Units by mouth daily. 2500 units per cap        Follow-up Information     Dortha Kern, MD Follow up in 1 week(s).   Specialty: Family Medicine Contact information: [address] [place] [address] [phone]                Allergies  Allergen Reactions   Alcohol Anaphylaxis and Swelling    Purell hand sanitizer  Swelling in the luNGS, CHEST AND THROAT and seizures Problem with smell not the Alcohol Other reaction(s): Other (See Comments) Causes seizures and swelling   Contrast Media [Iodinated Contrast Media] Anaphylaxis    Throat closed, was 40 yrs ago  *Iodine    Iodine Anaphylaxis    Throat closed, was 40 yrs ago   Atorvastatin     Myalgia    Dust Mite Extract Swelling    SWELLING IS GI SYSTEM   Latex     QUESTIONABLE INTOLERABLE MUCOUS OOZING OUT OF THROAT   Lisinopril Other (See Comments)    Pt does not remember reaction    Molds & Smuts Swelling    SWELLING IS IN GI SYSTEM   Wound Dressing Adhesive Other (See Comments)    Causes skin to tear    Consultations: Neurology   Procedures/Studies: EEG adult now  Result Date: 03/22/2023 Charlsie Quest, MD     03/22/2023  8:35 AM Patient Name: CEAIRA TAMBURRO MRN: [id number] Epilepsy Attending: Charlsie Quest Referring Physician/Provider: [place], Eric J, DO Date: 03/22/2023 Duration: 25.49 mins Patient history: 82 y.o. female with a pertinent medical history of AICD, left breast cancer, liver mets, Crohns, diabetes, heart failure, seizure , and thyroid disease who was brought to the [facility] ED after she was  found seizing in the cancer center parking lot. EEG to evaluate for  seizure Level of alertness: Awake AEDs during EEG study: LEV Technical aspects: This EEG study was done with scalp electrodes positioned according to the 10-20 International system of electrode placement. Electrical activity was reviewed with band pass filter of 1-70Hz , sensitivity of 7 uV/mm, display speed of 49mm/sec with a 60Hz  notched filter applied as appropriate. EEG data were recorded continuously and digitally stored.  Video monitoring was available and reviewed as appropriate. Description: The posterior dominant rhythm consists of 9 Hz activity of moderate voltage (25-35 uV) seen predominantly in posterior head regions, symmetric and reactive to eye opening and eye closing. Hyperventilation and photic stimulation were not performed.   IMPRESSION: This study is within normal limits. No seizures or epileptiform discharges were seen throughout the recording. A normal interictal EEG does not exclude the diagnosis of epilepsy. Charlsie Quest   DG CHEST PORT 1 VIEW  Result Date: 03/21/2023 CLINICAL DATA:  Shortness of breath EXAM: PORTABLE CHEST 1 VIEW COMPARISON:  12/11/2022 FINDINGS: Right-sided central venous port tip over the cavoatrial region. Left-sided single lead pacing device as before. Borderline cardiomegaly with aortic atherosclerosis. No acute airspace disease, pleural effusion, or pneumothorax. IMPRESSION: No active disease. Borderline cardiomegaly. Electronically Signed   By: Jasmine Pang M.D.   On: 03/21/2023 21:59   CT Head Wo Contrast  Result Date: 03/21/2023 CLINICAL DATA:  Head trauma, minor. EXAM: CT HEAD WITHOUT CONTRAST TECHNIQUE: Contiguous axial images were obtained from the base of the skull through the vertex without intravenous contrast. RADIATION DOSE REDUCTION: This exam was performed according to the departmental dose-optimization program which includes automated exposure control, adjustment of the mA and/or kV according to patient size and/or use of iterative  reconstruction technique. COMPARISON:  Head CT 12/11/2022. FINDINGS: Brain: No acute intracranial hemorrhage. Old lacunar infarct in the left thalamus is unchanged. Gray-white differentiation is otherwise preserved. No hydrocephalus or extra-axial collection. No mass effect or midline shift. Vascular: No hyperdense vessel or unexpected calcification. Skull: No calvarial fracture or suspicious bone lesion. Skull base is unremarkable. Sinuses/Orbits: Unremarkable. Other: None. IMPRESSION: 1. No evidence of acute intracranial injury. 2. Old left thalamic lacunar infarct. Electronically Signed   By: Orvan Falconer M.D.   On: 03/21/2023 16:56     Discharge Exam: Vitals:   03/22/23 0600 03/22/23 0742  BP: (!) 123/59 (!) 161/84  Pulse: 77 83  Resp: 16 19  Temp:  98.6 F (37 C)  SpO2:  100%   Vitals:   03/22/23 0120 03/22/23 0405 03/22/23 0600 03/22/23 0742  BP: (!) 183/86 (!) 176/93 (!) 123/59 (!) 161/84  Pulse: 82 83 77 83  Resp:  16 16 19   Temp:  99 F (37.2 C)  98.6 F (37 C)  TempSrc:  Oral    SpO2:  100%  100%  Weight:      Height:        General: Pt is alert, awake, not in acute distress Cardiovascular: RRR, S1/S2 +, no rubs, no gallops Respiratory: CTA bilaterally, no wheezing, no rhonchi Abdominal: Soft, NT, ND, bowel sounds + Extremities: no edema, no cyanosis    The results of significant diagnostics from this hospitalization (including imaging, microbiology, ancillary and laboratory) are listed below for reference.     Microbiology: No results found for this or any previous visit (from the past 240 hour(s)).   Labs: BNP (last 3 results) No results for input(s): "BNP" in the last 8760 hours. Basic Metabolic  Panel: Recent Labs  Lab 03/21/23 0954 03/21/23 1615 03/21/23 1621 03/21/23 2224  NA 142 138 140  --   K 3.5 3.5 3.6  --   CL 108 106 106  --   CO2 25 15*  --   --   GLUCOSE 148* 281* 272*  --   BUN 9 12 11   --   CREATININE 0.77 1.01* 0.70 0.76  CALCIUM  8.6* 8.8*  --   --   MG 2.0 1.9  --   --   PHOS 3.3  --   --   --    Liver Function Tests: Recent Labs  Lab 03/21/23 0954  AST 16  ALT 9  ALKPHOS 93  BILITOT 0.3  PROT 6.9  ALBUMIN 4.0   No results for input(s): "LIPASE", "AMYLASE" in the last 168 hours. No results for input(s): "AMMONIA" in the last 168 hours. CBC: Recent Labs  Lab 03/21/23 0954 03/21/23 1615 03/21/23 1621 03/21/23 2224 03/22/23 0727  WBC 4.6 4.4  --  3.0* 5.1  NEUTROABS 2.3 3.2  --   --   --   HGB 13.2 12.9 13.9 13.4 12.9  HCT 39.9 41.7 41.0 40.8 37.3  MCV 90.1 94.3  --  88.7 86.3  PLT 225 220  --  203 199   Cardiac Enzymes: No results for input(s): "CKTOTAL", "CKMB", "CKMBINDEX", "TROPONINI" in the last 168 hours. BNP: Invalid input(s): "POCBNP" CBG: Recent Labs  Lab 03/21/23 1600 03/21/23 2154 03/22/23 0211 03/22/23 0553 03/22/23 0743  GLUCAP 254* 193* 172* 133* 151*   D-Dimer No results for input(s): "DDIMER" in the last 72 hours. Hgb A1c Recent Labs    03/21/23 2224  HGBA1C 7.3*   Lipid Profile No results for input(s): "CHOL", "HDL", "LDLCALC", "TRIG", "CHOLHDL", "LDLDIRECT" in the last 72 hours. Thyroid function studies Recent Labs    03/21/23 2224  TSH 0.193*   Anemia work up No results for input(s): "VITAMINB12", "FOLATE", "FERRITIN", "TIBC", "IRON", "RETICCTPCT" in the last 72 hours. Urinalysis    Component Value Date/Time   COLORURINE COLORLESS (A) 12/19/2022 2041   APPEARANCEUR CLEAR 12/19/2022 2041   APPEARANCEUR Clear 06/20/2012 1207   LABSPEC 1.005 12/19/2022 2041   LABSPEC 1.012 06/20/2012 1207   PHURINE 6.0 12/19/2022 2041   GLUCOSEU 150 (A) 12/19/2022 2041   GLUCOSEU >=500 06/20/2012 1207   HGBUR SMALL (A) 12/19/2022 2041   BILIRUBINUR NEGATIVE 12/19/2022 2041   BILIRUBINUR Negative 06/20/2012 1207   KETONESUR NEGATIVE 12/19/2022 2041   PROTEINUR 30 (A) 12/19/2022 2041   NITRITE NEGATIVE 12/19/2022 2041   LEUKOCYTESUR NEGATIVE 12/19/2022 2041    LEUKOCYTESUR Negative 06/20/2012 1207   Sepsis Labs Recent Labs  Lab 03/21/23 0954 03/21/23 1615 03/21/23 2224 03/22/23 0727  WBC 4.6 4.4 3.0* 5.1   Microbiology No results found for this or any previous visit (from the past 240 hour(s)).   Time coordinating discharge: Over 30 minutes  SIGNED:   Hughie Closs, MD  [facility] 03/22/2023, 9:22 AM *Please note that this is a verbal dictation therefore any spelling or grammatical errors are due to the "Dragon Medical One" system interpretation. If 7PM-7AM, please contact night-coverage [email or web address]

## 2023-03-22 NOTE — Care Management Obs Status (Cosign Needed)
MEDICARE OBSERVATION STATUS NOTIFICATION   Patient Details  Name: Colleen Lowery MRN: [id number] Date of Birth: 02/17/1941   Medicare Observation Status Notification Given:  Yes    Janae Bridgeman, RN 03/22/2023, 10:11 AM

## 2023-03-22 NOTE — Procedures (Signed)
Patient Name: Colleen Lowery  MRN: [id number]  Epilepsy Attending: Charlsie Quest  Referring Physician/Provider: [place], Eric J, DO  Date: 03/22/2023 Duration: 25.49 mins  Patient history: 82 y.o. female with a pertinent medical history of AICD, left breast cancer, liver mets, Crohns, diabetes, heart failure, seizure , and thyroid disease who was brought to the [facility] ED after she was found seizing in the cancer center parking lot. EEG to evaluate for seizure  Level of alertness: Awake  AEDs during EEG study: LEV  Technical aspects: This EEG study was done with scalp electrodes positioned according to the 10-20 International system of electrode placement. Electrical activity was reviewed with band pass filter of 1-70Hz , sensitivity of 7 uV/mm, display speed of 1mm/sec with a 60Hz  notched filter applied as appropriate. EEG data were recorded continuously and digitally stored.  Video monitoring was available and reviewed as appropriate.  Description: The posterior dominant rhythm consists of 9 Hz activity of moderate voltage (25-35 uV) seen predominantly in posterior head regions, symmetric and reactive to eye opening and eye closing. Hyperventilation and photic stimulation were not performed.     IMPRESSION: This study is within normal limits. No seizures or epileptiform discharges were seen throughout the recording.  A normal interictal EEG does not exclude the diagnosis of epilepsy.  [name] Annabelle Harman

## 2023-03-22 NOTE — Plan of Care (Signed)
Patient AOX2, disoriented to place and situation.  Baseline confusion, short term memory and slurred speech.  Bed alarm on.  Diminished lungs, IS taught and encouraged.  All meds given on time as ordered.  Pt standby assist to [facility].  Full linen changed.  New PIV placed.  Fluids on-going.  POC maintained, will continue to monitor.  Problem: Education: Goal: Ability to describe self-care measures that may prevent or decrease complications (Diabetes Survival Skills Education) will improve Outcome: Progressing Goal: Individualized Educational Video(s) Outcome: Progressing   Problem: Coping: Goal: Ability to adjust to condition or change in health will improve Outcome: Progressing   Problem: Fluid Volume: Goal: Ability to maintain a balanced intake and output will improve Outcome: Progressing   Problem: Health Behavior/Discharge Planning: Goal: Ability to identify and utilize available resources and services will improve Outcome: Progressing Goal: Ability to manage health-related needs will improve Outcome: Progressing   Problem: Metabolic: Goal: Ability to maintain appropriate glucose levels will improve Outcome: Progressing   Problem: Nutritional: Goal: Maintenance of adequate nutrition will improve Outcome: Progressing Goal: Progress toward achieving an optimal weight will improve Outcome: Progressing   Problem: Skin Integrity: Goal: Risk for impaired skin integrity will decrease Outcome: Progressing   Problem: Tissue Perfusion: Goal: Adequacy of tissue perfusion will improve Outcome: Progressing   Problem: Education: Goal: Knowledge of General Education information will improve Description: Including pain rating scale, medication(s)/side effects and non-pharmacologic comfort measures Outcome: Progressing   Problem: Health Behavior/Discharge Planning: Goal: Ability to manage health-related needs will improve Outcome: Progressing   Problem: Clinical Measurements: Goal:  Ability to maintain clinical measurements within normal limits will improve Outcome: Progressing Goal: Will remain free from infection Outcome: Progressing Goal: Diagnostic test results will improve Outcome: Progressing Goal: Respiratory complications will improve Outcome: Progressing Goal: Cardiovascular complication will be avoided Outcome: Progressing   Problem: Activity: Goal: Risk for activity intolerance will decrease Outcome: Progressing   Problem: Nutrition: Goal: Adequate nutrition will be maintained Outcome: Progressing   Problem: Coping: Goal: Level of anxiety will decrease Outcome: Progressing   Problem: Elimination: Goal: Will not experience complications related to bowel motility Outcome: Progressing Goal: Will not experience complications related to urinary retention Outcome: Progressing   Problem: Pain Managment: Goal: General experience of comfort will improve Outcome: Progressing   Problem: Safety: Goal: Ability to remain free from injury will improve Outcome: Progressing   Problem: Skin Integrity: Goal: Risk for impaired skin integrity will decrease Outcome: Progressing

## 2023-03-23 ENCOUNTER — Emergency Department (HOSPITAL_COMMUNITY): Payer: Medicare HMO

## 2023-03-23 ENCOUNTER — Encounter (HOSPITAL_COMMUNITY): Payer: Self-pay | Admitting: Emergency Medicine

## 2023-03-23 ENCOUNTER — Other Ambulatory Visit: Payer: Self-pay

## 2023-03-23 ENCOUNTER — Observation Stay (HOSPITAL_COMMUNITY)
Admission: EM | Admit: 2023-03-23 | Discharge: 2023-03-25 | Disposition: A | Discharge status: Home / Self Care | Payer: Medicare HMO | Attending: Internal Medicine | Admitting: Internal Medicine

## 2023-03-23 DIAGNOSIS — C50912 Malignant neoplasm of unspecified site of left female breast: Secondary | ICD-10-CM | POA: Diagnosis not present

## 2023-03-23 DIAGNOSIS — C787 Secondary malignant neoplasm of liver and intrahepatic bile duct: Secondary | ICD-10-CM | POA: Insufficient documentation

## 2023-03-23 DIAGNOSIS — F17210 Nicotine dependence, cigarettes, uncomplicated: Secondary | ICD-10-CM | POA: Insufficient documentation

## 2023-03-23 DIAGNOSIS — Z79899 Other long term (current) drug therapy: Secondary | ICD-10-CM | POA: Insufficient documentation

## 2023-03-23 DIAGNOSIS — R7989 Other specified abnormal findings of blood chemistry: Secondary | ICD-10-CM | POA: Diagnosis not present

## 2023-03-23 DIAGNOSIS — I2510 Atherosclerotic heart disease of native coronary artery without angina pectoris: Secondary | ICD-10-CM | POA: Insufficient documentation

## 2023-03-23 DIAGNOSIS — Z4502 Encounter for adjustment and management of automatic implantable cardiac defibrillator: Secondary | ICD-10-CM

## 2023-03-23 DIAGNOSIS — R109 Unspecified abdominal pain: Secondary | ICD-10-CM

## 2023-03-23 DIAGNOSIS — I5020 Unspecified systolic (congestive) heart failure: Secondary | ICD-10-CM

## 2023-03-23 DIAGNOSIS — I472 Ventricular tachycardia, unspecified: Secondary | ICD-10-CM | POA: Diagnosis not present

## 2023-03-23 DIAGNOSIS — E876 Hypokalemia: Secondary | ICD-10-CM | POA: Diagnosis not present

## 2023-03-23 DIAGNOSIS — I5043 Acute on chronic combined systolic (congestive) and diastolic (congestive) heart failure: Secondary | ICD-10-CM | POA: Diagnosis not present

## 2023-03-23 DIAGNOSIS — Y712 Prosthetic and other implants, materials and accessory cardiovascular devices associated with adverse incidents: Secondary | ICD-10-CM | POA: Diagnosis not present

## 2023-03-23 DIAGNOSIS — T82897A Other specified complication of cardiac prosthetic devices, implants and grafts, initial encounter: Secondary | ICD-10-CM | POA: Diagnosis not present

## 2023-03-23 DIAGNOSIS — Z91040 Latex allergy status: Secondary | ICD-10-CM | POA: Diagnosis not present

## 2023-03-23 DIAGNOSIS — K5900 Constipation, unspecified: Secondary | ICD-10-CM | POA: Diagnosis not present

## 2023-03-23 DIAGNOSIS — Z95810 Presence of automatic (implantable) cardiac defibrillator: Secondary | ICD-10-CM | POA: Insufficient documentation

## 2023-03-23 DIAGNOSIS — Z8673 Personal history of transient ischemic attack (TIA), and cerebral infarction without residual deficits: Secondary | ICD-10-CM | POA: Diagnosis not present

## 2023-03-23 DIAGNOSIS — E039 Hypothyroidism, unspecified: Secondary | ICD-10-CM | POA: Diagnosis not present

## 2023-03-23 DIAGNOSIS — E119 Type 2 diabetes mellitus without complications: Secondary | ICD-10-CM | POA: Diagnosis not present

## 2023-03-23 DIAGNOSIS — I4729 Other ventricular tachycardia: Secondary | ICD-10-CM | POA: Diagnosis present

## 2023-03-23 DIAGNOSIS — R1084 Generalized abdominal pain: Principal | ICD-10-CM | POA: Insufficient documentation

## 2023-03-23 DIAGNOSIS — I1A0 Resistant hypertension: Secondary | ICD-10-CM | POA: Diagnosis not present

## 2023-03-23 LAB — CBC WITH DIFFERENTIAL/PLATELET
Abs Immature Granulocytes: 0.02 10*3/uL (ref 0.00–0.07)
Basophils Absolute: 0 10*3/uL (ref 0.0–0.1)
Basophils Relative: 0 %
Eosinophils Absolute: 0 10*3/uL (ref 0.0–0.5)
Eosinophils Relative: 0 %
HCT: 39.1 % (ref 36.0–46.0)
Hemoglobin: 12.8 g/dL (ref 12.0–15.0)
Immature Granulocytes: 0 %
Lymphocytes Relative: 23 %
Lymphs Abs: 1.1 10*3/uL (ref 0.7–4.0)
MCH: 29.1 pg (ref 26.0–34.0)
MCHC: 32.7 g/dL (ref 30.0–36.0)
MCV: 88.9 fL (ref 80.0–100.0)
Monocytes Absolute: 0.4 10*3/uL (ref 0.1–1.0)
Monocytes Relative: 8 %
Neutro Abs: 3.2 10*3/uL (ref 1.7–7.7)
Neutrophils Relative %: 69 %
Platelets: 193 10*3/uL (ref 150–400)
RBC: 4.4 MIL/uL (ref 3.87–5.11)
RDW: 15 % (ref 11.5–15.5)
WBC: 4.7 10*3/uL (ref 4.0–10.5)
nRBC: 0 % (ref 0.0–0.2)

## 2023-03-23 LAB — COMPREHENSIVE METABOLIC PANEL
ALT: 16 U/L (ref 0–44)
AST: 31 U/L (ref 15–41)
Albumin: 3.5 g/dL (ref 3.5–5.0)
Alkaline Phosphatase: 87 U/L (ref 38–126)
Anion gap: 10 (ref 5–15)
BUN: 9 mg/dL (ref 8–23)
CO2: 22 mmol/L (ref 22–32)
Calcium: 9.2 mg/dL (ref 8.9–10.3)
Chloride: 105 mmol/L (ref 98–111)
Creatinine, Ser: 0.79 mg/dL (ref 0.44–1.00)
GFR, Estimated: >60 mL/min (ref >60)
Glucose, Bld: 142 mg/dL — ABNORMAL HIGH (ref 70–99)
Potassium: 3.2 mmol/L — ABNORMAL LOW (ref 3.5–5.1)
Sodium: 137 mmol/L (ref 135–145)
Total Bilirubin: 0.6 mg/dL (ref 0.3–1.2)
Total Protein: 6.5 g/dL (ref 6.5–8.1)

## 2023-03-23 LAB — CBC
HCT: 39.4 % (ref 36.0–46.0)
Hemoglobin: 12.8 g/dL (ref 12.0–15.0)
MCH: 29.2 pg (ref 26.0–34.0)
MCHC: 32.5 g/dL (ref 30.0–36.0)
MCV: 90 fL (ref 80.0–100.0)
Platelets: 197 10*3/uL (ref 150–400)
RBC: 4.38 MIL/uL (ref 3.87–5.11)
RDW: 14.9 % (ref 11.5–15.5)
WBC: 4.3 10*3/uL (ref 4.0–10.5)
nRBC: 0 % (ref 0.0–0.2)

## 2023-03-23 LAB — CREATININE, SERUM
Creatinine, Ser: 0.62 mg/dL (ref 0.44–1.00)
GFR, Estimated: >60 mL/min (ref >60)

## 2023-03-23 LAB — TROPONIN I (HIGH SENSITIVITY)
Troponin I (High Sensitivity): 383 ng/L (ref <18)
Troponin I (High Sensitivity): 227 ng/L (ref <18)

## 2023-03-23 LAB — LEVETIRACETAM LEVEL: Levetiracetam Lvl: 16.2 ug/mL (ref 10.0–40.0)

## 2023-03-23 LAB — LIPASE, BLOOD: Lipase: 34 U/L (ref 11–51)

## 2023-03-23 MED ORDER — FUROSEMIDE 20 MG PO TABS
20.0000 mg | ORAL_TABLET | Freq: Every day | ORAL | Status: DC
  Administered 2023-03-23 – 2023-03-25 (×3): 20 mg via ORAL
  Filled 2023-03-23 (×3): qty 1

## 2023-03-23 MED ORDER — POLYETHYLENE GLYCOL 3350 17 GM/SCOOP PO POWD: 17.0000 g | Freq: Two times a day (BID) | ORAL | 0 refills | Status: DC

## 2023-03-23 MED ORDER — OXYCODONE HCL 5 MG PO TABS: 5.0000 mg | ORAL_TABLET | Freq: Four times a day (QID) | ORAL | Status: DC | PRN

## 2023-03-23 MED ORDER — HYDRALAZINE HCL 20 MG/ML IJ SOLN: 5.0000 mg | Freq: Four times a day (QID) | INTRAMUSCULAR | Status: DC | PRN

## 2023-03-23 MED ORDER — POLYETHYLENE GLYCOL 3350 17 G PO PACK: 17.0000 g | PACK | Freq: Every day | ORAL | Status: DC | PRN

## 2023-03-23 MED ORDER — LIDOCAINE-PRILOCAINE 2.5-2.5 % EX CREA: 1.0000 | TOPICAL_CREAM | Freq: Every day | CUTANEOUS | Status: DC | PRN

## 2023-03-23 MED ORDER — ENOXAPARIN SODIUM 40 MG/0.4ML IJ SOSY
40.0000 mg | PREFILLED_SYRINGE | INTRAMUSCULAR | Status: DC
  Administered 2023-03-23 – 2023-03-24 (×2): 40 mg via SUBCUTANEOUS
  Filled 2023-03-23 (×2): qty 0.4

## 2023-03-23 MED ORDER — DICYCLOMINE HCL 20 MG PO TABS: 20.0000 mg | ORAL_TABLET | Freq: Four times a day (QID) | ORAL | 0 refills | Status: DC | PRN

## 2023-03-23 MED ORDER — LIOTHYRONINE SODIUM 25 MCG PO TABS
25.0000 ug | ORAL_TABLET | Freq: Every day | ORAL | Status: DC
  Administered 2023-03-23 – 2023-03-25 (×3): 25 ug via ORAL
  Filled 2023-03-23 (×3): qty 1

## 2023-03-23 MED ORDER — SODIUM CHLORIDE 0.9% FLUSH
10.0000 mL | Freq: Two times a day (BID) | INTRAVENOUS | Status: DC
  Administered 2023-03-24 (×3): 10 mL
  Administered 2023-03-25: 20 mL

## 2023-03-23 MED ORDER — POTASSIUM CHLORIDE CRYS ER 20 MEQ PO TBCR
20.0000 meq | EXTENDED_RELEASE_TABLET | Freq: Two times a day (BID) | ORAL | Status: DC
  Administered 2023-03-23 – 2023-03-25 (×4): 20 meq via ORAL
  Filled 2023-03-23 (×4): qty 1

## 2023-03-23 MED ORDER — CHLORHEXIDINE GLUCONATE CLOTH 2 % EX PADS: 6.0000 | MEDICATED_PAD | Freq: Every day | CUTANEOUS | Status: DC

## 2023-03-23 MED ORDER — HYDROMORPHONE HCL 1 MG/ML IJ SOLN: 0.5000 mg | INTRAMUSCULAR | Status: DC | PRN

## 2023-03-23 MED ORDER — MELATONIN 5 MG PO TABS
5.0000 mg | ORAL_TABLET | Freq: Every evening | ORAL | Status: DC | PRN
  Administered 2023-03-24: 5 mg via ORAL
  Filled 2023-03-23: qty 1

## 2023-03-23 MED ORDER — POTASSIUM CHLORIDE CRYS ER 20 MEQ PO TBCR
40.0000 meq | EXTENDED_RELEASE_TABLET | Freq: Once | ORAL | Status: AC
  Administered 2023-03-23: 40 meq via ORAL
  Filled 2023-03-23: qty 2

## 2023-03-23 MED ORDER — SODIUM CHLORIDE 0.9% FLUSH
10.0000 mL | INTRAVENOUS | Status: DC | PRN
  Administered 2023-03-25: 10 mL

## 2023-03-23 MED ORDER — LOSARTAN POTASSIUM 50 MG PO TABS
50.0000 mg | ORAL_TABLET | Freq: Every day | ORAL | Status: DC
  Administered 2023-03-23 – 2023-03-25 (×3): 50 mg via ORAL
  Filled 2023-03-23 (×3): qty 1

## 2023-03-23 MED ORDER — ACETAMINOPHEN 325 MG PO TABS
650.0000 mg | ORAL_TABLET | Freq: Four times a day (QID) | ORAL | Status: DC | PRN
  Administered 2023-03-24: 650 mg via ORAL
  Filled 2023-03-23: qty 2

## 2023-03-23 MED ORDER — SENNOSIDES-DOCUSATE SODIUM 8.6-50 MG PO TABS
2.0000 | ORAL_TABLET | Freq: Two times a day (BID) | ORAL | Status: DC
  Administered 2023-03-24 – 2023-03-25 (×3): 2 via ORAL
  Filled 2023-03-23 (×3): qty 2

## 2023-03-23 MED ORDER — PROCHLORPERAZINE EDISYLATE 10 MG/2ML IJ SOLN: 5.0000 mg | Freq: Four times a day (QID) | INTRAMUSCULAR | Status: DC | PRN

## 2023-03-23 MED ORDER — METOPROLOL TARTRATE 25 MG PO TABS
25.0000 mg | ORAL_TABLET | Freq: Two times a day (BID) | ORAL | Status: DC
  Administered 2023-03-23 – 2023-03-25 (×4): 25 mg via ORAL
  Filled 2023-03-23 (×4): qty 1

## 2023-03-23 MED ORDER — LEVETIRACETAM 750 MG PO TABS
750.0000 mg | ORAL_TABLET | Freq: Two times a day (BID) | ORAL | Status: DC
  Administered 2023-03-23 – 2023-03-25 (×4): 750 mg via ORAL
  Filled 2023-03-23 (×5): qty 1

## 2023-03-23 NOTE — ED Triage Notes (Signed)
Pt to ER via EMS from home with c/o RUQ abdominal pain starting around 3AM.  Pt also endorses constipation for several days.  Pt reported to EMS that her ICD may have fired several times around the same time, but described it as a cramping pain.  Pt states she took her Losartan this AM, but did not take her lasix.

## 2023-03-23 NOTE — H&P (Addendum)
History and Physical  Colleen Lowery ZOX:[id number] DOB: 02-Mar-1941 DOA: 03/23/2023  Referring physician: Arthor Captain, PA-EDP  PCP: Dortha Kern, MD  Outpatient Specialists: Cardiology Patient coming from: Home  Chief Complaint:  AICD firing several times today, abdominal pain and constipation.  HPI: Colleen Lowery is a 82 y.o. female with medical history significant for coronary artery disease with occluded RCA, HFrEF status post ICD placement, history of V. 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  In the ED, cardiology was consulted and her ICD device was interrogated revealing an episode of treated ventricular tachycardia with 340 J shocks at 2:40 AM.  The patient was previously on sotalol but is not currently on any nodal agents or antiarrhythmics.  She had breakthrough monomorphic VT on sotalol.  The patient follows with EP outpatient.  For her abdominal pain she had a CT scan of abdomen without contrast which showed no acute findings in the abdomen or pelvis.  Prominent stool volume.  Could be compatible with constipation in the appropriate clinical setting.  Liver metastasis.  Lab studies were notable for hypokalemia 3.2, replacement was initiated in the ED.  TRH, hospitalist service, was asked to admit.  Admitted to [facility] telemetry cardiac unit as observation status.  ED Course: Temperature 98.6.  BP 170/81, pulse 70, respiration rate 19, O2 saturation 99% on room air.  Notable lab studies as stated above.  CBC was essentially unremarkable.  Review of Systems: Review of systems as noted in the HPI. All other systems reviewed and are  negative.   Past Medical History:  Diagnosis Date   AICD (automatic cardioverter/defibrillator) present    Allergy    Arthritis    FINGERS   Cancer (HCC)    left breast   Cataract    right   Crohn disease (HCC)    Depression    Diabetes mellitus without complication (HCC)    TYPE 2 , TAKING OTC MED FORGYMEMA   GERD (gastroesophageal reflux disease)    Headache    sinus   Heart failure, acute, systolic and diastolic (HCC) MARCH 2017 DX   History of colonic polyps    Hypertension    Hypothyroidism    per patient, takes OTC meds when temperature drops low   IBS (irritable bowel syndrome)    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   Seizures (HCC)    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, 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   Thyroid disease    AGE 74'S UNTIL  2016 OFF ALL THRYOID MEDS NOW    Wears dentures    Wears glasses    Past Surgical History:  Procedure Laterality Date   ABDOMINAL HYSTERECTOMY     PARTIAL   BREAST BIOPSY Left 04/01/2019   INVASIVE DUCTAL CARCINOMA   BREAST SURGERY     CARDIAC CATHETERIZATION  02/10/2016   Procedure: Right/Left Heart Cath and Coronary Angiography;  Surgeon: Marykay Lex, MD;  Location: [facility] INVASIVE CV LAB;  Service: Cardiovascular;;   CATARACT EXTRACTION W/ INTRAOCULAR LENS IMPLANT     left   COLONOSCOPY WITH PROPOFOL N/A 02/16/2017   Procedure: COLONOSCOPY WITH PROPOFOL;  Surgeon: Jeani Hawking, MD;  Location: [facility] ENDOSCOPY;  Service: Endoscopy;  Laterality:  N/A;   COLONOSCOPY WITH PROPOFOL N/A 08/12/2021   Procedure: COLONOSCOPY WITH PROPOFOL;  Surgeon: Jeani Hawking, MD;  Location: [facility] ENDOSCOPY;  Service: Endoscopy;  Laterality: N/A;   EP IMPLANTABLE DEVICE N/A 02/10/2016   Procedure: ICD Implant;  Surgeon: Will Jorja Loa, MD;  Location: [facility] INVASIVE CV LAB;  Service: Cardiovascular;  Laterality: N/A;   ESOPHAGOGASTRODUODENOSCOPY (EGD) WITH PROPOFOL N/A 02/16/2017   Procedure: ESOPHAGOGASTRODUODENOSCOPY (EGD) WITH PROPOFOL;  Surgeon: Jeani Hawking, MD;   Location: [facility] ENDOSCOPY;  Service: Endoscopy;  Laterality: N/A;  reflux   IR IMAGING GUIDED PORT INSERTION  04/27/2020   IR IMAGING GUIDED PORT INSERTION  11/17/2022   MASTECTOMY Left    MASTECTOMY MODIFIED RADICAL Left 09/14/2020   Procedure: LEFT MASTECTOMY MODIFIED RADICAL;  Surgeon: Harriette Bouillon, MD;  Location: [facility] OR;  Service: General;  Laterality: Left;  PEC BLOCK   POLYPECTOMY  08/12/2021   Procedure: POLYPECTOMY;  Surgeon: Jeani Hawking, MD;  Location: [facility] ENDOSCOPY;  Service: Endoscopy;;   TONSILLECTOMY      Social History:  reports that she has been smoking cigarettes. She has a 12.50 pack-year smoking history. She has never used smokeless tobacco. She reports that she does not drink alcohol and does not use drugs.   Allergies  Allergen Reactions   Alcohol Anaphylaxis and Swelling    Purell hand sanitizer  Swelling in the luNGS, CHEST AND THROAT and seizures Problem with smell not the Alcohol Other reaction(s): Other (See Comments) Causes seizures and swelling   Contrast Media [Iodinated Contrast Media] Anaphylaxis    Throat closed, was 40 yrs ago  *Iodine    Iodine Anaphylaxis    Throat closed, was 40 yrs ago   Atorvastatin     Myalgia    Dust Mite Extract Swelling    SWELLING IS GI SYSTEM   Latex     QUESTIONABLE INTOLERABLE MUCOUS OOZING OUT OF THROAT   Lisinopril Other (See Comments)    Pt does not remember reaction    Molds & Smuts Swelling    SWELLING IS IN GI SYSTEM   Wound Dressing Adhesive Other (See Comments)    Causes skin to tear    Family History  Problem Relation Age of Onset   Diabetes Mother    Stroke Mother    Diabetes Brother    Diabetes Maternal Grandmother    Diabetes Paternal Grandfather    Heart failure Paternal Grandfather    Hypertension Brother    Heart attack Neg Hx       Prior to Admission medications   Medication Sig Start Date End Date Taking? Authorizing Provider  alendronate (FOSAMAX) 70 MG tablet Take 1 tablet (70 mg  total) by mouth once a week. Take with a full glass of water on an empty stomach. 05/24/22  Yes Serena Croissant, MD  Ascorbic Acid (VITAMIN C PO) Take 2,000 mg by mouth daily.   Yes [provider]  B Complex Vitamins (VITAMIN B COMPLEX) TABS Take 2 tablets by mouth daily.   Yes [provider]  Beta Carotene (VITAMIN A) 25000 UNIT capsule Take 50,000 Units by mouth daily.   Yes [provider]  Cholecalciferol (VITAMIN D3) 5000 units CAPS Take 5,000 Units by mouth daily. 2500 units per cap   Yes [provider]  dicyclomine (BENTYL) 20 MG tablet Take 1 tablet (20 mg total) by mouth every 6 (six) hours as needed for spasms (stomach cramps). 03/23/23  Yes Harris, Abigail, PA-C  Flaxseed, Linseed, (FLAXSEED OIL) 1000 MG CAPS Take  3,000 mg by mouth daily. Taking as needed   Yes [provider]  furosemide (LASIX) 20 MG tablet Take 20 mg by mouth daily.   Yes [provider]  levETIRAcetam (KEPPRA) 750 MG tablet Take 1 tablet (750 mg total) by mouth 2 (two) times daily. 03/22/23 04/21/23 Yes Pahwani, Daleen Bo, MD  lidocaine-prilocaine (EMLA) cream Apply to affected area once Patient taking differently: Apply 1 Application topically daily as needed (For pain). 01/08/23  Yes Serena Croissant, MD  liothyronine (CYTOMEL) 25 MCG tablet Take 25 mcg by mouth daily. 01/08/23  Yes [provider]  losartan (COZAAR) 50 MG tablet Take 50 mg by mouth daily. 12/16/19  Yes [provider]  MAGNESIUM PO Take 5 mLs by mouth daily. Liquid 2.5 ml/150 mg   Yes [provider]  ondansetron (ZOFRAN) 8 MG tablet Take 1 tablet (8 mg total) by mouth every 8 (eight) hours as needed for nausea or vomiting. Take 1 tablets (8mg  total) by mouth every 8hrs as needed. Start on the third day after chemotherapy. 10/31/22  Yes Serena Croissant, MD  OVER THE COUNTER MEDICATION Take 1 Scoop by mouth daily. Medication: Branched chain amino acids 4000  Patient states "taking for  muscles"   Yes [provider]  OVER THE COUNTER MEDICATION Take 400-800 mg by mouth daily as needed (Based on Blood glucose). Medication: gymnema sylvestre Patient takes for "lowering blood glucose"   Yes [provider]  OVER THE COUNTER MEDICATION Take 5 capsules by mouth daily. Medication: Pau d' arco 500 mg each  Patient states now taking liquid and "keeps mold down"   Yes [provider]  OVER THE COUNTER MEDICATION Take 6 capsules by mouth daily. Medication: Caprylic acid Patient taking to "lower candida risk"   Yes [provider]  OVER THE COUNTER MEDICATION Take 5 capsules by mouth daily. Medication: Pancreatic enzymes Patient taking "to get fluid out"   Yes [provider]  OVER THE COUNTER MEDICATION Take 3 capsules by mouth daily. Raw Adrenal Patient taking for "overall energy"   Yes [provider]  OVER THE COUNTER MEDICATION Take 6 capsules by mouth daily. Beef Pancreas Patient taking for "pancreatic function improvement and lymphatic system improvement"   Yes [provider]  PANTETHINE PO Take 1-2 tablets by mouth daily as needed (Itching). Medication: Vitamin B5   Yes [provider]  polyethylene glycol powder (GLYCOLAX/MIRALAX) 17 GM/SCOOP powder Take 17 g by mouth 2 (two) times daily. 03/23/23  Yes Harris, Abigail, PA-C  potassium chloride SA (K-DUR,KLOR-CON) 20 MEQ tablet Take 20 mEq by mouth 2 (two) times daily.   Yes [provider]  prochlorperazine (COMPAZINE) 10 MG tablet Take 1 tablet (10 mg total) by mouth every 6 (six) hours as needed for nausea or vomiting. 10/31/22  Yes Serena Croissant, MD  PROLENSA 0.07 % SOLN Place 1 drop into the right eye 4 (four) times daily. 08/15/22  Yes [provider]  Propylene Glycol (SYSTANE BALANCE) 0.6 % SOLN Place 2 drops into the right eye in the morning and at bedtime.   Yes [provider]  CALCIUM PO Take 10 mLs by mouth daily.  Liquid 300 MG /5 ML Patient not taking: Reported on 03/23/2023    [provider]  Homeopathic Products (ARNICARE PAIN RELIEF EX) Apply 1 application  topically daily as needed (pain).    [provider]  loperamide (IMODIUM) 2 MG capsule Take 2 tabs by mouth with first loose stool, then 1 tab  with each additional loose stool as needed. Do not exceed 8 tabs in a 24-hour period Patient not taking: Reported on 03/23/2023 10/31/22   Serena Croissant, MD  Misc Natural Products (BLOOD SUGAR BALANCE PO) Take 3 capsules by mouth daily. Blood Sugar Manager    [provider]  TRUE METRIX BLOOD GLUCOSE TEST test strip SMARTSIG:Via Meter 09/28/22   [provider]  TRUEplus Lancets 33G MISC  09/28/22   [provider]    Physical Exam: BP (!) 170/81   Pulse 70   Temp 98.7 F (37.1 C) (Oral)   Resp 19   Ht 5\' 2"  (1.575 m)   Wt 61.2 kg   SpO2 99%   BMI 24.69 kg/m   General: 82 y.o. year-old female well developed well nourished in no acute distress.  Alert and oriented x3. Cardiovascular: Regular rate and rhythm with no rubs or gallops.  No thyromegaly or JVD noted.  No lower extremity edema. 2/4 pulses in all 4 extremities. Respiratory: Clear to auscultation with no wheezes or rales. Good inspiratory effort. Abdomen: Soft nontender stating the pain has improved since she arrived to the ED.  Nondistended with normal bowel sounds x4 quadrants. Muskuloskeletal: No cyanosis, clubbing or edema noted bilaterally Neuro: CN II-XII intact, strength, sensation, reflexes Skin: No ulcerative lesions noted or rashes Psychiatry: Judgement and insight appear normal. Mood is appropriate for condition and setting          Labs on Admission:  Basic Metabolic Panel: Recent Labs  Lab 03/21/23 0954 03/21/23 1615 03/21/23 1621 03/21/23 2224 03/22/23 0727 03/23/23 1241  NA 142 138 140  --  139 137  K 3.5 3.5 3.6  --  3.4* 3.2*  CL 108 106 106  --  108 105  CO2 25  15*  --   --  21* 22  GLUCOSE 148* 281* 272*  --  148* 142*  BUN 9 12 11   --  8 9  CREATININE 0.77 1.01* 0.70 0.76 0.70 0.79  CALCIUM 8.6* 8.8*  --   --  8.6* 9.2  MG 2.0 1.9  --   --  2.0  --   PHOS 3.3  --   --   --   --   --    Liver Function Tests: Recent Labs  Lab 03/21/23 0954 03/22/23 0727 03/23/23 1241  AST 16 21 31   ALT 9 14 16   ALKPHOS 93 80 87  BILITOT 0.3 0.5 0.6  PROT 6.9 6.2* 6.5  ALBUMIN 4.0 3.3* 3.5   Recent Labs  Lab 03/23/23 1241  LIPASE 34   No results for input(s): "AMMONIA" in the last 168 hours. CBC: Recent Labs  Lab 03/21/23 0954 03/21/23 1615 03/21/23 1621 03/21/23 2224 03/22/23 0727 03/23/23 1241  WBC 4.6 4.4  --  3.0* 5.1 4.7  NEUTROABS 2.3 3.2  --   --   --  3.2  HGB 13.2 12.9 13.9 13.4 12.9 12.8  HCT 39.9 41.7 41.0 40.8 37.3 39.1  MCV 90.1 94.3  --  88.7 86.3 88.9  PLT 225 220  --  203 199 193   Cardiac Enzymes: No results for input(s): "CKTOTAL", "CKMB", "CKMBINDEX", "TROPONINI" in the last 168 hours.  BNP (last 3 results) No results for input(s): "BNP" in the last 8760 hours.  ProBNP (last 3 results) No results for input(s): "PROBNP" in the last 8760 hours.  CBG: Recent Labs  Lab 03/21/23 2154 03/22/23 0211 03/22/23 0553 03/22/23 0743 03/22/23 1041  GLUCAP 193* 172* 133*  151* 230*    Radiological Exams on Admission: CT ABDOMEN PELVIS WO CONTRAST  Result Date: 03/23/2023 CLINICAL DATA:  Acute abdominal pain.  Metastatic breast cancer. EXAM: CT ABDOMEN AND PELVIS WITHOUT CONTRAST TECHNIQUE: Multidetector CT imaging of the abdomen and pelvis was performed following the standard protocol without IV contrast. RADIATION DOSE REDUCTION: This exam was performed according to the departmental dose-optimization program which includes automated exposure control, adjustment of the mA and/or kV according to patient size and/or use of iterative reconstruction technique. COMPARISON:  None Available.  01/19/2023 FINDINGS: Lower chest:  Unremarkable. Hepatobiliary: 11 x 10 mm hypodensity in the dome of the liver was measured previously at 3.2 x 2.6 cm and characterized as metastases. There is no evidence for gallstones, gallbladder wall thickening, or pericholecystic fluid. No intrahepatic or extrahepatic biliary dilation. Pancreas: No focal mass lesion. No dilatation of the main duct. No intraparenchymal cyst. No peripancreatic edema. Spleen: No splenomegaly. No focal mass lesion. Adrenals/Urinary Tract: Stable bilateral adrenal thickening without discrete nodule or mass. Right kidney unremarkable. Small cyst upper pole left kidney is stable since prior. No followup imaging is recommended. No evidence for hydroureter. The urinary bladder appears normal for the degree of distention. Stomach/Bowel: Tiny hiatal hernia. Stomach otherwise unremarkable. Duodenum is normally positioned as is the ligament of Treitz. No small bowel wall thickening. No small bowel dilatation. The terminal ileum is normal. The appendix is not well visualized, but there is no edema or inflammation in the region of the cecum. No gross colonic mass. No colonic wall thickening. Prominent stool volume noted right, transverse, and proximal descending colon. Vascular/Lymphatic: There is moderate atherosclerotic calcification of the abdominal aorta. Infrarenal abdominal aorta measures 3.1 cm diameter. There is no gastrohepatic or hepatoduodenal ligament lymphadenopathy. The portal caval lymph node of concern previously is within normal limits for size today. No retroperitoneal or mesenteric lymphadenopathy. No pelvic sidewall lymphadenopathy. Reproductive: Hysterectomy.  There is no adnexal mass. Other: No intraperitoneal free fluid. Musculoskeletal: No worrisome lytic or sclerotic osseous abnormality. IMPRESSION: 1. No acute findings in the abdomen or pelvis. Specifically, no findings to explain the patient's history of abdominal pain. 2. 11 x 10 mm hypodensity in the dome of the  liver was measured previously at 3.2 x 2.6 cm and characterized as metastases. 3. Index portacaval node measured previously is unenlarged by CT size criteria today. 4. Prominent stool volume. Imaging features could be compatible with constipation in the appropriate clinical setting. 5. Tiny hiatal hernia. 6. Infrarenal abdominal aortic aneurysm. Recommend follow-up ultrasound every 3 years. This recommendation follows ACR consensus guidelines: White Paper of the ACR Incidental Findings Committee II on Vascular Findings. J Am Coll Radiol 2013; 10:789-794. 7.  Aortic Atherosclerosis (ICD10-I70.0). Electronically Signed   By: Kennith Center M.D.   On: 03/23/2023 14:48   DG Chest Port 1 View  Result Date: 03/23/2023 CLINICAL DATA:  Abdominal distention. History of metastatic breast cancer. EXAM: PORTABLE CHEST 1 VIEW COMPARISON:  X-ray chest 03/21/2023.  CT scan 01/19/2023 FINDINGS: Enlarged cardiopericardial silhouette with a calcified aorta left upper chest defibrillator with leads overlying the right side of the heart. Right-sided IJ chest port with tip along the SVC right atrial junction region. The port is accessed. No consolidation, pneumothorax or effusion. No edema. Old rib fractures. Overlapping cardiac leads. Surgical clips in the left axillary region. IMPRESSION: Enlarged heart.  Chest port.  Defibrillator. Electronically Signed   By: Karen Kays M.D.   On: 03/23/2023 12:51   EEG adult now  Result  Date: 03/22/2023 Charlsie Quest, MD     03/22/2023  8:35 AM Patient Name: MAYOLA OTTOSON MRN: [id number] Epilepsy Attending: Charlsie Quest Referring Physician/Provider: [place], Eric J, DO Date: 03/22/2023 Duration: 25.49 mins Patient history: 82 y.o. female with a pertinent medical history of AICD, left breast cancer, liver mets, Crohns, diabetes, heart failure, seizure , and thyroid disease who was brought to the [facility] ED after she was found seizing in the cancer center parking lot. EEG to evaluate for seizure  Level of alertness: Awake AEDs during EEG study: LEV Technical aspects: This EEG study was done with scalp electrodes positioned according to the 10-20 International system of electrode placement. Electrical activity was reviewed with band pass filter of 1-70Hz , sensitivity of 7 uV/mm, display speed of 55mm/sec with a 60Hz  notched filter applied as appropriate. EEG data were recorded continuously and digitally stored.  Video monitoring was available and reviewed as appropriate. Description: The posterior dominant rhythm consists of 9 Hz activity of moderate voltage (25-35 uV) seen predominantly in posterior head regions, symmetric and reactive to eye opening and eye closing. Hyperventilation and photic stimulation were not performed.   IMPRESSION: This study is within normal limits. No seizures or epileptiform discharges were seen throughout the recording. A normal interictal EEG does not exclude the diagnosis of epilepsy. Charlsie Quest   DG CHEST PORT 1 VIEW  Result Date: 03/21/2023 CLINICAL DATA:  Shortness of breath EXAM: PORTABLE CHEST 1 VIEW COMPARISON:  12/11/2022 FINDINGS: Right-sided central venous port tip over the cavoatrial region. Left-sided single lead pacing device as before. Borderline cardiomegaly with aortic atherosclerosis. No acute airspace disease, pleural effusion, or pneumothorax. IMPRESSION: No active disease. Borderline cardiomegaly. Electronically Signed   By: Jasmine Pang M.D.   On: 03/21/2023 21:59    EKG: I independently viewed the EKG done and my findings are as followed: Sinus rhythm rate of 67.  Nonspecific ST-T changes.  QTc 429.  Assessment/Plan Present on Admission:  Ventricular tachycardia (HCC)  Active Problems:   AICD discharge   Resistant hypertension   HFrEF (heart failure with reduced ejection fraction) (HCC)   Ventricular tachycardia (HCC)  Ventricular tachycardia, status post AICD firing/discharge Follows with electrophysiology outpatient Seen by  cardiology in the ED, will follow cardiologist and electrophysiologist recommendations Follow 2D echo Replace electrolytes as indicated Closely monitor on telemetry.    Elevated troponin, suspect demand ischemia in the setting of AICD discharge Troponin peaked at 383 and down trended. Follow 2D echo Closely monitor on telemetry. Cardiology following.  Chronic combined diastolic and systolic CHF with LVEF 40 to 45% Last 2D echo done in 2017 revealed LVEF 40 to 45% mild hypokinesis of the inferior and inferoseptal myocardium.  Grade 2 diastolic dysfunction. Euvolemic on exam Resume home cardiac regimen, goal-directed medical therapy Start strict I's and O's and daily weight Rest of management per cardiology.  Essential hypertension, BP is not at goal, elevated Resume home cardiac medications, Lopressor 25 mg twice daily, p.o. Lasix 20 mg daily, losartan 50 mg daily IV hydralazine as needed with parameters Closely monitor vital signs.  Hypokalemia Repleted Goal potassium greater than 4.0.  Recent breakthrough seizures, recently admitted Resume home AEDs Seizure precautions Counseled on the importance of seizure precautions.  The patient does not drive.  Hypothyroidism Obtain TSH Resume home regimen.  Current tobacco user Recommend complete tobacco use cessation.  Stage IV breast cancer with metastasis to the liver Recommend outpatient follow-up with oncology.  Chronic constipation Last bowel movement was 4 days  ago Start bowel regimen    DVT prophylaxis: Subcu Lovenox daily  Code Status: DNR  Family Communication: None at bedside.  Disposition Plan: Admitted to telemetry cardiac unit  Consults called: Cardiology consulted.  Admission status: Observation status.   Status is: Observation    Darlin Drop MD [facility] Pager [phone]  If 7PM-7AM, please contact night-coverage [email or web address] Password [facility]  03/23/2023, 7:34 PM

## 2023-03-23 NOTE — ED Provider Notes (Signed)
[place] [facility] AT [facility] Provider Note   CSN: [id number] Arrival date & time: 03/23/23  1208     History  Chief Complaint  Patient presents with   Abdominal Pain    Colleen Lowery is a 82 y.o. 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   The history is provided by the patient and medical records.  Abdominal Pain Pain location:  Generalized Pain quality: aching   Pain radiates to:  Does not radiate Pain severity:  Moderate Onset quality:  Gradual Duration:  8 hours Timing:  Constant Progression:  Worsening Chronicity:  New Context: not alcohol use, not awakening from sleep, not diet changes, not eating, not laxative use, not previous surgeries, not retching, not sick contacts and not suspicious food intake   Worsened by:  Movement Ineffective treatments:  Antacids (aloe, herbal supplements) Associated symptoms: constipation   Associated symptoms: no diarrhea, no nausea and no shortness of breath   Risk factors: being elderly   Risk factors comment:  Metastatic breast cancer with liver mets, active chemo      Home Medications Prior to Admission medications   Medication Sig Start Date End Date Taking? Authorizing Provider  alendronate (FOSAMAX) 70 MG tablet Take 1 tablet (70 mg total) by mouth once a week. Take with a full glass of water on an empty stomach. 05/24/22   Serena Croissant, MD  Ascorbic Acid (VITAMIN C PO) Take 2,000 mg by mouth daily.    [provider]  B Complex Vitamins (VITAMIN B COMPLEX) TABS Take 2 tablets by mouth daily.    [provider]  B Complex-Folic Acid (B COMPLEX PLUS PO) Take 4 tablets by mouth daily.    [provider]  Beta Carotene (VITAMIN A) 25000 UNIT capsule Take 50,000 Units by mouth daily.    [provider]  CALCIUM PO Take 10 mLs by mouth daily. Liquid 300 MG /5 ML    [provider]  Cholecalciferol (VITAMIN D3) 5000 units CAPS Take 5,000 Units by mouth daily. 2500 units per cap    [provider]  diazepam (VALIUM) 2 MG tablet SMARTSIG:1-2 Tablet(s) By Mouth As Directed 01/10/23   [provider]  Flaxseed, Linseed, (FLAXSEED OIL) 1000 MG CAPS Take 3,000 mg by mouth daily. Taking as needed    [provider]  furosemide (LASIX) 20 MG tablet Take 20 mg by mouth daily.    [provider]  Homeopathic Products (ARNICARE PAIN RELIEF EX) Apply 1 application  topically daily as needed (pain).    [provider]  levETIRAcetam (KEPPRA) 750 MG tablet Take 1 tablet (750 mg total) by mouth 2 (two) times daily. 03/22/23 04/21/23  Hughie Closs, MD  lidocaine-prilocaine (EMLA) cream Apply to affected area once 01/08/23   Serena Croissant, MD  liothyronine (CYTOMEL) 25 MCG tablet Take 25 mcg by mouth daily. 01/08/23   [provider]  loperamide (IMODIUM) 2 MG capsule Take 2 tabs by mouth with first loose stool, then 1 tab with each additional loose stool as needed. Do not exceed 8 tabs in a 24-hour period 10/31/22   Serena Croissant, MD  losartan (COZAAR) 50 MG tablet Take 50 mg by mouth daily. 12/16/19   [provider]  MAGNESIUM PO Take 5 mLs by mouth daily. Liquid 2.5 ml/150 mg    [provider]  Misc Natural Products (BLOOD SUGAR BALANCE PO) Take 3 capsules by mouth daily. Blood Sugar Manager    [provider]  ondansetron (ZOFRAN) 8 MG tablet Take 1 tablet (8 mg total) by mouth every 8 (eight) hours as  needed for nausea or vomiting. Take 1 tablets (8mg  total) by mouth every 8hrs as needed. Start on the third day after chemotherapy. 10/31/22   Serena Croissant, MD  OVER THE COUNTER MEDICATION Take 1 Scoop by mouth daily. Branched chain amino acids 4000  Patient states "taking for muscles"    [provider]  OVER THE COUNTER MEDICATION Take 400-800 mg by mouth daily as needed (Based on Blood glucose). gymnema sylvestre Patient takes for "lowering blood glucose"    [provider]  OVER THE COUNTER MEDICATION Take 5 capsules by mouth daily. Pau d' arco 500 mg each  Patient states now taking liquid and "keeps mold down"    [provider]  OVER THE COUNTER MEDICATION Take 6 capsules by mouth daily. Caprylic acid Patient taking to "lower candida risk"    [provider]  OVER THE COUNTER MEDICATION Take 5 capsules by mouth daily. Pancreatic enzymes Patient taking "to get fluid out"    [provider]  OVER THE COUNTER MEDICATION Take 3 capsules by mouth daily. Raw Adrenal Patient taking for "overall energy"    [provider]  OVER THE COUNTER MEDICATION Take 6 capsules by mouth daily. Beef Pancreas Patient taking for "pancreatic function improvement and lymphatic system improvement"    [provider]  PANTETHINE PO Take 1-2 tablets by mouth daily as needed (Itching). Vitamin B5    [provider]  potassium chloride SA (K-DUR,KLOR-CON) 20 MEQ tablet Take 20 mEq by mouth 2 (two) times daily.    [provider]  prochlorperazine (COMPAZINE) 10 MG tablet Take 1 tablet (10 mg total) by mouth every 6 (six) hours as needed for nausea or vomiting. 10/31/22   Serena Croissant, MD  PROLENSA 0.07 % SOLN Place 1 drop into the right eye 4 (four) times daily. 08/15/22   [provider]  Propylene Glycol (SYSTANE BALANCE) 0.6 % SOLN Place 2 drops into the right eye in the morning and at bedtime.    [provider]   TRUE METRIX BLOOD GLUCOSE TEST test strip SMARTSIG:Via Meter 09/28/22   [provider]  TRUEplus Lancets 33G MISC  09/28/22   [provider]      Allergies    Alcohol, Contrast media [iodinated contrast media], Iodine, Atorvastatin, Dust mite extract, Latex, Lisinopril, Molds & smuts, and Wound dressing adhesive    Review of Systems   Review of Systems  Respiratory:  Negative for shortness of breath.   Gastrointestinal:  Positive for abdominal pain and constipation. Negative for diarrhea and nausea.    Physical Exam Updated Vital Signs There were no vitals taken for this visit. Physical Exam Vitals and nursing note reviewed.  Constitutional:      General: She is not in acute distress.    Appearance: She is well-developed. She is not diaphoretic.  HENT:     Head: Normocephalic and atraumatic.     Right Ear: External ear normal.     Left Ear: External ear normal.     Nose: Nose normal.  Mouth/Throat:     Mouth: Mucous membranes are moist.  Eyes:     General: No scleral icterus.    Conjunctiva/sclera: Conjunctivae normal.  Cardiovascular:     Rate and Rhythm: Normal rate and regular rhythm.     Heart sounds: Normal heart sounds. No murmur heard.    No friction rub. No gallop.  Pulmonary:     Effort: Pulmonary effort is normal. No respiratory distress.     Breath sounds: Normal breath sounds.  Chest:     Comments: S/p Left mastectomy AICD present in L chest wall, Port-a cath in R chest wall Abdominal:     General: Bowel sounds are normal.     Palpations: Abdomen is soft. There is no mass.     Tenderness: There is no abdominal tenderness. There is no guarding.  Musculoskeletal:     Cervical back: Normal range of motion.  Skin:    General: Skin is warm and dry.  Neurological:     Mental Status: She is alert and oriented to person, place, and time.  Psychiatric:        Behavior: Behavior normal.    ED Results / Procedures / Treatments    Labs (all labs ordered are listed, but only abnormal results are displayed) Labs Reviewed - No data to display  EKG None  Radiology EEG adult now  Result Date: 03/22/2023 Charlsie Quest, MD     03/22/2023  8:35 AM Patient Name: Colleen Lowery MRN: [id number] Epilepsy Attending: Charlsie Quest Referring Physician/Provider: [place], Eric J, DO Date: 03/22/2023 Duration: 25.49 mins Patient history: 82 y.o. female with a pertinent medical history of AICD, left breast cancer, liver mets, Crohns, diabetes, heart failure, seizure , and thyroid disease who was brought to the [facility] ED after she was found seizing in the cancer center parking lot. EEG to evaluate for seizure Level of alertness: Awake AEDs during EEG study: LEV Technical aspects: This EEG study was done with scalp electrodes positioned according to the 10-20 International system of electrode placement. Electrical activity was reviewed with band pass filter of 1-70Hz , sensitivity of 7 uV/mm, display speed of 65mm/sec with a 60Hz  notched filter applied as appropriate. EEG data were recorded continuously and digitally stored.  Video monitoring was available and reviewed as appropriate. Description: The posterior dominant rhythm consists of 9 Hz activity of moderate voltage (25-35 uV) seen predominantly in posterior head regions, symmetric and reactive to eye opening and eye closing. Hyperventilation and photic stimulation were not performed.   IMPRESSION: This study is within normal limits. No seizures or epileptiform discharges were seen throughout the recording. A normal interictal EEG does not exclude the diagnosis of epilepsy. Charlsie Quest   DG CHEST PORT 1 VIEW  Result Date: 03/21/2023 CLINICAL DATA:  Shortness of breath EXAM: PORTABLE CHEST 1 VIEW COMPARISON:  12/11/2022 FINDINGS: Right-sided central venous port tip over the cavoatrial region. Left-sided single lead pacing device as before. Borderline cardiomegaly with aortic atherosclerosis. No  acute airspace disease, pleural effusion, or pneumothorax. IMPRESSION: No active disease. Borderline cardiomegaly. Electronically Signed   By: Jasmine Pang M.D.   On: 03/21/2023 21:59   CT Head Wo Contrast  Result Date: 03/21/2023 CLINICAL DATA:  Head trauma, minor. EXAM: CT HEAD WITHOUT CONTRAST TECHNIQUE: Contiguous axial images were obtained from the base of the skull through the vertex without intravenous contrast. RADIATION DOSE REDUCTION: This exam was performed according to the departmental dose-optimization program which includes automated exposure control, adjustment of the mA  and/or kV according to patient size and/or use of iterative reconstruction technique. COMPARISON:  Head CT 12/11/2022. FINDINGS: Brain: No acute intracranial hemorrhage. Old lacunar infarct in the left thalamus is unchanged. Gray-white differentiation is otherwise preserved. No hydrocephalus or extra-axial collection. No mass effect or midline shift. Vascular: No hyperdense vessel or unexpected calcification. Skull: No calvarial fracture or suspicious bone lesion. Skull base is unremarkable. Sinuses/Orbits: Unremarkable. Other: None. IMPRESSION: 1. No evidence of acute intracranial injury. 2. Old left thalamic lacunar infarct. Electronically Signed   By: Orvan Falconer M.D.   On: 03/21/2023 16:56    Procedures Procedures    Medications Ordered in ED Medications - No data to display  ED Course/ Medical Decision Making/ A&P Clinical Course as of 03/23/23 1713  Fri Mar 23, 2023  1226 Patient here with constipation x 2 days, abd distension and discomfort.  Hx of metatstatic cancer with liver mets, crohns.  The differential diagnosis for generalized abdominal pain includes, but is not limited to AAA, gastroenteritis, appendicitis, Bowel obstruction, Bowel perforation. Gastroparesis, DKA, Hernia, Inflammatory bowel disease, mesenteric ischemia, pancreatitis, peritonitis SBP, volvulus.  Labs , imaging ordered. No signs  of periotnitis, rigidity, or ascities [AH]  1438 Potassium(!): 3.2 [AH]  1438 Glucose(!): 142 [AH]  1439 DG Chest Port 1 View [AH]  1552 Patient was being discharged however then told me that she thinks her defibrillator went off 3 times at 2:00 in the morning.  She did not mention this when I asked her about her discomfort earlier.  Patient describes hearing "pop pop pop."  She denies feeling anything.  We will do a pacemaker interrogation and if negative she may be discharged.  I have some concern that there may be secondary gain as the patient seems to continue to come up with new complaints and I am wondering if she is looking to be admitted however we will make sure she has no emergent causes of her symptoms. [AH]  1613 Discussed with [employer] rep about device interrogation. Device was removed from right to left side in October of 2023. Device appears to be functioning as programmed. Home monitor transmitted to [employer] before coming to the hospital did have a treated VT episode and three 40J shocks. Shocks were delivered at 0240 this morning. Also with 2 SVT events on may 8th. >5 NSVT events with most recent on April 22nd of 2024.  [RP]  1704 Device appears to be functioning as programmed. Home monitor transmitted to [employer] before coming to the hospital did have a treated VT episode and three 40J shocks. Shocks were delivered at 0240 this morning. Also with 2 SVT events on may 8th. >5 NSVT events with most recent on April 22nd of 2024.  [AH]  1704 Cardiology to admit  [AH]    Clinical Course User Index [AH] Arthor Captain, PA-C [RP] Rondel Baton, MD                             Medical Decision Making 82 year old female who presents emergency department initially with complaint of abdominal discomfort and swelling.The differential diagnosis for generalized abdominal pain includes, but is not limited to AAA, gastroenteritis, appendicitis, Bowel obstruction, Bowel perforation.  Gastroparesis, DKA, Hernia, Inflammatory bowel disease, mesenteric ischemia, pancreatitis, peritonitis SBP, volvulus.  I ordered labs including CBC which showed no acute findings, CMP without abnormality.  I ordered and interpreted imaging including chest x-ray, CT abdomen pelvis.  No acute findings on chest x-ray.  No acute findings on CT abdomen pelvis.  Large stool burden likely the cause of her distention and discomfort.  Patient without complaint during the exam.  At discharge after going over all findings patient then informed me she had her ICD fired 3 times.  She did not relay this information to me.  Patient states "I told 3 other people."  However she did not divulges this information during initial intake.  I had the patient's pacemaker interrogated and findings are listed in the ED course.  I discussed the case with cardiology who states the patient will be admitted need to be admitted to the hospitalist service.  I ordered troponins which are currently pending.  Patient will be admitted for further observation.  Amount and/or Complexity of Data Reviewed Labs: ordered. Decision-making details documented in ED Course. Radiology: ordered. Decision-making details documented in ED Course.  Risk Prescription drug management. Decision regarding hospitalization.           Final Clinical Impression(s) / ED Diagnoses Final diagnoses:  Constipation, unspecified constipation type  Abdominal discomfort  AICD discharge    Rx / DC Orders ED Discharge Orders     None         Arthor Captain, PA-C 03/23/23 2201    Rondel Baton, MD 03/24/23 [id number]

## 2023-03-23 NOTE — ED Notes (Signed)
ED TO INPATIENT HANDOFF REPORT  ED Nurse Name and Phone #: Alycia Rossetti [phone]   S Name/Age/Gender Colleen Lowery 82 y.o. female Room/Bed: 011C/011C  Code Status   Code Status: DNR  Home/SNF/Other Home Patient oriented to: self, place, time, and situation Is this baseline? Yes   Triage Complete: Triage complete  Chief Complaint Ventricular tachycardia (HCC) [I47.20]  Triage Note Pt to ER via EMS from home with c/o RUQ abdominal pain starting around 3AM.  Pt also endorses constipation for several days.  Pt reported to EMS that her ICD may have fired several times around the same time, but described it as a cramping pain.  Pt states she took her Losartan this AM, but did not take her lasix.   Allergies Allergies  Allergen Reactions   Alcohol Anaphylaxis and Swelling    Purell hand sanitizer  Swelling in the luNGS, CHEST AND THROAT and seizures Problem with smell not the Alcohol Other reaction(s): Other (See Comments) Causes seizures and swelling   Contrast Media [Iodinated Contrast Media] Anaphylaxis    Throat closed, was 40 yrs ago  *Iodine    Iodine Anaphylaxis    Throat closed, was 40 yrs ago   Atorvastatin     Myalgia    Dust Mite Extract Swelling    SWELLING IS GI SYSTEM   Latex     QUESTIONABLE INTOLERABLE MUCOUS OOZING OUT OF THROAT   Lisinopril Other (See Comments)    Pt does not remember reaction    Molds & Smuts Swelling    SWELLING IS IN GI SYSTEM   Wound Dressing Adhesive Other (See Comments)    Causes skin to tear    Level of Care/Admitting Diagnosis ED Disposition     ED Disposition  Admit   Condition  --   Comment  Hospital Area: [facility] [[id number]]  Level of Care: Telemetry Cardiac [[id number]]  May place patient in observation at [facility] or [facility] if equivalent level of care is available:: No  Covid Evaluation: Asymptomatic - no recent exposure (last 10 days) testing not required  Diagnosis: Ventricular tachycardia [facility])  [[id number]]  Admitting Physician: Darlin Drop [[id number]]  Attending Physician: Darlin Drop [[id number]]          B Medical/Surgery History Past Medical History:  Diagnosis Date   AICD (automatic cardioverter/defibrillator) present    Allergy    Arthritis    FINGERS   Cancer (HCC)    left breast   Cataract    right   Crohn disease (HCC)    Depression    Diabetes mellitus without complication (HCC)    TYPE 2 , TAKING OTC MED FORGYMEMA   GERD (gastroesophageal reflux disease)    Headache    sinus   Heart failure, acute, systolic and diastolic (HCC) MARCH 2017 DX   History of colonic polyps    Hypertension    Hypothyroidism    per patient, takes OTC meds when temperature drops low   IBS (irritable bowel syndrome)    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   Seizures (HCC)    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, 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   Thyroid disease    AGE 78'S UNTIL  2016 OFF ALL THRYOID MEDS NOW    Wears dentures    Wears glasses    Past Surgical History:  Procedure Laterality Date   ABDOMINAL HYSTERECTOMY     PARTIAL   BREAST BIOPSY Left 04/01/2019   INVASIVE DUCTAL CARCINOMA   BREAST  SURGERY     CARDIAC CATHETERIZATION  02/10/2016   Procedure: Right/Left Heart Cath and Coronary Angiography;  Surgeon: Marykay Lex, MD;  Location: [facility] INVASIVE CV LAB;  Service: Cardiovascular;;   CATARACT EXTRACTION W/ INTRAOCULAR LENS IMPLANT     left   COLONOSCOPY WITH PROPOFOL N/A 02/16/2017   Procedure: COLONOSCOPY WITH PROPOFOL;  Surgeon: Jeani Hawking, MD;  Location: [facility] ENDOSCOPY;  Service: Endoscopy;  Laterality: N/A;   COLONOSCOPY WITH PROPOFOL N/A 08/12/2021   Procedure: COLONOSCOPY WITH PROPOFOL;  Surgeon: Jeani Hawking, MD;  Location: [facility] ENDOSCOPY;  Service: Endoscopy;  Laterality: N/A;   EP IMPLANTABLE DEVICE N/A 02/10/2016   Procedure: ICD Implant;  Surgeon: Will Jorja Loa, MD;  Location: [facility] INVASIVE CV LAB;  Service: Cardiovascular;  Laterality: N/A;    ESOPHAGOGASTRODUODENOSCOPY (EGD) WITH PROPOFOL N/A 02/16/2017   Procedure: ESOPHAGOGASTRODUODENOSCOPY (EGD) WITH PROPOFOL;  Surgeon: Jeani Hawking, MD;  Location: [facility] ENDOSCOPY;  Service: Endoscopy;  Laterality: N/A;  reflux   IR IMAGING GUIDED PORT INSERTION  04/27/2020   IR IMAGING GUIDED PORT INSERTION  11/17/2022   MASTECTOMY Left    MASTECTOMY MODIFIED RADICAL Left 09/14/2020   Procedure: LEFT MASTECTOMY MODIFIED RADICAL;  Surgeon: Harriette Bouillon, MD;  Location: [facility] OR;  Service: General;  Laterality: Left;  PEC BLOCK   POLYPECTOMY  08/12/2021   Procedure: POLYPECTOMY;  Surgeon: Jeani Hawking, MD;  Location: [facility] ENDOSCOPY;  Service: Endoscopy;;   TONSILLECTOMY       A IV Location/Drains/Wounds Patient Lines/Drains/Airways Status     Active Line/Drains/Airways     Name Placement date Placement time Site Days   Implanted Port 04/27/20 Chest 04/27/20  --  Chest  1060   Implanted Port 11/17/22 Right Chest 11/17/22  1546  Chest  126   Peripheral IV 03/23/23 20 G Anterior;Right Forearm 03/23/23  1228  Forearm  less than 1   Closed System Drain 1 Left;Medial Breast Bulb (JP) 19 Fr. 09/14/20  1136  Breast  920   Closed System Drain 2 Left;Lateral Breast Bulb (JP) 19 Fr. 09/14/20  1137  Breast  920            Intake/Output Last 24 hours No intake or output data in the 24 hours ending 03/23/23 1946  Labs/Imaging Results for orders placed or performed during the hospital encounter of 03/23/23 (from the past 48 hour(s))  CBC with Differential     Status: None   Collection Time: 03/23/23 12:41 PM  Result Value Ref Range   WBC 4.7 4.0 - 10.5 K/uL   RBC 4.40 3.87 - 5.11 MIL/uL   Hemoglobin 12.8 12.0 - 15.0 g/dL   HCT 16.1 09.6 - 04.5 %   MCV 88.9 80.0 - 100.0 fL   MCH 29.1 26.0 - 34.0 pg   MCHC 32.7 30.0 - 36.0 g/dL   RDW 40.9 81.1 - 91.4 %   Platelets 193 150 - 400 K/uL   nRBC 0.0 0.0 - 0.2 %   Neutrophils Relative % 69 %   Neutro Abs 3.2 1.7 - 7.7 K/uL   Lymphocytes Relative 23 %    Lymphs Abs 1.1 0.7 - 4.0 K/uL   Monocytes Relative 8 %   Monocytes Absolute 0.4 0.1 - 1.0 K/uL   Eosinophils Relative 0 %   Eosinophils Absolute 0.0 0.0 - 0.5 K/uL   Basophils Relative 0 %   Basophils Absolute 0.0 0.0 - 0.1 K/uL   Immature Granulocytes 0 %   Abs Immature Granulocytes 0.02 0.00 - 0.07 K/uL  Comment: Performed at [facility], [address] [place], [place] [address]  Comprehensive metabolic panel     Status: Abnormal   Collection Time: 03/23/23 12:41 PM  Result Value Ref Range   Sodium 137 135 - 145 mmol/L   Potassium 3.2 (L) 3.5 - 5.1 mmol/L   Chloride 105 98 - 111 mmol/L   CO2 22 22 - 32 mmol/L   Glucose, Bld 142 (H) 70 - 99 mg/dL    Comment: Glucose reference range applies only to samples taken after fasting for at least 8 hours.   BUN 9 8 - 23 mg/dL   Creatinine, Ser 1.91 0.44 - 1.00 mg/dL   Calcium 9.2 8.9 - 47.8 mg/dL   Total Protein 6.5 6.5 - 8.1 g/dL   Albumin 3.5 3.5 - 5.0 g/dL   AST 31 15 - 41 U/L   ALT 16 0 - 44 U/L   Alkaline Phosphatase 87 38 - 126 U/L   Total Bilirubin 0.6 0.3 - 1.2 mg/dL   GFR, Estimated >29 >56 mL/min    Comment: (NOTE) Calculated using the CKD-EPI Creatinine Equation (2021)    Anion gap 10 5 - 15    Comment: Performed at [facility], [address] [place], [place] [address]  Lipase, blood     Status: None   Collection Time: 03/23/23 12:41 PM  Result Value Ref Range   Lipase 34 11 - 51 U/L    Comment: Performed at [facility], [address] [place], [place] [address]   CT ABDOMEN PELVIS WO CONTRAST  Result Date: 03/23/2023 CLINICAL DATA:  Acute abdominal pain.  Metastatic breast cancer. EXAM: CT ABDOMEN AND PELVIS WITHOUT CONTRAST TECHNIQUE: Multidetector CT imaging of the abdomen and pelvis was performed following the standard protocol without IV contrast. RADIATION DOSE REDUCTION: This exam was performed according to the departmental dose-optimization program which includes automated exposure  control, adjustment of the mA and/or kV according to patient size and/or use of iterative reconstruction technique. COMPARISON:  None Available.  01/19/2023 FINDINGS: Lower chest: Unremarkable. Hepatobiliary: 11 x 10 mm hypodensity in the dome of the liver was measured previously at 3.2 x 2.6 cm and characterized as metastases. There is no evidence for gallstones, gallbladder wall thickening, or pericholecystic fluid. No intrahepatic or extrahepatic biliary dilation. Pancreas: No focal mass lesion. No dilatation of the main duct. No intraparenchymal cyst. No peripancreatic edema. Spleen: No splenomegaly. No focal mass lesion. Adrenals/Urinary Tract: Stable bilateral adrenal thickening without discrete nodule or mass. Right kidney unremarkable. Small cyst upper pole left kidney is stable since prior. No followup imaging is recommended. No evidence for hydroureter. The urinary bladder appears normal for the degree of distention. Stomach/Bowel: Tiny hiatal hernia. Stomach otherwise unremarkable. Duodenum is normally positioned as is the ligament of Treitz. No small bowel wall thickening. No small bowel dilatation. The terminal ileum is normal. The appendix is not well visualized, but there is no edema or inflammation in the region of the cecum. No gross colonic mass. No colonic wall thickening. Prominent stool volume noted right, transverse, and proximal descending colon. Vascular/Lymphatic: There is moderate atherosclerotic calcification of the abdominal aorta. Infrarenal abdominal aorta measures 3.1 cm diameter. There is no gastrohepatic or hepatoduodenal ligament lymphadenopathy. The portal caval lymph node of concern previously is within normal limits for size today. No retroperitoneal or mesenteric lymphadenopathy. No pelvic sidewall lymphadenopathy. Reproductive: Hysterectomy.  There is no adnexal mass. Other: No intraperitoneal free fluid. Musculoskeletal: No worrisome lytic or  sclerotic osseous abnormality.  IMPRESSION: 1. No acute findings in the abdomen or pelvis. Specifically, no findings to explain the patient's history of abdominal pain. 2. 11 x 10 mm hypodensity in the dome of the liver was measured previously at 3.2 x 2.6 cm and characterized as metastases. 3. Index portacaval node measured previously is unenlarged by CT size criteria today. 4. Prominent stool volume. Imaging features could be compatible with constipation in the appropriate clinical setting. 5. Tiny hiatal hernia. 6. Infrarenal abdominal aortic aneurysm. Recommend follow-up ultrasound every 3 years. This recommendation follows ACR consensus guidelines: White Paper of the ACR Incidental Findings Committee II on Vascular Findings. J Am Coll Radiol 2013; 10:789-794. 7.  Aortic Atherosclerosis (ICD10-I70.0). Electronically Signed   By: Kennith Center M.D.   On: 03/23/2023 14:48   DG Chest Port 1 View  Result Date: 03/23/2023 CLINICAL DATA:  Abdominal distention. History of metastatic breast cancer. EXAM: PORTABLE CHEST 1 VIEW COMPARISON:  X-ray chest 03/21/2023.  CT scan 01/19/2023 FINDINGS: Enlarged cardiopericardial silhouette with a calcified aorta left upper chest defibrillator with leads overlying the right side of the heart. Right-sided IJ chest port with tip along the SVC right atrial junction region. The port is accessed. No consolidation, pneumothorax or effusion. No edema. Old rib fractures. Overlapping cardiac leads. Surgical clips in the left axillary region. IMPRESSION: Enlarged heart.  Chest port.  Defibrillator. Electronically Signed   By: Karen Kays M.D.   On: 03/23/2023 12:51   EEG adult now  Result Date: 03/22/2023 Charlsie Quest, MD     03/22/2023  8:35 AM Patient Name: KELLE BROWDER MRN: [id number] Epilepsy Attending: Charlsie Quest Referring Physician/Provider: [place], Eric J, DO Date: 03/22/2023 Duration: 25.49 mins Patient history: 82 y.o. female with a pertinent medical history of AICD, left breast cancer, liver mets,  Crohns, diabetes, heart failure, seizure , and thyroid disease who was brought to the [facility] ED after she was found seizing in the cancer center parking lot. EEG to evaluate for seizure Level of alertness: Awake AEDs during EEG study: LEV Technical aspects: This EEG study was done with scalp electrodes positioned according to the 10-20 International system of electrode placement. Electrical activity was reviewed with band pass filter of 1-70Hz , sensitivity of 7 uV/mm, display speed of 1mm/sec with a 60Hz  notched filter applied as appropriate. EEG data were recorded continuously and digitally stored.  Video monitoring was available and reviewed as appropriate. Description: The posterior dominant rhythm consists of 9 Hz activity of moderate voltage (25-35 uV) seen predominantly in posterior head regions, symmetric and reactive to eye opening and eye closing. Hyperventilation and photic stimulation were not performed.   IMPRESSION: This study is within normal limits. No seizures or epileptiform discharges were seen throughout the recording. A normal interictal EEG does not exclude the diagnosis of epilepsy. Charlsie Quest   DG CHEST PORT 1 VIEW  Result Date: 03/21/2023 CLINICAL DATA:  Shortness of breath EXAM: PORTABLE CHEST 1 VIEW COMPARISON:  12/11/2022 FINDINGS: Right-sided central venous port tip over the cavoatrial region. Left-sided single lead pacing device as before. Borderline cardiomegaly with aortic atherosclerosis. No acute airspace disease, pleural effusion, or pneumothorax. IMPRESSION: No active disease. Borderline cardiomegaly. Electronically Signed   By: Jasmine Pang M.D.   On: 03/21/2023 21:59    Pending Labs Unresulted Labs (From admission, onward)     Start     Ordered   03/30/23 0500  Creatinine, serum  (enoxaparin (LOVENOX)    CrCl >/= 30 ml/min)  Weekly,   R     Comments: while on enoxaparin therapy    03/23/23 1931   03/24/23 0500  Lipid panel  Tomorrow morning,   R        03/23/23  1907   03/23/23 1931  CBC  (enoxaparin (LOVENOX)    CrCl >/= 30 ml/min)  Once,   R       Comments: Baseline for enoxaparin therapy IF NOT ALREADY DRAWN.  Notify MD if PLT < 100 K.    03/23/23 1931   03/23/23 1931  Creatinine, serum  (enoxaparin (LOVENOX)    CrCl >/= 30 ml/min)  Once,   R       Comments: Baseline for enoxaparin therapy IF NOT ALREADY DRAWN.    03/23/23 1931            Vitals/Pain Today's Vitals   03/23/23 1230 03/23/23 1315 03/23/23 1630 03/23/23 1915  BP:  (!) 191/74 (!) 179/108 (!) 170/81  Pulse:  66 78 70  Resp:  17 17 19   Temp:    98.6 F (37 C)  TempSrc:    Oral  SpO2:  98% 100% 99%  Weight: 61.2 kg     Height: 5\' 2"  (1.575 m)     PainSc:        Isolation Precautions No active isolations  Medications Medications  metoprolol tartrate (LOPRESSOR) tablet 25 mg (25 mg Oral Given 03/23/23 1916)  losartan (COZAAR) tablet 50 mg (50 mg Oral Given 03/23/23 1916)  enoxaparin (LOVENOX) injection 40 mg (has no administration in time range)  lidocaine-prilocaine (EMLA) cream 1 Application (has no administration in time range)  levETIRAcetam (KEPPRA) tablet 750 mg (has no administration in time range)  liothyronine (CYTOMEL) tablet 25 mcg (has no administration in time range)  potassium chloride SA (KLOR-CON M) CR tablet 20 mEq (has no administration in time range)  furosemide (LASIX) tablet 20 mg (has no administration in time range)  potassium chloride SA (KLOR-CON M) CR tablet 40 mEq (40 mEq Oral Given 03/23/23 1747)    Mobility walks with device     Focused Assessments Cardiac Assessment Handoff:    Lab Results  Component Value Date   CKTOTAL 137 02/04/2020   TROPONINI 0.07 (H) 02/07/2016   Lab Results  Component Value Date   DDIMER 3.41 (H) 02/07/2016   Does the Patient currently have chest pain? No    R Recommendations: See Admitting Provider Note  Report given to:   Additional Notes: Pt had her ICD fire 3 times, has an elevated  troponin, being followed by cardiology. Pleasant, alert, oriented.

## 2023-03-23 NOTE — Consult Note (Addendum)
Cardiology Consultation   Patient ID: Colleen Lowery MRN: [id number]; DOB: Feb 11, 1941  Admit date: 03/23/2023 Date of Consult: 03/23/2023  PCP:  Colleen Kern, MD   [facility] Providers Cardiologist:  None   Click here to update MD or APP on Care Team, Refresh:1}   Dr. Scarlette Shorts Danton Clap Geraldine Contras  Patient Profile:   Colleen Lowery is a 82 y.o. 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  History of Present Illness:   Colleen Lowery was discharged yesterday after being admitted 03/21/23 for an unresponsive episode.  She was reportedly outside the cancer center on a bench unresponsive and shaking.  This occurred immediately after completing an infusion.  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  In the ED she was tachycardic and hypertensive but labs and head CT were unremarkable other than a remote thalamic lacular infarct.  She was seen by Neurology and her Keppra was increased.  She was discharged on 5/9.    She presented today with constipation, abdominal pain and abdominal distension. Cardiology consulted because patient thinks her defibrillator fired three times at 2am.  Device interrogation revealed an episode of treated VT with 3 40J shocks at 2:40 am.  She also had episodes of SVT on 5/8 and several NSVT episodes.  In the ED potassium 3.2.  Calcium  8.6.  TSH 0.193 but free T4 wnl. Telemetry reveals sinus rhythm with multiple PVCs and short runs of NSVT.    Her device was last interrogated in clinic 02/2023 and was functioning normally.  She had episodes of NSVT, up to 4 beats.  She was previously on sotalol but is not currently on any nodal agents or antiarrhythmics. She had breakthrough monomorphic VT on sotalol.  She  discussed continuing sotalol, switching to amiodarone and VT ablation with her EP.  She decided to continue current therapy.  However, sotalol is not seen on her subsequent medication lists and she does not recall seeing it lately.     She had a L sided ICD that was extracted and placed on the right.  This was extracted 08/2022 due to neuropathy and R am pain and a new device was again implanted on the L.   Ms. Silverio last saw [facility] in 2017. She notes that she transferred care due to Purell allergy and had no issues with her care.  She saw Dr. Allyson Sabal in 2017 for claudication in the setting of occluded L SFA. She was not interested in revascularization at that time. She continues to smoke 1ppd.   She has been feeling well from a cardiac standpoint and denies CP, shortness of breath, edema, orthopnea or PND.   Past Medical History:  Diagnosis Date   AICD (automatic cardioverter/defibrillator) present    Allergy    Arthritis    FINGERS   Cancer (HCC)    left breast   Cataract    right   Crohn disease (HCC)    Depression    Diabetes mellitus without complication (HCC)    TYPE 2 , TAKING OTC MED FORGYMEMA   GERD (gastroesophageal reflux disease)    Headache    sinus   Heart failure, acute, systolic and diastolic (HCC) MARCH 2017 DX   History  of colonic polyps    Hypertension    Hypothyroidism    per patient, takes OTC meds when temperature drops low   IBS (irritable bowel syndrome)    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   Seizures (HCC)    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, 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   Thyroid disease    AGE 15'S UNTIL  2016 OFF ALL THRYOID MEDS NOW    Wears dentures    Wears glasses     Past Surgical History:  Procedure Laterality Date   ABDOMINAL HYSTERECTOMY     PARTIAL   BREAST BIOPSY Left 04/01/2019   INVASIVE DUCTAL CARCINOMA   BREAST SURGERY     CARDIAC CATHETERIZATION  02/10/2016   Procedure: Right/Left Heart Cath and Coronary Angiography;  Surgeon: Marykay Lex, MD;  Location: [facility] INVASIVE CV LAB;  Service: Cardiovascular;;   CATARACT EXTRACTION W/ INTRAOCULAR LENS IMPLANT     left   COLONOSCOPY WITH PROPOFOL N/A 02/16/2017   Procedure: COLONOSCOPY WITH PROPOFOL;  Surgeon: Jeani Hawking, MD;  Location: [facility] ENDOSCOPY;  Service: Endoscopy;  Laterality: N/A;   COLONOSCOPY WITH PROPOFOL N/A 08/12/2021   Procedure: COLONOSCOPY WITH PROPOFOL;  Surgeon: Jeani Hawking, MD;  Location: [facility] ENDOSCOPY;  Service: Endoscopy;  Laterality: N/A;   EP IMPLANTABLE DEVICE N/A 02/10/2016   Procedure: ICD Implant;  Surgeon: Will Jorja Loa, MD;  Location: [facility] INVASIVE CV LAB;  Service: Cardiovascular;  Laterality: N/A;   ESOPHAGOGASTRODUODENOSCOPY (EGD) WITH PROPOFOL N/A 02/16/2017   Procedure: ESOPHAGOGASTRODUODENOSCOPY (EGD) WITH PROPOFOL;  Surgeon: Jeani Hawking, MD;  Location: [facility] ENDOSCOPY;  Service: Endoscopy;  Laterality: N/A;  reflux   IR IMAGING GUIDED PORT INSERTION  04/27/2020   IR IMAGING GUIDED PORT INSERTION  11/17/2022   MASTECTOMY Left    MASTECTOMY MODIFIED RADICAL Left 09/14/2020   Procedure: LEFT MASTECTOMY MODIFIED RADICAL;  Surgeon: Harriette Bouillon, MD;  Location: [facility] OR;  Service: General;  Laterality: Left;  PEC BLOCK   POLYPECTOMY  08/12/2021   Procedure: POLYPECTOMY;  Surgeon: Jeani Hawking, MD;  Location: [facility] ENDOSCOPY;  Service: Endoscopy;;   TONSILLECTOMY       Home Medications:  Prior to Admission medications   Medication Sig Start Date End Date Taking? Authorizing Provider  alendronate (FOSAMAX) 70 MG tablet Take 1 tablet (70 mg total) by mouth once a week. Take with a full glass of water on an empty stomach. 05/24/22  Yes Serena Croissant, MD  Ascorbic Acid (VITAMIN C PO) Take 2,000 mg by mouth daily.   Yes [provider]  B Complex Vitamins (VITAMIN B COMPLEX) TABS Take 2 tablets by mouth daily.   Yes [provider]  Beta Carotene (VITAMIN A) 25000 UNIT capsule Take 50,000 Units by mouth daily.   Yes [provider]  Cholecalciferol (VITAMIN D3) 5000 units CAPS Take 5,000 Units by mouth daily. 2500 units per cap   Yes [provider]  dicyclomine (BENTYL) 20 MG tablet Take 1 tablet (20 mg total) by mouth every 6 (six) hours as needed for spasms (stomach cramps). 03/23/23  Yes Harris, Abigail, PA-C  Flaxseed, Linseed, (FLAXSEED OIL) 1000 MG CAPS Take 3,000 mg by mouth daily. Taking as needed   Yes [provider]  furosemide (LASIX) 20 MG tablet Take 20 mg by mouth daily.   Yes [provider]  levETIRAcetam (KEPPRA) 750 MG tablet Take 1 tablet (750 mg total) by mouth 2 (two) times daily. 03/22/23 04/21/23 Yes Hughie Closs, MD  lidocaine-prilocaine (EMLA) cream Apply to affected  area once Patient taking differently: Apply 1 Application topically daily as needed (For pain). 01/08/23  Yes Serena Croissant, MD  liothyronine (CYTOMEL) 25 MCG tablet Take 25 mcg by mouth daily. 01/08/23  Yes [provider]  losartan (COZAAR) 50 MG tablet Take 50 mg by mouth daily. 12/16/19  Yes [provider]  MAGNESIUM PO Take 5 mLs by mouth daily. Liquid 2.5 ml/150 mg   Yes [provider]  ondansetron (ZOFRAN) 8 MG tablet Take 1 tablet (8 mg total) by mouth every 8 (eight) hours as needed for nausea or vomiting. Take 1 tablets (8mg  total) by mouth every 8hrs as needed. Start on the third day after chemotherapy. 10/31/22  Yes Serena Croissant, MD  OVER THE COUNTER MEDICATION Take 1 Scoop by mouth daily. Medication: Branched chain amino acids 4000  Patient states "taking for muscles"   Yes [provider]  OVER THE COUNTER MEDICATION Take 400-800 mg by mouth daily as needed (Based on Blood glucose). Medication: gymnema sylvestre Patient takes for "lowering blood glucose"   Yes [provider]  OVER THE COUNTER MEDICATION Take 5 capsules by mouth daily. Medication: Pau d' arco 500 mg each  Patient states now taking liquid and "keeps mold down"   Yes [provider]  OVER THE COUNTER MEDICATION Take 6 capsules by mouth daily. Medication: Caprylic acid Patient taking to "lower candida risk"   Yes [provider]  OVER THE COUNTER MEDICATION Take 5 capsules by mouth daily. Medication: Pancreatic enzymes Patient taking "to get fluid out"   Yes [provider]  OVER THE COUNTER MEDICATION Take 3 capsules by mouth daily. Raw Adrenal Patient taking for "overall energy"   Yes [provider]  OVER THE COUNTER MEDICATION Take 6 capsules by mouth daily. Beef Pancreas Patient taking for "pancreatic function improvement and lymphatic system improvement"   Yes [provider]  PANTETHINE PO Take 1-2 tablets by mouth daily as needed (Itching). Medication: Vitamin B5   Yes [provider]  polyethylene glycol powder (GLYCOLAX/MIRALAX) 17 GM/SCOOP powder Take 17 g by mouth 2 (two) times daily. 03/23/23  Yes Harris, Abigail, PA-C  potassium chloride SA (K-DUR,KLOR-CON) 20 MEQ tablet Take 20 mEq by mouth 2 (two) times daily.   Yes [provider]  prochlorperazine (COMPAZINE) 10 MG tablet Take 1 tablet (10 mg total) by mouth every 6 (six) hours as needed for nausea or vomiting. 10/31/22  Yes Serena Croissant, MD  PROLENSA 0.07 % SOLN Place 1 drop into the right eye 4 (four) times daily. 08/15/22  Yes [provider]  Propylene Glycol (SYSTANE BALANCE) 0.6 % SOLN Place 2 drops into the right eye in the morning and at bedtime.   Yes [provider]  CALCIUM PO Take 10 mLs by mouth daily. Liquid 300 MG /5 ML Patient not taking: Reported on 03/23/2023    [provider]  Homeopathic Products (ARNICARE PAIN RELIEF EX) Apply 1 application  topically daily as needed (pain).    [provider]  loperamide (IMODIUM) 2 MG capsule Take 2 tabs by mouth with first loose stool, then 1 tab with each additional loose stool as needed. Do not exceed 8 tabs in a 24-hour period Patient not taking:  Reported on 03/23/2023 10/31/22   Serena Croissant, MD  Misc Natural Products (BLOOD SUGAR BALANCE PO) Take 3 capsules by mouth daily. Blood Sugar Manager    [provider]  TRUE METRIX BLOOD GLUCOSE TEST test strip SMARTSIG:Via Meter 09/28/22  [provider]  TRUEplus Lancets 33G MISC  09/28/22   [provider]    Inpatient Medications: Scheduled Meds:  Continuous Infusions:  PRN Meds:   Allergies:    Allergies  Allergen Reactions   Alcohol Anaphylaxis and Swelling    Purell hand sanitizer  Swelling in the luNGS, CHEST AND THROAT and seizures Problem with smell not the Alcohol Other reaction(s): Other (See Comments) Causes seizures and swelling   Contrast Media [Iodinated Contrast Media] Anaphylaxis    Throat closed, was 40 yrs ago  *Iodine    Iodine Anaphylaxis    Throat closed, was 40 yrs ago   Atorvastatin     Myalgia    Dust Mite Extract Swelling    SWELLING IS GI SYSTEM   Latex     QUESTIONABLE INTOLERABLE MUCOUS OOZING OUT OF THROAT   Lisinopril Other (See Comments)    Pt does not remember reaction    Molds & Smuts Swelling    SWELLING IS IN GI SYSTEM   Wound Dressing Adhesive Other (See Comments)    Causes skin to tear    Social History:   Social History   Socioeconomic History   Marital status: Widowed    Spouse name: Not on file   Number of children: Not on file   Years of education: Not on file   Highest education level: Not on file  Occupational History   Not on file  Tobacco Use   Smoking status: Every Day    Packs/day: 0.25    Years: 50.00    Additional pack years: 0.00    Total pack years: 12.50    Types: Cigarettes   Smokeless tobacco: Never   Tobacco comments:    3 puffs of each cigarette  Vaping Use   Vaping Use: Never used  Substance and Sexual Activity   Alcohol use: No   Drug use: No   Sexual activity: Not Currently  Other Topics Concern   Not on file  Social History Narrative   Not on file    Social Determinants of Health   Financial Resource Strain: Not on file  Food Insecurity: Not on file  Transportation Needs: Not on file  Physical Activity: Not on file  Stress: Not on file  Social Connections: Not on file  Intimate Partner Violence: Not on file    Family History:    Family History  Problem Relation Age of Onset   Diabetes Mother    Stroke Mother    Diabetes Brother    Diabetes Maternal Grandmother    Diabetes Paternal Grandfather    Heart failure Paternal Grandfather    Hypertension Brother    Heart attack Neg Hx      ROS:  Please see the history of present illness.   All other ROS reviewed and negative.     Physical Exam/Data:   Vitals:   03/23/23 1229 03/23/23 1230 03/23/23 1315 03/23/23 1630  BP: (!) 197/102  (!) 191/74 (!) 179/108  Pulse: 81  66 78  Resp: 17  17 17   Temp: 98.7 F (37.1 C)     TempSrc: Oral     SpO2: 100%  98% 100%  Weight:  61.2 kg    Height:  5\' 2"  (1.575 m)     No intake or output data in the 24 hours ending 03/23/23 1750    03/23/2023   12:30 PM 03/21/2023    4:10 PM 03/21/2023   10:25 AM  Last 3 Weights  Weight (lbs)  135 lb 132 lb 0.9 oz 132 lb  Weight (kg) 61.236 kg 59.9 kg 59.875 kg     VS:  BP (!) 179/108   Pulse 78   Temp 98.7 F (37.1 C) (Oral)   Resp 17   Ht 5\' 2"  (1.575 m)   Wt 61.2 kg   SpO2 100%   BMI 24.69 kg/m  , BMI Body mass index is 24.69 kg/m. GENERAL:  Well appearing HEENT: Pupils equal round and reactive, fundi not visualized, oral mucosa unremarkable NECK:  No jugular venous distention, waveform within normal limits, carotid upstroke brisk and symmetric, no bruits, no thyromegaly CHEST: R sided port LUNGS:  Clear to auscultation bilaterally HEART:  RRR.  PMI not displaced or sustained,S1 and S2 within normal limits, no S3, no S4, no clicks, no rubs, no murmurs ABD:  Flat, positive bowel sounds normal in frequency in pitch, no bruits, no rebound, no guarding, no midline pulsatile mass, no  hepatomegaly, no splenomegaly EXT:  2 plus pulses throughout, no edema, no cyanosis no clubbing SKIN:  No rashes no nodules NEURO:  Cranial nerves II through XII grossly intact, motor grossly intact throughout PSYCH:  Cognitively intact, oriented to person place and time   EKG:  The EKG was personally reviewed and demonstrates:  n/a Telemetry:  Telemetry was personally reviewed and demonstrates:  sinus rhythm.  Frequent PVCs.  NSVT  Relevant CV Studies:  Echo 01/2016: Study Conclusions   - Left ventricle: The cavity size was moderately to severely    dilated. There was mild concentric hypertrophy. Systolic function    was mildly to moderately reduced. The estimated ejection fraction    was in the range of 40% to 45%. Mild hypokinesis of the inferior    and inferoseptal myocardium. Features are consistent with a    pseudonormal left ventricular filling pattern, with concomitant    abnormal relaxation and increased filling pressure (grade 2    diastolic dysfunction). Doppler parameters are consistent with    elevated mean left atrial filling pressure.  - Aortic valve: There was no stenosis. There was no regurgitation.  - Mitral valve: Mobility of the anterior and posterior leaflet was    restricted. There was mild regurgitation. Effective regurgitant    orifice (PISA): 0.12 cm^2.  - Left atrium: The atrium was severely dilated.  - Right ventricle: The cavity size was normal. Wall thickness was    normal. Systolic function was normal.  - Pulmonary arteries: Systolic pressure was mildly increased. PA    peak pressure: 44 mm Hg (S).  - Inferior vena cava: The vessel was dilated. The respirophasic    diameter changes were in the normal range (>= 50%), consistent    with elevated central venous pressure.  - Pericardium, extracardiac: There was a left pleural effusion.   Laboratory Data:  High Sensitivity Troponin:  No results for input(s): "TROPONINIHS" in the last 720 hours.    Chemistry Recent Labs  Lab 03/21/23 0954 03/21/23 0954 03/21/23 1615 03/21/23 1621 03/21/23 2224 03/22/23 0727 03/23/23 1241  NA 142  --  138 140  --  139 137  K 3.5  --  3.5 3.6  --  3.4* 3.2*  CL 108  --  106 106  --  108 105  CO2 25  --  15*  --   --  21* 22  GLUCOSE 148*  --  281* 272*  --  148* 142*  BUN 9  --  12 11  --  8 9  CREATININE 0.77   < > 1.01* 0.70 0.76 0.70 0.79  CALCIUM 8.6*  --  8.8*  --   --  8.6* 9.2  MG 2.0  --  1.9  --   --  2.0  --   GFRNONAA >60   < > 57*  --  >60 >60 >60  ANIONGAP 9  --  17*  --   --  10 10   < > = values in this interval not displayed.    Recent Labs  Lab 03/21/23 0954 03/22/23 0727 03/23/23 1241  PROT 6.9 6.2* 6.5  ALBUMIN 4.0 3.3* 3.5  AST 16 21 31   ALT 9 14 16   ALKPHOS 93 80 87  BILITOT 0.3 0.5 0.6   Lipids No results for input(s): "CHOL", "TRIG", "HDL", "LABVLDL", "LDLCALC", "CHOLHDL" in the last 168 hours.  Hematology Recent Labs  Lab 03/21/23 2224 03/22/23 0727 03/23/23 1241  WBC 3.0* 5.1 4.7  RBC 4.60 4.32 4.40  HGB 13.4 12.9 12.8  HCT 40.8 37.3 39.1  MCV 88.7 86.3 88.9  MCH 29.1 29.9 29.1  MCHC 32.8 34.6 32.7  RDW 14.6 14.6 15.0  PLT 203 199 193   Thyroid  Recent Labs  Lab 03/21/23 2224  TSH 0.193*  FREET4 0.68    BNPNo results for input(s): "BNP", "PROBNP" in the last 168 hours.  DDimer No results for input(s): "DDIMER" in the last 168 hours.   Radiology/Studies:  CT ABDOMEN PELVIS WO CONTRAST  Result Date: 03/23/2023 CLINICAL DATA:  Acute abdominal pain.  Metastatic breast cancer. EXAM: CT ABDOMEN AND PELVIS WITHOUT CONTRAST TECHNIQUE: Multidetector CT imaging of the abdomen and pelvis was performed following the standard protocol without IV contrast. RADIATION DOSE REDUCTION: This exam was performed according to the departmental dose-optimization program which includes automated exposure control, adjustment of the mA and/or kV according to patient size and/or use of iterative reconstruction  technique. COMPARISON:  None Available.  01/19/2023 FINDINGS: Lower chest: Unremarkable. Hepatobiliary: 11 x 10 mm hypodensity in the dome of the liver was measured previously at 3.2 x 2.6 cm and characterized as metastases. There is no evidence for gallstones, gallbladder wall thickening, or pericholecystic fluid. No intrahepatic or extrahepatic biliary dilation. Pancreas: No focal mass lesion. No dilatation of the main duct. No intraparenchymal cyst. No peripancreatic edema. Spleen: No splenomegaly. No focal mass lesion. Adrenals/Urinary Tract: Stable bilateral adrenal thickening without discrete nodule or mass. Right kidney unremarkable. Small cyst upper pole left kidney is stable since prior. No followup imaging is recommended. No evidence for hydroureter. The urinary bladder appears normal for the degree of distention. Stomach/Bowel: Tiny hiatal hernia. Stomach otherwise unremarkable. Duodenum is normally positioned as is the ligament of Treitz. No small bowel wall thickening. No small bowel dilatation. The terminal ileum is normal. The appendix is not well visualized, but there is no edema or inflammation in the region of the cecum. No gross colonic mass. No colonic wall thickening. Prominent stool volume noted right, transverse, and proximal descending colon. Vascular/Lymphatic: There is moderate atherosclerotic calcification of the abdominal aorta. Infrarenal abdominal aorta measures 3.1 cm diameter. There is no gastrohepatic or hepatoduodenal ligament lymphadenopathy. The portal caval lymph node of concern previously is within normal limits for size today. No retroperitoneal or mesenteric lymphadenopathy. No pelvic sidewall lymphadenopathy. Reproductive: Hysterectomy.  There is no adnexal mass. Other: No intraperitoneal free fluid. Musculoskeletal: No worrisome lytic or sclerotic osseous abnormality. IMPRESSION: 1. No acute findings in the abdomen or pelvis. Specifically, no findings to explain the  patient's history of abdominal pain. 2. 11 x 10 mm hypodensity in the dome of the liver was measured previously at 3.2 x 2.6 cm and characterized as metastases. 3. Index portacaval node measured previously is unenlarged by CT size criteria today. 4. Prominent stool volume. Imaging features could be compatible with constipation in the appropriate clinical setting. 5. Tiny hiatal hernia. 6. Infrarenal abdominal aortic aneurysm. Recommend follow-up ultrasound every 3 years. This recommendation follows ACR consensus guidelines: White Paper of the ACR Incidental Findings Committee II on Vascular Findings. J Am Coll Radiol 2013; 10:789-794. 7.  Aortic Atherosclerosis (ICD10-I70.0). Electronically Signed   By: Kennith Center M.D.   On: 03/23/2023 14:48   DG Chest Port 1 View  Result Date: 03/23/2023 CLINICAL DATA:  Abdominal distention. History of metastatic breast cancer. EXAM: PORTABLE CHEST 1 VIEW COMPARISON:  X-ray chest 03/21/2023.  CT scan 01/19/2023 FINDINGS: Enlarged cardiopericardial silhouette with a calcified aorta left upper chest defibrillator with leads overlying the right side of the heart. Right-sided IJ chest port with tip along the SVC right atrial junction region. The port is accessed. No consolidation, pneumothorax or effusion. No edema. Old rib fractures. Overlapping cardiac leads. Surgical clips in the left axillary region. IMPRESSION: Enlarged heart.  Chest port.  Defibrillator. Electronically Signed   By: Karen Kays M.D.   On: 03/23/2023 12:51   EEG adult now  Result Date: 03/22/2023 Charlsie Quest, MD     03/22/2023  8:35 AM Patient Name: Colleen Lowery MRN: [id number] Epilepsy Attending: Charlsie Quest Referring Physician/Provider: [place], Eric J, DO Date: 03/22/2023 Duration: 25.49 mins Patient history: 82 y.o. female with a pertinent medical history of AICD, left breast cancer, liver mets, Crohns, diabetes, heart failure, seizure , and thyroid disease who was brought to the [facility] ED after  she was found seizing in the cancer center parking lot. EEG to evaluate for seizure Level of alertness: Awake AEDs during EEG study: LEV Technical aspects: This EEG study was done with scalp electrodes positioned according to the 10-20 International system of electrode placement. Electrical activity was reviewed with band pass filter of 1-70Hz , sensitivity of 7 uV/mm, display speed of 66mm/sec with a 60Hz  notched filter applied as appropriate. EEG data were recorded continuously and digitally stored.  Video monitoring was available and reviewed as appropriate. Description: The posterior dominant rhythm consists of 9 Hz activity of moderate voltage (25-35 uV) seen predominantly in posterior head regions, symmetric and reactive to eye opening and eye closing. Hyperventilation and photic stimulation were not performed.   IMPRESSION: This study is within normal limits. No seizures or epileptiform discharges were seen throughout the recording. A normal interictal EEG does not exclude the diagnosis of epilepsy. Charlsie Quest   DG CHEST PORT 1 VIEW  Result Date: 03/21/2023 CLINICAL DATA:  Shortness of breath EXAM: PORTABLE CHEST 1 VIEW COMPARISON:  12/11/2022 FINDINGS: Right-sided central venous port tip over the cavoatrial region. Left-sided single lead pacing device as before. Borderline cardiomegaly with aortic atherosclerosis. No acute airspace disease, pleural effusion, or pneumothorax. IMPRESSION: No active disease. Borderline cardiomegaly. Electronically Signed   By: Jasmine Pang M.D.   On: 03/21/2023 21:59   CT Head Wo Contrast  Result Date: 03/21/2023 CLINICAL DATA:  Head trauma, minor. EXAM: CT HEAD WITHOUT CONTRAST TECHNIQUE: Contiguous axial images were obtained from the base of the skull through the vertex without intravenous contrast. RADIATION DOSE REDUCTION: This exam was performed according to the departmental dose-optimization program which includes automated exposure control, adjustment  of the  mA and/or kV according to patient size and/or use of iterative reconstruction technique. COMPARISON:  Head CT 12/11/2022. FINDINGS: Brain: No acute intracranial hemorrhage. Old lacunar infarct in the left thalamus is unchanged. Gray-white differentiation is otherwise preserved. No hydrocephalus or extra-axial collection. No mass effect or midline shift. Vascular: No hyperdense vessel or unexpected calcification. Skull: No calvarial fracture or suspicious bone lesion. Skull base is unremarkable. Sinuses/Orbits: Unremarkable. Other: None. IMPRESSION: 1. No evidence of acute intracranial injury. 2. Old left thalamic lacunar infarct. Electronically Signed   By: Orvan Falconer M.D.   On: 03/21/2023 16:56     Assessment and Plan:   # HFrEF:  # Hypertension:  Recent LVEF unknown.  It was 40-45% 01/2016.  Will repeat echo.  She is euvolemic on exam.  Will start metoprolol 25mg  bid.  Consolidate to succinate if she remains on it.  Resume losartan.  BP very uncontrolled.  Will need to titrate GDMT as BP and renal function allow.  Recent notes report NICM.  However, given h/o occluded RCA, likely mixed etiology.   # VT: # s/p ICD:  Unclear whether she is actually on sotalol.  It seems to have fallen off her list.  There is mention that she had breakthrough with short runs of NSVT on the sotalol, but it doesn't seem that she was supposed to stop it.  Adding metoprolol as above.  EP to see tomorrow and decide whether to resume sotalol, switch to amiodarone.  There was consideration for VT ablation but I doubt this is on the table now in the midst of treatment for metastatic breast cancer.   # CAD:  # Hyperlipidemia: # PAD:  Known occluded RCA. Statin intolerant.  Last known lipids from 2020 were uncontrolled.  Check fasting lipids in AM.  Consider statin alternatives as an outpatient.  Unclear why she isn't on aspirin.   # Seizures:  Per primary team.  # Constipation: Patient reports this is worse 2/2  chemo.   # Metastatic breast cancer:  Ongoing chemotherapy.   # Tobacco abuse;  Smokes 1 ppd.  Encourage her to abstain.  She cannot use patches 2/2 adhesive allergy.   Risk Assessment/Risk Scores:        New York Heart Association (NYHA) Functional Class NYHA Class II      For questions or updates, please contact [facility] Please consult [email or web address] for contact info under    Signed, Chilton Si, MD  03/23/2023 5:50 PM

## 2023-03-23 NOTE — Discharge Instructions (Signed)
Contact a health care provider if: ?You have pain that gets worse. ?You have a fever. ?You do not have a bowel movement after 4 days. ?You vomit. ?You are not hungry or you lose weight. ?You are bleeding from the opening between the buttocks (anus). ?You have thin, pencil-like stools. ?Get help right away if: ?You have a fever and your symptoms suddenly get worse. ?You leak stool or have blood in your stool. ?Your abdomen is bloated. ?You have severe pain in your abdomen. ?You feel dizzy or you faint. ?

## 2023-03-24 ENCOUNTER — Other Ambulatory Visit (HOSPITAL_COMMUNITY): Payer: Medicare HMO

## 2023-03-24 DIAGNOSIS — I472 Ventricular tachycardia, unspecified: Secondary | ICD-10-CM | POA: Diagnosis not present

## 2023-03-24 DIAGNOSIS — I5022 Chronic systolic (congestive) heart failure: Secondary | ICD-10-CM | POA: Diagnosis not present

## 2023-03-24 DIAGNOSIS — Z4502 Encounter for adjustment and management of automatic implantable cardiac defibrillator: Secondary | ICD-10-CM | POA: Diagnosis not present

## 2023-03-24 DIAGNOSIS — I5020 Unspecified systolic (congestive) heart failure: Secondary | ICD-10-CM | POA: Diagnosis not present

## 2023-03-24 DIAGNOSIS — K5900 Constipation, unspecified: Secondary | ICD-10-CM | POA: Diagnosis not present

## 2023-03-24 LAB — CBC
HCT: 35.9 % — ABNORMAL LOW (ref 36.0–46.0)
Hemoglobin: 11.6 g/dL — ABNORMAL LOW (ref 12.0–15.0)
MCH: 29 pg (ref 26.0–34.0)
MCHC: 32.3 g/dL (ref 30.0–36.0)
MCV: 89.8 fL (ref 80.0–100.0)
Platelets: 184 10*3/uL (ref 150–400)
RBC: 4 MIL/uL (ref 3.87–5.11)
RDW: 14.9 % (ref 11.5–15.5)
WBC: 3.9 10*3/uL — ABNORMAL LOW (ref 4.0–10.5)
nRBC: 0 % (ref 0.0–0.2)

## 2023-03-24 LAB — LIPID PANEL
Cholesterol: 181 mg/dL (ref 0–200)
HDL: 42 mg/dL (ref >40)
LDL Cholesterol: 119 mg/dL — ABNORMAL HIGH (ref 0–99)
Total CHOL/HDL Ratio: 4.3 RATIO
Triglycerides: 101 mg/dL (ref <150)
VLDL: 20 mg/dL (ref 0–40)

## 2023-03-24 LAB — BASIC METABOLIC PANEL
Anion gap: 7 (ref 5–15)
BUN: 10 mg/dL (ref 8–23)
CO2: 23 mmol/L (ref 22–32)
Calcium: 8.6 mg/dL — ABNORMAL LOW (ref 8.9–10.3)
Chloride: 108 mmol/L (ref 98–111)
Creatinine, Ser: 0.85 mg/dL (ref 0.44–1.00)
GFR, Estimated: >60 mL/min (ref >60)
Glucose, Bld: 90 mg/dL (ref 70–99)
Potassium: 3.8 mmol/L (ref 3.5–5.1)
Sodium: 138 mmol/L (ref 135–145)

## 2023-03-24 LAB — PHOSPHORUS: Phosphorus: 3.3 mg/dL (ref 2.5–4.6)

## 2023-03-24 LAB — TSH: TSH: 1.974 u[IU]/mL (ref 0.350–4.500)

## 2023-03-24 LAB — MAGNESIUM: Magnesium: 1.9 mg/dL (ref 1.7–2.4)

## 2023-03-24 MED ORDER — CHLORHEXIDINE GLUCONATE CLOTH 2 % EX PADS
6.0000 | MEDICATED_PAD | Freq: Every day | CUTANEOUS | Status: DC
  Administered 2023-03-24 – 2023-03-25 (×2): 6 via TOPICAL

## 2023-03-24 MED ORDER — POLYETHYLENE GLYCOL 3350 17 G PO PACK
17.0000 g | PACK | Freq: Two times a day (BID) | ORAL | Status: DC
  Administered 2023-03-24 – 2023-03-25 (×3): 17 g via ORAL
  Filled 2023-03-24 (×3): qty 1

## 2023-03-24 MED ORDER — MAGNESIUM SULFATE 2 GM/50ML IV SOLN
2.0000 g | Freq: Once | INTRAVENOUS | Status: AC
  Administered 2023-03-24: 2 g via INTRAVENOUS
  Filled 2023-03-24: qty 50

## 2023-03-24 MED ORDER — FLEET ENEMA 7-19 GM/118ML RE ENEM: 1.0000 | ENEMA | Freq: Every day | RECTAL | Status: DC | PRN

## 2023-03-24 NOTE — Plan of Care (Signed)

## 2023-03-24 NOTE — Progress Notes (Signed)
Electrophysiology Progress Note  Patient Name: Colleen Lowery Date of Encounter: 03/24/2023   Electrophysiologist: Elberta Fortis   Subjective   Patient feels ok. She is a little overwhelmed with all her recent issues -- she is undergoing chemo, she has had a recent seizure, general malaise, and now shock.  Inpatient Medications    Scheduled Meds:  Chlorhexidine Gluconate Cloth  6 each Topical Daily   enoxaparin (LOVENOX) injection  40 mg Subcutaneous Q24H   furosemide  20 mg Oral Daily   levETIRAcetam  750 mg Oral BID   liothyronine  25 mcg Oral Daily   losartan  50 mg Oral Daily   metoprolol tartrate  25 mg Oral BID   polyethylene glycol  17 g Oral BID   potassium chloride SA  20 mEq Oral BID   senna-docusate  2 tablet Oral BID   sodium chloride flush  10-40 mL Intracatheter Q12H   Continuous Infusions:  PRN Meds: acetaminophen, hydrALAZINE, HYDROmorphone (DILAUDID) injection, lidocaine-prilocaine, melatonin, oxyCODONE, prochlorperazine, sodium chloride flush, sodium phosphate   Vital Signs    Vitals:   03/23/23 2100 03/24/23 0034 03/24/23 0432 03/24/23 1000  BP: (!) 181/91 (!) 121/59 138/69 (!) 167/93  Pulse: 73 65 81   Resp:  16 16 19   Temp: (!) 97.5 F (36.4 C)  98.2 F (36.8 C) (!) 97.3 F (36.3 C)  TempSrc: Oral  Oral Oral  SpO2: 99% 98% 92% 96%  Weight: 63.1 kg  59.6 kg   Height:       No intake or output data in the 24 hours ending 03/24/23 1351 Filed Weights   03/23/23 1230 03/23/23 2100 03/24/23 0432  Weight: 61.2 kg 63.1 kg 59.6 kg    Physical Exam    GEN- The patient is well appearing, alert and oriented x 3 today.   Lungs- Clear to ausculation bilaterally, normal work of breathing Heart- Regular rate and rhythm, no murmurs, rubs or gallops Extremities- no clubbing, cyanosis, or edema Neuro- strength and sensation are intact  Labs      Telemetry    Sinus rhythm with occasional PVCs (personally reviewed)  Radiology    CT ABDOMEN PELVIS  WO CONTRAST  Result Date: 03/23/2023 CLINICAL DATA:  Acute abdominal pain.  Metastatic breast cancer. EXAM: CT ABDOMEN AND PELVIS WITHOUT CONTRAST TECHNIQUE: Multidetector CT imaging of the abdomen and pelvis was performed following the standard protocol without IV contrast. RADIATION DOSE REDUCTION: This exam was performed according to the departmental dose-optimization program which includes automated exposure control, adjustment of the mA and/or kV according to patient size and/or use of iterative reconstruction technique. COMPARISON:  None Available.  01/19/2023 FINDINGS: Lower chest: Unremarkable. Hepatobiliary: 11 x 10 mm hypodensity in the dome of the liver was measured previously at 3.2 x 2.6 cm and characterized as metastases. There is no evidence for gallstones, gallbladder wall thickening, or pericholecystic fluid. No intrahepatic or extrahepatic biliary dilation. Pancreas: No focal mass lesion. No dilatation of the main duct. No intraparenchymal cyst. No peripancreatic edema. Spleen: No splenomegaly. No focal mass lesion. Adrenals/Urinary Tract: Stable bilateral adrenal thickening without discrete nodule or mass. Right kidney unremarkable. Small cyst upper pole left kidney is stable since prior. No followup imaging is recommended. No evidence for hydroureter. The urinary bladder appears normal for the degree of distention. Stomach/Bowel: Tiny hiatal hernia. Stomach otherwise unremarkable. Duodenum is normally positioned as is the ligament of Treitz. No small bowel wall thickening. No small bowel dilatation. The terminal ileum is normal. The appendix is  not well visualized, but there is no edema or inflammation in the region of the cecum. No gross colonic mass. No colonic wall thickening. Prominent stool volume noted right, transverse, and proximal descending colon. Vascular/Lymphatic: There is moderate atherosclerotic calcification of the abdominal aorta. Infrarenal abdominal aorta measures 3.1 cm  diameter. There is no gastrohepatic or hepatoduodenal ligament lymphadenopathy. The portal caval lymph node of concern previously is within normal limits for size today. No retroperitoneal or mesenteric lymphadenopathy. No pelvic sidewall lymphadenopathy. Reproductive: Hysterectomy.  There is no adnexal mass. Other: No intraperitoneal free fluid. Musculoskeletal: No worrisome lytic or sclerotic osseous abnormality. IMPRESSION: 1. No acute findings in the abdomen or pelvis. Specifically, no findings to explain the patient's history of abdominal pain. 2. 11 x 10 mm hypodensity in the dome of the liver was measured previously at 3.2 x 2.6 cm and characterized as metastases. 3. Index portacaval node measured previously is unenlarged by CT size criteria today. 4. Prominent stool volume. Imaging features could be compatible with constipation in the appropriate clinical setting. 5. Tiny hiatal hernia. 6. Infrarenal abdominal aortic aneurysm. Recommend follow-up ultrasound every 3 years. This recommendation follows ACR consensus guidelines: White Paper of the ACR Incidental Findings Committee II on Vascular Findings. J Am Coll Radiol 2013; 10:789-794. 7.  Aortic Atherosclerosis (ICD10-I70.0). Electronically Signed   By: Kennith Center M.D.   On: 03/23/2023 14:48   DG Chest Port 1 View  Result Date: 03/23/2023 CLINICAL DATA:  Abdominal distention. History of metastatic breast cancer. EXAM: PORTABLE CHEST 1 VIEW COMPARISON:  X-ray chest 03/21/2023.  CT scan 01/19/2023 FINDINGS: Enlarged cardiopericardial silhouette with a calcified aorta left upper chest defibrillator with leads overlying the right side of the heart. Right-sided IJ chest port with tip along the SVC right atrial junction region. The port is accessed. No consolidation, pneumothorax or effusion. No edema. Old rib fractures. Overlapping cardiac leads. Surgical clips in the left axillary region. IMPRESSION: Enlarged heart.  Chest port.  Defibrillator.  Electronically Signed   By: Karen Kays M.D.   On: 03/23/2023 12:51     Patient Profile     Colleen Lowery is a 82 y.o. 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    Assessment & Plan     VT - reported. I have not yet seen confirmation on device interrogation Treated with sotalol in the past -- this medication was discontinued for reasons unknown to [place] She has not seen her [facility] EP since 2017 I have requested device interrogation to review Will need to consider amiodarone   Medtronic single chamber Icd in place Device interrogation pending  Metastatic breast cancer Undergoing chemotherapy Has liver mets  HFrEF EF 40-45% in 2017 Repeat TTE pending  Hypertension BP uncontrolled  Stage IV breast cancer Liver metastases  Seizure disorder Longstanding history of seizure, recent admission Per primary  hypokalemia Keep K+ > 4   York Pellant MD 03/24/2023 1:51 PM    For questions or updates, please contact [facility] Please consult [email or web address] for contact info under Cardiology/STEMI.  Signed, Maurice Small, MD  03/24/2023, 1:51 PM

## 2023-03-24 NOTE — Progress Notes (Signed)
[facility] PROGRESS NOTE   Colleen Lowery ZOX:[id number] DOB: [birth year] DOA: 03/23/2023  PCP: Dortha Kern, MD  Brief History/Interval Summary: 82 y.o. female with medical history significant for coronary artery disease with occluded RCA, HFrEF status post ICD placement, history of V. tach, current tobacco user, stage IV breast cancer ER positive with liver metastasis, seizure disorder, history of CVA, type 2 diabetes, essential hypertension, hypothyroidism, discharged on 5/9 after being admitted for breakthrough seizures, who presented to [facility] ED with complaints of her AICD firing and having abdominal pain.  Cardiology was consulted.  Patient was hospitalized for further management.  Consultants: Cardiology  Procedures: None    Subjective/Interval History: Patient denies any chest pain this morning.  No shortness of breath.  Abdominal pain seems to be better.  Has not had any bowel movement yet.    Assessment/Plan:  Ventricular tachycardia status post AICD firing Cardiology is following.  To be seen by electrophysiology this morning. Echocardiogram has been ordered.  Monitor electrolytes.  Monitor on telemetry.  Mildly elevated troponin Likely demand ischemia in the setting of above.  Follow-up on echocardiogram.  Chronic combined systolic and diastolic CHF Last echocardiogram in 2017 showed EF to be 40 to 45%.  Grade 2 diastolic dysfunction was noted. Echocardiogram is pending.  No clear evidence for volume overload currently. Patient on furosemide, losartan, metoprolol.  Recent breakthrough seizures Continue with Keppra.  Essential hypertension Continue current medications.  Monitor blood pressures closely.  Hypokalemia Repleted.  Magnesium is 1.9.  Abdominal pain/chronic constipation CT scan did not show any acute findings but did show significant constipation which could be the reason for her abdominal pain. Patient started on bowel regimen.  May need  enemas.  TSH noted to be normal at 1.9.    Hypothyroidism Continue home medication regimen.  Stage IV breast cancer with metastases to liver Follows with Dr. Pamelia Hoit.  Underwent infusion treatment with Karleen Hampshire, dexamethasone on 5/8.  Tobacco abuse Counseled to stop smoking cigarettes.   DVT Prophylaxis: Lovenox Code Status: DNR Family Communication: Discussed with patient Disposition Plan: Lives by herself.  Status is: Observation The patient will require care spanning > 2 midnights and should be moved to inpatient because: AICD discharge/possible ventricular tachycardia      Medications: Scheduled:  Chlorhexidine Gluconate Cloth  6 each Topical Daily   enoxaparin (LOVENOX) injection  40 mg Subcutaneous Q24H   furosemide  20 mg Oral Daily   levETIRAcetam  750 mg Oral BID   liothyronine  25 mcg Oral Daily   losartan  50 mg Oral Daily   metoprolol tartrate  25 mg Oral BID   polyethylene glycol  17 g Oral BID   potassium chloride SA  20 mEq Oral BID   senna-docusate  2 tablet Oral BID   sodium chloride flush  10-40 mL Intracatheter Q12H   Continuous: [id number]:[id number], hydrALAZINE, HYDROmorphone (DILAUDID) injection, lidocaine-prilocaine, melatonin, oxyCODONE, prochlorperazine, sodium chloride flush, sodium phosphate  Antibiotics: Anti-infectives (From admission, onward)    None       Objective:  Vital Signs  Vitals:   03/23/23 1915 03/23/23 2100 03/24/23 0034 03/24/23 0432  BP: (!) 170/81 (!) 181/91 (!) 121/59 138/69  Pulse: 70 73 65 81  Resp: 19  16 16   Temp: 98.6 F (37 C) (!) 97.5 F (36.4 C)  98.2 F (36.8 C)  TempSrc: Oral Oral  Oral  SpO2: 99% 99% 98% 92%  Weight:  63.1 kg  59.6 kg  Height:  No intake or output data in the 24 hours ending 03/24/23 0954 Filed Weights   03/23/23 1230 03/23/23 2100 03/24/23 0432  Weight: 61.2 kg 63.1 kg 59.6 kg    General appearance: Awake alert.  In no distress Resp: Clear to auscultation  bilaterally.  Normal effort Cardio: S1-S2 is normal regular.  No S3-S4.  No rubs murmurs or bruit GI: Abdomen is soft.  Nontender nondistended.  Bowel sounds are present normal.  No masses organomegaly Extremities: No edema.  Full range of motion of lower extremities. Neurologic: Alert and oriented x3.  No focal neurological deficits.    Lab Results:  Data Reviewed: I have personally reviewed following labs and reports of the imaging studies  CBC: Recent Labs  Lab 03/21/23 0954 03/21/23 0954 03/21/23 1615 03/21/23 1621 03/21/23 2224 03/22/23 0727 03/23/23 1241 03/23/23 2121 03/24/23 0446  WBC 4.6   < > 4.4  --  3.0* 5.1 4.7 4.3 3.9*  NEUTROABS 2.3  --  3.2  --   --   --  3.2  --   --   HGB 13.2  --  12.9   < > 13.4 12.9 12.8 12.8 11.6*  HCT 39.9  --  41.7   < > 40.8 37.3 39.1 39.4 35.9*  MCV 90.1  --  94.3  --  88.7 86.3 88.9 90.0 89.8  PLT 225   < > 220  --  203 199 193 197 184   < > = values in this interval not displayed.    Basic Metabolic Panel: Recent Labs  Lab 03/21/23 0954 03/21/23 0954 03/21/23 1615 03/21/23 1621 03/21/23 2224 03/22/23 0727 03/23/23 1241 03/23/23 1923 03/24/23 0446  NA 142  --  138 140  --  139 137  --  138  K 3.5  --  3.5 3.6  --  3.4* 3.2*  --  3.8  CL 108  --  106 106  --  108 105  --  108  CO2 25  --  15*  --   --  21* 22  --  23  GLUCOSE 148*  --  281* 272*  --  148* 142*  --  90  BUN 9  --  12 11  --  8 9  --  10  CREATININE 0.77   < > 1.01* 0.70 0.76 0.70 0.79 0.62 0.85  CALCIUM 8.6*  --  8.8*  --   --  8.6* 9.2  --  8.6*  MG 2.0  --  1.9  --   --  2.0  --   --  1.9  PHOS 3.3  --   --   --   --   --   --   --  3.3   < > = values in this interval not displayed.    GFR: Estimated Creatinine Clearance: 42.4 mL/min (by C-G formula based on SCr of 0.85 mg/dL).  Liver Function Tests: Recent Labs  Lab 03/21/23 0954 03/22/23 0727 03/23/23 1241  AST 16 21 31   ALT 9 14 16   ALKPHOS 93 80 87  BILITOT 0.3 0.5 0.6  PROT 6.9 6.2*  6.5  ALBUMIN 4.0 3.3* 3.5    Recent Labs  Lab 03/23/23 1241  LIPASE 34    HbA1C: Recent Labs    03/21/23 2224  HGBA1C 7.3*    CBG: Recent Labs  Lab 03/21/23 2154 03/22/23 0211 03/22/23 0553 03/22/23 0743 03/22/23 1041  GLUCAP 193* 172* 133* 151* 230*    Lipid Profile:  Recent Labs    03/24/23 0447  CHOL 181  HDL 42  LDLCALC 119*  TRIG 101  CHOLHDL 4.3    Thyroid Function Tests: Recent Labs    03/21/23 2224 03/24/23 0446  TSH 0.193* 1.974  FREET4 0.68  --      Radiology Studies: CT ABDOMEN PELVIS WO CONTRAST  Result Date: 03/23/2023 CLINICAL DATA:  Acute abdominal pain.  Metastatic breast cancer. EXAM: CT ABDOMEN AND PELVIS WITHOUT CONTRAST TECHNIQUE: Multidetector CT imaging of the abdomen and pelvis was performed following the standard protocol without IV contrast. RADIATION DOSE REDUCTION: This exam was performed according to the departmental dose-optimization program which includes automated exposure control, adjustment of the mA and/or kV according to patient size and/or use of iterative reconstruction technique. COMPARISON:  None Available.  01/19/2023 FINDINGS: Lower chest: Unremarkable. Hepatobiliary: 11 x 10 mm hypodensity in the dome of the liver was measured previously at 3.2 x 2.6 cm and characterized as metastases. There is no evidence for gallstones, gallbladder wall thickening, or pericholecystic fluid. No intrahepatic or extrahepatic biliary dilation. Pancreas: No focal mass lesion. No dilatation of the main duct. No intraparenchymal cyst. No peripancreatic edema. Spleen: No splenomegaly. No focal mass lesion. Adrenals/Urinary Tract: Stable bilateral adrenal thickening without discrete nodule or mass. Right kidney unremarkable. Small cyst upper pole left kidney is stable since prior. No followup imaging is recommended. No evidence for hydroureter. The urinary bladder appears normal for the degree of distention. Stomach/Bowel: Tiny hiatal hernia.  Stomach otherwise unremarkable. Duodenum is normally positioned as is the ligament of Treitz. No small bowel wall thickening. No small bowel dilatation. The terminal ileum is normal. The appendix is not well visualized, but there is no edema or inflammation in the region of the cecum. No gross colonic mass. No colonic wall thickening. Prominent stool volume noted right, transverse, and proximal descending colon. Vascular/Lymphatic: There is moderate atherosclerotic calcification of the abdominal aorta. Infrarenal abdominal aorta measures 3.1 cm diameter. There is no gastrohepatic or hepatoduodenal ligament lymphadenopathy. The portal caval lymph node of concern previously is within normal limits for size today. No retroperitoneal or mesenteric lymphadenopathy. No pelvic sidewall lymphadenopathy. Reproductive: Hysterectomy.  There is no adnexal mass. Other: No intraperitoneal free fluid. Musculoskeletal: No worrisome lytic or sclerotic osseous abnormality. IMPRESSION: 1. No acute findings in the abdomen or pelvis. Specifically, no findings to explain the patient's history of abdominal pain. 2. 11 x 10 mm hypodensity in the dome of the liver was measured previously at 3.2 x 2.6 cm and characterized as metastases. 3. Index portacaval node measured previously is unenlarged by CT size criteria today. 4. Prominent stool volume. Imaging features could be compatible with constipation in the appropriate clinical setting. 5. Tiny hiatal hernia. 6. Infrarenal abdominal aortic aneurysm. Recommend follow-up ultrasound every 3 years. This recommendation follows ACR consensus guidelines: White Paper of the ACR Incidental Findings Committee II on Vascular Findings. J Am Coll Radiol 2013; 10:789-794. 7.  Aortic Atherosclerosis (ICD10-I70.0). Electronically Signed   By: Kennith Center M.D.   On: 03/23/2023 14:48   DG Chest Port 1 View  Result Date: 03/23/2023 CLINICAL DATA:  Abdominal distention. History of metastatic breast  cancer. EXAM: PORTABLE CHEST 1 VIEW COMPARISON:  X-ray chest 03/21/2023.  CT scan 01/19/2023 FINDINGS: Enlarged cardiopericardial silhouette with a calcified aorta left upper chest defibrillator with leads overlying the right side of the heart. Right-sided IJ chest port with tip along the SVC right atrial junction region. The port is accessed. No consolidation, pneumothorax or effusion.  No edema. Old rib fractures. Overlapping cardiac leads. Surgical clips in the left axillary region. IMPRESSION: Enlarged heart.  Chest port.  Defibrillator. Electronically Signed   By: Karen Kays M.D.   On: 03/23/2023 12:51       LOS: 0 days   Liberti Appleton Rito Ehrlich  [facility] Pager on [email or web address]  03/24/2023, 9:54 AM

## 2023-03-25 ENCOUNTER — Observation Stay (HOSPITAL_BASED_OUTPATIENT_CLINIC_OR_DEPARTMENT_OTHER): Payer: Medicare HMO

## 2023-03-25 DIAGNOSIS — I472 Ventricular tachycardia, unspecified: Secondary | ICD-10-CM | POA: Diagnosis not present

## 2023-03-25 DIAGNOSIS — Z4502 Encounter for adjustment and management of automatic implantable cardiac defibrillator: Secondary | ICD-10-CM | POA: Diagnosis not present

## 2023-03-25 DIAGNOSIS — I5020 Unspecified systolic (congestive) heart failure: Secondary | ICD-10-CM

## 2023-03-25 LAB — ECHOCARDIOGRAM COMPLETE
AR max vel: 1.57 cm2
AV Area VTI: 1.45 cm2
AV Area mean vel: 1.42 cm2
AV Mean grad: 4.5 mmHg
AV Peak grad: 7.6 mmHg
Ao pk vel: 1.38 m/s
Area-P 1/2: 3.89 cm2
Calc EF: 33.5 %
Height: 62 in
MV M vel: 5.55 m/s
MV Peak grad: 123.2 mmHg
Radius: 0.6 cm
S' Lateral: 4.8 cm
Single Plane A2C EF: 30.3 %
Single Plane A4C EF: 35.2 %
Weight: 2232.82 oz

## 2023-03-25 LAB — CBC
HCT: 36.9 % (ref 36.0–46.0)
Hemoglobin: 11.9 g/dL — ABNORMAL LOW (ref 12.0–15.0)
MCH: 29.5 pg (ref 26.0–34.0)
MCHC: 32.2 g/dL (ref 30.0–36.0)
MCV: 91.3 fL (ref 80.0–100.0)
Platelets: 201 10*3/uL (ref 150–400)
RBC: 4.04 MIL/uL (ref 3.87–5.11)
RDW: 14.7 % (ref 11.5–15.5)
WBC: 5 10*3/uL (ref 4.0–10.5)
nRBC: 0 % (ref 0.0–0.2)

## 2023-03-25 LAB — BASIC METABOLIC PANEL
Anion gap: 8 (ref 5–15)
BUN: 17 mg/dL (ref 8–23)
CO2: 25 mmol/L (ref 22–32)
Calcium: 9.3 mg/dL (ref 8.9–10.3)
Chloride: 103 mmol/L (ref 98–111)
Creatinine, Ser: 0.95 mg/dL (ref 0.44–1.00)
GFR, Estimated: >60 mL/min (ref >60)
Glucose, Bld: 141 mg/dL — ABNORMAL HIGH (ref 70–99)
Potassium: 4.5 mmol/L (ref 3.5–5.1)
Sodium: 136 mmol/L (ref 135–145)

## 2023-03-25 LAB — MAGNESIUM: Magnesium: 2.2 mg/dL (ref 1.7–2.4)

## 2023-03-25 MED ORDER — AMIODARONE HCL 200 MG PO TABS
400.0000 mg | ORAL_TABLET | Freq: Two times a day (BID) | ORAL | Status: DC
  Administered 2023-03-25: 400 mg via ORAL
  Filled 2023-03-25: qty 2

## 2023-03-25 MED ORDER — HEPARIN SOD (PORK) LOCK FLUSH 100 UNIT/ML IV SOLN
500.0000 [IU] | INTRAVENOUS | Status: AC | PRN
  Administered 2023-03-25: 500 [IU]

## 2023-03-25 MED ORDER — AMIODARONE HCL 400 MG PO TABS: 400.0000 mg | ORAL_TABLET | Freq: Two times a day (BID) | ORAL | 0 refills | Status: DC

## 2023-03-25 MED ORDER — METOPROLOL SUCCINATE ER 25 MG PO TB24: 25.0000 mg | ORAL_TABLET | Freq: Every day | ORAL | 1 refills | Status: DC

## 2023-03-25 NOTE — Progress Notes (Signed)
Pt being d/c, VSS, Port was de-accessed, AVS printed, Education complete.   Balinda Quails, RN 03/25/2023 3:23 PM

## 2023-03-25 NOTE — Progress Notes (Addendum)
Electrophysiology Progress Note  Patient Name: Colleen Lowery Date of Encounter: 03/25/2023   Electrophysiologist: Elberta Fortis   Subjective   Patient feels well today.   She is a little overwhelmed with all her recent issues -- she is undergoing chemo, she has had a recent seizure, general malaise, and now shock.  Inpatient Medications    Scheduled Meds:  Chlorhexidine Gluconate Cloth  6 each Topical Daily   enoxaparin (LOVENOX) injection  40 mg Subcutaneous Q24H   furosemide  20 mg Oral Daily   levETIRAcetam  750 mg Oral BID   liothyronine  25 mcg Oral Daily   losartan  50 mg Oral Daily   metoprolol tartrate  25 mg Oral BID   polyethylene glycol  17 g Oral BID   potassium chloride SA  20 mEq Oral BID   senna-docusate  2 tablet Oral BID   sodium chloride flush  10-40 mL Intracatheter Q12H   Continuous Infusions:  PRN Meds: acetaminophen, hydrALAZINE, HYDROmorphone (DILAUDID) injection, lidocaine-prilocaine, melatonin, oxyCODONE, prochlorperazine, sodium chloride flush, sodium phosphate   Vital Signs    Vitals:   03/24/23 2100 03/25/23 0240 03/25/23 0438 03/25/23 1009  BP: (!) 144/76 (!) 147/74    Pulse: 65     Resp:  18  18  Temp:  98.3 F (36.8 C)  98.6 F (37 C)  TempSrc:  Oral  Oral  SpO2: 100% 100%    Weight:   63.3 kg   Height:        Intake/Output Summary (Last 24 hours) at 03/25/2023 1153 Last data filed at 03/25/2023 1000 Gross per 24 hour  Intake 290 ml  Output 0 ml  Net 290 ml   Filed Weights   03/23/23 2100 03/24/23 0432 03/25/23 0438  Weight: 63.1 kg 59.6 kg 63.3 kg    Physical Exam    GEN- The patient is well appearing, alert and oriented x 3 today.   Lungs- Clear to ausculation bilaterally, normal work of breathing Heart- Regular rate and rhythm, no murmurs, rubs or gallops Extremities- no clubbing, cyanosis, or edema Neuro- strength and sensation are intact  Labs      Telemetry    Sinus rhythm with occasional PVCs (personally  reviewed)  Radiology    CT ABDOMEN PELVIS WO CONTRAST  Result Date: 03/23/2023 CLINICAL DATA:  Acute abdominal pain.  Metastatic breast cancer. EXAM: CT ABDOMEN AND PELVIS WITHOUT CONTRAST TECHNIQUE: Multidetector CT imaging of the abdomen and pelvis was performed following the standard protocol without IV contrast. RADIATION DOSE REDUCTION: This exam was performed according to the departmental dose-optimization program which includes automated exposure control, adjustment of the mA and/or kV according to patient size and/or use of iterative reconstruction technique. COMPARISON:  None Available.  01/19/2023 FINDINGS: Lower chest: Unremarkable. Hepatobiliary: 11 x 10 mm hypodensity in the dome of the liver was measured previously at 3.2 x 2.6 cm and characterized as metastases. There is no evidence for gallstones, gallbladder wall thickening, or pericholecystic fluid. No intrahepatic or extrahepatic biliary dilation. Pancreas: No focal mass lesion. No dilatation of the main duct. No intraparenchymal cyst. No peripancreatic edema. Spleen: No splenomegaly. No focal mass lesion. Adrenals/Urinary Tract: Stable bilateral adrenal thickening without discrete nodule or mass. Right kidney unremarkable. Small cyst upper pole left kidney is stable since prior. No followup imaging is recommended. No evidence for hydroureter. The urinary bladder appears normal for the degree of distention. Stomach/Bowel: Tiny hiatal hernia. Stomach otherwise unremarkable. Duodenum is normally positioned as is the ligament of Treitz.  No small bowel wall thickening. No small bowel dilatation. The terminal ileum is normal. The appendix is not well visualized, but there is no edema or inflammation in the region of the cecum. No gross colonic mass. No colonic wall thickening. Prominent stool volume noted right, transverse, and proximal descending colon. Vascular/Lymphatic: There is moderate atherosclerotic calcification of the abdominal aorta.  Infrarenal abdominal aorta measures 3.1 cm diameter. There is no gastrohepatic or hepatoduodenal ligament lymphadenopathy. The portal caval lymph node of concern previously is within normal limits for size today. No retroperitoneal or mesenteric lymphadenopathy. No pelvic sidewall lymphadenopathy. Reproductive: Hysterectomy.  There is no adnexal mass. Other: No intraperitoneal free fluid. Musculoskeletal: No worrisome lytic or sclerotic osseous abnormality. IMPRESSION: 1. No acute findings in the abdomen or pelvis. Specifically, no findings to explain the patient's history of abdominal pain. 2. 11 x 10 mm hypodensity in the dome of the liver was measured previously at 3.2 x 2.6 cm and characterized as metastases. 3. Index portacaval node measured previously is unenlarged by CT size criteria today. 4. Prominent stool volume. Imaging features could be compatible with constipation in the appropriate clinical setting. 5. Tiny hiatal hernia. 6. Infrarenal abdominal aortic aneurysm. Recommend follow-up ultrasound every 3 years. This recommendation follows ACR consensus guidelines: White Paper of the ACR Incidental Findings Committee II on Vascular Findings. J Am Coll Radiol 2013; 10:789-794. 7.  Aortic Atherosclerosis (ICD10-I70.0). Electronically Signed   By: Kennith Center M.D.   On: 03/23/2023 14:48   DG Chest Port 1 View  Result Date: 03/23/2023 CLINICAL DATA:  Abdominal distention. History of metastatic breast cancer. EXAM: PORTABLE CHEST 1 VIEW COMPARISON:  X-ray chest 03/21/2023.  CT scan 01/19/2023 FINDINGS: Enlarged cardiopericardial silhouette with a calcified aorta left upper chest defibrillator with leads overlying the right side of the heart. Right-sided IJ chest port with tip along the SVC right atrial junction region. The port is accessed. No consolidation, pneumothorax or effusion. No edema. Old rib fractures. Overlapping cardiac leads. Surgical clips in the left axillary region. IMPRESSION: Enlarged  heart.  Chest port.  Defibrillator. Electronically Signed   By: Karen Kays M.D.   On: 03/23/2023 12:51     Patient Profile     Colleen Lowery is a 82 y.o. 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    Assessment & Plan     VT - reported. I have not yet seen confirmation on device interrogation Treated with sotalol in the past -- this was discontinued while under care at [facility]. Notes indicate that she continued to have NSVT on the sotalol. One device note from 07/2022 reports an alert with a sustained tachycardia at 176 bpm. Sotalol was off her medication list at the next encounter, for her right-side device extraction. She has not seen her [facility] EP since 2017 I have requested device interrogation to review Start amiodarone PO -- she feels well, is euvolemic, and has not had any recurrence of arrhythmia. I think she can begin amiodarone as an outpatient.   Medtronic single chamber Icd in place Device interrogation pending Per [employer] notes in  [place], she has had a left-sided ICD that was extracted in 2021 and replaced with a right-sided ICD -- possibly to accommodate radiation therapy for breast cancer. This was also extracted (due to pocket  pain) and her current ICD was implanted on the left.  Metastatic breast cancer Undergoing chemotherapy Has liver mets  HFrEF EF 40-45% in 2017 Repeat TTE pending  Hypertension BP uncontrolled  Stage IV breast cancer Liver metastases  Seizure disorder Longstanding history of seizure, recent admission Per primary  hypokalemia Keep K+ > 4  She would like to transfer care to [facility]. We will make arrangements for cardiology and EP follow-up.  York Pellant MD 03/25/2023 11:53 AM    For questions or updates,  please contact [facility] Please consult [email or web address] for contact info under Cardiology/STEMI.  Signed, Maurice Small, MD  03/25/2023, 11:53 AM

## 2023-03-25 NOTE — Discharge Summary (Signed)
[facility]  Physician Discharge Summary   Patient ID: VANDY REUTHER MRN: [id number] DOB/AGE: 80/24/1942 80 y.o.  Admit date: 03/23/2023 Discharge date: 03/25/2023    PCP: Dortha Kern, MD  DISCHARGE DIAGNOSES:    AICD discharge   HFrEF (heart failure with reduced ejection fraction) (HCC)   Ventricular tachycardia (HCC)   RECOMMENDATIONS FOR OUTPATIENT FOLLOW UP: Cardiology to arrange outpatient follow-up    Home Health: None Equipment/Devices: None  CODE STATUS: Full code  DISCHARGE CONDITION: fair  Diet recommendation: As before  INITIAL HISTORY: 82 y.o. female with medical history significant for coronary artery disease with occluded RCA, HFrEF status post ICD placement, history of V. 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  Cardiology was consulted.  Patient was hospitalized for further management.   Consultants: Cardiology   Procedures: None   HOSPITAL COURSE:   Ventricular tachycardia status post AICD firing No further episodes noted on telemetry.  Patient seen electrophysiology.  Started on amiodarone.  Has been stable for the past 48 hours.  Feels well.  Cleared for discharge.  Cardiology will arrange outpatient follow-up.     Mildly elevated troponin Likely demand ischemia in the setting of above.     Chronic combined systolic and diastolic CHF Last echocardiogram in 2017 showed EF to be 40 to 45%.  Grade 2 diastolic dysfunction was noted. Echocardiogram was repeated and shows EF to be 35 to 40%.  Further workup to be considered in the outpatient setting.  No clear evidence of volume overload currently.  Continue with home medications.     Recent breakthrough  seizures Continue with Keppra.   Essential hypertension Continue current medications.     Hypokalemia Repleted.  Magnesium is 1.9.   Abdominal pain/chronic constipation CT scan did not show any acute findings but did show significant constipation which could be the reason for her abdominal pain. Patient started on bowel regimen.  She did have multiple bowel movements.   TSH noted to be normal at 1.9.     Hypothyroidism Continue home medication regimen.   Stage IV breast cancer with metastases to liver Follows with Dr. Pamelia Hoit.  Underwent infusion treatment with Karleen Hampshire, dexamethasone on 5/8.   Tobacco abuse Counseled to stop smoking cigarettes.   Patient feels well.  Cleared by cardiology for discharge.  Okay for discharge home today.   PERTINENT LABS:  The results of significant diagnostics from this hospitalization (including imaging, microbiology, ancillary and laboratory) are listed below for reference.    Labs:   Basic Metabolic Panel: Recent Labs  Lab 03/21/23 0954 03/21/23 0954 03/21/23 1615 03/21/23 1621 03/21/23 2224 03/22/23 0727 03/23/23 1241 03/23/23 1923 03/24/23 0446 03/25/23 0240  NA 142  --  138 140  --  139 137  --  138 136  K 3.5  --  3.5 3.6  --  3.4* 3.2*  --  3.8 4.5  CL 108  --  106 106  --  108 105  --  108 103  CO2 25  --  15*  --   --  21* 22  --  23 25  GLUCOSE 148*  --  281* 272*  --  148* 142*  --  90 141*  BUN 9  --  12 11  --  8 9  --  10 17  CREATININE 0.77   < > 1.01* 0.70   < > 0.70 0.79 0.62 0.85 0.95  CALCIUM 8.6*  --  8.8*  --   --  8.6* 9.2  --  8.6* 9.3  MG 2.0  --  1.9  --   --  2.0  --   --  1.9 2.2  PHOS 3.3  --   --   --   --   --   --   --  3.3  --    < > = values in this interval not displayed.   Liver Function Tests: Recent Labs  Lab 03/21/23 0954 03/22/23 0727 03/23/23 1241  AST 16 21 31   ALT 9 14 16   ALKPHOS 93 80 87  BILITOT 0.3 0.5 0.6  PROT 6.9 6.2* 6.5  ALBUMIN 4.0 3.3* 3.5   Recent Labs   Lab 03/23/23 1241  LIPASE 34   CBC: Recent Labs  Lab 03/21/23 0954 03/21/23 1615 03/21/23 1621 03/22/23 0727 03/23/23 1241 03/23/23 2121 03/24/23 0446 03/25/23 0240  WBC 4.6 4.4   < > 5.1 4.7 4.3 3.9* 5.0  NEUTROABS 2.3 3.2  --   --  3.2  --   --   --   HGB 13.2 12.9   < > 12.9 12.8 12.8 11.6* 11.9*  HCT 39.9 41.7   < > 37.3 39.1 39.4 35.9* 36.9  MCV 90.1 94.3   < > 86.3 88.9 90.0 89.8 91.3  PLT 225 220   < > 199 193 197 184 201   < > = values in this interval not displayed.     CBG: Recent Labs  Lab 03/21/23 2154 03/22/23 0211 03/22/23 0553 03/22/23 0743 03/22/23 1041  GLUCAP 193* 172* 133* 151* 230*     IMAGING STUDIES ECHOCARDIOGRAM COMPLETE  Result Date: 03/25/2023    ECHOCARDIOGRAM REPORT   Patient Name:   SHAKEILA MARCHITTO Date of Exam: 03/25/2023 Medical Rec #:  [id number]    Height:       62.0 in Accession #:    [id number]   Weight:       139.6 lb Date of Birth:  03-09-1941     BSA:          1.641 m Patient Age:    81 years     BP:           147/74 mmHg Patient Gender: F            HR:           63 bpm. Exam Location:  Inpatient Procedure: 2D Echo, Cardiac Doppler and Color Doppler Indications:    CHF  History:        Patient has prior history of Echocardiogram examinations, most                 recent 02/08/2016. CHF, CAD, Defibrillator, PAD,                 Arrythmias:Vtach; Risk Factors:Hypertension. Breast Cancer s/p                 mastectomy.  Sonographer:    Milbert Coulter Referring Phys: [id number] TIFFANY [place]  Sonographer Comments: Image acquisition challenging due to mastectomy. IMPRESSIONS  1. Endocardial borders are challenging. Left ventricular ejection fraction, by estimation, is 35 to 40%. The left ventricle has moderately decreased function. The left ventricle demonstrates global hypokinesis. Left ventricular diastolic parameters are indeterminate.  2. Right ventricular  systolic function is mildly reduced. The right ventricular size is normal. There is mildly  elevated pulmonary artery systolic pressure. The estimated right ventricular systolic pressure is 37.1 mmHg.  3. Left atrial size was severely dilated.  4. The mitral valve was not well visualized. Mild mitral valve regurgitation.  5. Aortic valve regurgitation is not visualized. Aortic valve sclerosis/calcification is present, without any evidence of aortic stenosis.  6. The inferior vena cava is normal in size with greater than 50% respiratory variability, suggesting right atrial pressure of 3 mmHg. FINDINGS  Left Ventricle: Endocardial borders are challenging. Left ventricular ejection fraction, by estimation, is 35 to 40%. The left ventricle has moderately decreased function. The left ventricle demonstrates global hypokinesis. The left ventricular internal  cavity size was normal in size. There is no left ventricular hypertrophy. Left ventricular diastolic parameters are indeterminate. Right Ventricle: The right ventricular size is normal. Right ventricular systolic function is mildly reduced. There is mildly elevated pulmonary artery systolic pressure. The tricuspid regurgitant velocity is 2.92 m/s, and with an assumed right atrial pressure of 3 mmHg, the estimated right ventricular systolic pressure is 37.1 mmHg. Left Atrium: Left atrial size was severely dilated. Right Atrium: Right atrial size was normal in size. Pericardium: There is no evidence of pericardial effusion. Mitral Valve: The mitral valve was not well visualized. Mild mitral valve regurgitation. Tricuspid Valve: Tricuspid valve regurgitation is mild. Aortic Valve: Aortic valve regurgitation is not visualized. Aortic valve sclerosis/calcification is present, without any evidence of aortic stenosis. Aortic valve mean gradient measures 4.5 mmHg. Aortic valve peak gradient measures 7.6 mmHg. Aortic valve  area, by VTI measures 1.45 cm. Pulmonic Valve: Pulmonic valve regurgitation is not visualized. Aorta: The aortic root and ascending aorta are  structurally normal, with no evidence of dilitation. Venous: The inferior vena cava is normal in size with greater than 50% respiratory variability, suggesting right atrial pressure of 3 mmHg. IAS/Shunts: No atrial level shunt detected by color flow Doppler. Additional Comments: A device lead is visualized.  LEFT VENTRICLE PLAX 2D LVIDd:         5.95 cm     Diastology LVIDs:         4.80 cm     LV e' medial:    5.11 cm/s LV PW:         1.05 cm     LV E/e' medial:  20.0 LV IVS:        1.05 cm     LV e' lateral:   10.40 cm/s LVOT diam:     1.80 cm     LV E/e' lateral: 9.8 LV SV:         39 LV SV Index:   24 LVOT Area:     2.54 cm  LV Volumes (MOD) LV vol d, MOD A2C: 79.6 ml LV vol d, MOD A4C: 69.1 ml LV vol s, MOD A2C: 55.5 ml LV vol s, MOD A4C: 44.8 ml LV SV MOD A2C:     24.1 ml LV SV MOD A4C:     69.1 ml LV SV MOD BP:      25.7 ml RIGHT VENTRICLE RV Basal diam:  2.75 cm RV Mid diam:    2.20 cm RV S prime:     9.25 cm/s TAPSE (M-mode): 1.3 cm LEFT ATRIUM             Index        RIGHT ATRIUM           Index  LA diam:        3.80 cm 2.32 cm/m   RA Area:     11.40 cm LA Vol (A2C):   71.2 ml 43.40 ml/m  RA Volume:   25.00 ml  15.24 ml/m LA Vol (A4C):   89.9 ml 54.80 ml/m LA Biplane Vol: 79.7 ml 48.58 ml/m  AORTIC VALVE AV Area (Vmax):    1.57 cm AV Area (Vmean):   1.42 cm AV Area (VTI):     1.45 cm AV Vmax:           138.00 cm/s AV Vmean:          95.550 cm/s AV VTI:            0.272 m AV Peak Grad:      7.6 mmHg AV Mean Grad:      4.5 mmHg LVOT Vmax:         84.90 cm/s LVOT Vmean:        53.500 cm/s LVOT VTI:          0.155 m LVOT/AV VTI ratio: 0.57  AORTA Ao Root diam: 1.95 cm Ao Asc diam:  2.60 cm MITRAL VALVE                  TRICUSPID VALVE MV Area (PHT): 3.89 cm       TR Peak grad:   34.1 mmHg MV Decel Time: 195 msec       TR Vmax:        292.00 cm/s MR Peak grad:    123.2 mmHg MR Mean grad:    81.0 mmHg    SHUNTS MR Vmax:         555.00 cm/s  Systemic VTI:  0.16 m MR Vmean:        429.0 cm/s   Systemic  Diam: 1.80 cm MR PISA:         2.26 cm MR PISA Eff ROA: 13 mm MR PISA Radius:  0.60 cm MV E velocity: 102.00 cm/s MV A velocity: 81.80 cm/s MV E/A ratio:  1.25 Halford Decamp signed by Carolan Clines Signature Date/Time: 03/25/2023/12:26:19 PM    Final    CT ABDOMEN PELVIS WO CONTRAST  Result Date: 03/23/2023 CLINICAL DATA:  Acute abdominal pain.  Metastatic breast cancer. EXAM: CT ABDOMEN AND PELVIS WITHOUT CONTRAST TECHNIQUE: Multidetector CT imaging of the abdomen and pelvis was performed following the standard protocol without IV contrast. RADIATION DOSE REDUCTION: This exam was performed according to the departmental dose-optimization program which includes automated exposure control, adjustment of the mA and/or kV according to patient size and/or use of iterative reconstruction technique. COMPARISON:  None Available.  01/19/2023 FINDINGS: Lower chest: Unremarkable. Hepatobiliary: 11 x 10 mm hypodensity in the dome of the liver was measured previously at 3.2 x 2.6 cm and characterized as metastases. There is no evidence for gallstones, gallbladder wall thickening, or pericholecystic fluid. No intrahepatic or extrahepatic biliary dilation. Pancreas: No focal mass lesion. No dilatation of the main duct. No intraparenchymal cyst. No peripancreatic edema. Spleen: No splenomegaly. No focal mass lesion. Adrenals/Urinary Tract: Stable bilateral adrenal thickening without discrete nodule or mass. Right kidney unremarkable. Small cyst upper pole left kidney is stable since prior. No followup imaging is recommended. No evidence for hydroureter. The urinary bladder appears normal for the degree of distention. Stomach/Bowel: Tiny hiatal hernia. Stomach otherwise unremarkable. Duodenum is normally positioned as is the ligament of Treitz. No small bowel wall thickening. No small bowel dilatation. The terminal ileum  is normal. The appendix is not well visualized, but there is no edema or inflammation in the  region of the cecum. No gross colonic mass. No colonic wall thickening. Prominent stool volume noted right, transverse, and proximal descending colon. Vascular/Lymphatic: There is moderate atherosclerotic calcification of the abdominal aorta. Infrarenal abdominal aorta measures 3.1 cm diameter. There is no gastrohepatic or hepatoduodenal ligament lymphadenopathy. The portal caval lymph node of concern previously is within normal limits for size today. No retroperitoneal or mesenteric lymphadenopathy. No pelvic sidewall lymphadenopathy. Reproductive: Hysterectomy.  There is no adnexal mass. Other: No intraperitoneal free fluid. Musculoskeletal: No worrisome lytic or sclerotic osseous abnormality. IMPRESSION: 1. No acute findings in the abdomen or pelvis. Specifically, no findings to explain the patient's history of abdominal pain. 2. 11 x 10 mm hypodensity in the dome of the liver was measured previously at 3.2 x 2.6 cm and characterized as metastases. 3. Index portacaval node measured previously is unenlarged by CT size criteria today. 4. Prominent stool volume. Imaging features could be compatible with constipation in the appropriate clinical setting. 5. Tiny hiatal hernia. 6. Infrarenal abdominal aortic aneurysm. Recommend follow-up ultrasound every 3 years. This recommendation follows ACR consensus guidelines: White Paper of the ACR Incidental Findings Committee II on Vascular Findings. J Am Coll Radiol 2013; 10:789-794. 7.  Aortic Atherosclerosis (ICD10-I70.0). Electronically Signed   By: Kennith Center M.D.   On: 03/23/2023 14:48   DG Chest Port 1 View  Result Date: 03/23/2023 CLINICAL DATA:  Abdominal distention. History of metastatic breast cancer. EXAM: PORTABLE CHEST 1 VIEW COMPARISON:  X-ray chest 03/21/2023.  CT scan 01/19/2023 FINDINGS: Enlarged cardiopericardial silhouette with a calcified aorta left upper chest defibrillator with leads overlying the right side of the heart. Right-sided IJ chest port  with tip along the SVC right atrial junction region. The port is accessed. No consolidation, pneumothorax or effusion. No edema. Old rib fractures. Overlapping cardiac leads. Surgical clips in the left axillary region. IMPRESSION: Enlarged heart.  Chest port.  Defibrillator. Electronically Signed   By: Karen Kays M.D.   On: 03/23/2023 12:51   EEG adult now  Result Date: 03/22/2023 Charlsie Quest, MD     03/22/2023  8:35 AM Patient Name: Colleen Lowery MRN: [id number] Epilepsy Attending: Charlsie Quest Referring Physician/Provider: [place], Eric J, DO Date: 03/22/2023 Duration: 25.49 mins Patient history: 82 y.o. female with a pertinent medical history of AICD, left breast cancer, liver mets, Crohns, diabetes, heart failure, seizure , and thyroid disease who was brought to the [facility] ED after she was found seizing in the cancer center parking lot. EEG to evaluate for seizure Level of alertness: Awake AEDs during EEG study: LEV Technical aspects: This EEG study was done with scalp electrodes positioned according to the 10-20 International system of electrode placement. Electrical activity was reviewed with band pass filter of 1-70Hz , sensitivity of 7 uV/mm, display speed of 8mm/sec with a 60Hz  notched filter applied as appropriate. EEG data were recorded continuously and digitally stored.  Video monitoring was available and reviewed as appropriate. Description: The posterior dominant rhythm consists of 9 Hz activity of moderate voltage (25-35 uV) seen predominantly in posterior head regions, symmetric and reactive to eye opening and eye closing. Hyperventilation and photic stimulation were not performed.   IMPRESSION: This study is within normal limits. No seizures or epileptiform discharges were seen throughout the recording. A normal interictal EEG does not exclude the diagnosis of epilepsy. Priyanka Annabelle Harman   DG CHEST PORT 1 VIEW  Result Date: 03/21/2023 CLINICAL DATA:  Shortness of breath EXAM: PORTABLE CHEST 1  VIEW COMPARISON:  12/11/2022 FINDINGS: Right-sided central venous port tip over the cavoatrial region. Left-sided single lead pacing device as before. Borderline cardiomegaly with aortic atherosclerosis. No acute airspace disease, pleural effusion, or pneumothorax. IMPRESSION: No active disease. Borderline cardiomegaly. Electronically Signed   By: Jasmine Pang M.D.   On: 03/21/2023 21:59   CT Head Wo Contrast  Result Date: 03/21/2023 CLINICAL DATA:  Head trauma, minor. EXAM: CT HEAD WITHOUT CONTRAST TECHNIQUE: Contiguous axial images were obtained from the base of the skull through the vertex without intravenous contrast. RADIATION DOSE REDUCTION: This exam was performed according to the departmental dose-optimization program which includes automated exposure control, adjustment of the mA and/or kV according to patient size and/or use of iterative reconstruction technique. COMPARISON:  Head CT 12/11/2022. FINDINGS: Brain: No acute intracranial hemorrhage. Old lacunar infarct in the left thalamus is unchanged. Gray-white differentiation is otherwise preserved. No hydrocephalus or extra-axial collection. No mass effect or midline shift. Vascular: No hyperdense vessel or unexpected calcification. Skull: No calvarial fracture or suspicious bone lesion. Skull base is unremarkable. Sinuses/Orbits: Unremarkable. Other: None. IMPRESSION: 1. No evidence of acute intracranial injury. 2. Old left thalamic lacunar infarct. Electronically Signed   By: Orvan Falconer M.D.   On: 03/21/2023 16:56    DISCHARGE EXAMINATION: See progress note from earlier today  DISPOSITION: Home  Discharge Instructions     (HEART FAILURE PATIENTS) Call MD:  Anytime you have any of the following symptoms: 1) 3 pound weight gain in 24 hours or 5 pounds in 1 week 2) shortness of breath, with or without a dry hacking cough 3) swelling in the hands, feet or stomach 4) if you have to sleep on extra pillows at night in order to breathe.    Complete by: As directed    Call MD for:  difficulty breathing, headache or visual disturbances   Complete by: As directed    Call MD for:  extreme fatigue   Complete by: As directed    Call MD for:  persistant dizziness or light-headedness   Complete by: As directed    Call MD for:  persistant nausea and vomiting   Complete by: As directed    Call MD for:  severe uncontrolled pain   Complete by: As directed    Call MD for:  temperature >100.4   Complete by: As directed    Diet - low sodium heart healthy   Complete by: As directed    Discharge instructions   Complete by: As directed    Please be sure to call and make an appointment with your cardiologist within the next 2 weeks.  You were cared for by a hospitalist during your hospital stay. If you have any questions about your discharge medications or the care you received while you were in the hospital after you are discharged, you can call the unit and asked to speak with the hospitalist on call if the hospitalist that took care of you is not available. Once you are discharged, your primary care physician will handle any further medical issues. Please note that NO REFILLS for any discharge medications will be authorized once you are discharged, as it is imperative that you return to your primary care physician (or establish a relationship with a primary care physician if you do not have one) for your aftercare needs so that they can reassess your need for medications and monitor your lab values. If  you do not have a primary care physician, you can call [phone] for a physician referral.   Increase activity slowly   Complete by: As directed           Allergies as of 03/25/2023       Reactions   Alcohol Anaphylaxis, Swelling   Purell hand sanitizer Swelling in the luNGS, CHEST AND THROAT and seizures Problem with smell not the Alcohol Other reaction(s): Other (See Comments) Causes seizures and swelling   Contrast Media  [iodinated Contrast Media] Anaphylaxis   Throat closed, was 40 yrs ago *Iodine    Iodine Anaphylaxis   Throat closed, was 40 yrs ago   Atorvastatin    Myalgia    Dust Mite Extract Swelling   SWELLING IS GI SYSTEM   Latex    QUESTIONABLE INTOLERABLE MUCOUS OOZING OUT OF THROAT   Lisinopril Other (See Comments)   Pt does not remember reaction    Molds & Smuts Swelling   SWELLING IS IN GI SYSTEM   Wound Dressing Adhesive Other (See Comments)   Causes skin to tear        Medication List     STOP taking these medications    CALCIUM PO   loperamide 2 MG capsule Commonly known as: IMODIUM       TAKE these medications    alendronate 70 MG tablet Commonly known as: FOSAMAX Take 1 tablet (70 mg total) by mouth once a week. Take with a full glass of water on an empty stomach.   amiodarone 400 MG tablet Commonly known as: PACERONE Take 1 tablet (400 mg total) by mouth 2 (two) times daily for 14 days.   ARNICARE PAIN RELIEF EX Apply 1 application  topically daily as needed (pain).   BLOOD SUGAR BALANCE PO Take 3 capsules by mouth daily. Blood Sugar Manager   dicyclomine 20 MG tablet Commonly known as: BENTYL Take 1 tablet (20 mg total) by mouth every 6 (six) hours as needed for spasms (stomach cramps).   Flaxseed Oil 1000 MG Caps Take 3,000 mg by mouth daily. Taking as needed   furosemide 20 MG tablet Commonly known as: LASIX Take 20 mg by mouth daily.   levETIRAcetam 750 MG tablet Commonly known as: Keppra Take 1 tablet (750 mg total) by mouth 2 (two) times daily.   lidocaine-prilocaine cream Commonly known as: EMLA Apply to affected area once What changed:  how much to take how to take this when to take this reasons to take this additional instructions   liothyronine 25 MCG tablet Commonly known as: CYTOMEL Take 25 mcg by mouth daily.   losartan 50 MG tablet Commonly known as: COZAAR Take 50 mg by mouth daily.   MAGNESIUM PO Take 5 mLs by mouth  daily. Liquid 2.5 ml/150 mg   metoprolol succinate 25 MG 24 hr tablet Commonly known as: Toprol XL Take 1 tablet (25 mg total) by mouth daily.   ondansetron 8 MG tablet Commonly known as: Zofran Take 1 tablet (8 mg total) by mouth every 8 (eight) hours as needed for nausea or vomiting. Take 1 tablets (8mg  total) by mouth every 8hrs as needed. Start on the third day after chemotherapy.   OVER THE COUNTER MEDICATION Take 1 Scoop by mouth daily. Medication: Branched chain amino acids 4000  Patient states "taking for muscles"   OVER THE COUNTER MEDICATION Take 400-800 mg by mouth daily as needed (Based on Blood glucose). Medication: gymnema sylvestre Patient takes for "lowering blood glucose"  OVER THE COUNTER MEDICATION Take 5 capsules by mouth daily. Medication: Pau d' arco 500 mg each  Patient states now taking liquid and "keeps mold down"   OVER THE COUNTER MEDICATION Take 6 capsules by mouth daily. Medication: Caprylic acid Patient taking to "lower candida risk"   OVER THE COUNTER MEDICATION Take 5 capsules by mouth daily. Medication: Pancreatic enzymes Patient taking "to get fluid out"   OVER THE COUNTER MEDICATION Take 3 capsules by mouth daily. Raw Adrenal Patient taking for "overall energy"   OVER THE COUNTER MEDICATION Take 6 capsules by mouth daily. Beef Pancreas Patient taking for "pancreatic function improvement and lymphatic system improvement"   PANTETHINE PO Take 1-2 tablets by mouth daily as needed (Itching). Medication: Vitamin B5   polyethylene glycol powder 17 GM/SCOOP powder Commonly known as: GLYCOLAX/MIRALAX Take 17 g by mouth 2 (two) times daily.   potassium chloride SA 20 MEQ tablet Commonly known as: KLOR-CON M Take 20 mEq by mouth 2 (two) times daily.   prochlorperazine 10 MG tablet Commonly known as: COMPAZINE Take 1 tablet (10 mg total) by mouth every 6 (six) hours as needed for nausea or vomiting.   Prolensa 0.07 % Soln Generic drug:  Bromfenac Sodium Place 1 drop into the right eye 4 (four) times daily.   Systane Balance 0.6 % Soln Generic drug: Propylene Glycol Place 2 drops into the right eye in the morning and at bedtime.   True Metrix Blood Glucose Test test strip Generic drug: glucose blood SMARTSIG:Via Meter   TRUEplus Lancets 33G Misc   vitamin A 40981 UNIT capsule Take 50,000 Units by mouth daily.   Vitamin B Complex Tabs Take 2 tablets by mouth daily.   VITAMIN C PO Take 2,000 mg by mouth daily.   Vitamin D3 125 MCG (5000 UT) Caps Take 5,000 Units by mouth daily. 2500 units per cap          Follow-up Information     Dortha Kern, MD Follow up in 1 week(s).   Specialty: Family Medicine Why: post hospitalization follow up Contact information: [address] [place] [address] [phone]         [facility] Follow up.   Specialty: Cardiology Why: [facility] office will call you for a 2 week follow-up appointment with general cardiology as well as electrophysiology. Please be sure to clarify the appointment location when they call as we have several different locations. Contact information: [address], [address] [id number] mc [place] [address] [phone]                TOTAL DISCHARGE TIME: 35 minutes  Reilyn Nelson Rito Ehrlich  [facility] Pager on [email or web address]  03/26/2023, 10:24 AM

## 2023-03-25 NOTE — Progress Notes (Signed)
[facility] PROGRESS NOTE   Colleen Lowery ZOX:[id number] DOB: July 08, 1941 DOA: 03/23/2023  PCP: Dortha Kern, MD  Brief History/Interval Summary: 82 y.o. female with medical history significant for coronary artery disease with occluded RCA, HFrEF status post ICD placement, history of V. 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  Cardiology was consulted.  Patient was hospitalized for further management.  Consultants: Cardiology  Procedures: None    Subjective/Interval History: Patient feels well.  No chest pain or shortness of breath.  Hoping to go home soon.      Assessment/Plan:  Ventricular tachycardia status post AICD firing No further episodes noted on telemetry.  Electrophysiology is following.  Echocardiogram is pending.  Device interrogation is pending.  Monitor electrolytes.  Mildly elevated troponin Likely demand ischemia in the setting of above.  Follow-up on echocardiogram.  Chronic combined systolic and diastolic CHF Last echocardiogram in 2017 showed EF to be 40 to 45%.  Grade 2 diastolic dysfunction was noted. Echocardiogram is pending.  No clear evidence for volume overload currently. Patient on furosemide, losartan, metoprolol.  Recent breakthrough seizures Continue with Keppra.  Essential hypertension Continue current medications.  Monitor blood pressures closely.  Hypokalemia Repleted.  Magnesium is 1.9.  Abdominal pain/chronic constipation CT scan did not show any acute findings but did show significant constipation which could be the reason for her abdominal pain. Patient started on bowel regimen.  She did have multiple bowel movements.   TSH noted to be normal  at 1.9.    Hypothyroidism Continue home medication regimen.  Stage IV breast cancer with metastases to liver Follows with Dr. Pamelia Hoit.  Underwent infusion treatment with Karleen Hampshire, dexamethasone on 5/8.  Tobacco abuse Counseled to stop smoking cigarettes.   DVT Prophylaxis: Lovenox Code Status: DNR Family Communication: Discussed with patient Disposition Plan: Lives by herself.  Status is: Observation The patient will require care spanning > 2 midnights and should be moved to inpatient because: AICD discharge/possible ventricular tachycardia      Medications: Scheduled:  Chlorhexidine Gluconate Cloth  6 each Topical Daily   enoxaparin (LOVENOX) injection  40 mg Subcutaneous Q24H   furosemide  20 mg Oral Daily   levETIRAcetam  750 mg Oral BID   liothyronine  25 mcg Oral Daily   losartan  50 mg Oral Daily   metoprolol tartrate  25 mg Oral BID   polyethylene glycol  17 g Oral BID   potassium chloride SA  20 mEq Oral BID   senna-docusate  2 tablet Oral BID   sodium chloride flush  10-40 mL Intracatheter Q12H   Continuous: WJX:BJYNWGNFAOZHY, hydrALAZINE, HYDROmorphone (DILAUDID) injection, lidocaine-prilocaine, melatonin, oxyCODONE, prochlorperazine, sodium chloride flush, sodium phosphate  Antibiotics: Anti-infectives (From admission, onward)    None       Objective:  Vital Signs  Vitals:   03/24/23 2100 03/25/23 0240 03/25/23 0438 03/25/23 1009  BP: (!) 144/76 (!) 147/74    Pulse: 65     Resp:  18  18  Temp:  98.3 F (36.8 C)  98.6 F (37 C)  TempSrc:  Oral  Oral  SpO2: 100% 100%    Weight:   63.3 kg   Height:        Intake/Output Summary (Last 24 hours) at 03/25/2023  1035 Last data filed at 03/25/2023 1000 Gross per 24 hour  Intake 290 ml  Output 0 ml  Net 290 ml   Filed Weights   03/23/23 2100 03/24/23 0432 03/25/23 0438  Weight: 63.1 kg 59.6 kg 63.3 kg    General appearance: Awake alert.  In no distress Resp: Clear to auscultation  bilaterally.  Normal effort Cardio: S1-S2 is normal regular.  No S3-S4.  No rubs murmurs or bruit GI: Abdomen is soft.  Nontender nondistended.  Bowel sounds are present normal.  No masses organomegaly Extremities: No edema.  Full range of motion of lower extremities. Neurologic: Alert and oriented x3.  No focal neurological deficits.     Lab Results:  Data Reviewed: I have personally reviewed following labs and reports of the imaging studies  CBC: Recent Labs  Lab 03/21/23 0954 03/21/23 1615 03/21/23 1621 03/22/23 0727 03/23/23 1241 03/23/23 2121 03/24/23 0446 03/25/23 0240  WBC 4.6 4.4   < > 5.1 4.7 4.3 3.9* 5.0  NEUTROABS 2.3 3.2  --   --  3.2  --   --   --   HGB 13.2 12.9   < > 12.9 12.8 12.8 11.6* 11.9*  HCT 39.9 41.7   < > 37.3 39.1 39.4 35.9* 36.9  MCV 90.1 94.3   < > 86.3 88.9 90.0 89.8 91.3  PLT 225 220   < > 199 193 197 184 201   < > = values in this interval not displayed.     Basic Metabolic Panel: Recent Labs  Lab 03/21/23 0954 03/21/23 0954 03/21/23 1615 03/21/23 1621 03/21/23 2224 03/22/23 0727 03/23/23 1241 03/23/23 1923 03/24/23 0446 03/25/23 0240  NA 142  --  138 140  --  139 137  --  138 136  K 3.5  --  3.5 3.6  --  3.4* 3.2*  --  3.8 4.5  CL 108  --  106 106  --  108 105  --  108 103  CO2 25  --  15*  --   --  21* 22  --  23 25  GLUCOSE 148*  --  281* 272*  --  148* 142*  --  90 141*  BUN 9  --  12 11  --  8 9  --  10 17  CREATININE 0.77   < > 1.01* 0.70   < > 0.70 0.79 0.62 0.85 0.95  CALCIUM 8.6*  --  8.8*  --   --  8.6* 9.2  --  8.6* 9.3  MG 2.0  --  1.9  --   --  2.0  --   --  1.9 2.2  PHOS 3.3  --   --   --   --   --   --   --  3.3  --    < > = values in this interval not displayed.     GFR: Estimated Creatinine Clearance: 42 mL/min (by C-G formula based on SCr of 0.95 mg/dL).  Liver Function Tests: Recent Labs  Lab 03/21/23 0954 03/22/23 0727 03/23/23 1241  AST 16 21 31   ALT 9 14 16   ALKPHOS 93 80 87  BILITOT 0.3 0.5  0.6  PROT 6.9 6.2* 6.5  ALBUMIN 4.0 3.3* 3.5     Recent Labs  Lab 03/23/23 1241  LIPASE 34     HbA1C: No results for input(s): "HGBA1C" in the last 72 hours.   CBG: Recent Labs  Lab 03/21/23 2154 03/22/23 0211 03/22/23 0553 03/22/23  0743 03/22/23 1041  GLUCAP 193* 172* 133* 151* 230*     Lipid Profile: Recent Labs    03/24/23 0447  CHOL 181  HDL 42  LDLCALC 119*  TRIG 101  CHOLHDL 4.3     Thyroid Function Tests: Recent Labs    03/24/23 0446  TSH 1.974      Radiology Studies: CT ABDOMEN PELVIS WO CONTRAST  Result Date: 03/23/2023 CLINICAL DATA:  Acute abdominal pain.  Metastatic breast cancer. EXAM: CT ABDOMEN AND PELVIS WITHOUT CONTRAST TECHNIQUE: Multidetector CT imaging of the abdomen and pelvis was performed following the standard protocol without IV contrast. RADIATION DOSE REDUCTION: This exam was performed according to the departmental dose-optimization program which includes automated exposure control, adjustment of the mA and/or kV according to patient size and/or use of iterative reconstruction technique. COMPARISON:  None Available.  01/19/2023 FINDINGS: Lower chest: Unremarkable. Hepatobiliary: 11 x 10 mm hypodensity in the dome of the liver was measured previously at 3.2 x 2.6 cm and characterized as metastases. There is no evidence for gallstones, gallbladder wall thickening, or pericholecystic fluid. No intrahepatic or extrahepatic biliary dilation. Pancreas: No focal mass lesion. No dilatation of the main duct. No intraparenchymal cyst. No peripancreatic edema. Spleen: No splenomegaly. No focal mass lesion. Adrenals/Urinary Tract: Stable bilateral adrenal thickening without discrete nodule or mass. Right kidney unremarkable. Small cyst upper pole left kidney is stable since prior. No followup imaging is recommended. No evidence for hydroureter. The urinary bladder appears normal for the degree of distention. Stomach/Bowel: Tiny hiatal hernia.  Stomach otherwise unremarkable. Duodenum is normally positioned as is the ligament of Treitz. No small bowel wall thickening. No small bowel dilatation. The terminal ileum is normal. The appendix is not well visualized, but there is no edema or inflammation in the region of the cecum. No gross colonic mass. No colonic wall thickening. Prominent stool volume noted right, transverse, and proximal descending colon. Vascular/Lymphatic: There is moderate atherosclerotic calcification of the abdominal aorta. Infrarenal abdominal aorta measures 3.1 cm diameter. There is no gastrohepatic or hepatoduodenal ligament lymphadenopathy. The portal caval lymph node of concern previously is within normal limits for size today. No retroperitoneal or mesenteric lymphadenopathy. No pelvic sidewall lymphadenopathy. Reproductive: Hysterectomy.  There is no adnexal mass. Other: No intraperitoneal free fluid. Musculoskeletal: No worrisome lytic or sclerotic osseous abnormality. IMPRESSION: 1. No acute findings in the abdomen or pelvis. Specifically, no findings to explain the patient's history of abdominal pain. 2. 11 x 10 mm hypodensity in the dome of the liver was measured previously at 3.2 x 2.6 cm and characterized as metastases. 3. Index portacaval node measured previously is unenlarged by CT size criteria today. 4. Prominent stool volume. Imaging features could be compatible with constipation in the appropriate clinical setting. 5. Tiny hiatal hernia. 6. Infrarenal abdominal aortic aneurysm. Recommend follow-up ultrasound every 3 years. This recommendation follows ACR consensus guidelines: White Paper of the ACR Incidental Findings Committee II on Vascular Findings. J Am Coll Radiol 2013; 10:789-794. 7.  Aortic Atherosclerosis (ICD10-I70.0). Electronically Signed   By: Kennith Center M.D.   On: 03/23/2023 14:48   DG Chest Port 1 View  Result Date: 03/23/2023 CLINICAL DATA:  Abdominal distention. History of metastatic breast  cancer. EXAM: PORTABLE CHEST 1 VIEW COMPARISON:  X-ray chest 03/21/2023.  CT scan 01/19/2023 FINDINGS: Enlarged cardiopericardial silhouette with a calcified aorta left upper chest defibrillator with leads overlying the right side of the heart. Right-sided IJ chest port with tip along the SVC right atrial junction region.  The port is accessed. No consolidation, pneumothorax or effusion. No edema. Old rib fractures. Overlapping cardiac leads. Surgical clips in the left axillary region. IMPRESSION: Enlarged heart.  Chest port.  Defibrillator. Electronically Signed   By: Karen Kays M.D.   On: 03/23/2023 12:51       LOS: 0 days   Achsah Mcquade Rito Ehrlich  [facility] Pager on [email or web address]  03/25/2023, 10:35 AM

## 2023-03-25 NOTE — Progress Notes (Signed)
Dr. Nelly Laurence requests 2 week gen cards and EP f/u for this patient. He confirmed she wants to follow with [facility] moving forward. I have sent a message to our office's scheduling team requesting these follow-up appointments, and our office will call the patient with this information.

## 2023-04-03 MED FILL — Dexamethasone Sodium Phosphate Inj 100 MG/10ML: INTRAMUSCULAR | Qty: 1 | Status: AC

## 2023-04-03 MED FILL — Fosaprepitant Dimeglumine For IV Infusion 150 MG (Base Eq): INTRAVENOUS | Qty: 5 | Status: AC

## 2023-04-03 NOTE — Progress Notes (Signed)
Patient Care Team: Dortha Kern, MD as PCP - General (Family Medicine) Dorothy Puffer, MD as Consulting Physician (Radiation Oncology) Serena Croissant, MD as Consulting Physician (Hematology and Oncology) Harriette Bouillon, MD as Consulting Physician (General Surgery) Windell Norfolk, MD as Consulting Physician (Neurology)  DIAGNOSIS: No diagnosis found.  SUMMARY OF ONCOLOGIC HISTORY: Oncology History  Malignant neoplasm of upper-outer quadrant of left breast in female, estrogen receptor positive (HCC)  03/22/2020 Initial Diagnosis   Diffuse left breast swelling. skin thickening and two areas of irregular hypoechogenicity in the 2-3 o'clock region, 4.9cm, and at least 4 abnormal lymph nodes  with cortical thickening. 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    10/28/2020 - 12/17/2020 Radiation Therapy   The patient initially received a dose of 50.4 Gy in 28 fractions to the left breast and supraclavicular region using whole-breast tangent fields. This was delivered using a 3-D conformal technique. The pt received a boost delivering an additional 10 Gy in 5 fractions using a electron boost with electrons. The total dose was 60.4 Gy.   11/2020 - 11/2027 Anti-estrogen oral therapy   Anastrozole   01/30/2022 Miscellaneous   Guardant360:ERBB2 (exon 20 insertion): Benefit from Enhertu, MSI high not detected   11/20/2022 Cancer Staging   Staging form: Breast, AJCC 8th Edition - Pathologic: Stage IV (pM1)  - Signed by Loa Socks, NP on 11/20/2022   11/20/2022 -  Chemotherapy   Patient is on Treatment Plan : BREAST METASTATIC Sacituzumab govitecan-hziy Drinda Butts) D1,8 q21d       CHIEF COMPLIANT: metastatic breast cancer cycle 7 Drinda Butts   INTERVAL HISTORY: Colleen Lowery is a 82 y.o. with above-mentioned history of metastatic breast cancer. She presents to the clinic today for a follow-up.   ALLERGIES:  is allergic to alcohol, contrast media [iodinated contrast media], iodine, atorvastatin, dust mite extract, latex, lisinopril, molds & smuts, and wound dressing adhesive.  MEDICATIONS:  Current Outpatient Medications  Medication Sig Dispense Refill   alendronate (FOSAMAX) 70 MG tablet Take 1 tablet (70 mg total) by mouth once a week. Take with a full glass of water on an empty stomach. 12 tablet 3   amiodarone (PACERONE) 400 MG tablet Take 1 tablet (400 mg total) by mouth 2 (two) times daily for 14 days. 28 tablet 0   Ascorbic Acid (VITAMIN C PO) Take 2,000 mg by mouth daily.     B Complex Vitamins (VITAMIN B COMPLEX) TABS Take 2 tablets by mouth daily.     Beta Carotene (VITAMIN A) 25000 UNIT capsule Take 50,000 Units by mouth daily.     Cholecalciferol (VITAMIN D3) 5000 units CAPS Take 5,000 Units by mouth daily. 2500 units per cap     dicyclomine (BENTYL) 20 MG tablet Take 1 tablet (20 mg total) by mouth every 6 (six) hours as needed for spasms (stomach cramps). 30 tablet 0   Flaxseed, Linseed, (FLAXSEED OIL) 1000 MG CAPS Take 3,000 mg by mouth daily. Taking as needed     furosemide (  LASIX) 20 MG tablet Take 20 mg by mouth daily.     Homeopathic Products (ARNICARE PAIN RELIEF EX) Apply 1 application  topically daily as needed (pain).     levETIRAcetam (KEPPRA) 750 MG tablet Take 1 tablet (750 mg total) by mouth 2 (two) times daily. 60 tablet 0   lidocaine-prilocaine (EMLA) cream Apply to affected area once (Patient taking differently: Apply 1 Application topically daily as needed  (For pain).) 30 g 3   liothyronine (CYTOMEL) 25 MCG tablet Take 25 mcg by mouth daily.     losartan (COZAAR) 50 MG tablet Take 50 mg by mouth daily.     MAGNESIUM PO Take 5 mLs by mouth daily. Liquid 2.5 ml/150 mg     metoprolol succinate (TOPROL XL) 25 MG 24 hr tablet Take 1 tablet (25 mg total) by mouth daily. 30 tablet 1   Misc Natural Products (BLOOD SUGAR BALANCE PO) Take 3 capsules by mouth daily. Blood Sugar Manager     ondansetron (ZOFRAN) 8 MG tablet Take 1 tablet (8 mg total) by mouth every 8 (eight) hours as needed for nausea or vomiting. Take 1 tablets (8mg  total) by mouth every 8hrs as needed. Start on the third day after chemotherapy. 30 tablet 1   OVER THE COUNTER MEDICATION Take 1 Scoop by mouth daily. Medication: Branched chain amino acids 4000  Patient states "taking for muscles"     OVER THE COUNTER MEDICATION Take 400-800 mg by mouth daily as needed (Based on Blood glucose). Medication: gymnema sylvestre Patient takes for "lowering blood glucose"     OVER THE COUNTER MEDICATION Take 5 capsules by mouth daily. Medication: Pau d' arco 500 mg each  Patient states now taking liquid and "keeps mold down"     OVER THE COUNTER MEDICATION Take 6 capsules by mouth daily. Medication: Caprylic acid Patient taking to "lower candida risk"     OVER THE COUNTER MEDICATION Take 5 capsules by mouth daily. Medication: Pancreatic enzymes Patient taking "to get fluid out"     OVER THE COUNTER MEDICATION Take 3 capsules by mouth daily. Raw Adrenal Patient taking for "overall energy"     OVER THE COUNTER MEDICATION Take 6 capsules by mouth daily. Beef Pancreas Patient taking for "pancreatic function improvement and lymphatic system improvement"     PANTETHINE PO Take 1-2 tablets by mouth daily as needed (Itching). Medication: Vitamin B5     polyethylene glycol powder (GLYCOLAX/MIRALAX) 17 GM/SCOOP powder Take 17 g by mouth 2 (two) times daily. 255 g 0   potassium chloride SA (K-DUR,KLOR-CON)  20 MEQ tablet Take 20 mEq by mouth 2 (two) times daily.     prochlorperazine (COMPAZINE) 10 MG tablet Take 1 tablet (10 mg total) by mouth every 6 (six) hours as needed for nausea or vomiting. 30 tablet 1   PROLENSA 0.07 % SOLN Place 1 drop into the right eye 4 (four) times daily.     Propylene Glycol (SYSTANE BALANCE) 0.6 % SOLN Place 2 drops into the right eye in the morning and at bedtime.     TRUE METRIX BLOOD GLUCOSE TEST test strip SMARTSIG:Via Meter     TRUEplus Lancets 33G MISC      No current facility-administered medications for this visit.    PHYSICAL EXAMINATION: ECOG PERFORMANCE STATUS: {CHL ONC ECOG PS:[id number]}  There were no vitals filed for this visit. There were no vitals filed for this visit.  BREAST:*** No palpable masses or nodules in either right or left breasts. No palpable axillary  supraclavicular or infraclavicular adenopathy no breast tenderness or nipple discharge. (exam performed in the presence of a chaperone)  LABORATORY DATA:  I have reviewed the data as listed    Latest Ref Rng & Units 03/25/2023    2:40 AM 03/24/2023    4:46 AM 03/23/2023    7:23 PM  CMP  Glucose 70 - 99 mg/dL 147  90    BUN 8 - 23 mg/dL 17  10    Creatinine 8.29 - 1.00 mg/dL 5.62  1.30  8.65   Sodium 135 - 145 mmol/L 136  138    Potassium 3.5 - 5.1 mmol/L 4.5  3.8    Chloride 98 - 111 mmol/L 103  108    CO2 22 - 32 mmol/L 25  23    Calcium 8.9 - 10.3 mg/dL 9.3  8.6      Lab Results  Component Value Date   WBC 5.0 03/25/2023   HGB 11.9 (L) 03/25/2023   HCT 36.9 03/25/2023   MCV 91.3 03/25/2023   PLT 201 03/25/2023   NEUTROABS 3.2 03/23/2023    ASSESSMENT & PLAN:  No problem-specific Assessment & Plan notes found for this encounter.    No orders of the defined types were placed in this encounter.  The patient has a good understanding of the overall plan. she agrees with it. she will call with any problems that may develop before the next visit here. Total time  spent: 30 mins including face to face time and time spent for planning, charting and co-ordination of care   Sherlyn Lick, CMA 04/03/23    I Janan Ridge am acting as a Neurosurgeon for [employer]  ***

## 2023-04-04 ENCOUNTER — Inpatient Hospital Stay: Payer: Medicare HMO

## 2023-04-04 ENCOUNTER — Inpatient Hospital Stay (HOSPITAL_BASED_OUTPATIENT_CLINIC_OR_DEPARTMENT_OTHER): Payer: Medicare HMO | Admitting: Hematology and Oncology

## 2023-04-04 ENCOUNTER — Other Ambulatory Visit: Payer: Self-pay

## 2023-04-04 VITALS — BP 188/115 | HR 72 | Temp 98.0°F

## 2023-04-04 VITALS — BP 185/82 | HR 65 | Temp 97.9°F | Resp 17 | Wt 133.2 lb

## 2023-04-04 DIAGNOSIS — Z95828 Presence of other vascular implants and grafts: Secondary | ICD-10-CM

## 2023-04-04 DIAGNOSIS — Z170 Estrogen receptor positive status [ER+]: Secondary | ICD-10-CM

## 2023-04-04 DIAGNOSIS — Z5112 Encounter for antineoplastic immunotherapy: Secondary | ICD-10-CM | POA: Diagnosis not present

## 2023-04-04 DIAGNOSIS — C50412 Malignant neoplasm of upper-outer quadrant of left female breast: Secondary | ICD-10-CM | POA: Diagnosis not present

## 2023-04-04 LAB — CMP (CANCER CENTER ONLY)
ALT: 7 U/L (ref 0–44)
AST: 12 U/L — ABNORMAL LOW (ref 15–41)
Albumin: 3.9 g/dL (ref 3.5–5.0)
Alkaline Phosphatase: 106 U/L (ref 38–126)
Anion gap: 5 (ref 5–15)
BUN: 8 mg/dL (ref 8–23)
CO2: 27 mmol/L (ref 22–32)
Calcium: 8.9 mg/dL (ref 8.9–10.3)
Chloride: 104 mmol/L (ref 98–111)
Creatinine: 0.86 mg/dL (ref 0.44–1.00)
GFR, Estimated: >60 mL/min (ref >60)
Glucose, Bld: 158 mg/dL — ABNORMAL HIGH (ref 70–99)
Potassium: 3.9 mmol/L (ref 3.5–5.1)
Sodium: 136 mmol/L (ref 135–145)
Total Bilirubin: 0.4 mg/dL (ref 0.3–1.2)
Total Protein: 6.4 g/dL — ABNORMAL LOW (ref 6.5–8.1)

## 2023-04-04 LAB — CBC WITH DIFFERENTIAL (CANCER CENTER ONLY)
Abs Immature Granulocytes: 0.02 10*3/uL (ref 0.00–0.07)
Basophils Absolute: 0 10*3/uL (ref 0.0–0.1)
Basophils Relative: 1 %
Eosinophils Absolute: 0.1 10*3/uL (ref 0.0–0.5)
Eosinophils Relative: 2 %
HCT: 35.6 % — ABNORMAL LOW (ref 36.0–46.0)
Hemoglobin: 11.9 g/dL — ABNORMAL LOW (ref 12.0–15.0)
Immature Granulocytes: 0 %
Lymphocytes Relative: 17 %
Lymphs Abs: 0.8 10*3/uL (ref 0.7–4.0)
MCH: 30 pg (ref 26.0–34.0)
MCHC: 33.4 g/dL (ref 30.0–36.0)
MCV: 89.7 fL (ref 80.0–100.0)
Monocytes Absolute: 0.6 10*3/uL (ref 0.1–1.0)
Monocytes Relative: 13 %
Neutro Abs: 3.3 10*3/uL (ref 1.7–7.7)
Neutrophils Relative %: 67 %
Platelet Count: 246 10*3/uL (ref 150–400)
RBC: 3.97 MIL/uL (ref 3.87–5.11)
RDW: 14.9 % (ref 11.5–15.5)
WBC Count: 4.9 10*3/uL (ref 4.0–10.5)
nRBC: 0 % (ref 0.0–0.2)

## 2023-04-04 LAB — PHOSPHORUS: Phosphorus: 3.5 mg/dL (ref 2.5–4.6)

## 2023-04-04 LAB — MAGNESIUM: Magnesium: 1.9 mg/dL (ref 1.7–2.4)

## 2023-04-04 MED ORDER — ACETAMINOPHEN 325 MG PO TABS
650.0000 mg | ORAL_TABLET | Freq: Once | ORAL | Status: AC
  Administered 2023-04-04: 650 mg via ORAL
  Filled 2023-04-04: qty 2

## 2023-04-04 MED ORDER — SODIUM CHLORIDE 0.9% FLUSH
10.0000 mL | Freq: Once | INTRAVENOUS | Status: AC
  Administered 2023-04-04: 10 mL

## 2023-04-04 MED ORDER — SODIUM CHLORIDE 0.9% FLUSH
10.0000 mL | INTRAVENOUS | Status: DC | PRN
  Administered 2023-04-04: 10 mL

## 2023-04-04 MED ORDER — SODIUM CHLORIDE 0.9 % IV SOLN
150.0000 mg | Freq: Once | INTRAVENOUS | Status: AC
  Administered 2023-04-04: 150 mg via INTRAVENOUS
  Filled 2023-04-04: qty 150

## 2023-04-04 MED ORDER — PALONOSETRON HCL INJECTION 0.25 MG/5ML
0.2500 mg | Freq: Once | INTRAVENOUS | Status: AC
  Administered 2023-04-04: 0.25 mg via INTRAVENOUS
  Filled 2023-04-04: qty 5

## 2023-04-04 MED ORDER — FAMOTIDINE 20 MG IN NS 100 ML IVPB
20.0000 mg | Freq: Once | INTRAVENOUS | Status: AC
  Administered 2023-04-04: 20 mg via INTRAVENOUS
  Filled 2023-04-04: qty 100

## 2023-04-04 MED ORDER — CETIRIZINE HCL 10 MG/ML IV SOLN
10.0000 mg | Freq: Once | INTRAVENOUS | Status: AC
  Administered 2023-04-04: 10 mg via INTRAVENOUS
  Filled 2023-04-04: qty 1

## 2023-04-04 MED ORDER — SODIUM CHLORIDE 0.9 % IV SOLN
6.0000 mg/kg | Freq: Once | INTRAVENOUS | Status: AC
  Administered 2023-04-04: 360 mg via INTRAVENOUS
  Filled 2023-04-04: qty 36

## 2023-04-04 MED ORDER — HEPARIN SOD (PORK) LOCK FLUSH 100 UNIT/ML IV SOLN
500.0000 [IU] | Freq: Once | INTRAVENOUS | Status: AC | PRN
  Administered 2023-04-04: 500 [IU]

## 2023-04-04 MED ORDER — SODIUM CHLORIDE 0.9 % IV SOLN
10.0000 mg | Freq: Once | INTRAVENOUS | Status: AC
  Administered 2023-04-04: 10 mg via INTRAVENOUS
  Filled 2023-04-04: qty 10

## 2023-04-04 MED ORDER — SODIUM CHLORIDE 0.9 % IV SOLN: Freq: Once | INTRAVENOUS | Status: AC

## 2023-04-04 NOTE — Assessment & Plan Note (Addendum)
03/22/2020:diffuse left breast swelling. skin thickening and two areas of irregular hypoechogenicity in the 2-3 o'clock region, 4.9cm, and at least 4 abnormal lymph nodes  with cortical thickening. 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  Neoadjuvant chemotherapy with CMF 2.  09/14/2020:Left mastectomy ([name]): invasive and in situ ductal carcinoma, grade 2, 15cm, clear margins, with 14/14 lymph nodes positive for metastatic carcinoma with extracapsular extension.  ER 30%, PR 10%, HER-2 negative, Ki-67 15% 3.  Adjuvant radiation 10/28/2020-12/17/2020 4.  Followed by adjuvant antiestrogen therapy with anastrozole started 01/11/2021 5.  12/07/2021: Liver metastases: Biopsy consistent with metastatic breast cancer ER 0%, PR 0%, HER2 1+ 6.  Capecitabine 1000 mg p.o. twice daily 2 weeks on 1 week off started 01/11/2022-10/31/2022.  Progression ----------------------------------------------------------------------------------------------------------------------- Current treatment: Trodelvy started 11/20/2022, today is cycle 7 Toxicities: Fatigue is moderate in nature possibly due to lack of interest rather than side effect of treatment Fogginess in the head:  Constipation   Purel induced seizure-like activity: Patient says that he is allergic to purell   Hospitalization 03/23/2023-03/25/2023: Breakthrough seizures, AICD firing and abdominal pain treated with amiodarone, CT abdomen pelvis: Liver metastases 1.1 cm (previously 3.2 cm) portacaval lymph node stable  We can cancel the June CT scan since she had a CT scan in May during hospitalization. She will return in 1 week for cycle 7-day 8 Return to clinic in 3 weeks

## 2023-04-04 NOTE — Progress Notes (Signed)
BP high at d/c but pt asymptomatic & states her  BP is always high in hospital.  She will monitor at home & informed to inform her PCP if continues to be high.

## 2023-04-04 NOTE — Patient Instructions (Signed)
[facility]  Discharge Instructions: Thank you for choosing [facility] to provide your oncology and hematology care.   If you have a lab appointment with the Cancer Center, please go directly to the Cancer Center and check in at the registration area.   Wear comfortable clothing and clothing appropriate for easy access to any Portacath or PICC line.   We strive to give you quality time with your provider. You may need to reschedule your appointment if you arrive late (15 or more minutes).  Arriving late affects you and other patients whose appointments are after yours.  Also, if you miss three or more appointments without notifying the office, you may be dismissed from the clinic at the provider's discretion.      For prescription refill requests, have your pharmacy contact our office and allow 72 hours for refills to be completed.    Today you received the following chemotherapy and/or immunotherapy agents: Trodelvy      To help prevent nausea and vomiting after your treatment, we encourage you to take your nausea medication as directed.  BELOW ARE SYMPTOMS THAT SHOULD BE REPORTED IMMEDIATELY: *FEVER GREATER THAN 100.4 F (38 C) OR HIGHER *CHILLS OR SWEATING *NAUSEA AND VOMITING THAT IS NOT CONTROLLED WITH YOUR NAUSEA MEDICATION *UNUSUAL SHORTNESS OF BREATH *UNUSUAL BRUISING OR BLEEDING *URINARY PROBLEMS (pain or burning when urinating, or frequent urination) *BOWEL PROBLEMS (unusual diarrhea, constipation, pain near the anus) TENDERNESS IN MOUTH AND THROAT WITH OR WITHOUT PRESENCE OF ULCERS (sore throat, sores in mouth, or a toothache) UNUSUAL RASH, SWELLING OR PAIN  UNUSUAL VAGINAL DISCHARGE OR ITCHING   Items with * indicate a potential emergency and should be followed up as soon as possible or go to the Emergency Department if any problems should occur.  Please show the CHEMOTHERAPY ALERT CARD or IMMUNOTHERAPY ALERT CARD at  check-in to the Emergency Department and triage nurse.  Should you have questions after your visit or need to cancel or reschedule your appointment, please contact [facility]  Dept: [phone]  and follow the prompts.  Office hours are 8:00 a.m. to 4:30 p.m. Monday - Friday. Please note that voicemails left after 4:00 p.m. may not be returned until the following business day.  We are closed weekends and major holidays. You have access to a nurse at all times for urgent questions. Please call the main number to the clinic Dept: [phone] and follow the prompts.   For any non-urgent questions, you may also contact your provider using MyChart. We now offer e-Visits for anyone 18 and older to request care online for non-urgent symptoms. For details visit [email or web address].   Also download the MyChart app! Go to the app store, search "MyChart", open the app, select , and log in with your MyChart username and password.   

## 2023-04-10 MED FILL — Fosaprepitant Dimeglumine For IV Infusion 150 MG (Base Eq): INTRAVENOUS | Qty: 5 | Status: AC

## 2023-04-10 MED FILL — Dexamethasone Sodium Phosphate Inj 100 MG/10ML: INTRAMUSCULAR | Qty: 1 | Status: AC

## 2023-04-11 ENCOUNTER — Inpatient Hospital Stay: Payer: Medicare HMO

## 2023-04-11 ENCOUNTER — Inpatient Hospital Stay (HOSPITAL_BASED_OUTPATIENT_CLINIC_OR_DEPARTMENT_OTHER): Payer: Medicare HMO

## 2023-04-11 VITALS — BP 176/100 | HR 78 | Temp 98.2°F | Resp 16 | Wt 135.0 lb

## 2023-04-11 DIAGNOSIS — Z170 Estrogen receptor positive status [ER+]: Secondary | ICD-10-CM

## 2023-04-11 DIAGNOSIS — Z95828 Presence of other vascular implants and grafts: Secondary | ICD-10-CM

## 2023-04-11 DIAGNOSIS — Z5112 Encounter for antineoplastic immunotherapy: Secondary | ICD-10-CM | POA: Diagnosis not present

## 2023-04-11 LAB — CMP (CANCER CENTER ONLY)
ALT: 8 U/L (ref 0–44)
AST: 11 U/L — ABNORMAL LOW (ref 15–41)
Albumin: 3.9 g/dL (ref 3.5–5.0)
Alkaline Phosphatase: 93 U/L (ref 38–126)
Anion gap: 6 (ref 5–15)
BUN: 11 mg/dL (ref 8–23)
CO2: 26 mmol/L (ref 22–32)
Calcium: 8.7 mg/dL — ABNORMAL LOW (ref 8.9–10.3)
Chloride: 106 mmol/L (ref 98–111)
Creatinine: 0.87 mg/dL (ref 0.44–1.00)
GFR, Estimated: >60 mL/min (ref >60)
Glucose, Bld: 157 mg/dL — ABNORMAL HIGH (ref 70–99)
Potassium: 3.9 mmol/L (ref 3.5–5.1)
Sodium: 138 mmol/L (ref 135–145)
Total Bilirubin: 0.4 mg/dL (ref 0.3–1.2)
Total Protein: 6.6 g/dL (ref 6.5–8.1)

## 2023-04-11 LAB — CBC WITH DIFFERENTIAL (CANCER CENTER ONLY)
Abs Immature Granulocytes: 0.09 10*3/uL — ABNORMAL HIGH (ref 0.00–0.07)
Basophils Absolute: 0 10*3/uL (ref 0.0–0.1)
Basophils Relative: 1 %
Eosinophils Absolute: 0.1 10*3/uL (ref 0.0–0.5)
Eosinophils Relative: 1 %
HCT: 35.8 % — ABNORMAL LOW (ref 36.0–46.0)
Hemoglobin: 11.8 g/dL — ABNORMAL LOW (ref 12.0–15.0)
Immature Granulocytes: 2 %
Lymphocytes Relative: 16 %
Lymphs Abs: 0.8 10*3/uL (ref 0.7–4.0)
MCH: 29.5 pg (ref 26.0–34.0)
MCHC: 33 g/dL (ref 30.0–36.0)
MCV: 89.5 fL (ref 80.0–100.0)
Monocytes Absolute: 0.7 10*3/uL (ref 0.1–1.0)
Monocytes Relative: 13 %
Neutro Abs: 3.5 10*3/uL (ref 1.7–7.7)
Neutrophils Relative %: 67 %
Platelet Count: 207 10*3/uL (ref 150–400)
RBC: 4 MIL/uL (ref 3.87–5.11)
RDW: 15.5 % (ref 11.5–15.5)
WBC Count: 5.2 10*3/uL (ref 4.0–10.5)
nRBC: 0 % (ref 0.0–0.2)

## 2023-04-11 LAB — PHOSPHORUS: Phosphorus: 3.9 mg/dL (ref 2.5–4.6)

## 2023-04-11 LAB — MAGNESIUM: Magnesium: 1.8 mg/dL (ref 1.7–2.4)

## 2023-04-11 MED ORDER — SODIUM CHLORIDE 0.9 % IV SOLN
150.0000 mg | Freq: Once | INTRAVENOUS | Status: AC
  Administered 2023-04-11: 150 mg via INTRAVENOUS
  Filled 2023-04-11: qty 150

## 2023-04-11 MED ORDER — SODIUM CHLORIDE 0.9 % IV SOLN
10.0000 mg | Freq: Once | INTRAVENOUS | Status: AC
  Administered 2023-04-11: 10 mg via INTRAVENOUS
  Filled 2023-04-11: qty 10

## 2023-04-11 MED ORDER — PALONOSETRON HCL INJECTION 0.25 MG/5ML
0.2500 mg | Freq: Once | INTRAVENOUS | Status: AC
  Administered 2023-04-11: 0.25 mg via INTRAVENOUS
  Filled 2023-04-11: qty 5

## 2023-04-11 MED ORDER — FAMOTIDINE IN NACL 20-0.9 MG/50ML-% IV SOLN
20.0000 mg | Freq: Once | INTRAVENOUS | Status: AC
  Administered 2023-04-11: 20 mg via INTRAVENOUS
  Filled 2023-04-11: qty 50

## 2023-04-11 MED ORDER — SODIUM CHLORIDE 0.9% FLUSH
10.0000 mL | Freq: Once | INTRAVENOUS | Status: AC
  Administered 2023-04-11: 10 mL

## 2023-04-11 MED ORDER — ATROPINE SULFATE 1 MG/ML IV SOLN: 0.5000 mg | Freq: Once | INTRAVENOUS | Status: DC | PRN

## 2023-04-11 MED ORDER — SODIUM CHLORIDE 0.9 % IV SOLN
6.0000 mg/kg | Freq: Once | INTRAVENOUS | Status: AC
  Administered 2023-04-11: 360 mg via INTRAVENOUS
  Filled 2023-04-11: qty 36

## 2023-04-11 MED ORDER — SODIUM CHLORIDE 0.9% FLUSH
10.0000 mL | INTRAVENOUS | Status: DC | PRN
  Administered 2023-04-11: 10 mL

## 2023-04-11 MED ORDER — CETIRIZINE HCL 10 MG/ML IV SOLN
10.0000 mg | Freq: Once | INTRAVENOUS | Status: AC
  Administered 2023-04-11: 10 mg via INTRAVENOUS
  Filled 2023-04-11: qty 1

## 2023-04-11 MED ORDER — SODIUM CHLORIDE 0.9 % IV SOLN: Freq: Once | INTRAVENOUS | Status: AC

## 2023-04-11 MED ORDER — ACETAMINOPHEN 325 MG PO TABS
650.0000 mg | ORAL_TABLET | Freq: Once | ORAL | Status: AC
  Administered 2023-04-11: 650 mg via ORAL
  Filled 2023-04-11: qty 2

## 2023-04-11 MED ORDER — HEPARIN SOD (PORK) LOCK FLUSH 100 UNIT/ML IV SOLN
500.0000 [IU] | Freq: Once | INTRAVENOUS | Status: AC | PRN
  Administered 2023-04-11: 500 [IU]

## 2023-04-11 NOTE — Patient Instructions (Signed)
[facility]  Discharge Instructions: Thank you for choosing [facility] to provide your oncology and hematology care.   If you have a lab appointment with the Cancer Center, please go directly to the Cancer Center and check in at the registration area.   Wear comfortable clothing and clothing appropriate for easy access to any Portacath or PICC line.   We strive to give you quality time with your provider. You may need to reschedule your appointment if you arrive late (15 or more minutes).  Arriving late affects you and other patients whose appointments are after yours.  Also, if you miss three or more appointments without notifying the office, you may be dismissed from the clinic at the provider's discretion.      For prescription refill requests, have your pharmacy contact our office and allow 72 hours for refills to be completed.    Today you received the following chemotherapy and/or immunotherapy agents: [id number]      To help prevent nausea and vomiting after your treatment, we encourage you to take your nausea medication as directed.  BELOW ARE SYMPTOMS THAT SHOULD BE REPORTED IMMEDIATELY: *FEVER GREATER THAN 100.4 F (38 C) OR HIGHER *CHILLS OR SWEATING *NAUSEA AND VOMITING THAT IS NOT CONTROLLED WITH YOUR NAUSEA MEDICATION *UNUSUAL SHORTNESS OF BREATH *UNUSUAL BRUISING OR BLEEDING *URINARY PROBLEMS (pain or burning when urinating, or frequent urination) *BOWEL PROBLEMS (unusual diarrhea, constipation, pain near the anus) TENDERNESS IN MOUTH AND THROAT WITH OR WITHOUT PRESENCE OF ULCERS (sore throat, sores in mouth, or a toothache) UNUSUAL RASH, SWELLING OR PAIN  UNUSUAL VAGINAL DISCHARGE OR ITCHING   Items with * indicate a potential emergency and should be followed up as soon as possible or go to the Emergency Department if any problems should occur.  Please show the CHEMOTHERAPY ALERT CARD or IMMUNOTHERAPY  ALERT CARD at check-in to the Emergency Department and triage nurse.  Should you have questions after your visit or need to cancel or reschedule your appointment, please contact [facility]  Dept: [phone]  and follow the prompts.  Office hours are 8:00 a.m. to 4:30 p.m. Monday - Friday. Please note that voicemails left after 4:00 p.m. may not be returned until the following business day.  We are closed weekends and major holidays. You have access to a nurse at all times for urgent questions. Please call the main number to the clinic Dept: [phone] and follow the prompts.   For any non-urgent questions, you may also contact your provider using MyChart. We now offer e-Visits for anyone 18 and older to request care online for non-urgent symptoms. For details visit [email or web address].   Also download the MyChart app! Go to the app store, search "MyChart", open the app, select [facility], and log in with your MyChart username and password.   

## 2023-04-12 ENCOUNTER — Ambulatory Visit (HOSPITAL_BASED_OUTPATIENT_CLINIC_OR_DEPARTMENT_OTHER): Payer: Medicare HMO | Admitting: Family

## 2023-04-12 ENCOUNTER — Encounter (HOSPITAL_BASED_OUTPATIENT_CLINIC_OR_DEPARTMENT_OTHER): Payer: Self-pay | Admitting: Family

## 2023-04-12 VITALS — BP 140/70 | HR 78 | Ht 62.0 in | Wt 135.0 lb

## 2023-04-12 DIAGNOSIS — I5022 Chronic systolic (congestive) heart failure: Secondary | ICD-10-CM | POA: Diagnosis not present

## 2023-04-12 DIAGNOSIS — E039 Hypothyroidism, unspecified: Secondary | ICD-10-CM

## 2023-04-12 DIAGNOSIS — Z79899 Other long term (current) drug therapy: Secondary | ICD-10-CM

## 2023-04-12 DIAGNOSIS — I25118 Atherosclerotic heart disease of native coronary artery with other forms of angina pectoris: Secondary | ICD-10-CM

## 2023-04-12 DIAGNOSIS — I472 Ventricular tachycardia, unspecified: Secondary | ICD-10-CM | POA: Diagnosis not present

## 2023-04-12 DIAGNOSIS — Z95810 Presence of automatic (implantable) cardiac defibrillator: Secondary | ICD-10-CM

## 2023-04-12 DIAGNOSIS — E785 Hyperlipidemia, unspecified: Secondary | ICD-10-CM

## 2023-04-12 DIAGNOSIS — I10 Essential (primary) hypertension: Secondary | ICD-10-CM

## 2023-04-12 MED ORDER — ENTRESTO 49-51 MG PO TABS: 1.0000 | ORAL_TABLET | Freq: Two times a day (BID) | ORAL | 0 refills | Status: DC

## 2023-04-12 MED ORDER — AMIODARONE HCL 400 MG PO TABS: 400.0000 mg | ORAL_TABLET | Freq: Every day | ORAL | 3 refills | Status: DC

## 2023-04-12 MED ORDER — ENTRESTO 49-51 MG PO TABS: 1.0000 | ORAL_TABLET | Freq: Two times a day (BID) | ORAL | 3 refills | Status: DC

## 2023-04-12 NOTE — Progress Notes (Signed)
Office Visit    Patient Name: Colleen Lowery Date of Encounter: 04/15/2023  PCP:  Dortha Kern, MD   [facility]  Cardiologist:  Chilton Si, MD  Advanced Practice Provider:  No care team member to display Electrophysiologist:  Will Jorja Loa, MD      Chief Complaint    Colleen Lowery is a 82 y.o. female presents today for hospital follow up    Past Medical History    Past Medical History:  Diagnosis Date   AICD (automatic cardioverter/defibrillator) present    Allergy    Arthritis    FINGERS   Cancer (HCC)    left breast   Cataract    right   Crohn disease (HCC)    Depression    Diabetes mellitus without complication (HCC)    TYPE 2 , TAKING OTC MED FORGYMEMA   GERD (gastroesophageal reflux disease)    Headache    sinus   Heart failure, acute, systolic and diastolic (HCC) MARCH 2017 DX   History of colonic polyps    Hypertension    Hypothyroidism    per patient, takes OTC meds when temperature drops low   IBS (irritable bowel syndrome)    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   Seizures (HCC)    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, 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   Thyroid disease    AGE 64'S UNTIL  2016 OFF ALL THRYOID MEDS NOW    Wears dentures    Wears glasses    Past Surgical History:  Procedure Laterality Date   ABDOMINAL HYSTERECTOMY     PARTIAL   BREAST BIOPSY Left 04/01/2019   INVASIVE DUCTAL CARCINOMA   BREAST SURGERY     CARDIAC CATHETERIZATION  02/10/2016   Procedure: Right/Left Heart Cath and Coronary Angiography;  Surgeon: Marykay Lex, MD;  Location: [facility] INVASIVE CV LAB;  Service: Cardiovascular;;   CATARACT EXTRACTION W/ INTRAOCULAR LENS IMPLANT     left   COLONOSCOPY WITH PROPOFOL N/A 02/16/2017   Procedure: COLONOSCOPY WITH PROPOFOL;  Surgeon: Jeani Hawking, MD;  Location: [facility] ENDOSCOPY;  Service: Endoscopy;  Laterality: N/A;   COLONOSCOPY WITH PROPOFOL N/A 08/12/2021   Procedure: COLONOSCOPY WITH PROPOFOL;  Surgeon: Jeani Hawking, MD;  Location: [facility] ENDOSCOPY;  Service: Endoscopy;  Laterality: N/A;   EP IMPLANTABLE DEVICE N/A 02/10/2016   Procedure: ICD Implant;  Surgeon: Will Jorja Loa, MD;  Location: [facility] INVASIVE CV LAB;  Service: Cardiovascular;  Laterality: N/A;   ESOPHAGOGASTRODUODENOSCOPY (EGD) WITH PROPOFOL N/A 02/16/2017   Procedure: ESOPHAGOGASTRODUODENOSCOPY (EGD) WITH PROPOFOL;  Surgeon: Jeani Hawking, MD;  Location: [facility] ENDOSCOPY;  Service: Endoscopy;  Laterality: N/A;  reflux   IR IMAGING GUIDED PORT INSERTION  04/27/2020   IR IMAGING GUIDED PORT INSERTION  11/17/2022   MASTECTOMY Left    MASTECTOMY MODIFIED RADICAL Left 09/14/2020   Procedure: LEFT MASTECTOMY MODIFIED RADICAL;  Surgeon: Harriette Bouillon, MD;  Location: [facility] OR;  Service: General;  Laterality: Left;  PEC BLOCK   POLYPECTOMY  08/12/2021   Procedure: POLYPECTOMY;  Surgeon: Jeani Hawking, MD;  Location: [facility] ENDOSCOPY;  Service: Endoscopy;;   TONSILLECTOMY      Allergies  Allergies  Allergen Reactions   Alcohol Anaphylaxis and Swelling    Purell hand sanitizer  Swelling in the luNGS, CHEST AND THROAT and seizures Problem with smell not the Alcohol Other reaction(s): Other (See Comments) Causes seizures and swelling   Contrast Media [Iodinated Contrast Media] Anaphylaxis    Throat closed, was  40 yrs ago  *Iodine    Iodine Anaphylaxis    Throat closed, was 40 yrs ago   Atorvastatin     Myalgia    Dust Mite Extract Swelling    SWELLING IS GI SYSTEM   Latex     QUESTIONABLE INTOLERABLE MUCOUS OOZING OUT OF THROAT   Lisinopril Other (See Comments)    Pt does not remember reaction    Molds & Smuts Swelling    SWELLING IS IN GI SYSTEM   Wound Dressing Adhesive Other (See Comments)    Causes skin to tear    History of Present Illness    Colleen Lowery is a 82 y.o. 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  Admitted 5/10-5/12/24 after VT with AICD firing. She was started on Amiodarone.   Presents today for follow up independently. Had infusion with cancer team yesterday. Eats heart healthy diet frequently vegetarian meals or only ground [place]. No palpitations, no lightheadedness nor dizziness, no chest pain/pressure, tightness. Reports her breathing has been okay - she notes more phlegm which is clear. Takes OTC 'taurine' to help with this. Notes uses many supplements and encouraged to bring to her next visit for review. Blood pressure at home 140/90, 140/70.   EKGs/Labs/Other Studies Reviewed:   The following studies were reviewed today: Cardiac Studies & Procedures   CARDIAC CATHETERIZATION  CARDIAC CATHETERIZATION 02/10/2016  Narrative Images from the original result were not included. 1. 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 2. There is moderate left ventricular systolic dysfunction. Basal to mid inferior hypokinesis/akinesis 3. 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 4. Large PCWP V wave suggestive of potential ischemic MR 5. Severe systemic hypertension   The patient has ischemic cardiomyopathy with an occluded RCA and very fine collaterals to the distal RCA branch vessels. I suspect that this is a subacute occlusion dating back to her initial onset discomfort a week ago. The occlusion appears to be consolidated and likely difficult to open at this time. Could consider CTO intervention in the future.  Patient has severe systemic hypertension with elevated LVEDP and wedge pressure needs to be treated. She likely also needs additional diuresis.  Plan:  Return to nursing unit for continued medical management.  I have increased her Lasix to 40 mg twice a day by mouth  Will defer to primary team and electrophysiology catheter inside the benefit for ICD.    Marykay Lex,  M.D., M.S. Interventional Cardiologist  Pager # [phone] Phone # [phone] [address] [place], [place] [address]  Findings Coronary Findings Diagnostic  Dominance: Right  Left Main . Vessel is large. Vessel is angiographically normal.  Left Anterior Descending . Vessel is large. Discrete located at the major branch.  First Diagonal Branch The vessel is moderate in size.  Lateral First Diagonal Branch The vessel is small in size.  First Septal Branch The vessel is small in size.  Second Septal Branch The vessel is small in size.  Left Circumflex . Vessel is large. Vessel is angiographically normal. The vessel is tortuous.  Lateral First Obtuse Marginal Branch The vessel is small in size. Provides very scant collaterals to the RPL system. Very small spiderweb- like vessels  Right Coronary Artery . Vessel is large. The lesion is type C Diffuse with  heavy thrombus chronic total occlusion located at the bend.  Acute Marginal Branch The vessel is moderate in size.  Right Posterior Descending Artery The vessel is small in size. Spider web-like vessel. Collaterals RPDA filled by collaterals from Dist LAD.  Right Posterior Atrioventricular Artery The vessel is small in size.  First Right Posterolateral Branch Collaterals 1st RPL filled by collaterals from Lat 1st Mrg.  Intervention  No interventions have been documented.     ECHOCARDIOGRAM  ECHOCARDIOGRAM COMPLETE 03/25/2023  Narrative ECHOCARDIOGRAM REPORT    Patient Name:   MAELYNN GAGLIONE Date of Exam: 03/25/2023 Medical Rec #:  [id number]    Height:       62.0 in Accession #:    [id number]   Weight:       139.6 lb Date of Birth:  29-Aug-1941     BSA:          1.641 m Patient Age:    81 years     BP:           147/74 mmHg Patient Gender: F            HR:           63 bpm. Exam Location:  Inpatient  Procedure: 2D Echo, Cardiac Doppler and Color Doppler  Indications:     CHF  History:        Patient has prior history of Echocardiogram examinations, most recent 02/08/2016. CHF, CAD, Defibrillator, PAD, Arrythmias:Vtach; Risk Factors:Hypertension. Breast Cancer s/p mastectomy.  Sonographer:    Milbert Coulter Referring Phys: [id number] TIFFANY [place]   Sonographer Comments: Image acquisition challenging due to mastectomy. IMPRESSIONS   1. Endocardial borders are challenging. Left ventricular ejection fraction, by estimation, is 35 to 40%. The left ventricle has moderately decreased function. The left ventricle demonstrates global hypokinesis. Left ventricular diastolic parameters are indeterminate. 2. Right ventricular systolic function is mildly reduced. The right ventricular size is normal. There is mildly elevated pulmonary artery systolic pressure. The estimated right ventricular systolic pressure is 37.1 mmHg. 3. Left atrial size was severely dilated. 4. The mitral valve was not well visualized. Mild mitral valve regurgitation. 5. Aortic valve regurgitation is not visualized. Aortic valve sclerosis/calcification is present, without any evidence of aortic stenosis. 6. The inferior vena cava is normal in size with greater than 50% respiratory variability, suggesting right atrial pressure of 3 mmHg.  FINDINGS Left Ventricle: Endocardial borders are challenging. Left ventricular ejection fraction, by estimation, is 35 to 40%. The left ventricle has moderately decreased function. The left ventricle demonstrates global hypokinesis. The left ventricular internal cavity size was normal in size. There is no left ventricular hypertrophy. Left ventricular diastolic parameters are indeterminate.  Right Ventricle: The right ventricular size is normal. Right ventricular systolic function is mildly reduced. There is mildly elevated pulmonary artery systolic pressure. The tricuspid regurgitant velocity is 2.92 m/s, and with an assumed right atrial pressure of 3 mmHg, the  estimated right ventricular systolic pressure is 37.1 mmHg.  Left Atrium: Left atrial size was severely dilated.  Right Atrium: Right atrial size was normal in size.  Pericardium: There is no evidence of pericardial effusion.  Mitral Valve: The mitral valve was not well visualized. Mild mitral valve regurgitation.  Tricuspid Valve: Tricuspid valve regurgitation is mild.  Aortic Valve: Aortic valve regurgitation is not visualized. Aortic valve sclerosis/calcification is present, without any evidence of aortic stenosis. Aortic valve mean gradient measures 4.5 mmHg. Aortic valve peak gradient measures 7.6 mmHg. Aortic valve area, by VTI  measures 1.45 cm.  Pulmonic Valve: Pulmonic valve regurgitation is not visualized.  Aorta: The aortic root and ascending aorta are structurally normal, with no evidence of dilitation.  Venous: The inferior vena cava is normal in size with greater than 50% respiratory variability, suggesting right atrial pressure of 3 mmHg.  IAS/Shunts: No atrial level shunt detected by color flow Doppler.  Additional Comments: A device lead is visualized.   LEFT VENTRICLE PLAX 2D LVIDd:         5.95 cm     Diastology LVIDs:         4.80 cm     LV e' medial:    5.11 cm/s LV PW:         1.05 cm     LV E/e' medial:  20.0 LV IVS:        1.05 cm     LV e' lateral:   10.40 cm/s LVOT diam:     1.80 cm     LV E/e' lateral: 9.8 LV SV:         39 LV SV Index:   24 LVOT Area:     2.54 cm  LV Volumes (MOD) LV vol d, MOD A2C: 79.6 ml LV vol d, MOD A4C: 69.1 ml LV vol s, MOD A2C: 55.5 ml LV vol s, MOD A4C: 44.8 ml LV SV MOD A2C:     24.1 ml LV SV MOD A4C:     69.1 ml LV SV MOD BP:      25.7 ml  RIGHT VENTRICLE RV Basal diam:  2.75 cm RV Mid diam:    2.20 cm RV S prime:     9.25 cm/s TAPSE (M-mode): 1.3 cm  LEFT ATRIUM             Index        RIGHT ATRIUM           Index LA diam:        3.80 cm 2.32 cm/m   RA Area:     11.40 cm LA Vol (A2C):   71.2 ml 43.40  ml/m  RA Volume:   25.00 ml  15.24 ml/m LA Vol (A4C):   89.9 ml 54.80 ml/m LA Biplane Vol: 79.7 ml 48.58 ml/m AORTIC VALVE AV Area (Vmax):    1.57 cm AV Area (Vmean):   1.42 cm AV Area (VTI):     1.45 cm AV Vmax:           138.00 cm/s AV Vmean:          95.550 cm/s AV VTI:            0.272 m AV Peak Grad:      7.6 mmHg AV Mean Grad:      4.5 mmHg LVOT Vmax:         84.90 cm/s LVOT Vmean:        53.500 cm/s LVOT VTI:          0.155 m LVOT/AV VTI ratio: 0.57  AORTA Ao Root diam: 1.95 cm Ao Asc diam:  2.60 cm  MITRAL VALVE                  TRICUSPID VALVE MV Area (PHT): 3.89 cm       TR Peak grad:   34.1 mmHg MV Decel Time: 195 msec       TR Vmax:        292.00 cm/s MR Peak grad:    123.2 mmHg MR Mean  grad:    81.0 mmHg    SHUNTS MR Vmax:         555.00 cm/s  Systemic VTI:  0.16 m MR Vmean:        429.0 cm/s   Systemic Diam: 1.80 cm MR PISA:         2.26 cm MR PISA Eff ROA: 13 mm MR PISA Radius:  0.60 cm MV E velocity: 102.00 cm/s MV A velocity: 81.80 cm/s MV E/A ratio:  1.25  Halford Decamp signed by Carolan Clines Signature Date/Time: 03/25/2023/12:26:19 PM    Final              EKG:  EKG is not ordered today.   Recent Labs: 03/24/2023: TSH 1.974 04/11/2023: ALT 8; BUN 11; Creatinine 0.87; Hemoglobin 11.8; Magnesium 1.8; Platelet Count 207; Potassium 3.9; Sodium 138  Recent Lipid Panel    Component Value Date/Time   CHOL 181 03/24/2023 0447   TRIG 101 03/24/2023 0447   HDL 42 03/24/2023 0447   CHOLHDL 4.3 03/24/2023 0447   VLDL 20 03/24/2023 0447   LDLCALC 119 (H) 03/24/2023 0447     Home Medications   Current Meds  Medication Sig   alendronate (FOSAMAX) 70 MG tablet Take 1 tablet (70 mg total) by mouth once a week. Take with a full glass of water on an empty stomach.   furosemide (LASIX) 20 MG tablet Take 20 mg by mouth daily.   levETIRAcetam (KEPPRA) 750 MG tablet Take 1 tablet (750 mg total) by mouth 2 (two) times daily.    lidocaine-prilocaine (EMLA) cream Apply to affected area once (Patient taking differently: Apply 1 Application topically daily as needed (For pain).)   liothyronine (CYTOMEL) 25 MCG tablet Take 25 mcg by mouth daily.   metoprolol succinate (TOPROL XL) 25 MG 24 hr tablet Take 1 tablet (25 mg total) by mouth daily.   polyethylene glycol powder (GLYCOLAX/MIRALAX) 17 GM/SCOOP powder Take 17 g by mouth 2 (two) times daily.   potassium chloride SA (K-DUR,KLOR-CON) 20 MEQ tablet Take 20 mEq by mouth 2 (two) times daily.   PROLENSA 0.07 % SOLN Place 1 drop into the right eye 4 (four) times daily.   Propylene Glycol (SYSTANE BALANCE) 0.6 % SOLN Place 2 drops into the right eye in the morning and at bedtime.   sacubitril-valsartan (ENTRESTO) 49-51 MG Take 1 tablet by mouth 2 (two) times daily.   sacubitril-valsartan (ENTRESTO) 49-51 MG Take 1 tablet by mouth 2 (two) times daily.   TRUE METRIX BLOOD GLUCOSE TEST test strip SMARTSIG:Via Meter   TRUEplus Lancets 33G MISC    [DISCONTINUED] losartan (COZAAR) 50 MG tablet Take 50 mg by mouth daily.     Review of Systems      All other systems reviewed and are otherwise negative except as noted above.  Physical Exam    VS:  BP (!) 180/96   Pulse 78   Ht 5\' 2"  (1.575 m)   Wt 135 lb (61.2 kg)   BMI 24.69 kg/m  , BMI Body mass index is 24.69 kg/m.  Wt Readings from Last 3 Encounters:  04/12/23 135 lb (61.2 kg)  04/11/23 135 lb (61.2 kg)  04/04/23 133 lb 3.2 oz (60.4 kg)     GEN: Well nourished, well developed, in no acute distress. HEENT: normal. Neck: Supple, no JVD, carotid bruits, or masses. Cardiac: RRR, no murmurs, rubs, or gallops. No clubbing, cyanosis, edema.  Radials/PT 2+ and equal bilaterally.  Respiratory:  Respirations regular and unlabored,  clear to auscultation bilaterally. GI: Soft, nontender, nondistended. MS: No deformity or atrophy. Skin: Warm and dry, no rash. Neuro:  Strength and sensation are intact. Psych: Normal  affect.  Assessment & Plan    VT / Amiodarone therapy - Previously on Sotalol but had breakthrough NSVT per prior records. Will complete 14 days of Amiodarone 400mg  BID then reduce to 400mg  QD. Consider thyroid panel, liver panel at follow up.   CAD / HLD, LDL goal < 70 - Stable with no anginal symptoms. No indication for ischemic evaluation.  GDMT Toprol. Prior statin intolerance with myalgia.   [employer] single chamber ICD - Upcoming visit to establish with Dr. Elberta Fortis. No recurrent ICD firing since recent admission.   Metastatic breast cancer - Undergoing chemotherapy.   HFrEF - 2017 LVEF 40-45%. 03/25/23 LVEF 35-40%, LV global hypokinesis, ildly elevated PASP, LA severely dilated, mild MR. Euvolemic and well compensated on exam. GDMT Lasix 20mg  QD. Will stop Losartan and start Entresto 49-51mg  BID. Plan for CMP at follow up 04/26/23. Consider further titration of Entresto vs addition of Spironolactone at follow up.   Hypothyroidism - predates amiodarone. Continue to follow with PCP.   HTN - Elevated in clinic 180/96. She reports white coat hypertension with home BP 140/70. Discussed to monitor BP at home at least 2 hours after medications and sitting for 5-10 minutes. Stop Losartan, start Entresto as above.     Disposition: Follow up  in 2-3 months  with Chilton Si, MD or APP.  Signed, Alver Sorrow, NP 04/15/2023, 5:44 PM [facility]

## 2023-04-12 NOTE — Patient Instructions (Addendum)
Medication Instructions:  Your physician has recommended you make the following change in your medication:   STOP Losartan  START Entresto 49-51mg  twice daily  CHANGE Amiodarone to 400mg  once per day  *If you need a refill on your cardiac medications before your next appointment, please call your pharmacy*   Lab Work: We will ask your oncology team to check your thyroid and kidney function at your next labs 04/26/23.   Follow-Up: At [facility], you and your health needs are our priority.  As part of our continuing mission to provide you with exceptional heart care, we have created designated Provider Care Teams.  These Care Teams include your primary Cardiologist (physician) and Advanced Practice Providers (APPs -  Physician Assistants and Nurse Practitioners) who all work together to provide you with the care you need, when you need it.  We recommend signing up for the patient portal called "MyChart".  Sign up information is provided on this After Visit Summary.  MyChart is used to connect with patients for Virtual Visits (Telemedicine).  Patients are able to view lab/test results, encounter notes, upcoming appointments, etc.  Non-urgent messages can be sent to your provider as well.   To learn more about what you can do with MyChart, go to [email or web address].    Your next appointment:   As scheduled with Dr. Elberta Fortis  AND  In 2-3 months with Dr. Duke Salvia or Alver Sorrow, NP   Other Instructions  Heart Healthy Diet Recommendations: A low-salt diet is recommended. Meats should be grilled, baked, or boiled. Avoid fried foods. Focus on lean protein sources like fish or chicken with vegetables and fruits. The American Heart Association is a Chief Technology Officer!  American Heart Association Diet and Lifeystyle Recommendations  Recommend restricting to less than 2L (64 oz) of fluid per day.

## 2023-04-15 ENCOUNTER — Encounter (HOSPITAL_BASED_OUTPATIENT_CLINIC_OR_DEPARTMENT_OTHER): Payer: Self-pay | Admitting: Family

## 2023-04-16 ENCOUNTER — Other Ambulatory Visit: Payer: Self-pay | Admitting: *Deleted

## 2023-04-16 DIAGNOSIS — C50412 Malignant neoplasm of upper-outer quadrant of left female breast: Secondary | ICD-10-CM

## 2023-04-23 ENCOUNTER — Ambulatory Visit (HOSPITAL_COMMUNITY): Payer: Medicare HMO

## 2023-04-24 NOTE — Progress Notes (Unsigned)
Palliative Medicine [facility]  [phone] [phone]   Name: Colleen Lowery Date: 04/24/2023 MRN: [id number]  DOB: October 12, 1941  Patient Care Team: Dortha Kern, MD as PCP - General (Family Medicine) Chilton Si, MD as PCP - Cardiology (Cardiology) Regan Lemming, MD as PCP - Electrophysiology (Cardiology) Dorothy Puffer, MD as Consulting Physician (Radiation Oncology) Serena Croissant, MD as Consulting Physician (Hematology and Oncology) Harriette Bouillon, MD as Consulting Physician (General Surgery) Windell Norfolk, MD as Consulting Physician (Neurology)    INTERVAL HISTORY: Colleen Lowery is a 82 y.o. 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 Past medical history also includes seizures, diabetes, and HTN. Palliative ask to see for symptom management and goals of care.   SOCIAL HISTORY:     reports that she has been smoking cigarettes. She has a 12.50 pack-year smoking history. She has never used smokeless tobacco. She reports that she does not drink alcohol and does not use drugs.  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 , TAKING OTC MED FORGYMEMA   GERD (gastroesophageal reflux disease)    Headache    sinus   Heart failure, acute, systolic and diastolic (HCC) MARCH 2017 DX   History of colonic polyps    Hypertension    Hypothyroidism    per patient, takes OTC meds when temperature drops low   IBS (irritable bowel syndrome)    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    Seizures (HCC)    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  MEDICATIONS:  Current Outpatient Medications  Medication Sig Dispense Refill   alendronate (FOSAMAX) 70 MG tablet Take 1 tablet (70 mg total) by mouth once a week. Take with a full glass of water on an empty stomach. 12 tablet 3   amiodarone (PACERONE) 400 MG tablet Take 1 tablet (400 mg total) by mouth daily. 90 tablet 3   furosemide (LASIX) 20 MG tablet Take 20 mg by mouth daily.     levETIRAcetam (KEPPRA) 750 MG tablet Take 1 tablet (750 mg total) by mouth 2 (two) times daily. 60 tablet 0   lidocaine-prilocaine (EMLA) cream Apply to affected area once (Patient taking differently: Apply 1 Application topically daily as needed (For pain).) 30 g 3   liothyronine (CYTOMEL) 25 MCG tablet Take 25 mcg by mouth daily.     metoprolol succinate (TOPROL XL) 25 MG 24 hr tablet Take 1 tablet (25 mg total) by mouth daily. 30 tablet 1   polyethylene glycol powder (GLYCOLAX/MIRALAX) 17 GM/SCOOP powder Take 17 g by mouth 2 (two) times daily. 255 g 0   potassium chloride SA (K-DUR,KLOR-CON) 20 MEQ tablet  Take 20 mEq by mouth 2 (two) times daily.     PROLENSA 0.07 % SOLN Place 1 drop into the right eye 4 (four) times daily.     Propylene Glycol (SYSTANE BALANCE) 0.6 % SOLN Place 2 drops into the right eye in the morning and at bedtime.     sacubitril-valsartan (ENTRESTO) 49-51 MG Take 1 tablet by mouth 2 (two) times daily. 180 tablet 3   sacubitril-valsartan (ENTRESTO) 49-51 MG Take 1 tablet by mouth 2 (two) times daily. 56 tablet 0   TRUE METRIX BLOOD GLUCOSE TEST test strip SMARTSIG:Via Meter     TRUEplus Lancets 33G MISC      No current  facility-administered medications for this visit.    VITAL SIGNS: There were no vitals taken for this visit. There were no vitals filed for this visit.  Estimated body mass index is 24.69 kg/m as calculated from the following:   Height as of 04/12/23: 5\' 2"  (1.575 m).   Weight as of 04/12/23: 135 lb (61.2 kg).   PERFORMANCE STATUS (ECOG) : 1 - Symptomatic but completely ambulatory   Physical Exam General: NAD Cardiovascular: regular rate and rhythm Pulmonary: clear ant fields Abdomen: soft, nontender, + bowel sounds,  kin: no rashes Neurological: A/O x 4,   IMPRESSION:  I saw Colleen Lowery during her infusion. No acute distress. She is doing well overall. Is trying to stay as active as possible. She mentions a noticeable increase in her ADHD and inability to focus on one task. In the past was taking Ritalin. She is inquiring about restarting as she felt this really helped her however it has been several years since taking. I advised patient to speak further with her primary doctors in regards to this request.   Anxiety: Colleen Lowery continues to experience anxiety related to going out in public. She is afraid of coming into contact with strong odors, especially Purell hand sanitizer - that might trigger more seizures. Reports having obtained Valium and tried taking one tablet. The one tablet was "too strong." Tolerating valium as needed.   Emotional supports provided and encouragement to continue utilizing the Valium prior to appointments to help decrease anxiety.      2. Goals of Care  3/12- Colleen Lowery completed HCPOA paperwork listing her brother Colleen Lowery as her primary decision maker in the event she is unable to speak for herself.  Colleen Lowery remains clear in expressed wishes to continue to treat the the treatable allowing her every opportunity to continue to thrive. Taking life one day at a time. She is encouraged to notify the palliative care office for any symptoms that may arise,  such as pain.  We discussed her current illness and what it means in the larger context of Her on-going co-morbidities. Natural disease trajectory and expectations were discussed. We discussed the importance of continued conversation with family and their medical providers regarding overall plan of care and treatment options, ensuring decisions are within the context of the patients values and GOCs.  PLAN: Monitor use and efficacy of Valium in the management of anxiety. Encourage reporting of any symptoms that may arise. Continue goals of care conversations regarding patient's wishes in the context of her disease process. Palliative will plan to see patient back in 2-4 weeks in collaboration to other oncology appointments.    Patient expressed understanding and was in agreement with this plan. She also understands that She can call the clinic at any time with any questions, concerns, or complaints.   Any  controlled substances utilized were prescribed in the context of palliative care. PDMP has been reviewed.    Visit consisted of counseling and education dealing with the complex and emotionally intense issues of symptom management and palliative care in the setting of serious and potentially life-threatening illness.Greater than 50%  of this time was spent counseling and coordinating care related to the above assessment and plan.  Willette Alma, AGPCNP-BC  Palliative Medicine Team/[facility]  *Please note that this is a verbal dictation therefore any spelling or grammatical errors are due to the "Dragon Medical One" system interpretation.

## 2023-04-25 ENCOUNTER — Ambulatory Visit: Payer: Medicare HMO | Attending: Cardiology | Admitting: Cardiology

## 2023-04-25 ENCOUNTER — Encounter: Payer: Self-pay | Admitting: Cardiology

## 2023-04-25 ENCOUNTER — Other Ambulatory Visit: Payer: Self-pay | Admitting: Physician Assistant

## 2023-04-25 VITALS — BP 202/100 | HR 107 | Ht 62.0 in | Wt 132.6 lb

## 2023-04-25 DIAGNOSIS — Z79899 Other long term (current) drug therapy: Secondary | ICD-10-CM

## 2023-04-25 DIAGNOSIS — I5022 Chronic systolic (congestive) heart failure: Secondary | ICD-10-CM

## 2023-04-25 DIAGNOSIS — I10 Essential (primary) hypertension: Secondary | ICD-10-CM

## 2023-04-25 DIAGNOSIS — I472 Ventricular tachycardia, unspecified: Secondary | ICD-10-CM

## 2023-04-25 DIAGNOSIS — M7989 Other specified soft tissue disorders: Secondary | ICD-10-CM

## 2023-04-25 MED ORDER — METOPROLOL SUCCINATE ER 100 MG PO TB24: 100.0000 mg | ORAL_TABLET | Freq: Every day | ORAL | 6 refills | Status: DC

## 2023-04-25 NOTE — Progress Notes (Signed)
Electrophysiology Office Note:   Date:  04/25/2023  ID:  Colleen Lowery, DOB [birth year], MRN [id number]  Primary Cardiologist: Colleen Si, MD Electrophysiologist: Colleen Lowery Colleen Loa, MD      History of Present Illness:   Colleen Lowery is a 82 y.o. female with h/o chronic systolic heart failure, ventricular tachycardia, breast cancer seen today for post hospital follow up.    Admitted 03/23/2023 due to VT with ICD shocks.  She was started on amiodarone at the time.  Since discharge from hospital the patient reports doing well.  She has had no further episodes of ventricular tachycardia.  She has not noted any ICD shocks or therapy.  She continues to do her daily activities without restriction.  Despite this, she notes some swelling in her left upper extremity.  She has had her ICD moved from the left to the right due to breast cancer and then back to the left due to pocket pain and tingling in her fingers.  This did improve her discomfort.  Despite this, with her left mastectomy and new device on the left side, she has had swelling.  she denies chest pain, palpitations, dyspnea, PND, orthopnea, nausea, vomiting, dizziness, syncope, edema, weight gain, or early satiety.   Review of systems complete and found to be negative unless listed in HPI.   Studies Reviewed:    EKG is not ordered today. EKG from 03/23/23 reviewed which showed sinus rhythm, LVH  TTE 03/25/2023 personally reviewed  1. Endocardial borders are challenging. Left ventricular ejection  fraction, by estimation, is 35 to 40%. The left ventricle has moderately  decreased function. The left ventricle demonstrates global hypokinesis.  Left ventricular diastolic parameters are  indeterminate.   2. Right ventricular systolic function is mildly reduced. The right  ventricular size is normal. There is mildly elevated pulmonary artery  systolic pressure. The estimated right ventricular systolic pressure is  37.1 mmHg.   3. Left  atrial size was severely dilated.   4. The mitral valve was not well visualized. Mild mitral valve  regurgitation.   5. Aortic valve regurgitation is not visualized. Aortic valve  sclerosis/calcification is present, without any evidence of aortic  stenosis.   6. The inferior vena cava is normal in size with greater than 50%  respiratory variability, suggesting right atrial pressure of 3 mmHg.    Risk Assessment/Calculations:      Physical Exam:   VS:  BP (!) 202/100   Pulse (!) 107   Ht 5\' 2"  (1.575 m)   Wt 132 lb 9.6 oz (60.1 kg)   SpO2 98%   BMI 24.25 kg/m    Wt Readings from Last 3 Encounters:  04/25/23 132 lb 9.6 oz (60.1 kg)  04/12/23 135 lb (61.2 kg)  04/11/23 135 lb (61.2 kg)     GEN: Well nourished, well developed in no acute distress NECK: No JVD; No carotid bruits CARDIAC: Regular rate and rhythm, no murmurs, rubs, gallops RESPIRATORY:  Clear to auscultation without rales, wheezing or rhonchi  ABDOMEN: Soft, non-tender, non-distended EXTREMITIES:  No edema; No deformity   ASSESSMENT AND PLAN:    1.  Ventricular tachycardia: Hospital admission May 2024 for VT.  Was loaded on amiodarone.  She has had no further episodes.  Demetris Meinhardt continue with current management.  2.  Chronic systolic heart failure: Status post Medtronic ICD.  Device has been moved from the left to the right due to breast cancer.  He has been moved back to the left side due  to pocket pain and finger tingling.  Device functioning appropriately.  No changes.  She has elevated blood pressures.  Precious Segall increase Toprol-XL to 100 mg.  3.  Breast cancer: Currently undergoing chemotherapy.  Has liver mets.  4.  Hypertension: Blood pressure significantly elevated.  Increasing metoprolol.  5.  Left upper extremity swelling: Potentially due to history of breast cancer and left-sided ICD.  Gracey Tolle plan for ultrasound.  Follow up with EP APP in 6 months  Signed, Cambrie Sonnenfeld Colleen Loa, MD

## 2023-04-25 NOTE — Addendum Note (Signed)
Addended by: Baird Lyons on: 04/25/2023 11:51 AM   Modules accepted: Orders

## 2023-04-25 NOTE — Patient Instructions (Addendum)
Medication Instructions:  Your physician has recommended you make the following change in your medication:  INCREASE Metoprolol (Toprol) to 100  *If you need a refill on your cardiac medications before your next appointment, please call your pharmacy*   Lab Work: Today: CMET If you have labs (blood work) drawn today and your tests are completely normal, you will receive your results only by: MyChart Message (if you have MyChart) OR A paper copy in the mail If you have any lab test that is abnormal or we need to change your treatment, we will call you to review the results.   Testing/Procedures: Your physician has requested that you have a lower or upper extremity arterial duplex. This test is an ultrasound of the arteries in the legs or arms. It looks at arterial blood flow in the legs and arms. Allow one hour for Lower and Upper Arterial scans. There are no restrictions or special instructions   Follow-Up: At [facility], you and your health needs are our priority.  As part of our continuing mission to provide you with exceptional heart care, we have created designated Provider Care Teams.  These Care Teams include your primary Cardiologist (physician) and Advanced Practice Providers (APPs -  Physician Assistants and Nurse Practitioners) who all work together to provide you with the care you need, when you need it.   Keep your follow up with Gillian Shields, NP next month.  Your next appointment:   6 month(s)  The format for your next appointment:   In Person  Provider:   You will see one of the following Advanced Practice Providers on your designated Care Team:   Francis Dowse, [place] Casimiro Needle "Mardelle Matte" Lanna Poche, [place]   Thank you for choosing [facility]!!   Dory Horn, RN [phone]  Other Instructions

## 2023-04-26 ENCOUNTER — Telehealth (HOSPITAL_BASED_OUTPATIENT_CLINIC_OR_DEPARTMENT_OTHER): Payer: Self-pay

## 2023-04-26 ENCOUNTER — Inpatient Hospital Stay (HOSPITAL_BASED_OUTPATIENT_CLINIC_OR_DEPARTMENT_OTHER): Payer: Medicare HMO | Admitting: Nurse Practitioner

## 2023-04-26 ENCOUNTER — Other Ambulatory Visit: Payer: Self-pay

## 2023-04-26 ENCOUNTER — Encounter: Payer: Self-pay | Admitting: Nurse Practitioner

## 2023-04-26 ENCOUNTER — Encounter: Payer: Self-pay | Admitting: Hematology and Oncology

## 2023-04-26 ENCOUNTER — Inpatient Hospital Stay: Payer: Medicare HMO | Attending: Hematology and Oncology | Admitting: Physician Assistant

## 2023-04-26 ENCOUNTER — Inpatient Hospital Stay: Payer: Medicare HMO

## 2023-04-26 VITALS — BP 173/87 | HR 72 | Resp 16

## 2023-04-26 VITALS — BP 201/97 | HR 79 | Temp 98.1°F | Resp 16 | Wt 133.2 lb

## 2023-04-26 DIAGNOSIS — M7989 Other specified soft tissue disorders: Secondary | ICD-10-CM

## 2023-04-26 DIAGNOSIS — E039 Hypothyroidism, unspecified: Secondary | ICD-10-CM | POA: Diagnosis not present

## 2023-04-26 DIAGNOSIS — C50412 Malignant neoplasm of upper-outer quadrant of left female breast: Secondary | ICD-10-CM

## 2023-04-26 DIAGNOSIS — Z5112 Encounter for antineoplastic immunotherapy: Secondary | ICD-10-CM | POA: Diagnosis present

## 2023-04-26 DIAGNOSIS — Z515 Encounter for palliative care: Secondary | ICD-10-CM | POA: Diagnosis not present

## 2023-04-26 DIAGNOSIS — Z170 Estrogen receptor positive status [ER+]: Secondary | ICD-10-CM

## 2023-04-26 DIAGNOSIS — Z95828 Presence of other vascular implants and grafts: Secondary | ICD-10-CM

## 2023-04-26 DIAGNOSIS — F17210 Nicotine dependence, cigarettes, uncomplicated: Secondary | ICD-10-CM | POA: Diagnosis not present

## 2023-04-26 DIAGNOSIS — F419 Anxiety disorder, unspecified: Secondary | ICD-10-CM | POA: Diagnosis not present

## 2023-04-26 DIAGNOSIS — C787 Secondary malignant neoplasm of liver and intrahepatic bile duct: Secondary | ICD-10-CM | POA: Diagnosis not present

## 2023-04-26 DIAGNOSIS — R530 Neoplastic (malignant) related fatigue: Secondary | ICD-10-CM

## 2023-04-26 LAB — COMPREHENSIVE METABOLIC PANEL
ALT: 8 IU/L (ref 0–32)
AST: 15 IU/L (ref 0–40)
Albumin/Globulin Ratio: 1.8
Albumin: 4.2 g/dL (ref 3.8–4.8)
Alkaline Phosphatase: 110 IU/L (ref 44–121)
BUN/Creatinine Ratio: 12 (ref 12–28)
BUN: 11 mg/dL (ref 8–27)
Bilirubin Total: 0.4 mg/dL (ref 0.0–1.2)
CO2: 22 mmol/L (ref 20–29)
Calcium: 9.3 mg/dL (ref 8.7–10.3)
Chloride: 104 mmol/L (ref 96–106)
Creatinine, Ser: 0.93 mg/dL (ref 0.57–1.00)
Globulin, Total: 2.4 g/dL (ref 1.5–4.5)
Glucose: 150 mg/dL — ABNORMAL HIGH (ref 70–99)
Potassium: 3.8 mmol/L (ref 3.5–5.2)
Sodium: 141 mmol/L (ref 134–144)
Total Protein: 6.6 g/dL (ref 6.0–8.5)
eGFR: 63 mL/min/{1.73_m2} (ref >59)

## 2023-04-26 LAB — CMP (CANCER CENTER ONLY)
ALT: 9 U/L (ref 0–44)
AST: 14 U/L — ABNORMAL LOW (ref 15–41)
Albumin: 3.8 g/dL (ref 3.5–5.0)
Alkaline Phosphatase: 83 U/L (ref 38–126)
Anion gap: 8 (ref 5–15)
BUN: 13 mg/dL (ref 8–23)
CO2: 26 mmol/L (ref 22–32)
Calcium: 9.3 mg/dL (ref 8.9–10.3)
Chloride: 106 mmol/L (ref 98–111)
Creatinine: 0.79 mg/dL (ref 0.44–1.00)
GFR, Estimated: >60 mL/min (ref >60)
Glucose, Bld: 175 mg/dL — ABNORMAL HIGH (ref 70–99)
Potassium: 3.7 mmol/L (ref 3.5–5.1)
Sodium: 140 mmol/L (ref 135–145)
Total Bilirubin: 0.6 mg/dL (ref 0.3–1.2)
Total Protein: 7.1 g/dL (ref 6.5–8.1)

## 2023-04-26 LAB — CBC WITH DIFFERENTIAL (CANCER CENTER ONLY)
Abs Immature Granulocytes: 0.03 10*3/uL (ref 0.00–0.07)
Basophils Absolute: 0 10*3/uL (ref 0.0–0.1)
Basophils Relative: 1 %
Eosinophils Absolute: 0.1 10*3/uL (ref 0.0–0.5)
Eosinophils Relative: 2 %
HCT: 37.9 % (ref 36.0–46.0)
Hemoglobin: 12.2 g/dL (ref 12.0–15.0)
Immature Granulocytes: 1 %
Lymphocytes Relative: 18 %
Lymphs Abs: 1 10*3/uL (ref 0.7–4.0)
MCH: 29.6 pg (ref 26.0–34.0)
MCHC: 32.2 g/dL (ref 30.0–36.0)
MCV: 92 fL (ref 80.0–100.0)
Monocytes Absolute: 0.8 10*3/uL (ref 0.1–1.0)
Monocytes Relative: 15 %
Neutro Abs: 3.5 10*3/uL (ref 1.7–7.7)
Neutrophils Relative %: 63 %
Platelet Count: 243 10*3/uL (ref 150–400)
RBC: 4.12 MIL/uL (ref 3.87–5.11)
RDW: 16.4 % — ABNORMAL HIGH (ref 11.5–15.5)
WBC Count: 5.4 10*3/uL (ref 4.0–10.5)
nRBC: 0 % (ref 0.0–0.2)

## 2023-04-26 LAB — MAGNESIUM: Magnesium: 1.8 mg/dL (ref 1.7–2.4)

## 2023-04-26 LAB — PHOSPHORUS: Phosphorus: 3.5 mg/dL (ref 2.5–4.6)

## 2023-04-26 MED ORDER — ACETAMINOPHEN 325 MG PO TABS
650.0000 mg | ORAL_TABLET | Freq: Once | ORAL | Status: AC
  Administered 2023-04-26: 650 mg via ORAL
  Filled 2023-04-26: qty 2

## 2023-04-26 MED ORDER — FAMOTIDINE IN NACL 20-0.9 MG/50ML-% IV SOLN
20.0000 mg | Freq: Once | INTRAVENOUS | Status: AC
  Administered 2023-04-26: 20 mg via INTRAVENOUS
  Filled 2023-04-26: qty 50

## 2023-04-26 MED ORDER — SODIUM CHLORIDE 0.9 % IV SOLN
150.0000 mg | Freq: Once | INTRAVENOUS | Status: AC
  Administered 2023-04-26: 150 mg via INTRAVENOUS
  Filled 2023-04-26: qty 150

## 2023-04-26 MED ORDER — SODIUM CHLORIDE 0.9% FLUSH
10.0000 mL | Freq: Once | INTRAVENOUS | Status: AC
  Administered 2023-04-26: 10 mL

## 2023-04-26 MED ORDER — SODIUM CHLORIDE 0.9 % IV SOLN: Freq: Once | INTRAVENOUS | Status: AC

## 2023-04-26 MED ORDER — SODIUM CHLORIDE 0.9% FLUSH
10.0000 mL | INTRAVENOUS | Status: DC | PRN
  Administered 2023-04-26: 10 mL

## 2023-04-26 MED ORDER — HEPARIN SOD (PORK) LOCK FLUSH 100 UNIT/ML IV SOLN
500.0000 [IU] | Freq: Once | INTRAVENOUS | Status: AC | PRN
  Administered 2023-04-26: 500 [IU]

## 2023-04-26 MED ORDER — PALONOSETRON HCL INJECTION 0.25 MG/5ML
0.2500 mg | Freq: Once | INTRAVENOUS | Status: AC
  Administered 2023-04-26: 0.25 mg via INTRAVENOUS
  Filled 2023-04-26: qty 5

## 2023-04-26 MED ORDER — SODIUM CHLORIDE 0.9 % IV SOLN
10.0000 mg | Freq: Once | INTRAVENOUS | Status: AC
  Administered 2023-04-26: 10 mg via INTRAVENOUS
  Filled 2023-04-26: qty 10

## 2023-04-26 MED ORDER — CETIRIZINE HCL 10 MG/ML IV SOLN
10.0000 mg | Freq: Once | INTRAVENOUS | Status: AC
  Administered 2023-04-26: 10 mg via INTRAVENOUS
  Filled 2023-04-26: qty 1

## 2023-04-26 MED ORDER — SODIUM CHLORIDE 0.9 % IV SOLN
6.0000 mg/kg | Freq: Once | INTRAVENOUS | Status: AC
  Administered 2023-04-26: 360 mg via INTRAVENOUS
  Filled 2023-04-26: qty 36

## 2023-04-26 NOTE — Progress Notes (Signed)
Okay to proceed with treatment with phos level pending per Karena Addison PA.

## 2023-04-26 NOTE — Progress Notes (Signed)
[facility] [phone]) [phone]   [phone]  PROGRESS NOTE  Patient Care Team: Dortha Kern, MD as PCP - General (Family Medicine) Chilton Si, MD as PCP - Cardiology (Cardiology) Regan Lemming, MD as PCP - Electrophysiology (Cardiology) Dorothy Puffer, MD as Consulting Physician (Radiation Oncology) Serena Croissant, MD as Consulting Physician (Hematology and Oncology) Harriette Bouillon, MD as Consulting Physician (General Surgery) Windell Norfolk, MD as Consulting Physician (Neurology)  CHIEF COMPLAINTS/PURPOSE OF CONSULTATION:  Metastatic breast cancer  Oncology History  Malignant neoplasm of upper-outer quadrant of left breast in female, estrogen receptor positive (HCC)  03/22/2020 Initial Diagnosis   Diffuse left breast swelling. skin thickening and two areas of irregular hypoechogenicity in the 2-3 o'clock region, 4.9cm, and at least 4 abnormal lymph nodes  with cortical thickening. 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    10/28/2020 - 12/17/2020 Radiation Therapy   The patient initially received a dose of 50.4 Gy in 28 fractions to the left breast and supraclavicular region using whole-breast tangent fields. This was delivered using a 3-D conformal technique. The pt received a boost delivering an additional 10 Gy in 5 fractions using a electron boost with electrons. The total dose was 60.4 Gy.   11/2020 - 11/2027 Anti-estrogen oral therapy    Anastrozole   01/30/2022 Miscellaneous   Guardant360:ERBB2 (exon 20 insertion): Benefit from Enhertu, MSI high not detected   11/20/2022 Cancer Staging   Staging form: Breast, AJCC 8th Edition - Pathologic: Stage IV (pM1) - Signed by Loa Socks, NP on 11/20/2022   11/20/2022 -  Chemotherapy   Patient is on Treatment Plan : BREAST METASTATIC Sacituzumab govitecan-hziy Drinda Butts) D1,8 q21d      CURRENT TREATMENT: Drinda Butts, due for Cycle 8, Day 1.   INTERVAL HISTORY: [name] returns for a follow up prior to Cycle 8, Day 1 of Trodelvy treatment. She is unaccompanied for this visit.   Over the past week, Ms. Vavrek has noticed new onset left upper arm swelling without pain or erythema. She is able to ambulate her left arm without any issues. She is otherwise feeling well. She is tolerating her treatment without any new or concerning side effects. Her appetite and energy are stable. She denies nausea, vomiting or abdominal pain. She continues to have intermittent episodes of constipation that improves with miralax as needed. She denies easy bruising or signs of active bleeding. She denies any fevers, chills, sweats, shortness of breath, chest pain or cough. She has no other complaints. Rest of the ROS is below.   MEDICAL HISTORY:  Past Medical History:  Diagnosis Date   AICD (automatic cardioverter/defibrillator) present    Allergy    Arthritis    FINGERS   Cancer (HCC)    left breast   Cataract    right   Crohn disease (HCC)    Depression    Diabetes mellitus without complication (HCC)    TYPE 2 , TAKING OTC MED FORGYMEMA   GERD (gastroesophageal reflux  disease)    Headache    sinus   Heart failure, acute, systolic and diastolic (HCC) MARCH 2017 DX   History of colonic polyps    Hypertension    Hypothyroidism    per patient, takes OTC meds when temperature drops low   IBS (irritable bowel syndrome)    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   Seizures (HCC)     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, 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   Thyroid disease    AGE 50'S UNTIL  2016 OFF ALL THRYOID MEDS NOW    Wears dentures    Wears glasses     SURGICAL HISTORY: Past Surgical History:  Procedure Laterality Date   ABDOMINAL HYSTERECTOMY     PARTIAL   BREAST BIOPSY Left 04/01/2019   INVASIVE DUCTAL CARCINOMA   BREAST SURGERY     CARDIAC CATHETERIZATION  02/10/2016   Procedure: Right/Left Heart Cath and Coronary Angiography;  Surgeon: Marykay Lex, MD;  Location: [facility] INVASIVE CV LAB;  Service: Cardiovascular;;   CATARACT EXTRACTION W/ INTRAOCULAR LENS IMPLANT     left   COLONOSCOPY WITH PROPOFOL N/A 02/16/2017   Procedure: COLONOSCOPY WITH PROPOFOL;  Surgeon: Jeani Hawking, MD;  Location: [facility];  Service: Endoscopy;  Laterality: N/A;   COLONOSCOPY WITH PROPOFOL N/A 08/12/2021   Procedure: COLONOSCOPY WITH PROPOFOL;  Surgeon: Jeani Hawking, MD;  Location: [facility];  Service: Endoscopy;  Laterality: N/A;   EP IMPLANTABLE DEVICE N/A 02/10/2016   Procedure: ICD Implant;  Surgeon: Will Jorja Loa, MD;  Location: [facility] INVASIVE CV LAB;  Service: Cardiovascular;  Laterality: N/A;   ESOPHAGOGASTRODUODENOSCOPY (EGD) WITH PROPOFOL N/A 02/16/2017   Procedure: ESOPHAGOGASTRODUODENOSCOPY (EGD) WITH PROPOFOL;  Surgeon: Jeani Hawking, MD;  Location: [facility];  Service: Endoscopy;  Laterality: N/A;  reflux   IR IMAGING GUIDED PORT INSERTION  04/27/2020   IR IMAGING GUIDED PORT INSERTION  11/17/2022   MASTECTOMY Left    MASTECTOMY MODIFIED RADICAL Left 09/14/2020   Procedure: LEFT MASTECTOMY MODIFIED RADICAL;  Surgeon: Harriette Bouillon, MD;  Location: [facility] OR;  Service: General;  Laterality: Left;  PEC BLOCK   POLYPECTOMY  08/12/2021   Procedure: POLYPECTOMY;  Surgeon: Jeani Hawking, MD;  Location: [facility];  Service: Endoscopy;;   TONSILLECTOMY      SOCIAL HISTORY: Social History   Socioeconomic History   Marital status: Widowed    Spouse name: Not on file   Number of  children: Not on file   Years of education: Not on file   Highest education level: Not on file  Occupational History   Not on file  Tobacco Use   Smoking status: Every Day    Packs/day: 0.25    Years: 50.00    Additional pack years: 0.00    Total pack years: 12.50    Types: Cigarettes   Smokeless tobacco: Never   Tobacco comments:    3 puffs of each cigarette  Vaping Use   Vaping Use: Never used  Substance and Sexual Activity   Alcohol use: No   Drug use: No   Sexual activity: Not Currently  Other Topics Concern   Not on file  Social History Narrative   Not on file   Social Determinants of Health   Financial Resource Strain: Not on file  Food Insecurity: No Food Insecurity (03/24/2023)   Hunger Vital Sign    Worried About Running Out of Food in the Last Year: Never true    Ran Out of Food in the Last Year: Never true  Transportation Needs: No Transportation Needs (03/24/2023)   PRAPARE - Administrator, Civil Service (Medical): No    Lack of Transportation (Non-Medical): No  Physical Activity: Not on file  Stress: Not on file  Social Connections: Not on file  Intimate Partner Violence: Not At Risk (03/24/2023)   Humiliation, Afraid, Rape, and Kick questionnaire    Fear of Current or Ex-Partner: No    Emotionally Abused: No    Physically Abused: No    Sexually Abused: No    FAMILY HISTORY: Family History  Problem Relation Age of Onset   Diabetes Mother    Stroke Mother    Diabetes Brother    Diabetes Maternal Grandmother    Diabetes Paternal Grandfather    Heart failure Paternal Grandfather    Hypertension Brother    Heart attack Neg Hx     ALLERGIES:  is allergic to alcohol, contrast media [iodinated contrast media], iodine, atorvastatin, dust mite extract, latex, lisinopril, molds & smuts, and wound dressing adhesive.  MEDICATIONS:  Current Outpatient Medications  Medication Sig Dispense Refill   alendronate (FOSAMAX) 70 MG tablet Take 1  tablet (70 mg total) by mouth once a week. Take with a full glass of water on an empty stomach. 12 tablet 3   amiodarone (PACERONE) 400 MG tablet Take 1 tablet (400 mg total) by mouth daily. 90 tablet 3   furosemide (LASIX) 20 MG tablet Take 20 mg by mouth daily.     lidocaine-prilocaine (EMLA) cream Apply to affected area once (Patient taking differently: Apply 1 Application topically daily as needed (For pain).) 30 g 3   liothyronine (CYTOMEL) 25 MCG tablet Take 25 mcg by mouth daily.     metoprolol succinate (TOPROL-XL) 100 MG 24 hr tablet Take 1 tablet (100 mg total) by mouth daily. Take with or immediately following a meal. 30 tablet 6   polyethylene glycol powder (GLYCOLAX/MIRALAX) 17 GM/SCOOP powder Take 17 g by mouth 2 (two) times daily. 255 g 0   potassium chloride SA (K-DUR,KLOR-CON) 20 MEQ tablet Take 20 mEq by mouth 2 (two) times daily.     PROLENSA 0.07 % SOLN Place 1 drop into the right eye 4 (four) times daily.     Propylene Glycol (SYSTANE BALANCE) 0.6 % SOLN Place 2 drops into the right eye in the morning and at bedtime.     sacubitril-valsartan (ENTRESTO) 49-51 MG Take 1 tablet by mouth 2 (two) times daily. 180 tablet 3   TRUE METRIX BLOOD GLUCOSE TEST test strip SMARTSIG:Via Meter     TRUEplus Lancets 33G MISC      levETIRAcetam (KEPPRA) 750 MG tablet Take 1 tablet (750 mg total) by mouth 2 (two) times daily. 60 tablet 0   No current facility-administered medications for this visit.    REVIEW OF SYSTEMS:   Constitutional: ( - ) fevers, ( - )  chills , ( - ) night sweats Eyes: ( - ) blurriness of vision, ( - ) double vision, ( - ) watery eyes Ears, nose, mouth, throat, and face: ( - ) mucositis, ( - ) sore throat Respiratory: ( - ) cough, ( - ) dyspnea, ( - ) wheezes Cardiovascular: ( - ) palpitation, ( - ) chest discomfort, ( - ) lower extremity swelling Gastrointestinal:  ( - ) nausea, ( - ) heartburn, ( - ) change in bowel habits Skin: ( - ) abnormal skin  rashes Lymphatics: ( - ) new lymphadenopathy, ( - ) easy bruising Neurological: ( - )  numbness, ( - ) tingling, ( - ) new weaknesses Behavioral/Psych: ( - ) mood change, ( - ) new changes  All other systems were reviewed with the patient and are negative.  PHYSICAL EXAMINATION: ECOG PERFORMANCE STATUS: 1 - Symptomatic but completely ambulatory  Vitals:   04/26/23 1039  BP: (!) 201/97  Pulse: 79  Resp: 16  Temp: 98.1 F (36.7 C)  SpO2: 95%   Filed Weights   04/26/23 1039  Weight: 133 lb 3.2 oz (60.4 kg)    GENERAL: well appearing female in NAD  SKIN: skin color, texture, turgor are normal, no rashes or significant lesions EYES: conjunctiva are pink and non-injected, sclera clear LUNGS: clear to auscultation and percussion with normal breathing effort HEART: regular rate & rhythm and no murmurs and no lower extremity edema Musculoskeletal: no cyanosis of digits and no clubbing  PSYCH: alert & oriented x 3, fluent speech NEURO: no focal motor/sensory deficits  LABORATORY DATA:  I have reviewed the data as listed    Latest Ref Rng & Units 04/26/2023   10:14 AM 04/11/2023    9:25 AM 04/04/2023    9:30 AM  CBC  WBC 4.0 - 10.5 K/uL 5.4  5.2  4.9   Hemoglobin 12.0 - 15.0 g/dL 16.1  09.6  04.5   Hematocrit 36.0 - 46.0 % 37.9  35.8  35.6   Platelets 150 - 400 K/uL 243  207  246        Latest Ref Rng & Units 04/26/2023   10:14 AM 04/25/2023   11:51 AM 04/11/2023    9:25 AM  CMP  Glucose 70 - 99 mg/dL 409  811  914   BUN 8 - 23 mg/dL 13  11  11    Creatinine 0.44 - 1.00 mg/dL 7.82  9.56  2.13   Sodium 135 - 145 mmol/L 140  141  138   Potassium 3.5 - 5.1 mmol/L 3.7  3.8  3.9   Chloride 98 - 111 mmol/L 106  104  106   CO2 22 - 32 mmol/L 26  22  26    Calcium 8.9 - 10.3 mg/dL 9.3  9.3  8.7   Total Protein 6.5 - 8.1 g/dL 7.1  6.6  6.6   Total Bilirubin 0.3 - 1.2 mg/dL 0.6  0.4  0.4   Alkaline Phos 38 - 126 U/L 83  110  93   AST 15 - 41 U/L 14  15  11    ALT 0 - 44 U/L 9  8  8       ASSESSMENT & PLAN Colleen Lowery is a 82 y.o. female who presents to the clinic for a follow up for metastatic breast cancer.   #Metastatic breast cancer: --03/22/2020:diffuse left breast swelling. skin thickening and two areas of irregular hypoechogenicity in the 2-3 o'clock region, 4.9cm, and at least 4 abnormal lymph nodes  with cortical thickening.  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  Neoadjuvant chemotherapy with CMF 2.  09/14/2020:Left mastectomy ([name]): invasive and in situ ductal carcinoma, grade 2, 15cm, clear margins, with 14/14 lymph nodes positive for metastatic carcinoma with extracapsular extension.  ER 30%, PR 10%, HER-2 negative, Ki-67 15% 3.  Adjuvant radiation 10/28/2020-12/17/2020 4.  Followed by adjuvant antiestrogen therapy with anastrozole started 01/11/2021 5.  12/07/2021: Liver metastases: Biopsy consistent with metastatic  breast cancer ER 0%, PR 0%, HER2 1+ 6.  Capecitabine 1000 mg p.o. twice daily 2 weeks on 1 week off started 01/11/2022-10/31/2022.  Progression 7. Trodelvy started on 11/20/2022. PLAN: --Due for Cycle 8, Day 1 of Trodelvy today.  --Labs from today were reviewed and adequate for treatment. WBC 5.4, Hgb 12.2, Plt 243, Creatinine and LFTs normal. --Most recent CT scan from 03/23/2023 showed treatment response with decrease in size of liver metastases from 3.2 x 2.6 cm to 1.1 x 1.0 cm. --Proceed with treatment today without any dose modifications.  --Return for a follow up visit with labs before Cycle 9, Day 1.   #Hypertension: --Likely white coat syndrome since patient reports home BP runs 140/70-80's. --Initial BP was 201/97. Repeat was 174/84.   #Left upper extremity edema: --Present x 1 week --Scheduled for left upper extremity US tomorrow 04/27/2023.    No orders of the defined types were placed in this encounter.   All questions were answered. The patient knows to call the clinic with any problems, questions or concerns.  I have spent a total of 30 minutes minutes of face-to-face and non-face-to-face time, preparing to see the patient, performing a medically appropriate examination, counseling and educating the patient, documenting clinical information in the electronic health record, iand care coordination.   Georga Kaufmann, PA-C Department of Hematology/Oncology [facility] Phone: [phone]

## 2023-04-26 NOTE — Telephone Encounter (Addendum)
Left message for patient to call back    ----- Message from Alver Sorrow, NP sent at 04/26/2023  1:28 PM EDT ----- Normal kidneys, liver, electrolytes. Good result!   Dr. Elberta Fortis increased her Toprol at office visit yesterday as BP was markedly elevated. Please call to ensure she is taking Lasix, Toprol, Entresto as directed. Please obtain any home BP readings from her.

## 2023-04-27 ENCOUNTER — Other Ambulatory Visit: Payer: Self-pay | Admitting: Physician Assistant

## 2023-04-27 ENCOUNTER — Telehealth (HOSPITAL_BASED_OUTPATIENT_CLINIC_OR_DEPARTMENT_OTHER): Payer: Self-pay | Admitting: Family

## 2023-04-27 ENCOUNTER — Ambulatory Visit (HOSPITAL_COMMUNITY)
Admission: RE | Admit: 2023-04-27 | Discharge: 2023-04-27 | Disposition: A | Discharge status: Home / Self Care | Payer: Medicare HMO | Source: Ambulatory Visit | Attending: Cardiology | Admitting: Cardiology

## 2023-04-27 ENCOUNTER — Telehealth: Payer: Self-pay

## 2023-04-27 DIAGNOSIS — M7989 Other specified soft tissue disorders: Secondary | ICD-10-CM | POA: Diagnosis present

## 2023-04-27 MED ORDER — APIXABAN 5 MG PO TABS: ORAL_TABLET | ORAL | 0 refills | Status: DC

## 2023-04-27 NOTE — Telephone Encounter (Signed)
This was the only part of her BP log she can find at this current time!

## 2023-04-27 NOTE — Progress Notes (Signed)
I discussed LUE doppler US with Dr. Leonides Schanz. Recommend to start Eliquis starter pack. Cardiology team is aware.

## 2023-04-27 NOTE — Telephone Encounter (Signed)
BP not at goal <130/80. Recommend increase Entresto to 97-103mg  BID. Labs upcoming with oncology team, no need to order separate labs.   Alver Sorrow, NP

## 2023-04-27 NOTE — Telephone Encounter (Signed)
Patient returned call, transferred from call center,   Reviewed results with patient, she is still taking medications as prescribed. Asked her for her recent blood pressure logs, she could not find her log at this time. She will call back once she finds the log.        ----- Message from Alver Sorrow, NP sent at 04/26/2023  1:28 PM EDT ----- Normal kidneys, liver, electrolytes. Good result!    Dr. Elberta Fortis increased her Toprol at office visit yesterday as BP was markedly elevated. Please call to ensure she is taking Lasix, Toprol, Entresto as directed. Please obtain any home BP readings from her.

## 2023-04-27 NOTE — Telephone Encounter (Signed)
Left message for patient to call back     "BP not at goal <130/80. Recommend increase Entresto to 97-103mg  BID. Labs upcoming with oncology team, no need to order separate labs.   Alver Sorrow, NP "

## 2023-04-27 NOTE — Addendum Note (Signed)
Addended by: Marlene Lard on: 04/27/2023 10:17 AM   Modules accepted: Orders

## 2023-04-27 NOTE — Telephone Encounter (Signed)
Attempted to call pt regarding results of LUE doppler. Pt is positive for DVT to LUE. Per Georga Kaufmann, PA, she will send in eliquis starter.Left detailed message on identified VM. Routed results to Dr Duke Salvia.

## 2023-04-27 NOTE — Telephone Encounter (Signed)
Follow Up:    Patient is calling back with blood pressure readings.    04-27-23-    162/87 pulse 83 before medicine   157/89 pulse 76 after medicine

## 2023-04-28 LAB — THYROID PANEL WITH TSH
Free Thyroxine Index: 1.8 (ref 1.2–4.9)
T3 Uptake Ratio: 31 % (ref 24–39)
T4, Total: 5.9 ug/dL (ref 4.5–12.0)
TSH: 0.268 u[IU]/mL — ABNORMAL LOW (ref 0.450–4.500)

## 2023-04-30 ENCOUNTER — Telehealth (HOSPITAL_BASED_OUTPATIENT_CLINIC_OR_DEPARTMENT_OTHER): Payer: Self-pay | Admitting: Family

## 2023-04-30 ENCOUNTER — Telehealth: Payer: Self-pay

## 2023-04-30 MED ORDER — SACUBITRIL-VALSARTAN 97-103 MG PO TABS: 1.0000 | ORAL_TABLET | Freq: Two times a day (BID) | ORAL | 5 refills | Status: DC

## 2023-04-30 NOTE — Telephone Encounter (Signed)
Advised patient of lab results and sent copy to PCP

## 2023-04-30 NOTE — Telephone Encounter (Signed)
Spoke with patient and confirmed she is currently taking the [employer] of medication change, verbalized understanding

## 2023-04-30 NOTE — Telephone Encounter (Signed)
Follow Up:     Patient is retuning Kaila's call from Friday.[name]

## 2023-04-30 NOTE — Telephone Encounter (Signed)
Talked w/ pt about Eliquis/possible clot. Aware I will ensure 3 months of refills get sent in later this week. Patient verbalized understanding and agreeable to plan.

## 2023-04-30 NOTE — Telephone Encounter (Signed)
Per [employer] report:   Device alert for monitored VT Event occurred 6/12 @ 16:11, duration 59sec, HR 154.  Likely SVT similar morphology to presenting Presenting remains tachy ~150 - route to triage 2 NSVT, no therapy. LA,  (I suspect 6/12 event may be true VT due to differing morpholgy) longest event is 59 secs.) Updated Transmission today reveals a Presenting VS: 77 BPM Today's Intrinsic R wave is different morphology from below events occurring on 6/12.  Please review 6/12 events and if anything further for VT.  There is 1 noted 12 sec VT flagged event on 6/14. (R wave morphology appears same as 6/12 events).    Patient reports no symptoms other than concerns for her left arm swelling.  Reports having an ultrasound that Dr. Elberta Fortis ordered at last OV.  Says she was told she had a blood clot but no further instructions given.   She denies any SOB, chest pain, or dizziness.  Says R arm is cramping but no pain in Left arm.  Would like for Dr. Marjean Donna to follow up.    04/30/23 CURRENT EGM:      04/25/23 EGMS: CURRENT AND EPISODE (THESE MORPHOLOGIES MATCH AND AM WONDERING IF THEY ARE VT)?               04/27/23 EVENT:

## 2023-04-30 NOTE — Addendum Note (Signed)
Addended by: Regis Bill B on: 04/30/2023 05:54 PM   Modules accepted: Orders

## 2023-04-30 NOTE — Telephone Encounter (Signed)
-----   Message from Alver Sorrow, NP sent at 04/28/2023 11:00 AM EDT ----- Low thyroid hormone with normal T4/T3 indicative of subclinical hyperthyroidism. Recommend she follow up with her PCP regarding her Liothyronine to inquire if any dose adjustments are necessary.

## 2023-05-01 ENCOUNTER — Encounter: Payer: Self-pay | Admitting: Hematology and Oncology

## 2023-05-01 MED FILL — Fosaprepitant Dimeglumine For IV Infusion 150 MG (Base Eq): INTRAVENOUS | Qty: 5 | Status: AC

## 2023-05-01 MED FILL — Dexamethasone Sodium Phosphate Inj 100 MG/10ML: INTRAMUSCULAR | Qty: 1 | Status: AC

## 2023-05-01 NOTE — Telephone Encounter (Signed)
Received below message from pt cardiology team.  MD notified and verbalized understanding.

## 2023-05-01 NOTE — Telephone Encounter (Signed)
-----   Message from Colleen Jorja Loa, MD sent at 04/30/2023  3:22 PM EDT ----- A combination of her breast cancer with reduced flow down her axillary vein and the device implanted.  Agree with 3 months of anticoagulation. ----- Message ----- From: Chilton Si, MD Sent: 04/28/2023  12:59 PM EDT To: Regan Lemming, MD; De Blanch, LPN; #  Thank you.  I'm adding Dr. Elberta Fortis, her electrophysiologist to the conversation.  This is likely unrelated to her defibrillator.   Dr. Duke Salvia  ----- Message ----- From: De Blanch, LPN Sent: 07/27/7828   4:43 PM EDT To: Chilton Si, MD; [facility] 3  Hi Dr Duke Salvia,  [name] was positive for a DVT today in her LUE. We wanted to make you aware of this since she has a pacemaker. We weren't sure if there is any correlation. Georga Kaufmann, PA sent in a xarelto starter pack for the pt and she was advised to call your office. Please let us know if you have any questions or concerns.  Milinda Cave, LPN

## 2023-05-02 ENCOUNTER — Inpatient Hospital Stay: Payer: Medicare HMO

## 2023-05-02 ENCOUNTER — Other Ambulatory Visit: Payer: Self-pay

## 2023-05-02 VITALS — BP 192/100 | HR 67 | Temp 98.2°F | Resp 16 | Ht 62.0 in | Wt 134.0 lb

## 2023-05-02 DIAGNOSIS — C50412 Malignant neoplasm of upper-outer quadrant of left female breast: Secondary | ICD-10-CM

## 2023-05-02 DIAGNOSIS — Z5112 Encounter for antineoplastic immunotherapy: Secondary | ICD-10-CM | POA: Diagnosis not present

## 2023-05-02 DIAGNOSIS — Z95828 Presence of other vascular implants and grafts: Secondary | ICD-10-CM

## 2023-05-02 LAB — CMP (CANCER CENTER ONLY)
ALT: 12 U/L (ref 0–44)
AST: 14 U/L — ABNORMAL LOW (ref 15–41)
Albumin: 3.6 g/dL (ref 3.5–5.0)
Alkaline Phosphatase: 81 U/L (ref 38–126)
Anion gap: 8 (ref 5–15)
BUN: 13 mg/dL (ref 8–23)
CO2: 26 mmol/L (ref 22–32)
Calcium: 9.1 mg/dL (ref 8.9–10.3)
Chloride: 106 mmol/L (ref 98–111)
Creatinine: 0.95 mg/dL (ref 0.44–1.00)
GFR, Estimated: >60 mL/min (ref >60)
Glucose, Bld: 148 mg/dL — ABNORMAL HIGH (ref 70–99)
Potassium: 4 mmol/L (ref 3.5–5.1)
Sodium: 140 mmol/L (ref 135–145)
Total Bilirubin: 0.5 mg/dL (ref 0.3–1.2)
Total Protein: 6.2 g/dL — ABNORMAL LOW (ref 6.5–8.1)

## 2023-05-02 LAB — CBC WITH DIFFERENTIAL (CANCER CENTER ONLY)
Abs Immature Granulocytes: 0.06 10*3/uL (ref 0.00–0.07)
Basophils Absolute: 0.1 10*3/uL (ref 0.0–0.1)
Basophils Relative: 1 %
Eosinophils Absolute: 0.1 10*3/uL (ref 0.0–0.5)
Eosinophils Relative: 2 %
HCT: 37.7 % (ref 36.0–46.0)
Hemoglobin: 12.5 g/dL (ref 12.0–15.0)
Immature Granulocytes: 1 %
Lymphocytes Relative: 22 %
Lymphs Abs: 1.2 10*3/uL (ref 0.7–4.0)
MCH: 30.3 pg (ref 26.0–34.0)
MCHC: 33.2 g/dL (ref 30.0–36.0)
MCV: 91.3 fL (ref 80.0–100.0)
Monocytes Absolute: 0.7 10*3/uL (ref 0.1–1.0)
Monocytes Relative: 12 %
Neutro Abs: 3.2 10*3/uL (ref 1.7–7.7)
Neutrophils Relative %: 62 %
Platelet Count: 191 10*3/uL (ref 150–400)
RBC: 4.13 MIL/uL (ref 3.87–5.11)
RDW: 16.1 % — ABNORMAL HIGH (ref 11.5–15.5)
WBC Count: 5.3 10*3/uL (ref 4.0–10.5)
nRBC: 0 % (ref 0.0–0.2)

## 2023-05-02 LAB — PHOSPHORUS: Phosphorus: 3.9 mg/dL (ref 2.5–4.6)

## 2023-05-02 LAB — MAGNESIUM: Magnesium: 2 mg/dL (ref 1.7–2.4)

## 2023-05-02 MED ORDER — PALONOSETRON HCL INJECTION 0.25 MG/5ML
0.2500 mg | Freq: Once | INTRAVENOUS | Status: AC
  Administered 2023-05-02: 0.25 mg via INTRAVENOUS
  Filled 2023-05-02: qty 5

## 2023-05-02 MED ORDER — SODIUM CHLORIDE 0.9 % IV SOLN
150.0000 mg | Freq: Once | INTRAVENOUS | Status: AC
  Administered 2023-05-02: 150 mg via INTRAVENOUS
  Filled 2023-05-02: qty 150

## 2023-05-02 MED ORDER — SODIUM CHLORIDE 0.9 % IV SOLN
10.0000 mg | Freq: Once | INTRAVENOUS | Status: AC
  Administered 2023-05-02: 10 mg via INTRAVENOUS
  Filled 2023-05-02: qty 10

## 2023-05-02 MED ORDER — HEPARIN SOD (PORK) LOCK FLUSH 100 UNIT/ML IV SOLN
500.0000 [IU] | Freq: Once | INTRAVENOUS | Status: AC | PRN
  Administered 2023-05-02: 500 [IU]

## 2023-05-02 MED ORDER — ATROPINE SULFATE 1 MG/ML IV SOLN: 0.5000 mg | Freq: Once | INTRAVENOUS | Status: DC | PRN

## 2023-05-02 MED ORDER — FAMOTIDINE IN NACL 20-0.9 MG/50ML-% IV SOLN
20.0000 mg | Freq: Once | INTRAVENOUS | Status: AC
  Administered 2023-05-02: 20 mg via INTRAVENOUS
  Filled 2023-05-02: qty 50

## 2023-05-02 MED ORDER — CETIRIZINE HCL 10 MG/ML IV SOLN
10.0000 mg | Freq: Once | INTRAVENOUS | Status: AC
  Administered 2023-05-02: 10 mg via INTRAVENOUS
  Filled 2023-05-02: qty 1

## 2023-05-02 MED ORDER — SODIUM CHLORIDE 0.9% FLUSH
10.0000 mL | INTRAVENOUS | Status: DC | PRN
  Administered 2023-05-02: 10 mL

## 2023-05-02 MED ORDER — SODIUM CHLORIDE 0.9 % IV SOLN
6.0000 mg/kg | Freq: Once | INTRAVENOUS | Status: AC
  Administered 2023-05-02: 360 mg via INTRAVENOUS
  Filled 2023-05-02: qty 36

## 2023-05-02 MED ORDER — SODIUM CHLORIDE 0.9% FLUSH
10.0000 mL | Freq: Once | INTRAVENOUS | Status: AC
  Administered 2023-05-02: 10 mL

## 2023-05-02 MED ORDER — SODIUM CHLORIDE 0.9 % IV SOLN: Freq: Once | INTRAVENOUS | Status: AC

## 2023-05-02 MED ORDER — ACETAMINOPHEN 325 MG PO TABS
650.0000 mg | ORAL_TABLET | Freq: Once | ORAL | Status: AC
  Administered 2023-05-02: 650 mg via ORAL
  Filled 2023-05-02: qty 2

## 2023-05-02 NOTE — Patient Instructions (Signed)
[facility]  Discharge Instructions: Thank you for choosing [facility] to provide your oncology and hematology care.   If you have a lab appointment with the Cancer Center, please go directly to the Cancer Center and check in at the registration area.   Wear comfortable clothing and clothing appropriate for easy access to any Portacath or PICC line.   We strive to give you quality time with your provider. You may need to reschedule your appointment if you arrive late (15 or more minutes).  Arriving late affects you and other patients whose appointments are after yours.  Also, if you miss three or more appointments without notifying the office, you may be dismissed from the clinic at the provider's discretion.      For prescription refill requests, have your pharmacy contact our office and allow 72 hours for refills to be completed.    Today you received the following chemotherapy and/or immunotherapy agents: sacituzumab-govitecan-hziy      To help prevent nausea and vomiting after your treatment, we encourage you to take your nausea medication as directed.  BELOW ARE SYMPTOMS THAT SHOULD BE REPORTED IMMEDIATELY: *FEVER GREATER THAN 100.4 F (38 C) OR HIGHER *CHILLS OR SWEATING *NAUSEA AND VOMITING THAT IS NOT CONTROLLED WITH YOUR NAUSEA MEDICATION *UNUSUAL SHORTNESS OF BREATH *UNUSUAL BRUISING OR BLEEDING *URINARY PROBLEMS (pain or burning when urinating, or frequent urination) *BOWEL PROBLEMS (unusual diarrhea, constipation, pain near the anus) TENDERNESS IN MOUTH AND THROAT WITH OR WITHOUT PRESENCE OF ULCERS (sore throat, sores in mouth, or a toothache) UNUSUAL RASH, SWELLING OR PAIN  UNUSUAL VAGINAL DISCHARGE OR ITCHING   Items with * indicate a potential emergency and should be followed up as soon as possible or go to the Emergency Department if any problems should occur.  Please show the CHEMOTHERAPY ALERT CARD or IMMUNOTHERAPY  ALERT CARD at check-in to the Emergency Department and triage nurse.  Should you have questions after your visit or need to cancel or reschedule your appointment, please contact [facility]  Dept: [phone]  and follow the prompts.  Office hours are 8:00 a.m. to 4:30 p.m. Monday - Friday. Please note that voicemails left after 4:00 p.m. may not be returned until the following business day.  We are closed weekends and major holidays. You have access to a nurse at all times for urgent questions. Please call the main number to the clinic Dept: [phone] and follow the prompts.   For any non-urgent questions, you may also contact your provider using MyChart. We now offer e-Visits for anyone 18 and older to request care online for non-urgent symptoms. For details visit [email or web address].   Also download the MyChart app! Go to the app store, search "MyChart", open the app, select [facility], and log in with your MyChart username and password.   

## 2023-05-02 NOTE — Progress Notes (Signed)
Ok to treat with elevated blood pressure and pending phosphorus per Dr. Pamelia Hoit

## 2023-05-04 MED ORDER — APIXABAN 5 MG PO TABS: 5.0000 mg | ORAL_TABLET | Freq: Two times a day (BID) | ORAL | 3 refills | Status: DC

## 2023-05-04 NOTE — Telephone Encounter (Signed)
Left message to call back (Eliquis refills sent to pharmacy)

## 2023-05-04 NOTE — Addendum Note (Signed)
Addended by: Baird Lyons on: 05/04/2023 06:11 PM   Modules accepted: Orders

## 2023-05-07 ENCOUNTER — Encounter (HOSPITAL_BASED_OUTPATIENT_CLINIC_OR_DEPARTMENT_OTHER): Payer: Self-pay

## 2023-05-07 ENCOUNTER — Telehealth (HOSPITAL_BASED_OUTPATIENT_CLINIC_OR_DEPARTMENT_OTHER): Payer: Self-pay

## 2023-05-07 NOTE — Telephone Encounter (Signed)
Reviewed Friday note with patient, she is feeling good after losartan to entresto switch, she is still currently using up her sample supply and then will go get the higher dose Entresto from the pharmacy.

## 2023-05-10 ENCOUNTER — Telehealth: Payer: Self-pay | Admitting: Cardiology

## 2023-05-10 NOTE — Telephone Encounter (Signed)
Pt is requesting a callback regarding her device showing a orange flashing circle. She's concerned. Please advise

## 2023-05-10 NOTE — Telephone Encounter (Signed)
I spoke with the patient and her monitor is now working.

## 2023-05-15 ENCOUNTER — Encounter: Payer: Self-pay | Admitting: Adult Health

## 2023-05-15 ENCOUNTER — Inpatient Hospital Stay: Payer: Medicare HMO | Attending: Hematology and Oncology

## 2023-05-15 ENCOUNTER — Other Ambulatory Visit: Payer: Self-pay

## 2023-05-15 ENCOUNTER — Inpatient Hospital Stay (HOSPITAL_BASED_OUTPATIENT_CLINIC_OR_DEPARTMENT_OTHER): Payer: Medicare HMO | Admitting: Adult Health

## 2023-05-15 ENCOUNTER — Inpatient Hospital Stay: Payer: Medicare HMO

## 2023-05-15 ENCOUNTER — Encounter: Payer: Self-pay | Admitting: Hematology and Oncology

## 2023-05-15 VITALS — BP 169/94 | HR 87 | Temp 98.4°F | Resp 17 | Wt 129.8 lb

## 2023-05-15 VITALS — BP 148/81 | HR 66 | Resp 16

## 2023-05-15 DIAGNOSIS — F17210 Nicotine dependence, cigarettes, uncomplicated: Secondary | ICD-10-CM | POA: Insufficient documentation

## 2023-05-15 DIAGNOSIS — Z170 Estrogen receptor positive status [ER+]: Secondary | ICD-10-CM

## 2023-05-15 DIAGNOSIS — I82622 Acute embolism and thrombosis of deep veins of left upper extremity: Secondary | ICD-10-CM | POA: Diagnosis not present

## 2023-05-15 DIAGNOSIS — Z5112 Encounter for antineoplastic immunotherapy: Secondary | ICD-10-CM | POA: Insufficient documentation

## 2023-05-15 DIAGNOSIS — C787 Secondary malignant neoplasm of liver and intrahepatic bile duct: Secondary | ICD-10-CM | POA: Insufficient documentation

## 2023-05-15 DIAGNOSIS — C50412 Malignant neoplasm of upper-outer quadrant of left female breast: Secondary | ICD-10-CM | POA: Diagnosis present

## 2023-05-15 LAB — CMP (CANCER CENTER ONLY)
ALT: 8 U/L (ref 0–44)
AST: 14 U/L — ABNORMAL LOW (ref 15–41)
Albumin: 3.9 g/dL (ref 3.5–5.0)
Alkaline Phosphatase: 86 U/L (ref 38–126)
Anion gap: 9 (ref 5–15)
BUN: 9 mg/dL (ref 8–23)
CO2: 25 mmol/L (ref 22–32)
Calcium: 8.8 mg/dL — ABNORMAL LOW (ref 8.9–10.3)
Chloride: 105 mmol/L (ref 98–111)
Creatinine: 0.85 mg/dL (ref 0.44–1.00)
GFR, Estimated: >60 mL/min (ref >60)
Glucose, Bld: 168 mg/dL — ABNORMAL HIGH (ref 70–99)
Potassium: 3.8 mmol/L (ref 3.5–5.1)
Sodium: 139 mmol/L (ref 135–145)
Total Bilirubin: 0.6 mg/dL (ref 0.3–1.2)
Total Protein: 6.7 g/dL (ref 6.5–8.1)

## 2023-05-15 LAB — CBC WITH DIFFERENTIAL (CANCER CENTER ONLY)
Abs Immature Granulocytes: 0.03 10*3/uL (ref 0.00–0.07)
Basophils Absolute: 0 10*3/uL (ref 0.0–0.1)
Basophils Relative: 1 %
Eosinophils Absolute: 0 10*3/uL (ref 0.0–0.5)
Eosinophils Relative: 1 %
HCT: 40.8 % (ref 36.0–46.0)
Hemoglobin: 13.5 g/dL (ref 12.0–15.0)
Immature Granulocytes: 1 %
Lymphocytes Relative: 15 %
Lymphs Abs: 0.9 10*3/uL (ref 0.7–4.0)
MCH: 30 pg (ref 26.0–34.0)
MCHC: 33.1 g/dL (ref 30.0–36.0)
MCV: 90.7 fL (ref 80.0–100.0)
Monocytes Absolute: 0.8 10*3/uL (ref 0.1–1.0)
Monocytes Relative: 12 %
Neutro Abs: 4.3 10*3/uL (ref 1.7–7.7)
Neutrophils Relative %: 70 %
Platelet Count: 258 10*3/uL (ref 150–400)
RBC: 4.5 MIL/uL (ref 3.87–5.11)
RDW: 17 % — ABNORMAL HIGH (ref 11.5–15.5)
WBC Count: 6.1 10*3/uL (ref 4.0–10.5)
nRBC: 0 % (ref 0.0–0.2)

## 2023-05-15 LAB — MAGNESIUM: Magnesium: 1.8 mg/dL (ref 1.7–2.4)

## 2023-05-15 LAB — PHOSPHORUS: Phosphorus: 3.9 mg/dL (ref 2.5–4.6)

## 2023-05-15 MED ORDER — SODIUM CHLORIDE 0.9 % IV SOLN: Freq: Once | INTRAVENOUS | Status: AC

## 2023-05-15 MED ORDER — SODIUM CHLORIDE 0.9 % IV SOLN
10.0000 mg | Freq: Once | INTRAVENOUS | Status: AC
  Administered 2023-05-15: 10 mg via INTRAVENOUS
  Filled 2023-05-15: qty 10

## 2023-05-15 MED ORDER — SODIUM CHLORIDE 0.9% FLUSH
10.0000 mL | INTRAVENOUS | Status: DC | PRN
  Administered 2023-05-15: 10 mL

## 2023-05-15 MED ORDER — SODIUM CHLORIDE 0.9 % IV SOLN
6.0000 mg/kg | Freq: Once | INTRAVENOUS | Status: AC
  Administered 2023-05-15: 360 mg via INTRAVENOUS
  Filled 2023-05-15: qty 36

## 2023-05-15 MED ORDER — FAMOTIDINE IN NACL 20-0.9 MG/50ML-% IV SOLN
20.0000 mg | Freq: Once | INTRAVENOUS | Status: AC
  Administered 2023-05-15: 20 mg via INTRAVENOUS
  Filled 2023-05-15: qty 50

## 2023-05-15 MED ORDER — SODIUM CHLORIDE 0.9 % IV SOLN
150.0000 mg | Freq: Once | INTRAVENOUS | Status: AC
  Administered 2023-05-15: 150 mg via INTRAVENOUS
  Filled 2023-05-15: qty 150

## 2023-05-15 MED ORDER — PALONOSETRON HCL INJECTION 0.25 MG/5ML
0.2500 mg | Freq: Once | INTRAVENOUS | Status: AC
  Administered 2023-05-15: 0.25 mg via INTRAVENOUS
  Filled 2023-05-15: qty 5

## 2023-05-15 MED ORDER — HEPARIN SOD (PORK) LOCK FLUSH 100 UNIT/ML IV SOLN
500.0000 [IU] | Freq: Once | INTRAVENOUS | Status: AC | PRN
  Administered 2023-05-15: 500 [IU]

## 2023-05-15 MED ORDER — CETIRIZINE HCL 10 MG/ML IV SOLN
10.0000 mg | Freq: Once | INTRAVENOUS | Status: AC
  Administered 2023-05-15: 10 mg via INTRAVENOUS
  Filled 2023-05-15: qty 1

## 2023-05-15 MED ORDER — ACETAMINOPHEN 325 MG PO TABS
650.0000 mg | ORAL_TABLET | Freq: Once | ORAL | Status: AC
  Administered 2023-05-15: 650 mg via ORAL
  Filled 2023-05-15: qty 2

## 2023-05-15 NOTE — Progress Notes (Signed)
[facility] Cancer Follow up:    Dortha Kern, MD [address] [place] [address]   DIAGNOSIS:  Cancer Staging  Malignant neoplasm of upper-outer quadrant of left breast in female, estrogen receptor positive (HCC) Staging form: Breast, AJCC 8th Edition - Clinical stage from 04/07/2020: Stage IIIB (cT4b, cN1, cM0, G2, ER+, PR+, HER2-) - Signed by Serena Croissant, MD on 04/07/2020 Stage prefix: Initial diagnosis Histologic grading system: 3 grade system - Pathologic stage from 09/14/2020: No Stage Recommended (ypT3, pN3a, cM0, G2, ER+, PR+, HER2-) - Unsigned Stage prefix: Post-therapy Histologic grading system: 3 grade system - Pathologic: Stage IV (pM1) - Signed by Loa Socks, NP on 11/20/2022   SUMMARY OF ONCOLOGIC HISTORY: Oncology History  Malignant neoplasm of upper-outer quadrant of left breast in female, estrogen receptor positive (HCC)  03/22/2020 Initial Diagnosis   Diffuse left breast swelling. skin thickening and two areas of irregular hypoechogenicity in the 2-3 o'clock region, 4.9cm, and at least 4 abnormal lymph nodes  with cortical thickening. 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    10/28/2020 - 12/17/2020 Radiation Therapy   The patient initially received a dose of 50.4 Gy in 28 fractions to the left breast and supraclavicular region using whole-breast tangent fields. This was delivered using a 3-D conformal technique. The pt received a boost  delivering an additional 10 Gy in 5 fractions using a electron boost with electrons. The total dose was 60.4 Gy.   11/2020 - 11/2027 Anti-estrogen oral therapy   Anastrozole   01/30/2022 Miscellaneous   Guardant360:ERBB2 (exon 20 insertion): Benefit from Enhertu, MSI high not detected   11/20/2022 Cancer Staging   Staging form: Breast, AJCC 8th Edition - Pathologic: Stage IV (pM1) - Signed by Loa Socks, NP on 11/20/2022   11/20/2022 -  Chemotherapy   Patient is on Treatment Plan : BREAST METASTATIC Sacituzumab govitecan-hziy Drinda Butts) D1,8 q21d       CURRENT THERAPY: Drinda Butts  INTERVAL HISTORY: NORMA ZAKARIAN 82 y.o. female returns for follow-up prior to receiving [place]. She tolerates this well and denies any side effects from taking it other than mild fatigue. Most recent restaging scan occurred on Mar 23, 2023 demonstrating a decrease in the liver metastasis from 3.2 cm to 1 cm.    She turned 82 yesterday and says she hasn't quite wrapped her head around this milestone of a birthday.  Her biggest issue right now is her allergies for which she takes Careers adviser.  She also manages her constipation with miralax and aloe.  She had left arm swelling when she saw Karena Addison on 04/27/2023.  Doppler revealed left brachial DVT.  She was started on Eliquis and is tolerating it well.  She says her arm swelling has slightly improved.  She has never struggled with lymphedema in her left arm.   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   Acid reflux 02/09/2021   Acute gastritis 02/09/2021   Port-A-Cath in place 05/31/2020   Tobacco use  04/16/2020   Malignant neoplasm of upper-outer quadrant of left breast in female, estrogen receptor positive (HCC) 04/07/2020   Hyperlipidemia 10/11/2017   Presence of automatic (implantable) cardiac defibrillator 10/10/2016   Peripheral arterial disease (HCC) 06/14/2016   Upper airway cough syndrome 06/08/2016   CAD in native artery 05/31/2016   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/2016   VT (ventricular tachycardia) (HCC) 02/09/2016   Acute on chronic combined systolic and diastolic HF (heart failure) (HCC) 02/09/2016   Contrast media allergy 02/09/2016   Dyspnea 02/07/2016   Benign essential HTN 02/07/2016   Cigarette smoker 02/07/2016   Diabetes (HCC) 08/02/2014   Essential hypertension 08/02/2014    is allergic to alcohol, contrast media [iodinated contrast media], iodine, atorvastatin, dust mite extract, latex, lisinopril, molds & smuts, and wound dressing adhesive.  MEDICAL HISTORY: Past Medical History:  Diagnosis Date   AICD (automatic cardioverter/defibrillator) present    Allergy    Arthritis    FINGERS   Cancer (HCC)    left breast   Cataract    right   Crohn disease (HCC)    Depression    Diabetes mellitus without complication (HCC)    TYPE 2 , TAKING OTC MED FORGYMEMA   GERD (gastroesophageal reflux disease)    Headache    sinus   Heart failure, acute, systolic and diastolic (HCC) MARCH 2017 DX   History of colonic polyps    Hypertension    Hypothyroidism    per patient, takes OTC meds when temperature drops low   IBS (irritable bowel syndrome)    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   Seizures (HCC)    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, 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   Thyroid disease    AGE 19'S UNTIL  2016 OFF ALL THRYOID MEDS NOW    Wears dentures    Wears glasses     SURGICAL HISTORY: Past Surgical History:  Procedure Laterality Date   ABDOMINAL HYSTERECTOMY     PARTIAL   BREAST BIOPSY Left 04/01/2019   INVASIVE  DUCTAL CARCINOMA   BREAST SURGERY     CARDIAC CATHETERIZATION  02/10/2016   Procedure: Right/Left Heart Cath and Coronary Angiography;  Surgeon: Marykay Lex, MD;  Location: [facility] INVASIVE CV LAB;  Service: Cardiovascular;;   CATARACT EXTRACTION W/ INTRAOCULAR LENS IMPLANT     left   COLONOSCOPY WITH PROPOFOL N/A 02/16/2017   Procedure: COLONOSCOPY WITH PROPOFOL;  Surgeon: Jeani Hawking, MD;  Location: [facility] ENDOSCOPY;  Service: Endoscopy;  Laterality: N/A;   COLONOSCOPY WITH PROPOFOL N/A 08/12/2021   Procedure: COLONOSCOPY WITH PROPOFOL;  Surgeon: Jeani Hawking, MD;  Location: [facility] ENDOSCOPY;  Service: Endoscopy;  Laterality: N/A;   EP IMPLANTABLE DEVICE N/A 02/10/2016   Procedure: ICD Implant;  Surgeon: Will Jorja Loa, MD;  Location: [facility] INVASIVE CV LAB;  Service: Cardiovascular;  Laterality: N/A;   ESOPHAGOGASTRODUODENOSCOPY (EGD) WITH PROPOFOL N/A 02/16/2017   Procedure: ESOPHAGOGASTRODUODENOSCOPY (EGD) WITH PROPOFOL;  Surgeon: Jeani Hawking, MD;  Location: [facility] ENDOSCOPY;  Service: Endoscopy;  Laterality: N/A;  reflux   IR IMAGING GUIDED PORT INSERTION  04/27/2020   IR IMAGING GUIDED PORT INSERTION  11/17/2022  MASTECTOMY Left    MASTECTOMY MODIFIED RADICAL Left 09/14/2020   Procedure: LEFT MASTECTOMY MODIFIED RADICAL;  Surgeon: Harriette Bouillon, MD;  Location: [facility] OR;  Service: General;  Laterality: Left;  PEC BLOCK   POLYPECTOMY  08/12/2021   Procedure: POLYPECTOMY;  Surgeon: Jeani Hawking, MD;  Location: [facility] ENDOSCOPY;  Service: Endoscopy;;   TONSILLECTOMY      SOCIAL HISTORY: Social History   Socioeconomic History   Marital status: Widowed    Spouse name: Not on file   Number of children: Not on file   Years of education: Not on file   Highest education level: Not on file  Occupational History   Not on file  Tobacco Use   Smoking status: Every Day    Packs/day: 0.25    Years: 50.00    Additional pack years: 0.00    Total pack years: 12.50    Types: Cigarettes   Smokeless  tobacco: Never   Tobacco comments:    3 puffs of each cigarette  Vaping Use   Vaping Use: Never used  Substance and Sexual Activity   Alcohol use: No   Drug use: No   Sexual activity: Not Currently  Other Topics Concern   Not on file  Social History Narrative   Not on file   Social Determinants of Health   Financial Resource Strain: Not on file  Food Insecurity: No Food Insecurity (03/24/2023)   Hunger Vital Sign    Worried About Running Out of Food in the Last Year: Never true    Ran Out of Food in the Last Year: Never true  Transportation Needs: No Transportation Needs (03/24/2023)   PRAPARE - Administrator, Civil Service (Medical): No    Lack of Transportation (Non-Medical): No  Physical Activity: Not on file  Stress: Not on file  Social Connections: Not on file  Intimate Partner Violence: Not At Risk (03/24/2023)   Humiliation, Afraid, Rape, and Kick questionnaire    Fear of Current or Ex-Partner: No    Emotionally Abused: No    Physically Abused: No    Sexually Abused: No    FAMILY HISTORY: Family History  Problem Relation Age of Onset   Diabetes Mother    Stroke Mother    Diabetes Brother    Diabetes Maternal Grandmother    Diabetes Paternal Grandfather    Heart failure Paternal Grandfather    Hypertension Brother    Heart attack Neg Hx     Review of Systems  Constitutional:  Positive for fatigue. Negative for appetite change, chills, fever and unexpected weight change.  HENT:   Negative for hearing loss, lump/mass, mouth sores, sore throat and trouble swallowing.   Eyes:  Negative for eye problems and icterus.  Respiratory:  Negative for chest tightness, cough and shortness of breath.   Cardiovascular:  Negative for chest pain, leg swelling and palpitations.  Gastrointestinal:  Positive for constipation. Negative for abdominal distention, abdominal pain, diarrhea, nausea and vomiting.  Endocrine: Negative for hot flashes.  Genitourinary:   Negative for difficulty urinating.   Musculoskeletal:  Negative for arthralgias.  Skin:  Negative for itching and rash.  Neurological:  Negative for dizziness, extremity weakness, headaches and numbness.  Hematological:  Negative for adenopathy. Does not bruise/bleed easily.  Psychiatric/Behavioral:  Negative for depression. The patient is not nervous/anxious.       PHYSICAL EXAMINATION   Onc Performance Status - 05/15/23 0900       KPS SCALE   KPS % SCORE  Able to carry on normal activity, minor s/s of disease             Vitals:   05/15/23 0924 05/15/23 0931  BP: (!) 185/102 (!) 169/94  Pulse: 87   Resp: 17   Temp: 98.4 F (36.9 C)   SpO2: 98%     Physical Exam Constitutional:      General: She is not in acute distress.    Appearance: Normal appearance. She is not toxic-appearing.  HENT:     Head: Normocephalic and atraumatic.     Mouth/Throat:     Mouth: Mucous membranes are moist.     Pharynx: Oropharynx is clear. No oropharyngeal exudate or posterior oropharyngeal erythema.  Eyes:     General: No scleral icterus. Cardiovascular:     Rate and Rhythm: Normal rate and regular rhythm.     Pulses: Normal pulses.     Heart sounds: Normal heart sounds.  Pulmonary:     Effort: Pulmonary effort is normal.     Breath sounds: Normal breath sounds.  Abdominal:     General: Abdomen is flat. Bowel sounds are normal. There is no distension.     Palpations: Abdomen is soft.     Tenderness: There is no abdominal tenderness.  Musculoskeletal:        General: Swelling (Left arm swelling.) present.     Cervical back: Neck supple.  Lymphadenopathy:     Cervical: No cervical adenopathy.  Skin:    General: Skin is warm and dry.     Findings: No rash.  Neurological:     General: No focal deficit present.     Mental Status: She is alert.  Psychiatric:        Mood and Affect: Mood normal.        Behavior: Behavior normal.     LABORATORY DATA:  CBC    Component  Value Date/Time   WBC 6.1 05/15/2023 0908   WBC 5.0 03/25/2023 0240   RBC 4.50 05/15/2023 0908   HGB 13.5 05/15/2023 0908   HGB 13.9 06/20/2012 1055   HCT 40.8 05/15/2023 0908   HCT 40.1 06/20/2012 1055   PLT 258 05/15/2023 0908   PLT 308 06/20/2012 1055   MCV 90.7 05/15/2023 0908   MCV 93 06/20/2012 1055   MCH 30.0 05/15/2023 0908   MCHC 33.1 05/15/2023 0908   RDW 17.0 (H) 05/15/2023 0908   RDW 14.3 06/20/2012 1055   LYMPHSABS 0.9 05/15/2023 0908   LYMPHSABS 2.1 06/20/2012 1055   MONOABS 0.8 05/15/2023 0908   MONOABS 0.6 06/20/2012 1055   EOSABS 0.0 05/15/2023 0908   EOSABS 0.0 06/20/2012 1055   BASOSABS 0.0 05/15/2023 0908   BASOSABS 0.1 06/20/2012 1055    CMP     Component Value Date/Time   NA 139 05/15/2023 0908   NA 141 04/25/2023 1151   NA 141 06/20/2012 1055   K 3.8 05/15/2023 0908   K 3.6 06/20/2012 1055   CL 105 05/15/2023 0908   CL 109 (H) 06/20/2012 1055   CO2 25 05/15/2023 0908   CO2 25 06/20/2012 1055   GLUCOSE 168 (H) 05/15/2023 0908   GLUCOSE 173 (H) 06/20/2012 1055   BUN 9 05/15/2023 0908   BUN 11 04/25/2023 1151   BUN 12 06/20/2012 1055   CREATININE 0.85 05/15/2023 0908   CREATININE 0.92 05/09/2016 1053   CALCIUM 8.8 (L) 05/15/2023 0908   CALCIUM 9.0 06/20/2012 1055   PROT 6.7 05/15/2023 0908   PROT 6.6 04/25/2023 1151  ALBUMIN 3.9 05/15/2023 0908   ALBUMIN 4.2 04/25/2023 1151   AST 14 (L) 05/15/2023 0908   ALT 8 05/15/2023 0908   ALKPHOS 86 05/15/2023 0908   BILITOT 0.6 05/15/2023 0908   GFRNONAA >60 05/15/2023 0908   GFRNONAA >60 06/20/2012 1055   GFRAA >60 08/02/2020 0853   GFRAA >60 06/20/2012 1055        ASSESSMENT and THERAPY PLAN:   Malignant neoplasm of upper-outer quadrant of left breast in female, estrogen receptor positive (HCC) Colleen Lowery is an 82 year old woman with metastatic breast cancer currently on treatment with [place].  Metastatic breast cancer: She has no signs of breast cancer progression.  Her most recent  restaging occurred in May and showed improvement in disease.  She will be due for her restaging the week of August 5 and I have set the orders for that time.  Her labs today are stable and I reviewed them with her in detail.  She will proceed with her next cycle of chemotherapy with Drinda Butts today as ordered. Hypertension: She is normotensive at home.  Her elevated blood pressure at our clinic is likely due to whitecoat. Constipation: She will continue to manage with MiraLAX as she has.  She will return in 1 week for labs and treatment and in 3 weeks for labs, follow-up with Dr. Pamelia Hoit, treatment.  Acute deep vein thrombosis (DVT) of brachial vein of left upper extremity (HCC) Left upper extremity Doppler on April 27, 2023 demonstrated DVT of indeterminate age in the left brachial vein.  She will continue on apixaban 5 mg p.o. twice daily.  She has no issues with easy bruising or bleeding on this medication, is tolerating it well, and the left upper extremity swelling has improved.  Will defer to Dr. Pamelia Hoit about whether she can discontinue the apixaban however with her metastatic breast cancer she very well may need to continue it long-term.    All questions were answered. The patient knows to call the clinic with any problems, questions or concerns. We can certainly see the patient much sooner if necessary.  Total encounter time:30 minutes*in face-to-face visit time, chart review, lab review, care coordination, order entry, and documentation of the encounter time.    Lillard Anes, NP 05/15/23 10:16 AM Medical Oncology and Hematology [facility] [address] [place] [place], [place] [address] Tel. [phone]    Fax. [phone]  *Total Encounter Time as defined by the Centers for Medicare and Medicaid Services includes, in addition to the face-to-face time of a patient visit (documented in the note above) non-face-to-face time: obtaining and reviewing outside history, ordering  and reviewing medications, tests or procedures, care coordination (communications with other health care professionals or caregivers) and documentation in the medical record.

## 2023-05-15 NOTE — Assessment & Plan Note (Signed)
Left upper extremity Doppler on April 27, 2023 demonstrated DVT of indeterminate age in the left brachial vein.  She will continue on apixaban 5 mg p.o. twice daily.  She has no issues with easy bruising or bleeding on this medication, is tolerating it well, and the left upper extremity swelling has improved.  Will defer to Dr. Pamelia Hoit about whether she can discontinue the apixaban however with her metastatic breast cancer she very well may need to continue it long-term.

## 2023-05-15 NOTE — Assessment & Plan Note (Addendum)
Colleen Lowery is an 82 year old woman with metastatic breast cancer currently on treatment with [place].  Metastatic breast cancer: She has no signs of breast cancer progression.  Her most recent restaging occurred in May and showed improvement in disease.  She will be due for her restaging the week of August 5 and I have set the orders for that time.  Her labs today are stable and I reviewed them with her in detail.  She will proceed with her next cycle of chemotherapy with Colleen Lowery today as ordered. Hypertension: She is normotensive at home.  Her elevated blood pressure at our clinic is likely due to whitecoat. Constipation: She will continue to manage with MiraLAX as she has.  She will return in 1 week for labs and treatment and in 3 weeks for labs, follow-up with Dr. Pamelia Lowery, treatment.

## 2023-05-15 NOTE — Progress Notes (Signed)
Patient declined to stay for 30 minute post-observation.  Patient stable at discharge.

## 2023-05-15 NOTE — Patient Instructions (Signed)
[facility]   **You have been scheduled for the next available CT CAP exam on July 17th at 3 PM. Please arrive to [facility] main entrance for check-in. Do NOT eat or drink anything for 4 hours prior to your appointment. ** Discharge Instructions: Thank you for choosing [facility] to provide your oncology and hematology care.   If you have a lab appointment with the Cancer Center, please go directly to the Cancer Center and check in at the registration area.   Wear comfortable clothing and clothing appropriate for easy access to any Portacath or PICC line.   We strive to give you quality time with your provider. You may need to reschedule your appointment if you arrive late (15 or more minutes).  Arriving late affects you and other patients whose appointments are after yours.  Also, if you miss three or more appointments without notifying the office, you may be dismissed from the clinic at the provider's discretion.      For prescription refill requests, have your pharmacy contact our office and allow 72 hours for refills to be completed.    Today you received the following chemotherapy and/or immunotherapy agents: Sacituzumab govitecan Drinda Butts)      To help prevent nausea and vomiting after your treatment, we encourage you to take your nausea medication as directed.  BELOW ARE SYMPTOMS THAT SHOULD BE REPORTED IMMEDIATELY: *FEVER GREATER THAN 100.4 F (38 C) OR HIGHER *CHILLS OR SWEATING *NAUSEA AND VOMITING THAT IS NOT CONTROLLED WITH YOUR NAUSEA MEDICATION *UNUSUAL SHORTNESS OF BREATH *UNUSUAL BRUISING OR BLEEDING *URINARY PROBLEMS (pain or burning when urinating, or frequent urination) *BOWEL PROBLEMS (unusual diarrhea, constipation, pain near the anus) TENDERNESS IN MOUTH AND THROAT WITH OR WITHOUT PRESENCE OF ULCERS (sore throat, sores in mouth, or a toothache) UNUSUAL RASH, SWELLING OR PAIN  UNUSUAL VAGINAL DISCHARGE OR  ITCHING   Items with * indicate a potential emergency and should be followed up as soon as possible or go to the Emergency Department if any problems should occur.  Please show the CHEMOTHERAPY ALERT CARD or IMMUNOTHERAPY ALERT CARD at check-in to the Emergency Department and triage nurse.  Should you have questions after your visit or need to cancel or reschedule your appointment, please contact [facility]  Dept: [phone]  and follow the prompts.  Office hours are 8:00 a.m. to 4:30 p.m. Monday - Friday. Please note that voicemails left after 4:00 p.m. may not be returned until the following business day.  We are closed weekends and major holidays. You have access to a nurse at all times for urgent questions. Please call the main number to the clinic Dept: [phone] and follow the prompts.   For any non-urgent questions, you may also contact your provider using MyChart. We now offer e-Visits for anyone 104 and older to request care online for non-urgent symptoms. For details visit [email or web address].   Also download the MyChart app! Go to the app store, search "MyChart", open the app, select [facility], and log in with your MyChart username and password.

## 2023-05-15 NOTE — Progress Notes (Signed)
Per Lillard Anes, NP - okay to proceed with treatment with elevated BP of 179/83.

## 2023-05-18 ENCOUNTER — Telehealth: Payer: Self-pay

## 2023-05-18 NOTE — Telephone Encounter (Signed)
Following alert received from [employer] received for one NSVT that was 155 bpm for 73 seconds, sent to triage.   Questionable if this is true VT or SVT when compared to previous EGM's.   Attempted to call patient. No answer, [facility].

## 2023-05-21 NOTE — Telephone Encounter (Signed)
Patient called and denies any symptoms during event logged. Pt reports its possible missed her medications around that time. Stressed importance of compliance with medications. Pt voiced understanding.  Advised patient we will continue to monitor at this time. Pt aware to call if any symptoms occur.

## 2023-05-22 MED FILL — Dexamethasone Sodium Phosphate Inj 100 MG/10ML: INTRAMUSCULAR | Qty: 1 | Status: AC

## 2023-05-22 MED FILL — Fosaprepitant Dimeglumine For IV Infusion 150 MG (Base Eq): INTRAVENOUS | Qty: 5 | Status: AC

## 2023-05-22 NOTE — Progress Notes (Unsigned)
Palliative Medicine [facility]  Telephone:(336) [phone] Fax:(336) [phone]   Name: Colleen Lowery Date: 05/22/2023 MRN: [id number]  DOB: 02/13/1941  Patient Care Team: Dortha Kern, MD as PCP - General (Family Medicine) Chilton Si, MD as PCP - Cardiology (Cardiology) Regan Lemming, MD as PCP - Electrophysiology (Cardiology) Dorothy Puffer, MD as Consulting Physician (Radiation Oncology) Serena Croissant, MD as Consulting Physician (Hematology and Oncology) Harriette Bouillon, MD as Consulting Physician (General Surgery) Windell Norfolk, MD as Consulting Physician (Neurology)    INTERVAL HISTORY: Colleen Lowery is a 82 y.o. 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 Past medical history also includes seizures, diabetes, and HTN. Palliative ask to see for symptom management and goals of care.   SOCIAL HISTORY:     reports that she has been smoking cigarettes. She has a 12.50 pack-year smoking history. She has never used smokeless tobacco. She reports that she does not drink alcohol and does not use drugs.  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 , TAKING OTC MED FORGYMEMA   GERD (gastroesophageal reflux disease)    Headache    sinus   Heart failure, acute, systolic and diastolic (HCC) MARCH 2017 DX   History of colonic polyps    Hypertension    Hypothyroidism    per patient, takes OTC meds when temperature drops low   IBS (irritable bowel syndrome)    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  MEDICATIONS:  Current Outpatient Medications  Medication Sig Dispense Refill   alendronate (FOSAMAX) 70 MG tablet Take 1 tablet (70 mg total) by mouth once a week. Take with a full glass of water on an empty stomach. 12 tablet 3   amiodarone (PACERONE) 400 MG tablet Take 1 tablet (400 mg total) by mouth daily. 90 tablet 3   apixaban (ELIQUIS) 5 MG TABS tablet Take 1 tablet (5 mg total) by mouth 2 (two) times daily. For 3 months 60 tablet 3   furosemide (LASIX) 20 MG tablet Take 20 mg by mouth daily.     levETIRAcetam (KEPPRA) 750 MG tablet Take 1 tablet (750 mg total) by mouth 2 (two) times daily. 60 tablet 0   lidocaine-prilocaine (EMLA) cream Apply to affected area once (Patient taking differently: Apply 1 Application topically daily as needed (For pain).) 30 g 3   liothyronine (CYTOMEL) 25 MCG tablet Take 25 mcg by mouth daily.     metoprolol succinate (TOPROL-XL) 100 MG 24 hr tablet Take 1 tablet (100 mg total) by mouth daily. Take with or immediately following a meal. 30  tablet 6   polyethylene glycol powder (GLYCOLAX/MIRALAX) 17 GM/SCOOP powder Take 17 g by mouth 2 (two) times daily. 255 g 0   potassium chloride SA (K-DUR,KLOR-CON) 20 MEQ tablet Take 20 mEq by mouth 2 (two) times daily.     PROLENSA 0.07 % SOLN Place 1 drop into the right eye 4 (four) times daily.     Propylene Glycol (SYSTANE BALANCE) 0.6 % SOLN Place 2 drops into the right eye in the morning and at bedtime.     sacubitril-valsartan (ENTRESTO) 97-103 MG Take 1 tablet by mouth 2 (two) times daily. 60 tablet 5   TRUE METRIX BLOOD GLUCOSE TEST test strip SMARTSIG:Via Meter      TRUEplus Lancets 33G MISC      No current facility-administered medications for this visit.    VITAL SIGNS: There were no vitals taken for this visit. There were no vitals filed for this visit.  Estimated body mass index is 23.74 kg/m as calculated from the following:   Height as of 05/02/23: 5\' 2"  (1.575 m).   Weight as of 05/15/23: 129 lb 12.8 oz (58.9 kg).   PERFORMANCE STATUS (ECOG) : 1 - Symptomatic but completely ambulatory   Physical Exam General: NAD Cardiovascular: regular rate and rhythm Pulmonary: clear ant fields Abdomen: soft, nontender, + bowel sounds,  kin: no rashes Neurological: A/O x 4,   IMPRESSION:  I saw Colleen Lowery during her infusion. No acute distress. Denies nausea, vomiting, constipation, or diarrhea. Reports overall she is doing well. Trying to remain active.   No symptom needs at this time.   Anxiety: ColleenLowery continues to experience anxiety related to going out in public. She is afraid of coming into contact with strong odors, especially Purell hand sanitizer - that might trigger more seizures. Reports having obtained Valium and tried taking one tablet. The one tablet was "too strong." Tolerating valium as needed.   Emotional supports provided and encouragement to continue utilizing the Valium prior to appointments to help decrease anxiety.      2. Goals of Care  3/12- Colleen Lowery completed HCPOA paperwork listing her brother Juliene Pina as her primary decision maker in the event she is unable to speak for herself.  Colleen Lowery remains clear in expressed wishes to continue to treat the the treatable allowing her every opportunity to continue to thrive. Taking life one day at a time. She is encouraged to notify the palliative care office for any symptoms that may arise, such as pain.  We discussed her current illness and what it means in the larger context of Her on-going co-morbidities. Natural disease trajectory and expectations were discussed. We  discussed the importance of continued conversation with family and their medical providers regarding overall plan of care and treatment options, ensuring decisions are within the context of the patients values and GOCs.  PLAN: Monitor use and efficacy of Valium in the management of anxiety. Encourage reporting of any symptoms that may arise. Continue goals of care conversations regarding patient's wishes in the context of her disease process. Palliative will plan to see patient back in 2-4 weeks in collaboration to other oncology appointments.    Patient expressed understanding and was in agreement with this plan. She also understands that She can call the clinic at any time with any questions, concerns, or complaints.   Any controlled substances utilized were prescribed in the context of palliative care. PDMP has been reviewed.    Visit consisted of counseling and education dealing with the complex and  emotionally intense issues of symptom management and palliative care in the setting of serious and potentially life-threatening illness.Greater than 50%  of this time was spent counseling and coordinating care related to the above assessment and plan.  Willette Alma, AGPCNP-BC  Palliative Medicine Team/[facility]  *Please note that this is a verbal dictation therefore any spelling or grammatical errors are due to the "Dragon Medical One" system interpretation.

## 2023-05-23 ENCOUNTER — Inpatient Hospital Stay: Payer: Medicare HMO

## 2023-05-23 ENCOUNTER — Inpatient Hospital Stay (HOSPITAL_BASED_OUTPATIENT_CLINIC_OR_DEPARTMENT_OTHER): Payer: Medicare HMO | Admitting: Nurse Practitioner

## 2023-05-23 ENCOUNTER — Encounter: Payer: Self-pay | Admitting: Nurse Practitioner

## 2023-05-23 ENCOUNTER — Other Ambulatory Visit: Payer: Self-pay

## 2023-05-23 VITALS — BP 152/93 | HR 61 | Temp 97.6°F | Resp 18 | Ht 62.0 in | Wt 132.0 lb

## 2023-05-23 DIAGNOSIS — Z515 Encounter for palliative care: Secondary | ICD-10-CM

## 2023-05-23 DIAGNOSIS — F419 Anxiety disorder, unspecified: Secondary | ICD-10-CM | POA: Diagnosis not present

## 2023-05-23 DIAGNOSIS — Z170 Estrogen receptor positive status [ER+]: Secondary | ICD-10-CM

## 2023-05-23 DIAGNOSIS — C787 Secondary malignant neoplasm of liver and intrahepatic bile duct: Secondary | ICD-10-CM | POA: Diagnosis not present

## 2023-05-23 DIAGNOSIS — C50412 Malignant neoplasm of upper-outer quadrant of left female breast: Secondary | ICD-10-CM

## 2023-05-23 DIAGNOSIS — Z5112 Encounter for antineoplastic immunotherapy: Secondary | ICD-10-CM | POA: Diagnosis not present

## 2023-05-23 DIAGNOSIS — Z95828 Presence of other vascular implants and grafts: Secondary | ICD-10-CM

## 2023-05-23 LAB — CBC WITH DIFFERENTIAL (CANCER CENTER ONLY)
Abs Immature Granulocytes: 0.06 10*3/uL (ref 0.00–0.07)
Basophils Absolute: 0 10*3/uL (ref 0.0–0.1)
Basophils Relative: 1 %
Eosinophils Absolute: 0.1 10*3/uL (ref 0.0–0.5)
Eosinophils Relative: 3 %
HCT: 40.4 % (ref 36.0–46.0)
Hemoglobin: 13.1 g/dL (ref 12.0–15.0)
Immature Granulocytes: 1 %
Lymphocytes Relative: 30 %
Lymphs Abs: 1.3 10*3/uL (ref 0.7–4.0)
MCH: 29.7 pg (ref 26.0–34.0)
MCHC: 32.4 g/dL (ref 30.0–36.0)
MCV: 91.6 fL (ref 80.0–100.0)
Monocytes Absolute: 0.7 10*3/uL (ref 0.1–1.0)
Monocytes Relative: 16 %
Neutro Abs: 2.1 10*3/uL (ref 1.7–7.7)
Neutrophils Relative %: 49 %
Platelet Count: 237 10*3/uL (ref 150–400)
RBC: 4.41 MIL/uL (ref 3.87–5.11)
RDW: 16.9 % — ABNORMAL HIGH (ref 11.5–15.5)
WBC Count: 4.2 10*3/uL (ref 4.0–10.5)
nRBC: 0 % (ref 0.0–0.2)

## 2023-05-23 LAB — CMP (CANCER CENTER ONLY)
ALT: 10 U/L (ref 0–44)
AST: 13 U/L — ABNORMAL LOW (ref 15–41)
Albumin: 3.7 g/dL (ref 3.5–5.0)
Alkaline Phosphatase: 73 U/L (ref 38–126)
Anion gap: 6 (ref 5–15)
BUN: 18 mg/dL (ref 8–23)
CO2: 25 mmol/L (ref 22–32)
Calcium: 9.2 mg/dL (ref 8.9–10.3)
Chloride: 106 mmol/L (ref 98–111)
Creatinine: 1.04 mg/dL — ABNORMAL HIGH (ref 0.44–1.00)
GFR, Estimated: 54 mL/min — ABNORMAL LOW (ref >60)
Glucose, Bld: 153 mg/dL — ABNORMAL HIGH (ref 70–99)
Potassium: 4.1 mmol/L (ref 3.5–5.1)
Sodium: 137 mmol/L (ref 135–145)
Total Bilirubin: 0.4 mg/dL (ref 0.3–1.2)
Total Protein: 6.7 g/dL (ref 6.5–8.1)

## 2023-05-23 LAB — PHOSPHORUS: Phosphorus: 4.3 mg/dL (ref 2.5–4.6)

## 2023-05-23 LAB — MAGNESIUM: Magnesium: 1.9 mg/dL (ref 1.7–2.4)

## 2023-05-23 MED ORDER — CETIRIZINE HCL 10 MG/ML IV SOLN
10.0000 mg | Freq: Once | INTRAVENOUS | Status: AC
  Administered 2023-05-23: 10 mg via INTRAVENOUS
  Filled 2023-05-23: qty 1

## 2023-05-23 MED ORDER — HEPARIN SOD (PORK) LOCK FLUSH 100 UNIT/ML IV SOLN
500.0000 [IU] | Freq: Once | INTRAVENOUS | Status: AC | PRN
  Administered 2023-05-23: 500 [IU]

## 2023-05-23 MED ORDER — SODIUM CHLORIDE 0.9 % IV SOLN
150.0000 mg | Freq: Once | INTRAVENOUS | Status: AC
  Administered 2023-05-23: 150 mg via INTRAVENOUS
  Filled 2023-05-23: qty 150

## 2023-05-23 MED ORDER — SODIUM CHLORIDE 0.9% FLUSH
10.0000 mL | INTRAVENOUS | Status: DC | PRN
  Administered 2023-05-23: 10 mL

## 2023-05-23 MED ORDER — SODIUM CHLORIDE 0.9% FLUSH
10.0000 mL | Freq: Once | INTRAVENOUS | Status: AC
  Administered 2023-05-23: 10 mL

## 2023-05-23 MED ORDER — SODIUM CHLORIDE 0.9 % IV SOLN: Freq: Once | INTRAVENOUS | Status: AC

## 2023-05-23 MED ORDER — ACETAMINOPHEN 325 MG PO TABS
650.0000 mg | ORAL_TABLET | Freq: Once | ORAL | Status: AC
  Administered 2023-05-23: 650 mg via ORAL
  Filled 2023-05-23: qty 2

## 2023-05-23 MED ORDER — ATROPINE SULFATE 1 MG/ML IV SOLN: 0.5000 mg | Freq: Once | INTRAVENOUS | Status: DC | PRN

## 2023-05-23 MED ORDER — SODIUM CHLORIDE 0.9 % IV SOLN
10.0000 mg | Freq: Once | INTRAVENOUS | Status: AC
  Administered 2023-05-23: 10 mg via INTRAVENOUS
  Filled 2023-05-23: qty 10

## 2023-05-23 MED ORDER — PALONOSETRON HCL INJECTION 0.25 MG/5ML
0.2500 mg | Freq: Once | INTRAVENOUS | Status: AC
  Administered 2023-05-23: 0.25 mg via INTRAVENOUS
  Filled 2023-05-23: qty 5

## 2023-05-23 MED ORDER — SODIUM CHLORIDE 0.9 % IV SOLN
6.0000 mg/kg | Freq: Once | INTRAVENOUS | Status: AC
  Administered 2023-05-23: 360 mg via INTRAVENOUS
  Filled 2023-05-23: qty 36

## 2023-05-23 MED ORDER — FAMOTIDINE IN NACL 20-0.9 MG/50ML-% IV SOLN
20.0000 mg | Freq: Once | INTRAVENOUS | Status: AC
  Administered 2023-05-23: 20 mg via INTRAVENOUS
  Filled 2023-05-23: qty 50

## 2023-05-23 NOTE — Patient Instructions (Signed)
[facility]   **You have been scheduled for the next available CT CAP exam on July 17th at 3 PM. Please arrive to [facility] main entrance for check-in. Do NOT eat or drink anything for 4 hours prior to your appointment. ** Discharge Instructions: Thank you for choosing [facility] to provide your oncology and hematology care.   If you have a lab appointment with the Cancer Center, please go directly to the Cancer Center and check in at the registration area.   Wear comfortable clothing and clothing appropriate for easy access to any Portacath or PICC line.   We strive to give you quality time with your provider. You may need to reschedule your appointment if you arrive late (15 or more minutes).  Arriving late affects you and other patients whose appointments are after yours.  Also, if you miss three or more appointments without notifying the office, you may be dismissed from the clinic at the provider's discretion.      For prescription refill requests, have your pharmacy contact our office and allow 72 hours for refills to be completed.    Today you received the following chemotherapy and/or immunotherapy agents: Sacituzumab govitecan (Trodelvy)      To help prevent nausea and vomiting after your treatment, we encourage you to take your nausea medication as directed.  BELOW ARE SYMPTOMS THAT SHOULD BE REPORTED IMMEDIATELY: *FEVER GREATER THAN 100.4 F (38 C) OR HIGHER *CHILLS OR SWEATING *NAUSEA AND VOMITING THAT IS NOT CONTROLLED WITH YOUR NAUSEA MEDICATION *UNUSUAL SHORTNESS OF BREATH *UNUSUAL BRUISING OR BLEEDING *URINARY PROBLEMS (pain or burning when urinating, or frequent urination) *BOWEL PROBLEMS (unusual diarrhea, constipation, pain near the anus) TENDERNESS IN MOUTH AND THROAT WITH OR WITHOUT PRESENCE OF ULCERS (sore throat, sores in mouth, or a toothache) UNUSUAL RASH, SWELLING OR PAIN  UNUSUAL VAGINAL DISCHARGE OR  ITCHING   Items with * indicate a potential emergency and should be followed up as soon as possible or go to the Emergency Department if any problems should occur.  Please show the CHEMOTHERAPY ALERT CARD or IMMUNOTHERAPY ALERT CARD at check-in to the Emergency Department and triage nurse.  Should you have questions after your visit or need to cancel or reschedule your appointment, please contact [facility]  Dept: [phone]  and follow the prompts.  Office hours are 8:00 a.m. to 4:30 p.m. Monday - Friday. Please note that voicemails left after 4:00 p.m. may not be returned until the following business day.  We are closed weekends and major holidays. You have access to a nurse at all times for urgent questions. Please call the main number to the clinic Dept: [phone] and follow the prompts.   For any non-urgent questions, you may also contact your provider using MyChart. We now offer e-Visits for anyone 18 and older to request care online for non-urgent symptoms. For details visit [email or web address].   Also download the MyChart app! Go to the app store, search "MyChart", open the app, select [place], and log in with your MyChart username and password.   

## 2023-05-29 ENCOUNTER — Other Ambulatory Visit: Payer: Self-pay

## 2023-05-29 ENCOUNTER — Telehealth: Payer: Self-pay

## 2023-05-29 MED ORDER — PREDNISONE 50 MG PO TABS: ORAL_TABLET | ORAL | 0 refills | Status: DC

## 2023-05-29 NOTE — Telephone Encounter (Signed)
Received call from [facility] with Radiology that Pt needs 13 hr prep for 05/30/23 CT CAP.  Called Pt and discussed 13 hours prep. Pt is to take 1 tab prednisone at 0230 AM, second tab at 0830 AM, and final tab along with 50mg  benadryl at 2:30 PM. Pt asked for rx to be sent to [facility] on [address]. Rx sent, Pt verbalized understanding.

## 2023-05-30 ENCOUNTER — Ambulatory Visit (HOSPITAL_COMMUNITY)
Admission: RE | Admit: 2023-05-30 | Discharge: 2023-05-30 | Disposition: A | Discharge status: Home / Self Care | Payer: Medicare HMO | Source: Ambulatory Visit | Attending: Hematology and Oncology | Admitting: Hematology and Oncology

## 2023-05-30 DIAGNOSIS — Z170 Estrogen receptor positive status [ER+]: Secondary | ICD-10-CM | POA: Insufficient documentation

## 2023-05-30 DIAGNOSIS — C50412 Malignant neoplasm of upper-outer quadrant of left female breast: Secondary | ICD-10-CM | POA: Insufficient documentation

## 2023-05-30 MED ORDER — IOHEXOL 300 MG/ML  SOLN
100.0000 mL | Freq: Once | INTRAMUSCULAR | Status: AC | PRN
  Administered 2023-05-30: 100 mL via INTRAVENOUS

## 2023-05-30 MED ORDER — HEPARIN SOD (PORK) LOCK FLUSH 100 UNIT/ML IV SOLN
INTRAVENOUS | Status: AC
  Filled 2023-05-30: qty 5

## 2023-06-06 MED FILL — Fosaprepitant Dimeglumine For IV Infusion 150 MG (Base Eq): INTRAVENOUS | Qty: 5 | Status: AC

## 2023-06-06 MED FILL — Dexamethasone Sodium Phosphate Inj 100 MG/10ML: INTRAMUSCULAR | Qty: 1 | Status: AC

## 2023-06-06 NOTE — Progress Notes (Signed)
Patient Care Team: Dortha Kern, MD as PCP - General (Family Medicine) Chilton Si, MD as PCP - Cardiology (Cardiology) Regan Lemming, MD as PCP - Electrophysiology (Cardiology) Dorothy Puffer, MD as Consulting Physician (Radiation Oncology) Serena Croissant, MD as Consulting Physician (Hematology and Oncology) Harriette Bouillon, MD as Consulting Physician (General Surgery) Windell Norfolk, MD as Consulting Physician (Neurology)  DIAGNOSIS: No diagnosis found.  SUMMARY OF ONCOLOGIC HISTORY: Oncology History  Malignant neoplasm of upper-outer quadrant of left breast in female, estrogen receptor positive (HCC)  03/22/2020 Initial Diagnosis   Diffuse left breast swelling. skin thickening and two areas of irregular hypoechogenicity in the 2-3 o'clock region, 4.9cm, and at least 4 abnormal lymph nodes  with cortical thickening. 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    10/28/2020 - 12/17/2020 Radiation Therapy   The patient initially received a dose of 50.4 Gy in 28 fractions to the left breast and supraclavicular region using whole-breast tangent fields. This was delivered using a 3-D conformal technique. The pt received a boost delivering an additional 10 Gy in 5 fractions using a electron boost with electrons. The total dose was 60.4 Gy.   11/2020 - 11/2027 Anti-estrogen oral therapy   Anastrozole   01/30/2022 Miscellaneous   Guardant360:ERBB2 (exon 20 insertion): Benefit from [place],  MSI high not detected   11/20/2022 Cancer Staging   Staging form: Breast, AJCC 8th Edition - Pathologic: Stage IV (pM1) - Signed by Loa Socks, NP on 11/20/2022   11/20/2022 -  Chemotherapy   Patient is on Treatment Plan : BREAST METASTATIC Sacituzumab govitecan-hziy Drinda Butts) D1,8 q21d       CHIEF COMPLIANT:   INTERVAL HISTORY: SCOTLYNN NOYES is a   ALLERGIES:  is allergic to alcohol, contrast media [iodinated contrast media], iodine, atorvastatin, dust mite extract, latex, lisinopril, molds & smuts, and wound dressing adhesive.  MEDICATIONS:  Current Outpatient Medications  Medication Sig Dispense Refill   alendronate (FOSAMAX) 70 MG tablet Take 1 tablet (70 mg total) by mouth once a week. Take with a full glass of water on an empty stomach. 12 tablet 3   amiodarone (PACERONE) 400 MG tablet Take 1 tablet (400 mg total) by mouth daily. 90 tablet 3   apixaban (ELIQUIS) 5 MG TABS tablet Take 1 tablet (5 mg total) by mouth 2 (two) times daily. For 3 months 60 tablet 3   furosemide (LASIX) 20 MG tablet Take 20 mg by mouth daily.     levETIRAcetam (KEPPRA) 750 MG tablet Take 1 tablet (750 mg total) by mouth 2 (two) times daily. 60 tablet 0   lidocaine-prilocaine (EMLA) cream Apply to affected area once (Patient taking differently: Apply 1 Application topically daily as needed (For pain).) 30 g 3   liothyronine (CYTOMEL) 25 MCG tablet Take 25 mcg by mouth daily.     metoprolol succinate (TOPROL-XL) 100 MG 24 hr tablet Take 1 tablet (100 mg total) by mouth daily. Take with or immediately following a meal. 30 tablet 6  polyethylene glycol powder (GLYCOLAX/MIRALAX) 17 GM/SCOOP powder Take 17 g by mouth 2 (two) times daily. 255 g 0   potassium chloride SA (K-DUR,KLOR-CON) 20 MEQ tablet Take 20 mEq by mouth 2 (two) times daily.     predniSONE (DELTASONE) 50 MG tablet Take 1 tablet 13, 7, and 1 hour prior to CT scan. Take benadryl 50mg  oral 1 hour prior to CT scan. 3 tablet 0   PROLENSA  0.07 % SOLN Place 1 drop into the right eye 4 (four) times daily.     Propylene Glycol (SYSTANE BALANCE) 0.6 % SOLN Place 2 drops into the right eye in the morning and at bedtime.     sacubitril-valsartan (ENTRESTO) 97-103 MG Take 1 tablet by mouth 2 (two) times daily. 60 tablet 5   TRUE METRIX BLOOD GLUCOSE TEST test strip SMARTSIG:Via Meter     TRUEplus Lancets 33G MISC      No current facility-administered medications for this visit.    PHYSICAL EXAMINATION: ECOG PERFORMANCE STATUS: {CHL ONC ECOG PS:[id number]}  There were no vitals filed for this visit. There were no vitals filed for this visit.  BREAST:*** No palpable masses or nodules in either right or left breasts. No palpable axillary supraclavicular or infraclavicular adenopathy no breast tenderness or nipple discharge. (exam performed in the presence of a chaperone)  LABORATORY DATA:  I have reviewed the data as listed    Latest Ref Rng & Units 05/23/2023    8:29 AM 05/15/2023    9:08 AM 05/02/2023    9:15 AM  CMP  Glucose 70 - 99 mg/dL 324  401  027   BUN 8 - 23 mg/dL 18  9  13    Creatinine 0.44 - 1.00 mg/dL 2.53  6.64  4.03   Sodium 135 - 145 mmol/L 137  139  140   Potassium 3.5 - 5.1 mmol/L 4.1  3.8  4.0   Chloride 98 - 111 mmol/L 106  105  106   CO2 22 - 32 mmol/L 25  25  26    Calcium 8.9 - 10.3 mg/dL 9.2  8.8  9.1   Total Protein 6.5 - 8.1 g/dL 6.7  6.7  6.2   Total Bilirubin 0.3 - 1.2 mg/dL 0.4  0.6  0.5   Alkaline Phos 38 - 126 U/L 73  86  81   AST 15 - 41 U/L 13  14  14    ALT 0 - 44 U/L 10  8  12      Lab Results  Component Value Date   WBC 4.2 05/23/2023   HGB 13.1 05/23/2023   HCT 40.4 05/23/2023   MCV 91.6 05/23/2023   PLT 237 05/23/2023   NEUTROABS 2.1 05/23/2023    ASSESSMENT & PLAN:  No problem-specific Assessment & Plan notes found for this encounter.    No orders of the defined types were placed in this encounter.  The patient has a good understanding of the overall plan. she agrees with  it. she will call with any problems that may develop before the next visit here. Total time spent: 30 mins including face to face time and time spent for planning, charting and co-ordination of care   Sherlyn Lick, CMA 06/06/23    I Janan Ridge am acting as a Neurosurgeon for [employer]  ***

## 2023-06-07 ENCOUNTER — Encounter: Payer: Self-pay | Admitting: Hematology and Oncology

## 2023-06-07 ENCOUNTER — Inpatient Hospital Stay: Payer: Medicare HMO

## 2023-06-07 ENCOUNTER — Inpatient Hospital Stay (HOSPITAL_BASED_OUTPATIENT_CLINIC_OR_DEPARTMENT_OTHER): Payer: Medicare HMO | Admitting: Hematology and Oncology

## 2023-06-07 ENCOUNTER — Other Ambulatory Visit: Payer: Self-pay

## 2023-06-07 VITALS — BP 116/104 | HR 70 | Temp 97.9°F | Resp 18 | Ht 62.0 in | Wt 137.7 lb

## 2023-06-07 VITALS — BP 155/97 | HR 63 | Resp 16

## 2023-06-07 DIAGNOSIS — C50412 Malignant neoplasm of upper-outer quadrant of left female breast: Secondary | ICD-10-CM

## 2023-06-07 DIAGNOSIS — Z5112 Encounter for antineoplastic immunotherapy: Secondary | ICD-10-CM | POA: Diagnosis not present

## 2023-06-07 DIAGNOSIS — Z170 Estrogen receptor positive status [ER+]: Secondary | ICD-10-CM

## 2023-06-07 DIAGNOSIS — Z95828 Presence of other vascular implants and grafts: Secondary | ICD-10-CM

## 2023-06-07 LAB — PHOSPHORUS: Phosphorus: 3.8 mg/dL (ref 2.5–4.6)

## 2023-06-07 LAB — CBC WITH DIFFERENTIAL (CANCER CENTER ONLY)
Abs Immature Granulocytes: 0.03 10*3/uL (ref 0.00–0.07)
Basophils Absolute: 0 10*3/uL (ref 0.0–0.1)
Basophils Relative: 1 %
Eosinophils Absolute: 0.1 10*3/uL (ref 0.0–0.5)
Eosinophils Relative: 2 %
HCT: 38.5 % (ref 36.0–46.0)
Hemoglobin: 12.7 g/dL (ref 12.0–15.0)
Immature Granulocytes: 1 %
Lymphocytes Relative: 25 %
Lymphs Abs: 1.2 10*3/uL (ref 0.7–4.0)
MCH: 29.9 pg (ref 26.0–34.0)
MCHC: 33 g/dL (ref 30.0–36.0)
MCV: 90.6 fL (ref 80.0–100.0)
Monocytes Absolute: 0.9 10*3/uL (ref 0.1–1.0)
Monocytes Relative: 18 %
Neutro Abs: 2.6 10*3/uL (ref 1.7–7.7)
Neutrophils Relative %: 53 %
Platelet Count: 196 10*3/uL (ref 150–400)
RBC: 4.25 MIL/uL (ref 3.87–5.11)
RDW: 17.8 % — ABNORMAL HIGH (ref 11.5–15.5)
WBC Count: 4.8 10*3/uL (ref 4.0–10.5)
nRBC: 0 % (ref 0.0–0.2)

## 2023-06-07 LAB — CMP (CANCER CENTER ONLY)
ALT: 8 U/L (ref 0–44)
AST: 13 U/L — ABNORMAL LOW (ref 15–41)
Albumin: 3.9 g/dL (ref 3.5–5.0)
Alkaline Phosphatase: 80 U/L (ref 38–126)
Anion gap: 10 (ref 5–15)
BUN: 12 mg/dL (ref 8–23)
CO2: 24 mmol/L (ref 22–32)
Calcium: 9.3 mg/dL (ref 8.9–10.3)
Chloride: 107 mmol/L (ref 98–111)
Creatinine: 0.86 mg/dL (ref 0.44–1.00)
GFR, Estimated: >60 mL/min (ref >60)
Glucose, Bld: 153 mg/dL — ABNORMAL HIGH (ref 70–99)
Potassium: 3.7 mmol/L (ref 3.5–5.1)
Sodium: 141 mmol/L (ref 135–145)
Total Bilirubin: 0.6 mg/dL (ref 0.3–1.2)
Total Protein: 6.5 g/dL (ref 6.5–8.1)

## 2023-06-07 LAB — MAGNESIUM: Magnesium: 1.9 mg/dL (ref 1.7–2.4)

## 2023-06-07 MED ORDER — FAMOTIDINE IN NACL 20-0.9 MG/50ML-% IV SOLN
20.0000 mg | Freq: Once | INTRAVENOUS | Status: AC
  Administered 2023-06-07: 20 mg via INTRAVENOUS
  Filled 2023-06-07: qty 50

## 2023-06-07 MED ORDER — SODIUM CHLORIDE 0.9 % IV SOLN: Freq: Once | INTRAVENOUS | Status: AC

## 2023-06-07 MED ORDER — SODIUM CHLORIDE 0.9 % IV SOLN
10.0000 mg | Freq: Once | INTRAVENOUS | Status: AC
  Administered 2023-06-07: 10 mg via INTRAVENOUS
  Filled 2023-06-07: qty 10

## 2023-06-07 MED ORDER — SODIUM CHLORIDE 0.9% FLUSH
10.0000 mL | Freq: Once | INTRAVENOUS | Status: AC
  Administered 2023-06-07: 10 mL

## 2023-06-07 MED ORDER — CETIRIZINE HCL 10 MG/ML IV SOLN
10.0000 mg | Freq: Once | INTRAVENOUS | Status: AC
  Administered 2023-06-07: 10 mg via INTRAVENOUS
  Filled 2023-06-07: qty 1

## 2023-06-07 MED ORDER — HEPARIN SOD (PORK) LOCK FLUSH 100 UNIT/ML IV SOLN
500.0000 [IU] | Freq: Once | INTRAVENOUS | Status: AC | PRN
  Administered 2023-06-07: 500 [IU]

## 2023-06-07 MED ORDER — ACETAMINOPHEN 325 MG PO TABS
650.0000 mg | ORAL_TABLET | Freq: Once | ORAL | Status: AC
  Administered 2023-06-07: 650 mg via ORAL
  Filled 2023-06-07: qty 2

## 2023-06-07 MED ORDER — PALONOSETRON HCL INJECTION 0.25 MG/5ML
0.2500 mg | Freq: Once | INTRAVENOUS | Status: AC
  Administered 2023-06-07: 0.25 mg via INTRAVENOUS
  Filled 2023-06-07: qty 5

## 2023-06-07 MED ORDER — SODIUM CHLORIDE 0.9% FLUSH
10.0000 mL | INTRAVENOUS | Status: DC | PRN
  Administered 2023-06-07: 10 mL

## 2023-06-07 MED ORDER — SODIUM CHLORIDE 0.9 % IV SOLN
150.0000 mg | Freq: Once | INTRAVENOUS | Status: AC
  Administered 2023-06-07: 150 mg via INTRAVENOUS
  Filled 2023-06-07: qty 150

## 2023-06-07 MED ORDER — SODIUM CHLORIDE 0.9 % IV SOLN
6.0000 mg/kg | Freq: Once | INTRAVENOUS | Status: AC
  Administered 2023-06-07: 360 mg via INTRAVENOUS
  Filled 2023-06-07: qty 36

## 2023-06-07 NOTE — Assessment & Plan Note (Signed)
03/22/2020:diffuse left breast swelling. skin thickening and two areas of irregular hypoechogenicity in the 2-3 o'clock region, 4.9cm, and at least 4 abnormal lymph nodes  with cortical thickening. 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  Neoadjuvant chemotherapy with CMF 2.  09/14/2020:Left mastectomy ([name]): invasive and in situ ductal carcinoma, grade 2, 15cm, clear margins, with 14/14 lymph nodes positive for metastatic carcinoma with extracapsular extension.  ER 30%, PR 10%, HER-2 negative, Ki-67 15% 3.  Adjuvant radiation 10/28/2020-12/17/2020 4.  Followed by adjuvant antiestrogen therapy with anastrozole started 01/11/2021 5.  12/07/2021: Liver metastases: Biopsy consistent with metastatic breast cancer ER 0%, PR 0%, HER2 1+ 6.  Capecitabine 1000 mg p.o. twice daily 2 weeks on 1 week off started 01/11/2022-10/31/2022.  Progression ----------------------------------------------------------------------------------------------------------------------- Current treatment: Trodelvy started 11/20/2022, today is cycle 10 Toxicities: Fatigue is moderate in nature possibly due to lack of interest rather than side effect of treatment Fogginess in the head:  Constipation   Purel induced seizure-like activity: Patient says that he is allergic to purell   Hospitalization 03/23/2023-03/25/2023: Breakthrough seizures, AICD firing and abdominal pain treated with amiodarone, CT abdomen pelvis: Liver metastases 1.1 cm (previously 3.2 cm) portacaval lymph node stable  CT CAP 06/01/2023: Hepatic metastases have decreased, T8 compression deformity with sclerosis is indeterminate.  Return to clinic in 3 weeks

## 2023-06-07 NOTE — Patient Instructions (Signed)
[facility]   **You have been scheduled for the next available CT CAP exam on July 17th at 3 PM. Please arrive to [facility] main entrance for check-in. Do NOT eat or drink anything for 4 hours prior to your appointment. ** Discharge Instructions: Thank you for choosing [facility] to provide your oncology and hematology care.   If you have a lab appointment with the Cancer Center, please go directly to the Cancer Center and check in at the registration area.   Wear comfortable clothing and clothing appropriate for easy access to any Portacath or PICC line.   We strive to give you quality time with your provider. You may need to reschedule your appointment if you arrive late (15 or more minutes).  Arriving late affects you and other patients whose appointments are after yours.  Also, if you miss three or more appointments without notifying the office, you may be dismissed from the clinic at the provider's discretion.      For prescription refill requests, have your pharmacy contact our office and allow 72 hours for refills to be completed.    Today you received the following chemotherapy and/or immunotherapy agents: Sacituzumab govitecan Drinda Butts)      To help prevent nausea and vomiting after your treatment, we encourage you to take your nausea medication as directed.  BELOW ARE SYMPTOMS THAT SHOULD BE REPORTED IMMEDIATELY: *FEVER GREATER THAN 100.4 F (38 C) OR HIGHER *CHILLS OR SWEATING *NAUSEA AND VOMITING THAT IS NOT CONTROLLED WITH YOUR NAUSEA MEDICATION *UNUSUAL SHORTNESS OF BREATH *UNUSUAL BRUISING OR BLEEDING *URINARY PROBLEMS (pain or burning when urinating, or frequent urination) *BOWEL PROBLEMS (unusual diarrhea, constipation, pain near the anus) TENDERNESS IN MOUTH AND THROAT WITH OR WITHOUT PRESENCE OF ULCERS (sore throat, sores in mouth, or a toothache) UNUSUAL RASH, SWELLING OR PAIN  UNUSUAL VAGINAL DISCHARGE OR  ITCHING   Items with * indicate a potential emergency and should be followed up as soon as possible or go to the Emergency Department if any problems should occur.  Please show the CHEMOTHERAPY ALERT CARD or IMMUNOTHERAPY ALERT CARD at check-in to the Emergency Department and triage nurse.  Should you have questions after your visit or need to cancel or reschedule your appointment, please contact [facility]  Dept: [phone]  and follow the prompts.  Office hours are 8:00 a.m. to 4:30 p.m. Monday - Friday. Please note that voicemails left after 4:00 p.m. may not be returned until the following business day.  We are closed weekends and major holidays. You have access to a nurse at all times for urgent questions. Please call the main number to the clinic Dept: [phone] and follow the prompts.   For any non-urgent questions, you may also contact your provider using MyChart. We now offer e-Visits for anyone 104 and older to request care online for non-urgent symptoms. For details visit [email or web address].   Also download the MyChart app! Go to the app store, search "MyChart", open the app, select [facility], and log in with your MyChart username and password.

## 2023-06-12 ENCOUNTER — Ambulatory Visit (HOSPITAL_BASED_OUTPATIENT_CLINIC_OR_DEPARTMENT_OTHER): Payer: Medicare HMO | Admitting: Family

## 2023-06-12 MED FILL — Fosaprepitant Dimeglumine For IV Infusion 150 MG (Base Eq): INTRAVENOUS | Qty: 5 | Status: AC

## 2023-06-12 MED FILL — Dexamethasone Sodium Phosphate Inj 100 MG/10ML: INTRAMUSCULAR | Qty: 1 | Status: AC

## 2023-06-13 ENCOUNTER — Inpatient Hospital Stay: Payer: Medicare HMO

## 2023-06-13 ENCOUNTER — Encounter: Payer: Self-pay | Admitting: Hematology and Oncology

## 2023-06-13 ENCOUNTER — Other Ambulatory Visit: Payer: Self-pay

## 2023-06-13 VITALS — BP 135/82 | HR 62 | Temp 98.2°F | Resp 18 | Wt 134.8 lb

## 2023-06-13 DIAGNOSIS — Z5112 Encounter for antineoplastic immunotherapy: Secondary | ICD-10-CM | POA: Diagnosis not present

## 2023-06-13 DIAGNOSIS — Z170 Estrogen receptor positive status [ER+]: Secondary | ICD-10-CM

## 2023-06-13 DIAGNOSIS — Z95828 Presence of other vascular implants and grafts: Secondary | ICD-10-CM

## 2023-06-13 LAB — CBC WITH DIFFERENTIAL (CANCER CENTER ONLY)
Abs Immature Granulocytes: 0.05 10*3/uL (ref 0.00–0.07)
Basophils Absolute: 0 10*3/uL (ref 0.0–0.1)
Basophils Relative: 0 %
Eosinophils Absolute: 0.1 10*3/uL (ref 0.0–0.5)
Eosinophils Relative: 1 %
HCT: 36.8 % (ref 36.0–46.0)
Hemoglobin: 12.4 g/dL (ref 12.0–15.0)
Immature Granulocytes: 1 %
Lymphocytes Relative: 22 %
Lymphs Abs: 1 10*3/uL (ref 0.7–4.0)
MCH: 30.2 pg (ref 26.0–34.0)
MCHC: 33.7 g/dL (ref 30.0–36.0)
MCV: 89.5 fL (ref 80.0–100.0)
Monocytes Absolute: 0.5 10*3/uL (ref 0.1–1.0)
Monocytes Relative: 11 %
Neutro Abs: 3 10*3/uL (ref 1.7–7.7)
Neutrophils Relative %: 65 %
Platelet Count: 209 10*3/uL (ref 150–400)
RBC: 4.11 MIL/uL (ref 3.87–5.11)
RDW: 17.4 % — ABNORMAL HIGH (ref 11.5–15.5)
WBC Count: 4.6 10*3/uL (ref 4.0–10.5)
nRBC: 0 % (ref 0.0–0.2)

## 2023-06-13 LAB — CMP (CANCER CENTER ONLY)
ALT: 9 U/L (ref 0–44)
AST: 10 U/L — ABNORMAL LOW (ref 15–41)
Albumin: 3.8 g/dL (ref 3.5–5.0)
Alkaline Phosphatase: 67 U/L (ref 38–126)
Anion gap: 7 (ref 5–15)
BUN: 16 mg/dL (ref 8–23)
CO2: 27 mmol/L (ref 22–32)
Calcium: 8.8 mg/dL — ABNORMAL LOW (ref 8.9–10.3)
Chloride: 106 mmol/L (ref 98–111)
Creatinine: 0.89 mg/dL (ref 0.44–1.00)
GFR, Estimated: >60 mL/min (ref >60)
Glucose, Bld: 163 mg/dL — ABNORMAL HIGH (ref 70–99)
Potassium: 3.4 mmol/L — ABNORMAL LOW (ref 3.5–5.1)
Sodium: 140 mmol/L (ref 135–145)
Total Bilirubin: 0.4 mg/dL (ref 0.3–1.2)
Total Protein: 6.4 g/dL — ABNORMAL LOW (ref 6.5–8.1)

## 2023-06-13 LAB — MAGNESIUM: Magnesium: 1.8 mg/dL (ref 1.7–2.4)

## 2023-06-13 LAB — PHOSPHORUS: Phosphorus: 2.9 mg/dL (ref 2.5–4.6)

## 2023-06-13 MED ORDER — SODIUM CHLORIDE 0.9 % IV SOLN
10.0000 mg | Freq: Once | INTRAVENOUS | Status: AC
  Administered 2023-06-13: 10 mg via INTRAVENOUS
  Filled 2023-06-13: qty 10

## 2023-06-13 MED ORDER — SODIUM CHLORIDE 0.9 % IV SOLN
150.0000 mg | Freq: Once | INTRAVENOUS | Status: AC
  Administered 2023-06-13: 150 mg via INTRAVENOUS
  Filled 2023-06-13: qty 150

## 2023-06-13 MED ORDER — HEPARIN SOD (PORK) LOCK FLUSH 100 UNIT/ML IV SOLN
500.0000 [IU] | Freq: Once | INTRAVENOUS | Status: AC | PRN
  Administered 2023-06-13: 500 [IU]

## 2023-06-13 MED ORDER — SODIUM CHLORIDE 0.9% FLUSH
10.0000 mL | INTRAVENOUS | Status: DC | PRN
  Administered 2023-06-13: 10 mL

## 2023-06-13 MED ORDER — SODIUM CHLORIDE 0.9 % IV SOLN
6.0000 mg/kg | Freq: Once | INTRAVENOUS | Status: AC
  Administered 2023-06-13: 360 mg via INTRAVENOUS
  Filled 2023-06-13: qty 36

## 2023-06-13 MED ORDER — CETIRIZINE HCL 10 MG/ML IV SOLN
10.0000 mg | Freq: Once | INTRAVENOUS | Status: AC
  Administered 2023-06-13: 10 mg via INTRAVENOUS
  Filled 2023-06-13: qty 1

## 2023-06-13 MED ORDER — ACETAMINOPHEN 325 MG PO TABS
650.0000 mg | ORAL_TABLET | Freq: Once | ORAL | Status: AC
  Administered 2023-06-13: 650 mg via ORAL
  Filled 2023-06-13: qty 2

## 2023-06-13 MED ORDER — SODIUM CHLORIDE 0.9 % IV SOLN: Freq: Once | INTRAVENOUS | Status: AC

## 2023-06-13 MED ORDER — SODIUM CHLORIDE 0.9% FLUSH
10.0000 mL | Freq: Once | INTRAVENOUS | Status: AC
  Administered 2023-06-13: 10 mL

## 2023-06-13 MED ORDER — ATROPINE SULFATE 1 MG/ML IV SOLN
0.5000 mg | Freq: Once | INTRAVENOUS | Status: DC | PRN
  Filled 2023-06-13: qty 1

## 2023-06-13 MED ORDER — FAMOTIDINE IN NACL 20-0.9 MG/50ML-% IV SOLN
20.0000 mg | Freq: Once | INTRAVENOUS | Status: AC
  Administered 2023-06-13: 20 mg via INTRAVENOUS
  Filled 2023-06-13: qty 50

## 2023-06-13 MED ORDER — PALONOSETRON HCL INJECTION 0.25 MG/5ML
0.2500 mg | Freq: Once | INTRAVENOUS | Status: AC
  Administered 2023-06-13: 0.25 mg via INTRAVENOUS
  Filled 2023-06-13: qty 5

## 2023-06-13 NOTE — Patient Instructions (Signed)
[facility]   **You have been scheduled for the next available CT CAP exam on July 17th at 3 PM. Please arrive to [facility] main entrance for check-in. Do NOT eat or drink anything for 4 hours prior to your appointment. ** Discharge Instructions: Thank you for choosing [facility] to provide your oncology and hematology care.   If you have a lab appointment with the Cancer Center, please go directly to the Cancer Center and check in at the registration area.   Wear comfortable clothing and clothing appropriate for easy access to any Portacath or PICC line.   We strive to give you quality time with your provider. You may need to reschedule your appointment if you arrive late (15 or more minutes).  Arriving late affects you and other patients whose appointments are after yours.  Also, if you miss three or more appointments without notifying the office, you may be dismissed from the clinic at the provider's discretion.      For prescription refill requests, have your pharmacy contact our office and allow 72 hours for refills to be completed.    Today you received the following chemotherapy and/or immunotherapy agents: Sacituzumab govitecan Drinda Butts)      To help prevent nausea and vomiting after your treatment, we encourage you to take your nausea medication as directed.  BELOW ARE SYMPTOMS THAT SHOULD BE REPORTED IMMEDIATELY: *FEVER GREATER THAN 100.4 F (38 C) OR HIGHER *CHILLS OR SWEATING *NAUSEA AND VOMITING THAT IS NOT CONTROLLED WITH YOUR NAUSEA MEDICATION *UNUSUAL SHORTNESS OF BREATH *UNUSUAL BRUISING OR BLEEDING *URINARY PROBLEMS (pain or burning when urinating, or frequent urination) *BOWEL PROBLEMS (unusual diarrhea, constipation, pain near the anus) TENDERNESS IN MOUTH AND THROAT WITH OR WITHOUT PRESENCE OF ULCERS (sore throat, sores in mouth, or a toothache) UNUSUAL RASH, SWELLING OR PAIN  UNUSUAL VAGINAL DISCHARGE OR  ITCHING   Items with * indicate a potential emergency and should be followed up as soon as possible or go to the Emergency Department if any problems should occur.  Please show the CHEMOTHERAPY ALERT CARD or IMMUNOTHERAPY ALERT CARD at check-in to the Emergency Department and triage nurse.  Should you have questions after your visit or need to cancel or reschedule your appointment, please contact [facility]  Dept: [phone]  and follow the prompts.  Office hours are 8:00 a.m. to 4:30 p.m. Monday - Friday. Please note that voicemails left after 4:00 p.m. may not be returned until the following business day.  We are closed weekends and major holidays. You have access to a nurse at all times for urgent questions. Please call the main number to the clinic Dept: [phone] and follow the prompts.   For any non-urgent questions, you may also contact your provider using MyChart. We now offer e-Visits for anyone 104 and older to request care online for non-urgent symptoms. For details visit [email or web address].   Also download the MyChart app! Go to the app store, search "MyChart", open the app, select [facility], and log in with your MyChart username and password.

## 2023-06-13 NOTE — Progress Notes (Signed)
This encounter was created in error - please disregard.

## 2023-06-25 ENCOUNTER — Telehealth: Payer: Self-pay

## 2023-06-25 ENCOUNTER — Telehealth: Payer: Self-pay | Admitting: Hematology and Oncology

## 2023-06-25 DIAGNOSIS — Z170 Estrogen receptor positive status [ER+]: Secondary | ICD-10-CM

## 2023-06-25 MED ORDER — ALENDRONATE SODIUM 70 MG PO TABS
70.0000 mg | ORAL_TABLET | ORAL | 3 refills | Status: DC
Start: 2023-06-25 — End: 2024-01-20

## 2023-06-25 NOTE — Telephone Encounter (Signed)
Scheduled appointments per WQ. Patient is aware of the made appointments.  

## 2023-06-25 NOTE — Telephone Encounter (Signed)
Pt called and requested refill for fosamax. Advised pt we would refill per MD.

## 2023-06-25 NOTE — Progress Notes (Unsigned)
Palliative Medicine [facility]  [phone]) [phone] [phone]) [phone]   Name: Colleen Lowery Date: 06/25/2023 MRN: [id number]  DOB: Apr 22, 1941  Patient Care Team: Dortha Kern, MD as PCP - General (Family Medicine) Chilton Si, MD as PCP - Cardiology (Cardiology) Regan Lemming, MD as PCP - Electrophysiology (Cardiology) Dorothy Puffer, MD as Consulting Physician (Radiation Oncology) Serena Croissant, MD as Consulting Physician (Hematology and Oncology) Harriette Bouillon, MD as Consulting Physician (General Surgery) Windell Norfolk, MD as Consulting Physician (Neurology)    INTERVAL HISTORY: Colleen Lowery is a 82 y.o. 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 Past medical history also includes seizures, diabetes, and HTN. Palliative ask to see for symptom management and goals of care.   SOCIAL HISTORY:     reports that she has been smoking cigarettes. She has a 12.5 pack-year smoking history. She has never used smokeless tobacco. She reports that she does not drink alcohol and does not use drugs.  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 , TAKING OTC MED FORGYMEMA   GERD (gastroesophageal reflux disease)    Headache    sinus   Heart failure, acute, systolic and diastolic (HCC) MARCH 2017 DX   History of colonic polyps    Hypertension    Hypothyroidism    per patient, takes OTC meds when temperature drops low   IBS (irritable bowel syndrome)    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  MEDICATIONS:  Current Outpatient Medications  Medication Sig Dispense Refill   alendronate (FOSAMAX) 70 MG tablet Take 1 tablet (70 mg total) by mouth once a week. Take with a full glass of water on an empty stomach. 12 tablet 3   amiodarone (PACERONE) 400 MG tablet Take 1 tablet (400 mg total) by mouth daily. 90 tablet 3   apixaban (ELIQUIS) 5 MG TABS tablet Take 1 tablet (5 mg total) by mouth 2 (two) times daily. For 3 months 60 tablet 3   furosemide (LASIX) 20 MG tablet Take 20 mg by mouth daily.     levETIRAcetam (KEPPRA) 750 MG tablet Take 1 tablet (750 mg total) by mouth 2 (two) times daily. 60 tablet 0   lidocaine-prilocaine (EMLA) cream Apply to affected area once (Patient taking differently: Apply 1 Application topically daily as needed (For pain).) 30 g 3   liothyronine (CYTOMEL) 25 MCG tablet Take 25 mcg by mouth daily.     metoprolol succinate (TOPROL-XL) 100 MG 24 hr tablet Take 1 tablet (100 mg total) by mouth daily. Take with or immediately following a meal. 30  tablet 6   polyethylene glycol powder (GLYCOLAX/MIRALAX) 17 GM/SCOOP powder Take 17 g by mouth 2 (two) times daily. 255 g 0   potassium chloride SA (K-DUR,KLOR-CON) 20 MEQ tablet Take 20 mEq by mouth 2 (two) times daily.     PROLENSA 0.07 % SOLN Place 1 drop into the right eye 4 (four) times daily.     Propylene Glycol (SYSTANE BALANCE) 0.6 % SOLN Place 2 drops into the right eye in the morning and at bedtime.     sacubitril-valsartan (ENTRESTO) 97-103 MG Take 1 tablet by mouth 2 (two) times daily. 60 tablet 5   TRUE METRIX BLOOD GLUCOSE TEST test strip SMARTSIG:Via Meter      TRUEplus Lancets 33G MISC      No current facility-administered medications for this visit.    VITAL SIGNS: There were no vitals taken for this visit. There were no vitals filed for this visit.  Estimated body mass index is 24.65 kg/m as calculated from the following:   Height as of 06/07/23: 5\' 2"  (1.575 m).   Weight as of 06/13/23: 134 lb 12 oz (61.1 kg).   PERFORMANCE STATUS (ECOG) : 1 - Symptomatic but completely ambulatory   Physical Exam General: NAD Cardiovascular: regular rate and rhythm Pulmonary: clear ant fields Abdomen: soft, nontender, + bowel sounds,  kin: no rashes Neurological: A/O x 4,   IMPRESSION: [name] presented to clinic today for follow-up. Denies nausea, vomiting, constipation, or diarrhea. She endorses ongoing fatigue which she feels is quite significant to the point she has requested to delay treatment today.   Anxiety: Ms.[name] continues to experience anxiety related to going out in public. She is afraid of coming into contact with strong odors, especially Purell hand sanitizer - that might trigger more seizures. States coming to the cancer center is like coming into a "chemical land mind" which she contributes to feeling poorly.   Emotional supports provided and encouragement to continue utilizing the Valium prior to appointments to help decrease anxiety.     2. Goals of Care I created space and opportunity for [name] to express her thoughts and feelings. She is emotional expressing the importance of her quality of life over quantity. Speaks to her cancer being metastatic and awareness that current treatment is keeping "things" stable with some shrinkage while also realistically understanding that it is not curative. Shares that if she chose not to continue with treatment she would focus on doing things that she need to with hopes of not feeling so poorly. Expresses by the time she feels a little better it is time for treatment again and start  the cycle over again. Emotional support provided.   We discussed making informed decisions while also focusing on what is most important for her and her wishes. She is agreeable in plans to allow some time to think about what it is she would want for herself and if she would want to pursue additional treatments in the future. Plans to discuss further with Oncologist at follow-up visit at next visit. States she may be in a different mindset at that time or she may remain persistent on not resuming treatment depending on how she feels over the next 3 weeks.   We discussed her current illness and what it means in the larger context of Her on-going co-morbidities. Natural disease trajectory and expectations were discussed. We discussed the importance of continued conversation with family and their medical providers regarding overall plan of care and treatment options, ensuring decisions  are within the context of the patients values and GOCs.  3/12- Ms. Plemons completed HCPOA paperwork listing her brother Juliene Pina as her primary decision maker in the event she is unable to speak for herself.  Ms. Nur remains clear in expressed wishes to continue to treat the the treatable allowing her every opportunity to continue to thrive. Taking life one day at a time. She is encouraged to notify the palliative care office for any symptoms that may arise, such as pain.    PLAN: Monitor use and efficacy of Valium in the management of anxiety. Encourage reporting of any symptoms that may arise. Continue goals of care conversations regarding patient's wishes in the context of her disease process. Palliative will plan to see patient back in 3-4 weeks in collaboration to other oncology appointments.    Patient expressed understanding and was in agreement with this plan. She also understands that She can call the clinic at any time with any questions, concerns, or complaints.   Any controlled substances utilized were  prescribed in the context of palliative care. PDMP has been reviewed.    Visit consisted of counseling and education dealing with the complex and emotionally intense issues of symptom management and palliative care in the setting of serious and potentially life-threatening illness.Greater than 50%  of this time was spent counseling and coordinating care related to the above assessment and plan.  Willette Alma, AGPCNP-BC  Palliative Medicine Team/[facility]  *Please note that this is a verbal dictation therefore any spelling or grammatical errors are due to the "[employer] One" system interpretation.

## 2023-06-26 MED FILL — Fosaprepitant Dimeglumine For IV Infusion 150 MG (Base Eq): INTRAVENOUS | Qty: 5 | Status: AC

## 2023-06-26 MED FILL — Dexamethasone Sodium Phosphate Inj 100 MG/10ML: INTRAMUSCULAR | Qty: 1 | Status: AC

## 2023-06-27 ENCOUNTER — Encounter: Payer: Self-pay | Admitting: Nurse Practitioner

## 2023-06-27 ENCOUNTER — Other Ambulatory Visit: Payer: Self-pay

## 2023-06-27 ENCOUNTER — Inpatient Hospital Stay (HOSPITAL_BASED_OUTPATIENT_CLINIC_OR_DEPARTMENT_OTHER): Payer: Medicare HMO | Admitting: Adult Health

## 2023-06-27 ENCOUNTER — Inpatient Hospital Stay: Payer: Medicare HMO | Attending: Hematology and Oncology

## 2023-06-27 ENCOUNTER — Inpatient Hospital Stay (HOSPITAL_BASED_OUTPATIENT_CLINIC_OR_DEPARTMENT_OTHER): Payer: Medicare HMO | Admitting: Nurse Practitioner

## 2023-06-27 ENCOUNTER — Telehealth: Payer: Self-pay | Admitting: Adult Health

## 2023-06-27 ENCOUNTER — Inpatient Hospital Stay: Payer: Medicare HMO

## 2023-06-27 ENCOUNTER — Encounter: Payer: Self-pay | Admitting: Adult Health

## 2023-06-27 VITALS — BP 194/96 | HR 89 | Temp 98.9°F | Resp 19 | Wt 136.5 lb

## 2023-06-27 DIAGNOSIS — Z95828 Presence of other vascular implants and grafts: Secondary | ICD-10-CM

## 2023-06-27 DIAGNOSIS — C787 Secondary malignant neoplasm of liver and intrahepatic bile duct: Secondary | ICD-10-CM | POA: Diagnosis not present

## 2023-06-27 DIAGNOSIS — R569 Unspecified convulsions: Secondary | ICD-10-CM

## 2023-06-27 DIAGNOSIS — Z79811 Long term (current) use of aromatase inhibitors: Secondary | ICD-10-CM | POA: Insufficient documentation

## 2023-06-27 DIAGNOSIS — C50412 Malignant neoplasm of upper-outer quadrant of left female breast: Secondary | ICD-10-CM

## 2023-06-27 DIAGNOSIS — C773 Secondary and unspecified malignant neoplasm of axilla and upper limb lymph nodes: Secondary | ICD-10-CM | POA: Insufficient documentation

## 2023-06-27 DIAGNOSIS — Z170 Estrogen receptor positive status [ER+]: Secondary | ICD-10-CM

## 2023-06-27 DIAGNOSIS — R530 Neoplastic (malignant) related fatigue: Secondary | ICD-10-CM

## 2023-06-27 DIAGNOSIS — F419 Anxiety disorder, unspecified: Secondary | ICD-10-CM

## 2023-06-27 LAB — CMP (CANCER CENTER ONLY)
ALT: 53 U/L — ABNORMAL HIGH (ref 0–44)
AST: 21 U/L (ref 15–41)
Albumin: 3.6 g/dL (ref 3.5–5.0)
Alkaline Phosphatase: 91 U/L (ref 38–126)
Anion gap: 8 (ref 5–15)
BUN: 10 mg/dL (ref 8–23)
CO2: 26 mmol/L (ref 22–32)
Calcium: 8.3 mg/dL — ABNORMAL LOW (ref 8.9–10.3)
Chloride: 105 mmol/L (ref 98–111)
Creatinine: 0.76 mg/dL (ref 0.44–1.00)
GFR, Estimated: >60 mL/min (ref >60)
Glucose, Bld: 148 mg/dL — ABNORMAL HIGH (ref 70–99)
Potassium: 3.2 mmol/L — ABNORMAL LOW (ref 3.5–5.1)
Sodium: 139 mmol/L (ref 135–145)
Total Bilirubin: 0.6 mg/dL (ref 0.3–1.2)
Total Protein: 6.2 g/dL — ABNORMAL LOW (ref 6.5–8.1)

## 2023-06-27 LAB — MAGNESIUM: Magnesium: 1.9 mg/dL (ref 1.7–2.4)

## 2023-06-27 LAB — CBC WITH DIFFERENTIAL (CANCER CENTER ONLY)
Abs Immature Granulocytes: 0.03 10*3/uL (ref 0.00–0.07)
Basophils Absolute: 0 10*3/uL (ref 0.0–0.1)
Basophils Relative: 1 %
Eosinophils Absolute: 0 10*3/uL (ref 0.0–0.5)
Eosinophils Relative: 1 %
HCT: 32.7 % — ABNORMAL LOW (ref 36.0–46.0)
Hemoglobin: 11.2 g/dL — ABNORMAL LOW (ref 12.0–15.0)
Immature Granulocytes: 1 %
Lymphocytes Relative: 9 %
Lymphs Abs: 0.5 10*3/uL — ABNORMAL LOW (ref 0.7–4.0)
MCH: 30.5 pg (ref 26.0–34.0)
MCHC: 34.3 g/dL (ref 30.0–36.0)
MCV: 89.1 fL (ref 80.0–100.0)
Monocytes Absolute: 0.7 10*3/uL (ref 0.1–1.0)
Monocytes Relative: 14 %
Neutro Abs: 3.9 10*3/uL (ref 1.7–7.7)
Neutrophils Relative %: 74 %
Platelet Count: 282 10*3/uL (ref 150–400)
RBC: 3.67 MIL/uL — ABNORMAL LOW (ref 3.87–5.11)
RDW: 17.4 % — ABNORMAL HIGH (ref 11.5–15.5)
WBC Count: 5.2 10*3/uL (ref 4.0–10.5)
nRBC: 0 % (ref 0.0–0.2)

## 2023-06-27 LAB — PHOSPHORUS: Phosphorus: 4 mg/dL (ref 2.5–4.6)

## 2023-06-27 MED ORDER — LEVETIRACETAM 500 MG PO TABS
500.0000 mg | ORAL_TABLET | Freq: Three times a day (TID) | ORAL | 0 refills | Status: DC
Start: 2023-06-27 — End: 2023-07-26

## 2023-06-27 MED ORDER — SODIUM CHLORIDE 0.9% FLUSH
10.0000 mL | Freq: Once | INTRAVENOUS | Status: AC
  Administered 2023-06-27: 10 mL

## 2023-06-27 MED ORDER — SODIUM CHLORIDE 0.9% FLUSH: 10.0000 mL | Freq: Once | INTRAVENOUS | Status: DC

## 2023-06-27 MED ORDER — HEPARIN SOD (PORK) LOCK FLUSH 100 UNIT/ML IV SOLN: 500.0000 [IU] | Freq: Once | INTRAVENOUS | Status: DC

## 2023-06-27 NOTE — Telephone Encounter (Signed)
Pt advised to continue on K+ 2 times daily instead of once daily. Pt was notified in office and voiced understanding.

## 2023-06-27 NOTE — Telephone Encounter (Signed)
-----   Message from Noreene Filbert sent at 06/27/2023 12:17 PM EDT ----- Is patient taking potassium?  It is low.  Thanks, LC ----- Message ----- From: Interface, [facility] [place] Sent: 06/27/2023  11:02 AM EDT To: Loa Socks, NP

## 2023-06-27 NOTE — Progress Notes (Signed)
[facility] Cancer Follow up:    Colleen Kern, MD [address] [place] [address]   DIAGNOSIS:  Cancer Staging  Malignant neoplasm of upper-outer quadrant of left breast in female, estrogen receptor positive (HCC) Staging form: Breast, AJCC 8th Edition - Clinical stage from 04/07/2020: Stage IIIB (cT4b, cN1, cM0, G2, ER+, PR+, HER2-) - Signed by Serena Croissant, MD on 04/07/2020 Stage prefix: Initial diagnosis Histologic grading system: 3 grade system - Pathologic stage from 09/14/2020: No Stage Recommended (ypT3, pN3a, cM0, G2, ER+, PR+, HER2-) - Unsigned Stage prefix: Post-therapy Histologic grading system: 3 grade system - Pathologic: Stage IV (pM1) - Signed by Loa Socks, NP on 11/20/2022   SUMMARY OF ONCOLOGIC HISTORY: Oncology History  Malignant neoplasm of upper-outer quadrant of left breast in female, estrogen receptor positive (HCC)  03/22/2020 Initial Diagnosis   Diffuse left breast swelling. skin thickening and two areas of irregular hypoechogenicity in the 2-3 o'clock region, 4.9cm, and at least 4 abnormal lymph nodes  with cortical thickening. 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    10/28/2020 - 12/17/2020 Radiation Therapy   The patient initially received a dose of 50.4 Gy in 28 fractions to the left breast and supraclavicular region using whole-breast tangent fields. This was delivered using a 3-D conformal technique. The pt received a boost  delivering an additional 10 Gy in 5 fractions using a electron boost with electrons. The total dose was 60.4 Gy.   11/2020 - 11/2027 Anti-estrogen oral therapy   Anastrozole   01/30/2022 Miscellaneous   Guardant360:ERBB2 (exon 20 insertion): Benefit from Enhertu, MSI high not detected   11/20/2022 Cancer Staging   Staging form: Breast, AJCC 8th Edition - Pathologic: Stage IV (pM1) - Signed by Loa Socks, NP on 11/20/2022   11/20/2022 -  Chemotherapy   Patient is on Treatment Plan : BREAST METASTATIC Sacituzumab govitecan-hziy Drinda Butts) D1,8 q21d       CURRENT THERAPY: Drinda Butts  INTERVAL HISTORY: AMILEY CUCCIA 82 y.o. female returns for f/u prior to receiving [place].  She is experiencing increased fatigue today and is unsure if she wants to continue her treatment.  She says that she struggles with coming in due to her Purel allergy and how it increases her blood pressure.     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   Acid reflux 02/09/2021   Acute gastritis 02/09/2021   Port-A-Cath in place 05/31/2020   Tobacco use 04/16/2020   Malignant neoplasm of upper-outer quadrant of left breast in female, estrogen receptor positive (HCC) 04/07/2020   Hyperlipidemia 10/11/2017   Presence of automatic (implantable) cardiac defibrillator 10/10/2016   Peripheral arterial  disease (HCC) 06/14/2016   Upper airway cough syndrome 06/08/2016   CAD in native artery 05/31/2016   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/2016   VT (ventricular tachycardia) (HCC) 02/09/2016   Acute on chronic combined  systolic and diastolic HF (heart failure) (HCC) 02/09/2016   Contrast media allergy 02/09/2016   Dyspnea 02/07/2016   Benign essential HTN 02/07/2016   Cigarette smoker 02/07/2016   Diabetes (HCC) 08/02/2014   Essential hypertension 08/02/2014    is allergic to alcohol, contrast media [iodinated contrast media], iodine, atorvastatin, dust mite extract, latex, lisinopril, molds & smuts, and wound dressing adhesive.  MEDICAL HISTORY: Past Medical History:  Diagnosis Date   AICD (automatic cardioverter/defibrillator) present    Allergy    Arthritis    FINGERS   Cancer (HCC)    left breast   Cataract    right   Crohn disease (HCC)    Depression    Diabetes mellitus without complication (HCC)    TYPE 2 , TAKING OTC MED FORGYMEMA   GERD (gastroesophageal reflux disease)    Headache    sinus   Heart failure, acute, systolic and diastolic (HCC) MARCH 2017 DX   History of colonic polyps    Hypertension    Hypothyroidism    per patient, takes OTC meds when temperature drops low   IBS (irritable bowel syndrome)    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   Seizures (HCC)    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, 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   Thyroid disease    AGE 6'S UNTIL  2016 OFF ALL THRYOID MEDS NOW    Wears dentures    Wears glasses     SURGICAL HISTORY: Past Surgical History:  Procedure Laterality Date   ABDOMINAL HYSTERECTOMY     PARTIAL   BREAST BIOPSY Left 04/01/2019   INVASIVE DUCTAL CARCINOMA   BREAST SURGERY     CARDIAC CATHETERIZATION  02/10/2016   Procedure: Right/Left Heart Cath and Coronary Angiography;  Surgeon: Marykay Lex, MD;  [facility]VAS[facility] CV LAB;  Service: Cardiovascular;;   CATARACT EXTRACTION W/ INTRAOCULAR LENS IMPLANT     left   COLONOSCOPY WITH PROPOFOL N/A 02/16/2017   Procedure: COLONOSCOPY WITH PROPOFOL;  Surgeon: Jeani Hawking, MD;  Location: [facility] ENDOSCOPY;  Service: Endoscopy;  Laterality: N/A;   COLONOSCOPY WITH PROPOFOL N/A 08/12/2021    Procedure: COLONOSCOPY WITH PROPOFOL;  Surgeon: Jeani Hawking, MD;  Location: [facility];  Service: Endoscopy;  Laterality: N/A;   EP IMPLANTABLE DEVICE N/A 02/10/2016   Procedure: ICD Implant;  Surgeon: Will Jorja Loa, MD;  Location: [facility] INVASIVE CV LAB;  Service: Cardiovascular;  Laterality: N/A;   ESOPHAGOGASTRODUODENOSCOPY (EGD) WITH PROPOFOL N/A 02/16/2017   Procedure: ESOPHAGOGASTRODUODENOSCOPY (EGD) WITH PROPOFOL;  Surgeon: Jeani Hawking, MD;  Location: [facility];  Service: Endoscopy;  Laterality: N/A;  reflux   IR IMAGING GUIDED PORT INSERTION  04/27/2020   IR IMAGING GUIDED PORT INSERTION  11/17/2022   MASTECTOMY Left    MASTECTOMY MODIFIED RADICAL Left 09/14/2020   Procedure: LEFT MASTECTOMY MODIFIED RADICAL;  Surgeon: Harriette Bouillon, MD;  Location: [facility] OR;  Service: General;  Laterality: Left;  PEC BLOCK   POLYPECTOMY  08/12/2021   Procedure: POLYPECTOMY;  Surgeon: Jeani Hawking, MD;  Location: [facility];  Service: Endoscopy;;   TONSILLECTOMY      SOCIAL HISTORY: Social History   Socioeconomic History   Marital status: Widowed    Spouse name: Not on file   Number of children: Not on  file   Years of education: Not on file   Highest education level: Not on file  Occupational History   Not on file  Tobacco Use   Smoking status: Every Day    Current packs/day: 0.25    Average packs/day: 0.3 packs/day for 50.0 years (12.5 ttl pk-yrs)    Types: Cigarettes   Smokeless tobacco: Never   Tobacco comments:    3 puffs of each cigarette  Vaping Use   Vaping status: Never Used  Substance and Sexual Activity   Alcohol use: No   Drug use: No   Sexual activity: Not Currently  Other Topics Concern   Not on file  Social History Narrative   Not on file   Social Determinants of Health   Financial Resource Strain: Not on file  Food Insecurity: No Food Insecurity (03/24/2023)   Hunger Vital Sign    Worried About Running Out of Food in the Last Year: Never true    Ran Out  of Food in the Last Year: Never true  Transportation Needs: No Transportation Needs (03/24/2023)   PRAPARE - Administrator, Civil Service (Medical): No    Lack of Transportation (Non-Medical): No  Physical Activity: Not on file  Stress: Not on file  Social Connections: Not on file  Intimate Partner Violence: Not At Risk (03/24/2023)   Humiliation, Afraid, Rape, and Kick questionnaire    Fear of Current or Ex-Partner: No    Emotionally Abused: No    Physically Abused: No    Sexually Abused: No    FAMILY HISTORY: Family History  Problem Relation Age of Onset   Diabetes Mother    Stroke Mother    Diabetes Brother    Diabetes Maternal Grandmother    Diabetes Paternal Grandfather    Heart failure Paternal Grandfather    Hypertension Brother    Heart attack Neg Hx     Review of Systems  Constitutional:  Positive for fatigue. Negative for appetite change, chills, fever and unexpected weight change.  HENT:   Negative for hearing loss, lump/mass and trouble swallowing.   Eyes:  Negative for eye problems and icterus.  Respiratory:  Negative for chest tightness, cough and shortness of breath.   Cardiovascular:  Negative for chest pain, leg swelling and palpitations.  Gastrointestinal:  Negative for abdominal distention, abdominal pain, constipation, diarrhea, nausea and vomiting.  Endocrine: Negative for hot flashes.  Genitourinary:  Negative for difficulty urinating.   Musculoskeletal:  Negative for arthralgias.  Skin:  Negative for itching and rash.  Neurological:  Negative for dizziness, extremity weakness, headaches and numbness.  Hematological:  Negative for adenopathy. Does not bruise/bleed easily.  Psychiatric/Behavioral:  Negative for depression. The patient is not nervous/anxious.       PHYSICAL EXAMINATION    Vitals:   06/27/23 1058  BP: (!) 190/86  Pulse: 96  Resp: 18  Temp: 97.8 F (36.6 C)  SpO2: 99%    Physical Exam Constitutional:       General: She is not in acute distress.    Appearance: Normal appearance. She is not toxic-appearing.  HENT:     Head: Normocephalic and atraumatic.     Mouth/Throat:     Mouth: Mucous membranes are moist.     Pharynx: Oropharynx is clear. No oropharyngeal exudate or posterior oropharyngeal erythema.  Eyes:     General: No scleral icterus. Cardiovascular:     Rate and Rhythm: Normal rate and regular rhythm.     Pulses: Normal pulses.  Heart sounds: Normal heart sounds.  Pulmonary:     Effort: Pulmonary effort is normal.     Breath sounds: Normal breath sounds.  Abdominal:     General: Abdomen is flat. Bowel sounds are normal. There is no distension.     Palpations: Abdomen is soft.     Tenderness: There is no abdominal tenderness.  Musculoskeletal:        General: No swelling.     Cervical back: Neck supple.  Lymphadenopathy:     Cervical: No cervical adenopathy.  Skin:    General: Skin is warm and dry.     Findings: No rash.  Neurological:     General: No focal deficit present.     Mental Status: She is alert.  Psychiatric:        Mood and Affect: Mood normal.        Behavior: Behavior normal.     LABORATORY DATA:  CBC    Component Value Date/Time   WBC 5.2 06/27/2023 1035   WBC 5.0 03/25/2023 0240   RBC 3.67 (L) 06/27/2023 1035   HGB 11.2 (L) 06/27/2023 1035   HGB 13.9 06/20/2012 1055   HCT 32.7 (L) 06/27/2023 1035   HCT 40.1 06/20/2012 1055   PLT 282 06/27/2023 1035   PLT 308 06/20/2012 1055   MCV 89.1 06/27/2023 1035   MCV 93 06/20/2012 1055   MCH 30.5 06/27/2023 1035   MCHC 34.3 06/27/2023 1035   RDW 17.4 (H) 06/27/2023 1035   RDW 14.3 06/20/2012 1055   LYMPHSABS 0.5 (L) 06/27/2023 1035   LYMPHSABS 2.1 06/20/2012 1055   MONOABS 0.7 06/27/2023 1035   MONOABS 0.6 06/20/2012 1055   EOSABS 0.0 06/27/2023 1035   EOSABS 0.0 06/20/2012 1055   BASOSABS 0.0 06/27/2023 1035   BASOSABS 0.1 06/20/2012 1055    CMP     Component Value Date/Time   NA 139  06/27/2023 1035   NA 141 04/25/2023 1151   NA 141 06/20/2012 1055   K 3.2 (L) 06/27/2023 1035   K 3.6 06/20/2012 1055   CL 105 06/27/2023 1035   CL 109 (H) 06/20/2012 1055   CO2 26 06/27/2023 1035   CO2 25 06/20/2012 1055   GLUCOSE 148 (H) 06/27/2023 1035   GLUCOSE 173 (H) 06/20/2012 1055   BUN 10 06/27/2023 1035   BUN 11 04/25/2023 1151   BUN 12 06/20/2012 1055   CREATININE 0.76 06/27/2023 1035   CREATININE 0.92 05/09/2016 1053   CALCIUM 8.3 (L) 06/27/2023 1035   CALCIUM 9.0 06/20/2012 1055   PROT 6.2 (L) 06/27/2023 1035   PROT 6.6 04/25/2023 1151   ALBUMIN 3.6 06/27/2023 1035   ALBUMIN 4.2 04/25/2023 1151   AST 21 06/27/2023 1035   ALT 53 (H) 06/27/2023 1035   ALKPHOS 91 06/27/2023 1035   BILITOT 0.6 06/27/2023 1035   GFRNONAA >60 06/27/2023 1035   GFRNONAA >60 06/20/2012 1055   GFRAA >60 08/02/2020 0853   GFRAA >60 06/20/2012 1055       PENDING LABS:   RADIOGRAPHIC STUDIES:  No results found.   PATHOLOGY:     ASSESSMENT and THERAPY PLAN:   Malignant neoplasm of upper-outer quadrant of left breast in female, estrogen receptor positive (HCC) 03/22/2020:diffuse left breast swelling. skin thickening and two areas of irregular hypoechogenicity in the 2-3 o'clock region, 4.9cm, and at least 4 abnormal lymph nodes  with cortical thickening. 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  Neoadjuvant chemotherapy with CMF 2.  09/14/2020:Left mastectomy ([name]): invasive and in situ ductal carcinoma, grade 2, 15cm, clear margins, with 14/14 lymph nodes positive for metastatic carcinoma with extracapsular extension.  ER 30%, PR 10%, HER-2 negative, Ki-67 15% 3.  Adjuvant radiation 10/28/2020-12/17/2020 4.  Followed by adjuvant antiestrogen therapy with anastrozole started 01/11/2021 5.   12/07/2021: Liver metastases: Biopsy consistent with metastatic breast cancer ER 0%, PR 0%, HER2 1+ 6.  Capecitabine 1000 mg p.o. twice daily 2 weeks on 1 week off started 01/11/2022-10/31/2022.  Progression Hospitalization 03/23/2023-03/25/2023: Breakthrough seizures, AICD firing and abdominal pain treated with amiodarone, CT abdomen pelvis: Liver metastases 1.1 cm (previously 3.2 cm) portacaval lymph node stable CT CAP 06/01/2023: Hepatic metastases have decreased, T8 compression deformity with sclerosis is indeterminate. ----------------------------------------------------------------------------------------------------------------------- Current treatment: Trodelvy started 11/20/2022  Toxicities: Profound fatigue to her profound fatigue we will not give chemotherapy today or next week.  We will see how this improves her energy level.  She will return in 3 weeks for labs and follow-up with Dr. Pamelia Hoit to discuss treatment options and whether she wants to continue on treatment moving forward.  The above with Dr. Pamelia Hoit in detail who is in agreement with the plan. Return to clinic in 3 weeks   All questions were answered. The patient knows to call the clinic with any problems, questions or concerns. We can certainly see the patient much sooner if necessary.  Total encounter time:20 minutes*in face-to-face visit time, chart review, lab review, care coordination, order entry, and documentation of the encounter time.  Lillard Anes, NP 06/29/23 4:01 PM Medical Oncology and Hematology [facility] [address] [place], [place] [address] Tel. [phone]    Fax. [phone]  *Total Encounter Time as defined by the Centers for Medicare and Medicaid Services includes, in addition to the face-to-face time of a patient visit (documented in the note above) non-face-to-face time: obtaining and reviewing outside history, ordering and reviewing medications, tests or procedures, care  coordination (communications with other health care professionals or caregivers) and documentation in the medical record.

## 2023-06-27 NOTE — Assessment & Plan Note (Addendum)
03/22/2020:diffuse left breast swelling. skin thickening and two areas of irregular hypoechogenicity in the 2-3 o'clock region, 4.9cm, and at least 4 abnormal lymph nodes  with cortical thickening. 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  Neoadjuvant chemotherapy with CMF 2.  09/14/2020:Left mastectomy ([name]): invasive and in situ ductal carcinoma, grade 2, 15cm, clear margins, with 14/14 lymph nodes positive for metastatic carcinoma with extracapsular extension.  ER 30%, PR 10%, HER-2 negative, Ki-67 15% 3.  Adjuvant radiation 10/28/2020-12/17/2020 4.  Followed by adjuvant antiestrogen therapy with anastrozole started 01/11/2021 5.  12/07/2021: Liver metastases: Biopsy consistent with metastatic breast cancer ER 0%, PR 0%, HER2 1+ 6.  Capecitabine 1000 mg p.o. twice daily 2 weeks on 1 week off started 01/11/2022-10/31/2022.  Progression Hospitalization 03/23/2023-03/25/2023: Breakthrough seizures, AICD firing and abdominal pain treated with amiodarone, CT abdomen pelvis: Liver metastases 1.1 cm (previously 3.2 cm) portacaval lymph node stable CT CAP 06/01/2023: Hepatic metastases have decreased, T8 compression deformity with sclerosis is indeterminate. ----------------------------------------------------------------------------------------------------------------------- Current treatment: Trodelvy started 11/20/2022  Toxicities: Profound fatigue to her profound fatigue we will not give chemotherapy today or next week.  We will see how this improves her energy level.  She will return in 3 weeks for labs and follow-up with Dr. Pamelia Hoit to discuss treatment options and whether she wants to continue on treatment moving forward.  The above with Dr. Pamelia Hoit in detail who is in agreement with the plan. Return to clinic in 3  weeks

## 2023-06-28 ENCOUNTER — Telehealth: Payer: Self-pay | Admitting: Adult Health

## 2023-06-28 NOTE — Telephone Encounter (Signed)
Cancelled appointments per 8/14 los. Patient is aware of the changes made to her upcoming appointments.

## 2023-06-29 ENCOUNTER — Encounter: Payer: Self-pay | Admitting: Hematology and Oncology

## 2023-07-04 ENCOUNTER — Ambulatory Visit: Payer: Medicare HMO

## 2023-07-04 ENCOUNTER — Other Ambulatory Visit: Payer: Medicare HMO

## 2023-07-05 ENCOUNTER — Other Ambulatory Visit: Payer: Self-pay | Admitting: Hematology and Oncology

## 2023-07-05 DIAGNOSIS — C50412 Malignant neoplasm of upper-outer quadrant of left female breast: Secondary | ICD-10-CM

## 2023-07-05 NOTE — Telephone Encounter (Signed)
Duplicate order.

## 2023-07-17 MED FILL — Dexamethasone Sodium Phosphate Inj 100 MG/10ML: INTRAMUSCULAR | Qty: 1 | Status: AC

## 2023-07-17 MED FILL — Fosaprepitant Dimeglumine For IV Infusion 150 MG (Base Eq): INTRAVENOUS | Qty: 5 | Status: AC

## 2023-07-17 NOTE — Progress Notes (Deleted)
Palliative Medicine [facility]  [phone] [phone]   Name: [name] Date: 07/17/2023 MRN: [id number]  DOB: 1941-09-15  Patient Care Team: Dortha Kern, MD as PCP - General (Family Medicine) Chilton Si, MD as PCP - Cardiology (Cardiology) Regan Lemming, MD as PCP - Electrophysiology (Cardiology) Dorothy Puffer, MD as Consulting Physician (Radiation Oncology) Serena Croissant, MD as Consulting Physician (Hematology and Oncology) Harriette Bouillon, MD as Consulting Physician (General Surgery) Windell Norfolk, MD as Consulting Physician (Neurology)    INTERVAL HISTORY: Colleen Lowery is a 82 y.o. 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 Past medical history also includes seizures, diabetes, and HTN. Palliative ask to see for symptom management and goals of care.   SOCIAL HISTORY:     reports that she has been smoking cigarettes. She has a 12.5 pack-year smoking history. She has never used smokeless tobacco. She reports that she does not drink alcohol and does not use drugs.  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 , TAKING OTC MED FORGYMEMA   GERD (gastroesophageal reflux disease)    Headache    sinus   Heart failure, acute, systolic and diastolic (HCC) MARCH 2017 DX   History of colonic polyps    Hypertension    Hypothyroidism    per patient, takes OTC meds when temperature drops low   IBS (irritable bowel syndrome)    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    Seizures (HCC)    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  MEDICATIONS:  Current Outpatient Medications  Medication Sig Dispense Refill   alendronate (FOSAMAX) 70 MG tablet Take 1 tablet (70 mg total) by mouth once a week. Take with a full glass of water on an empty stomach. 12 tablet 3   amiodarone (PACERONE) 400 MG tablet Take 1 tablet (400 mg total) by mouth daily. 90 tablet 3   apixaban (ELIQUIS) 5 MG TABS tablet Take 1 tablet (5 mg total) by mouth 2 (two) times daily. For 3 months 60 tablet 3   furosemide (LASIX) 20 MG tablet Take 20 mg by mouth daily.     levETIRAcetam (KEPPRA) 500 MG tablet Take 1 tablet (500 mg total) by mouth 3 (three) times daily. 90 tablet 0   lidocaine-prilocaine (EMLA) cream Apply to affected area once (Patient taking differently: Apply 1 Application topically daily as needed (For pain).) 30 g 3   liothyronine (CYTOMEL) 25 MCG tablet Take 25 mcg by mouth daily.     metoprolol succinate (TOPROL-XL) 100 MG 24 hr tablet Take 1 tablet (100 mg total) by mouth daily. Take with or immediately following a meal. 30  tablet 6   polyethylene glycol powder (GLYCOLAX/MIRALAX) 17 GM/SCOOP powder Take 17 g by mouth 2 (two) times daily. 255 g 0   potassium chloride SA (K-DUR,KLOR-CON) 20 MEQ tablet Take 20 mEq by mouth 2 (two) times daily.     PROLENSA 0.07 % SOLN Place 1 drop into the right eye 4 (four) times daily.     Propylene Glycol (SYSTANE BALANCE) 0.6 % SOLN Place 2 drops into the right eye in the morning and at bedtime.     sacubitril-valsartan (ENTRESTO) 97-103 MG Take 1 tablet by mouth 2 (two) times daily. 60 tablet 5   TRUE METRIX BLOOD GLUCOSE TEST test strip SMARTSIG:Via  Meter     TRUEplus Lancets 33G MISC      No current facility-administered medications for this visit.    VITAL SIGNS: There were no vitals taken for this visit. There were no vitals filed for this visit.  Estimated body mass index is 24.97 kg/m as calculated from the following:   Height as of 06/27/23: 5\' 2"  (1.575 m).   Weight as of 06/27/23: 136 lb 8 oz (61.9 kg).   PERFORMANCE STATUS (ECOG) : 1 - Symptomatic but completely ambulatory   Physical Exam General: NAD Cardiovascular: regular rate and rhythm Pulmonary: clear ant fields Abdomen: soft, nontender, + bowel sounds,  kin: no rashes Neurological: A/O x 4,   IMPRESSION:   Anxiety: Ms.Ledbetter continues to experience anxiety related to going out in public. She is afraid of coming into contact with strong odors, especially Purell hand sanitizer - that might trigger more seizures. States coming to the cancer center is like coming into a "chemical land mind" which she contributes to feeling poorly.   Emotional supports provided and encouragement to continue utilizing the Valium prior to appointments to help decrease anxiety.     2. Goals of Care  06/27/23- I created space and opportunity for [name] to express her thoughts and feelings. She is emotional expressing the importance of her quality of life over quantity. Speaks to her cancer being metastatic and awareness that current treatment is keeping "things" stable with some shrinkage while also realistically understanding that it is not curative. Shares that if she chose not to continue with treatment she would focus on doing things that she need to with hopes of not feeling so poorly. Expresses by the time she feels a little better it is time for treatment again and start the cycle over again. Emotional support provided.   We discussed making informed decisions while also focusing on what is most important for her and her wishes. She is agreeable in plans to allow some time to  think about what it is she would want for herself and if she would want to pursue additional treatments in the future. Plans to discuss further with Oncologist at follow-up visit at next visit. States she may be in a different mindset at that time or she may remain persistent on not resuming treatment depending on how she feels over the next 3 weeks.   We discussed her current illness and what it means in the larger context of Her on-going co-morbidities. Natural disease trajectory and expectations were discussed. We discussed the importance of continued conversation with family and their medical providers regarding overall plan of care and treatment options, ensuring decisions are within the context of the patients values and GOCs.  3/12- Ms. Mehlman completed HCPOA paperwork listing her brother Juliene Pina as her primary decision maker in the event she is unable  to speak for herself.  Ms. Bonito remains clear in expressed wishes to continue to treat the the treatable allowing her every opportunity to continue to thrive. Taking life one day at a time. She is encouraged to notify the palliative care office for any symptoms that may arise, such as pain.    PLAN: Monitor use and efficacy of Valium in the management of anxiety. Encourage reporting of any symptoms that may arise. Continue goals of care conversations regarding patient's wishes in the context of her disease process. Palliative will plan to see patient back in 3-4 weeks in collaboration to other oncology appointments.    Patient expressed understanding and was in agreement with this plan. She also understands that She can call the clinic at any time with any questions, concerns, or complaints.   Any controlled substances utilized were prescribed in the context of palliative care. PDMP has been reviewed.    Visit consisted of counseling and education dealing with the complex and emotionally intense issues of symptom management and palliative  care in the setting of serious and potentially life-threatening illness.Greater than 50%  of this time was spent counseling and coordinating care related to the above assessment and plan.  Willette Alma, AGPCNP-BC  Palliative Medicine Team/[facility]  *Please note that this is a verbal dictation therefore any spelling or grammatical errors are due to the "[employer]agon [employer] One" system interpretation.

## 2023-07-18 ENCOUNTER — Inpatient Hospital Stay: Payer: Medicare HMO | Admitting: Hematology and Oncology

## 2023-07-18 ENCOUNTER — Inpatient Hospital Stay: Payer: Medicare HMO | Admitting: Nurse Practitioner

## 2023-07-18 ENCOUNTER — Telehealth: Payer: Self-pay | Admitting: *Deleted

## 2023-07-18 ENCOUNTER — Inpatient Hospital Stay: Payer: Medicare HMO

## 2023-07-18 NOTE — Telephone Encounter (Signed)
This RN called pt per noted appt with MD and Palliative ( chemo too) with no show -  Noted per prior visit pt requested a treatment break with hold on treatment 3 weeks ago and today- with request that pt see the MD as scheduled today.  Pt thought all appts were canceled.  She has no issues at present, no new issues and no refills are needed at this time.  Per MD review- pt to keep scheduled appts on 08/08/2023- reviewed date and time with pt who verbalized agreement.

## 2023-07-18 NOTE — Assessment & Plan Note (Deleted)
03/22/2020:diffuse left breast swelling. skin thickening and two areas of irregular hypoechogenicity in the 2-3 o'clock region, 4.9cm, and at least 4 abnormal lymph nodes  with cortical thickening. 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  Neoadjuvant chemotherapy with CMF 2.  09/14/2020:Left mastectomy ([name]): invasive and in situ ductal carcinoma, grade 2, 15cm, clear margins, with 14/14 lymph nodes positive for metastatic carcinoma with extracapsular extension.  ER 30%, PR 10%, HER-2 negative, Ki-67 15% 3.  Adjuvant radiation 10/28/2020-12/17/2020 4.  Followed by adjuvant antiestrogen therapy with anastrozole started 01/11/2021 5.  12/07/2021: Liver metastases: Biopsy consistent with metastatic breast cancer ER 0%, PR 0%, HER2 1+ 6.  Capecitabine 1000 mg p.o. twice daily 2 weeks on 1 week off started 01/11/2022-10/31/2022.  Progression Hospitalization 03/23/2023-03/25/2023: Breakthrough seizures, AICD firing and abdominal pain treated with amiodarone, CT abdomen pelvis: Liver metastases 1.1 cm (previously 3.2 cm) portacaval lymph node stable CT CAP 06/01/2023: Hepatic metastases have decreased, T8 compression deformity with sclerosis is indeterminate. ----------------------------------------------------------------------------------------------------------------------- Current treatment: Trodelvy started 11/20/2022   Toxicities:  Return to clinic every 3 weeks for treatment follow-up

## 2023-07-19 ENCOUNTER — Encounter: Payer: Self-pay | Admitting: Hematology and Oncology

## 2023-07-25 ENCOUNTER — Ambulatory Visit: Payer: Medicare HMO

## 2023-07-25 ENCOUNTER — Other Ambulatory Visit: Payer: Medicare HMO

## 2023-07-26 ENCOUNTER — Encounter: Payer: Self-pay | Admitting: Neurology

## 2023-07-26 ENCOUNTER — Ambulatory Visit: Payer: Medicare HMO | Admitting: Neurology

## 2023-07-26 DIAGNOSIS — C50412 Malignant neoplasm of upper-outer quadrant of left female breast: Secondary | ICD-10-CM

## 2023-07-26 DIAGNOSIS — R569 Unspecified convulsions: Secondary | ICD-10-CM

## 2023-07-26 DIAGNOSIS — Z170 Estrogen receptor positive status [ER+]: Secondary | ICD-10-CM | POA: Diagnosis not present

## 2023-07-26 MED ORDER — LEVETIRACETAM 500 MG PO TABS
500.0000 mg | ORAL_TABLET | Freq: Three times a day (TID) | ORAL | 3 refills | Status: DC
Start: 2023-07-26 — End: 2024-01-20

## 2023-07-26 NOTE — Progress Notes (Signed)
[facility]  PATIENT: Colleen Lowery DOB: 02/07/1941  REQUESTING CLINICIAN: Dortha Kern, MD HISTORY FROM: Patient  REASON FOR VISIT: Seizure disorder    HISTORICAL  CHIEF COMPLAINT:  Chief Complaint  Patient presents with   Seizures    Rm12, alone. Last sz was 9 months ago, pt stated that she quit cancer treatments she wants to make sure that is ok w/Dr. Teresa Coombs   INTERVAL HISTORY 07/26/2023 Patient presents today for follow-up, last visit was in March, at that time she was taking Keppra 500 mg twice daily.  Her level was normal.  In May after getting treatment for her cancer, she did have another seizure in the parking lot of the hospital, she was taken to the ED, upon discharge her Keppra increased to 500 TID. She is compliant with the medication, denies any side effect.  In terms of her cancer treatment, she has not gone back to get her treatment for the past few months.  She does state that going to the hospital is difficult for her, with her seizures triggered by Purell smell and other smells and chemical in the hospital, it is very challenging.  She reports that all of her seizures happen in the hospital after getting her cancer treatment, does not recall any seizures at home.  She does have a appointment with her oncology team scheduled for September 26 and she will continue to discuss her options.  She also has noted a new tremor involving the right hand, mainly the right fifth finger and first 3 digits numbness. No pain    HISTORY OF PRESENT ILLNESS:  This is a 82 year old woman past medical history of breast cancer undergoing chemotherapy, hypertension, hyperlipidemia, diabetes and mellitus, who is presenting after a seizure.  Patient reports she had a last seizure on February 6.  This was after getting chemotherapy as she was going home she had a witnessed generalized tonic clonic seizure in her parking lot. She lost consciousness and have a full generalized  tonic-clonic seizure.  She was taken to the hospital. She recall waking up in the hospital. This was her second seizure in a week.  Her other seizure was on January 29 while she was getting chemotherapy.  Due to her second seizure, patient was started on Keppra. Head CT was done, did not show any brain metastasis or any bleed. She was started on Keppra 500 mg twice daily.  Since taking the Keppra, she denies any seizure or seizure activity.  She reports compliance with the medicine. Off note she report back in 2021 she also had seizures and was put on Keppra.  She did well and the medication was discontinued.   Handedness: Right handed   Onset: January 2023  Seizure Type: Generalized convulsion,   Current frequency: Last one May 8 after cancer treatment   Any injuries from seizures: Denies   Seizure risk factors: Nonre reported   Previous ASMs: Levetiracetam   Currenty ASMs: Levetiracetam 500 mg three times daily   ASMs side effects: Denies   Brain Images: no acute abnormalities (CT head 12/11/22)   Previous EEGs: None done    OTHER MEDICAL CONDITIONS: Breast cancer, hypertension, hyperlipidemia, diabetes mellitus, seizures   REVIEW OF SYSTEMS: Full 14 system review of systems performed and negative with exception of: as noted in the HPI   ALLERGIES: Allergies  Allergen Reactions   Alcohol Anaphylaxis and Swelling    Purell hand sanitizer  Swelling in the luNGS, CHEST AND THROAT and  seizures Problem with smell not the Alcohol Other reaction(s): Other (See Comments) Causes seizures and swelling   Contrast Media [Iodinated Contrast Media] Anaphylaxis    Throat closed, was 40 yrs ago  *Iodine    Iodine Anaphylaxis    Throat closed, was 40 yrs ago   Atorvastatin     Myalgia    Dust Mite Extract Swelling    SWELLING IS GI SYSTEM   Latex     QUESTIONABLE INTOLERABLE MUCOUS OOZING OUT OF THROAT   Lisinopril Other (See Comments)    Pt does not remember reaction    Molds  & Smuts Swelling    SWELLING IS IN GI SYSTEM   Wound Dressing Adhesive Other (See Comments)    Causes skin to tear    HOME MEDICATIONS: Outpatient Medications Prior to Visit  Medication Sig Dispense Refill   alendronate (FOSAMAX) 70 MG tablet Take 1 tablet (70 mg total) by mouth once a week. Take with a full glass of water on an empty stomach. 12 tablet 3   amiodarone (PACERONE) 400 MG tablet Take 1 tablet (400 mg total) by mouth daily. 90 tablet 3   apixaban (ELIQUIS) 5 MG TABS tablet Take 1 tablet (5 mg total) by mouth 2 (two) times daily. For 3 months 60 tablet 3   furosemide (LASIX) 20 MG tablet Take 20 mg by mouth daily.     lidocaine-prilocaine (EMLA) cream Apply to affected area once (Patient taking differently: Apply 1 Application topically daily as needed (For pain).) 30 g 3   liothyronine (CYTOMEL) 25 MCG tablet Take 25 mcg by mouth daily.     metoprolol succinate (TOPROL-XL) 100 MG 24 hr tablet Take 1 tablet (100 mg total) by mouth daily. Take with or immediately following a meal. 30 tablet 6   polyethylene glycol powder (GLYCOLAX/MIRALAX) 17 GM/SCOOP powder Take 17 g by mouth 2 (two) times daily. 255 g 0   potassium chloride SA (K-DUR,KLOR-CON) 20 MEQ tablet Take 20 mEq by mouth 2 (two) times daily.     PROLENSA 0.07 % SOLN Place 1 drop into the right eye 4 (four) times daily.     Propylene Glycol (SYSTANE BALANCE) 0.6 % SOLN Place 2 drops into the right eye in the morning and at bedtime.     sacubitril-valsartan (ENTRESTO) 97-103 MG Take 1 tablet by mouth 2 (two) times daily. 60 tablet 5   TRUE METRIX BLOOD GLUCOSE TEST test strip SMARTSIG:Via Meter     TRUEplus Lancets 33G MISC      levETIRAcetam (KEPPRA) 500 MG tablet Take 1 tablet (500 mg total) by mouth 3 (three) times daily. 90 tablet 0   No facility-administered medications prior to visit.    PAST MEDICAL HISTORY: Past Medical History:  Diagnosis Date   AICD (automatic cardioverter/defibrillator) present    Allergy     Arthritis    FINGERS   Cancer (HCC)    left breast   Cataract    right   Crohn disease (HCC)    Depression    Diabetes mellitus without complication (HCC)    TYPE 2 , TAKING OTC MED FORGYMEMA   GERD (gastroesophageal reflux disease)    Headache    sinus   Heart failure, acute, systolic and diastolic (HCC) MARCH 2017 DX   History of colonic polyps    Hypertension    Hypothyroidism    per patient, takes OTC meds when temperature drops low   IBS (irritable bowel syndrome)    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)  11/20/2022   Seizures (HCC)    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, 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   Thyroid disease    AGE 66'S UNTIL  2016 OFF ALL THRYOID MEDS NOW    Wears dentures    Wears glasses     PAST SURGICAL HISTORY: Past Surgical History:  Procedure Laterality Date   ABDOMINAL HYSTERECTOMY     PARTIAL   BREAST BIOPSY Left 04/01/2019   INVASIVE DUCTAL CARCINOMA   BREAST SURGERY     CARDIAC CATHETERIZATION  02/10/2016   Procedure: Right/Left Heart Cath and Coronary Angiography;  Surgeon: Marykay Lex, MD;  Location: [facility] INVASIVE CV LAB;  Service: Cardiovascular;;   CATARACT EXTRACTION W/ INTRAOCULAR LENS IMPLANT     left   COLONOSCOPY WITH PROPOFOL N/A 02/16/2017   Procedure: COLONOSCOPY WITH PROPOFOL;  Surgeon: Jeani Hawking, MD;  Location: [facility] ENDOSCOPY;  Service: Endoscopy;  Laterality: N/A;   COLONOSCOPY WITH PROPOFOL N/A 08/12/2021   Procedure: COLONOSCOPY WITH PROPOFOL;  Surgeon: Jeani Hawking, MD;  Location: [facility] ENDOSCOPY;  Service: Endoscopy;  Laterality: N/A;   EP IMPLANTABLE DEVICE N/A 02/10/2016   Procedure: ICD Implant;  Surgeon: Will Jorja Loa, MD;  Location: [facility] INVASIVE CV LAB;  Service: Cardiovascular;  Laterality: N/A;   ESOPHAGOGASTRODUODENOSCOPY (EGD) WITH PROPOFOL N/A 02/16/2017   Procedure: ESOPHAGOGASTRODUODENOSCOPY (EGD) WITH PROPOFOL;  Surgeon: Jeani Hawking, MD;  Location: [facility] ENDOSCOPY;  Service: Endoscopy;  Laterality: N/A;  reflux   IR IMAGING GUIDED  PORT INSERTION  04/27/2020   IR IMAGING GUIDED PORT INSERTION  11/17/2022   MASTECTOMY Left    MASTECTOMY MODIFIED RADICAL Left 09/14/2020   Procedure: LEFT MASTECTOMY MODIFIED RADICAL;  Surgeon: Harriette Bouillon, MD;  Location: [facility] OR;  Service: General;  Laterality: Left;  PEC BLOCK   POLYPECTOMY  08/12/2021   Procedure: POLYPECTOMY;  Surgeon: Jeani Hawking, MD;  Location: [facility] ENDOSCOPY;  Service: Endoscopy;;   TONSILLECTOMY      FAMILY HISTORY: Family History  Problem Relation Age of Onset   Diabetes Mother    Stroke Mother    Diabetes Brother    Diabetes Maternal Grandmother    Diabetes Paternal Grandfather    Heart failure Paternal Grandfather    Hypertension Brother    Heart attack Neg Hx     SOCIAL HISTORY: Social History   Socioeconomic History   Marital status: Widowed    Spouse name: Not on file   Number of children: Not on file   Years of education: Not on file   Highest education level: Not on file  Occupational History   Not on file  Tobacco Use   Smoking status: Every Day    Current packs/day: 0.25    Average packs/day: 0.3 packs/day for 50.0 years (12.5 ttl pk-yrs)    Types: Cigarettes   Smokeless tobacco: Never   Tobacco comments:    3 puffs of each cigarette  Vaping Use   Vaping status: Never Used  Substance and Sexual Activity   Alcohol use: No   Drug use: No   Sexual activity: Not Currently  Other Topics Concern   Not on file  Social History Narrative   Not on file   Social Determinants of Health   Financial Resource Strain: Not on file  Food Insecurity: No Food Insecurity (03/24/2023)   Hunger Vital Sign    Worried About Running Out of Food in the Last Year: Never true    Ran Out of Food in the Last Year: Never true  Transportation Needs: No Transportation Needs (03/24/2023)  PRAPARE - Administrator, Civil Service (Medical): No    Lack of Transportation (Non-Medical): No  Physical Activity: Not on file  Stress: Not on file   Social Connections: Not on file  Intimate Partner Violence: Not At Risk (03/24/2023)   Humiliation, Afraid, Rape, and Kick questionnaire    Fear of Current or Ex-Partner: No    Emotionally Abused: No    Physically Abused: No    Sexually Abused: No     PHYSICAL EXAM   GENERAL EXAM/CONSTITUTIONAL: Vitals:  Vitals:   07/26/23 1427 07/26/23 1456  BP: (!) 212/98 (!) 200/93  Pulse: (!) 103   Weight: 128 lb 8 oz (58.3 kg)   Height: 5\' 2"  (1.575 m)    Body mass index is 23.5 kg/m. Wt Readings from Last 3 Encounters:  07/26/23 128 lb 8 oz (58.3 kg)  06/27/23 136 lb 8 oz (61.9 kg)  06/27/23 136 lb (61.7 kg)   Patient is in no distress; well developed, nourished and groomed; neck is supple  MUSCULOSKELETAL: Gait, strength, tone, movements noted in Neurologic exam below  NEUROLOGIC: MENTAL STATUS:      No data to display         awake, alert, oriented to person, place and time recent and remote memory intact normal attention and concentration language fluent, comprehension intact, naming intact fund of knowledge appropriate  CRANIAL NERVE:  2nd, 3rd, 4th, 6th - Visual fields full to confrontation, extraocular muscles intact, no nystagmus 5th - facial sensation symmetric 7th - facial strength symmetric 8th - hearing intact 9th - palate elevates symmetrically, uvula midline 11th - shoulder shrug symmetric 12th - tongue protrusion midline  MOTOR:  normal bulk and tone, full strength in the BUE, BLE  SENSORY:  normal and symmetric to light touch  COORDINATION:  finger-nose-finger, fine finger movements normal  GAIT/STATION:  Walks with a cane    DIAGNOSTIC DATA (LABS, IMAGING, TESTING) - I reviewed patient records, labs, notes, testing and imaging myself where available.  Lab Results  Component Value Date   WBC 5.2 06/27/2023   HGB 11.2 (L) 06/27/2023   HCT 32.7 (L) 06/27/2023   MCV 89.1 06/27/2023   PLT 282 06/27/2023      Component Value Date/Time    NA 139 06/27/2023 1035   NA 141 04/25/2023 1151   NA 141 06/20/2012 1055   K 3.2 (L) 06/27/2023 1035   K 3.6 06/20/2012 1055   CL 105 06/27/2023 1035   CL 109 (H) 06/20/2012 1055   CO2 26 06/27/2023 1035   CO2 25 06/20/2012 1055   GLUCOSE 148 (H) 06/27/2023 1035   GLUCOSE 173 (H) 06/20/2012 1055   BUN 10 06/27/2023 1035   BUN 11 04/25/2023 1151   BUN 12 06/20/2012 1055   CREATININE 0.76 06/27/2023 1035   CREATININE 0.92 05/09/2016 1053   CALCIUM 8.3 (L) 06/27/2023 1035   CALCIUM 9.0 06/20/2012 1055   PROT 6.2 (L) 06/27/2023 1035   PROT 6.6 04/25/2023 1151   ALBUMIN 3.6 06/27/2023 1035   ALBUMIN 4.2 04/25/2023 1151   AST 21 06/27/2023 1035   ALT 53 (H) 06/27/2023 1035   ALKPHOS 91 06/27/2023 1035   BILITOT 0.6 06/27/2023 1035   GFRNONAA >60 06/27/2023 1035   GFRNONAA >60 06/20/2012 1055   GFRAA >60 08/02/2020 0853   GFRAA >60 06/20/2012 1055   Lab Results  Component Value Date   CHOL 181 03/24/2023   HDL 42 03/24/2023   LDLCALC 119 (H) 03/24/2023  TRIG 101 03/24/2023   Lab Results  Component Value Date   HGBA1C 7.3 (H) 03/21/2023   No results found for: "VITAMINB12" Lab Results  Component Value Date   TSH 0.268 (L) 04/26/2023    Head CT 12/11/2022 1. No acute intracranial process. 2. Right maxillary sinus disease  EEG 02/01/2023:  Normal   EEG 03/22/2023 Normal    ASSESSMENT AND PLAN  82 y.o. 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  Since last visit, she did have a breakthrough seizure after receiving chemotherapy, this was in the parking lot of the hospital.  This was on May 8.  Her Keppra was increased to 500 mg 3 times daily.  She tolerates the increased medication well.  Plan for now is to continue patient on Keppra 500 3 times daily.   When it comes to her breast cancer and treatment, she has not gone back to treatment for  past few months.  I strongly advised her to follow-up with her oncology team to discuss her options.  She reports that she has an appointment scheduled for September 26.  Again advised her to contact me if she does have a another seizure otherwise I will see her in 9-months or sooner if worse.    1. Malignant neoplasm of upper-outer quadrant of left breast in female, estrogen receptor positive (HCC)   2. Seizure (HCC)      Patient Instructions  Continue with Keppra 500 mg 3 times daily, refill given Continue your other medications Please do follow-up with your oncology team on September 26 and discuss your options for treatment. Follow-up in 1 year or sooner if worse.   Per [facility] statutes, patients with seizures are not allowed to drive until they have been seizure-free for six months.  Other recommendations include using caution when using heavy equipment or power tools. Avoid working on ladders or at heights. Take showers instead of baths.  Do not swim alone.  Ensure the water temperature is not too high on the home water heater. Do not go swimming alone. Do not lock yourself in a room alone (i.e. bathroom). When caring for infants or small children, sit down when holding, feeding, or changing them to minimize risk of injury to the child in the event you have a seizure. Maintain good sleep hygiene. Avoid alcohol.  Also recommend adequate sleep, hydration, good diet and minimize stress.   During the Seizure  - First, ensure adequate ventilation and place patients on the floor on their left side  Loosen clothing around the neck and ensure the airway is patent. If the patient is clenching the teeth, do not force the mouth open with any object as this can cause severe damage - Remove all items from the surrounding that can be hazardous. The patient may be oblivious to what's happening and may not even know what he or she is doing. If the patient is confused and wandering, either  gently guide him/her away and block access to outside areas - Reassure the individual and be comforting - Call 911. In most cases, the seizure ends before EMS arrives. However, there are cases when seizures may last over 3 to 5 minutes. Or the individual may have developed breathing difficulties or severe injuries. If a pregnant patient or a person with diabetes develops a seizure, it is prudent to call an ambulance. - Finally, if the patient  does not regain full consciousness, then call EMS. Most patients will remain confused for about 45 to 90 minutes after a seizure, so you must use judgment in calling for help. - Avoid restraints but make sure the patient is in a bed with padded side rails - Place the individual in a lateral position with the neck slightly flexed; this will help the saliva drain from the mouth and prevent the tongue from falling backward - Remove all nearby furniture and other hazards from the area - Provide verbal assurance as the individual is regaining consciousness - Provide the patient with privacy if possible - Call for help and start treatment as ordered by the caregiver   After the Seizure (Postictal Stage)  After a seizure, most patients experience confusion, fatigue, muscle pain and/or a headache. Thus, one should permit the individual to sleep. For the next few days, reassurance is essential. Being calm and helping reorient the person is also of importance.  Most seizures are painless and end spontaneously. Seizures are not harmful to others but can lead to complications such as stress on the lungs, brain and the heart. Individuals with prior lung problems may develop labored breathing and respiratory distress.     No orders of the defined types were placed in this encounter.   Meds ordered this encounter  Medications   levETIRAcetam (KEPPRA) 500 MG tablet    Sig: Take 1 tablet (500 mg total) by mouth 3 (three) times daily.    Dispense:  270 tablet    Refill:   3    Return in about 1 year (around 07/25/2024).    Windell Norfolk, MD 07/26/2023, 4:59 PM  [facility] [address], [address] [place], [place] [address] [phone]

## 2023-07-26 NOTE — Patient Instructions (Signed)
Continue with Keppra 500 mg 3 times daily, refill given Continue your other medications Please do follow-up with your oncology team on September 26 and discuss your options for treatment. Follow-up in 1 year or sooner if worse.

## 2023-08-03 ENCOUNTER — Telehealth: Payer: Self-pay | Admitting: Neurology

## 2023-08-03 NOTE — Telephone Encounter (Signed)
FYI:Pt said did not include information during office visit on 07/15/23. Want to add information to past history to the chart; wanted the neurologist to know early 2024 that seizure in the parking lot after being around [employer] in the grocery store.

## 2023-08-06 NOTE — Progress Notes (Deleted)
Palliative Medicine [facility]  [phone] [phone]   Name: KRYMSON JAKOBSEN Date: 08/06/2023 MRN: [id number]  DOB: 1941-11-01  Patient Care Team: Dortha Kern, MD as PCP - General (Family Medicine) Chilton Si, MD as PCP - Cardiology (Cardiology) Regan Lemming, MD as PCP - Electrophysiology (Cardiology) Dorothy Puffer, MD as Consulting Physician (Radiation Oncology) Serena Croissant, MD as Consulting Physician (Hematology and Oncology) Harriette Bouillon, MD as Consulting Physician (General Surgery) Windell Norfolk, MD as Consulting Physician (Neurology)    INTERVAL HISTORY: [name] is a 82 y.o. 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 Past medical history also includes seizures, diabetes, and HTN. Palliative ask to see for symptom management and goals of care.   SOCIAL HISTORY:     reports that she has been smoking cigarettes. She has a 12.5 pack-year smoking history. She has never used smokeless tobacco. She reports that she does not drink alcohol and does not use drugs.  ADVANCE DIRECTIVES:  DNR/MOST on file  CODE STATUS: DNR  PAST MEDICAL HISTORY: Past Medical History:  Diagnosis Date  . AICD (automatic cardioverter/defibrillator) present   . Allergy   . Arthritis    FINGERS  . Cancer [facility])    left breast  . Cataract    right  . Crohn disease (HCC)   . Depression   . Diabetes mellitus without complication (HCC)    TYPE 2 , TAKING OTC MED FORGYMEMA  . GERD (gastroesophageal reflux disease)   . Headache    sinus  . Heart failure, acute, systolic and diastolic (HCC) MARCH 2017 DX  . History of colonic polyps   . Hypertension   . Hypothyroidism    per patient, takes OTC meds when temperature drops low  . IBS (irritable bowel syndrome)   . 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 Thyroid disease    AGE 29'S UNTIL  2016 OFF ALL THRYOID MEDS NOW   . Wears dentures   . Wears glasses     ALLERGIES:  is allergic to alcohol, contrast media [iodinated contrast media], iodine, atorvastatin, dust mite extract, latex, lisinopril, molds & smuts, and wound dressing adhesive.  MEDICATIONS:  Current Outpatient Medications  Medication Sig Dispense Refill  . alendronate (FOSAMAX) 70 MG tablet Take 1 tablet (70 mg total) by mouth once a week. Take with a full glass of water on an empty stomach. 12 tablet 3  . amiodarone (PACERONE) 400 MG tablet Take 1 tablet (400 mg total) by mouth daily. 90 tablet 3  . apixaban (ELIQUIS) 5 MG TABS tablet Take 1 tablet (5 mg total) by mouth 2 (two) times daily. For 3 months 60 tablet 3  . furosemide (LASIX) 20 MG tablet Take 20 mg by mouth daily.    [name] levETIRAcetam (KEPPRA) 500 MG tablet Take 1 tablet (500 mg total) by mouth 3 (three) times daily. 270 tablet 3  . lidocaine-prilocaine (EMLA) cream Apply to affected area once (Patient taking differently: Apply 1 Application topically daily as needed (For pain).) 30 g 3  . liothyronine (CYTOMEL) 25 MCG tablet Take 25 mcg by mouth daily.    . metoprolol succinate (TOPROL-XL) 100 MG 24 hr tablet Take 1 tablet (100 mg total) by mouth daily. Take with or immediately following a meal. 30  tablet 6  . polyethylene glycol powder (GLYCOLAX/MIRALAX) 17 GM/SCOOP powder Take 17 g by mouth 2 (two) times daily. 255 g 0  . potassium chloride SA (K-DUR,KLOR-CON) 20 MEQ tablet Take 20 mEq by mouth 2 (two) times daily.    [name] PROLENSA 0.07 % SOLN Place 1 drop into the right eye 4 (four) times daily.    [name] Propylene Glycol (SYSTANE BALANCE) 0.6 % SOLN Place 2 drops into the right eye in the morning and at bedtime.    . sacubitril-valsartan (ENTRESTO) 97-103 MG Take 1 tablet by mouth 2 (two) times daily. 60 tablet 5  . TRUE METRIX BLOOD GLUCOSE  TEST test strip SMARTSIG:Via Meter    . TRUEplus Lancets 33G MISC      No current facility-administered medications for this visit.    VITAL SIGNS: There were no vitals taken for this visit. There were no vitals filed for this visit.  Estimated body mass index is 23.5 kg/m as calculated from the following:   Height as of 07/26/23: 5\' 2"  (1.575 m).   Weight as of 07/26/23: 128 lb 8 oz (58.3 kg).   PERFORMANCE STATUS (ECOG) : 1 - Symptomatic but completely ambulatory   Physical Exam General: NAD Cardiovascular: regular rate and rhythm Pulmonary: clear ant fields Abdomen: soft, nontender, + bowel sounds,  kin: no rashes Neurological: A/O x 4,   IMPRESSION:   Anxiety: Ms.Loughridge continues to experience anxiety related to going out in public. She is afraid of coming into contact with strong odors, especially Purell hand sanitizer - that might trigger more seizures. States coming to the cancer center is like coming into a "chemical land mind" which she contributes to feeling poorly.   Emotional supports provided and encouragement to continue utilizing the Valium prior to appointments to help decrease anxiety.     2. Goals of Care  06/27/23- I created space and opportunity for Ms. Borman to express her thoughts and feelings. She is emotional expressing the importance of her quality of life over quantity. Speaks to her cancer being metastatic and awareness that current treatment is keeping "things" stable with some shrinkage while also realistically understanding that it is not curative. Shares that if she chose not to continue with treatment she would focus on doing things that she need to with hopes of not feeling so poorly. Expresses by the time she feels a little better it is time for treatment again and start the cycle over again. Emotional support provided.   We discussed making informed decisions while also focusing on what is most important for her and her wishes. She is agreeable in  plans to allow some time to think about what it is she would want for herself and if she would want to pursue additional treatments in the future. Plans to discuss further with Oncologist at follow-up visit at next visit. States she may be in a different mindset at that time or she may remain persistent on not resuming treatment depending on how she feels over the next 3 weeks.   We discussed her current illness and what it means in the larger context of Her on-going co-morbidities. Natural disease trajectory and expectations were discussed. We discussed the importance of continued conversation with family and their medical providers regarding overall plan of care and treatment options, ensuring decisions are within the context of the patients values and GOCs.  3/12- Ms. Natarajan completed HCPOA paperwork listing her brother Juliene Pina as her primary decision maker in the event she is unable  to speak for herself.  Ms. Hermance remains clear in expressed wishes to continue to treat the the treatable allowing her every opportunity to continue to thrive. Taking life one day at a time. She is encouraged to notify the palliative care office for any symptoms that may arise, such as pain.    PLAN: Monitor use and efficacy of Valium in the management of anxiety. Encourage reporting of any symptoms that may arise. Continue goals of care conversations regarding patient's wishes in the context of her disease process. Palliative will plan to see patient back in 3-4 weeks in collaboration to other oncology appointments.    Patient expressed understanding and was in agreement with this plan. She also understands that She can call the clinic at any time with any questions, concerns, or complaints.   Any controlled substances utilized were prescribed in the context of palliative care. PDMP has been reviewed.    Visit consisted of counseling and education dealing with the complex and emotionally intense issues of  symptom management and palliative care in the setting of serious and potentially life-threatening illness.Greater than 50%  of this time was spent counseling and coordinating care related to the above assessment and plan.  Willette Alma, AGPCNP-BC  Palliative Medicine Team/[facility]  *Please note that this is a verbal dictation therefore any spelling or grammatical errors are due to the "Dragon Medical One" system interpretation.

## 2023-08-07 MED FILL — Fosaprepitant Dimeglumine For IV Infusion 150 MG (Base Eq): INTRAVENOUS | Qty: 5 | Status: AC

## 2023-08-07 MED FILL — Dexamethasone Sodium Phosphate Inj 100 MG/10ML: INTRAMUSCULAR | Qty: 1 | Status: AC

## 2023-08-08 ENCOUNTER — Inpatient Hospital Stay: Payer: Medicare HMO

## 2023-08-08 ENCOUNTER — Inpatient Hospital Stay: Payer: Medicare HMO | Admitting: Nurse Practitioner

## 2023-08-08 ENCOUNTER — Inpatient Hospital Stay: Payer: Medicare HMO | Attending: Hematology and Oncology | Admitting: Hematology and Oncology

## 2023-08-08 VITALS — BP 218/100 | HR 76 | Temp 97.8°F | Resp 18 | Ht 62.0 in | Wt 130.4 lb

## 2023-08-08 DIAGNOSIS — Z170 Estrogen receptor positive status [ER+]: Secondary | ICD-10-CM | POA: Diagnosis not present

## 2023-08-08 DIAGNOSIS — R5383 Other fatigue: Secondary | ICD-10-CM | POA: Insufficient documentation

## 2023-08-08 DIAGNOSIS — C50412 Malignant neoplasm of upper-outer quadrant of left female breast: Secondary | ICD-10-CM | POA: Diagnosis not present

## 2023-08-08 DIAGNOSIS — C787 Secondary malignant neoplasm of liver and intrahepatic bile duct: Secondary | ICD-10-CM | POA: Insufficient documentation

## 2023-08-08 DIAGNOSIS — Z7901 Long term (current) use of anticoagulants: Secondary | ICD-10-CM | POA: Diagnosis not present

## 2023-08-08 LAB — CMP (CANCER CENTER ONLY)
ALT: 22 U/L (ref 0–44)
AST: 32 U/L (ref 15–41)
Albumin: 3.9 g/dL (ref 3.5–5.0)
Alkaline Phosphatase: 119 U/L (ref 38–126)
Anion gap: 8 (ref 5–15)
BUN: 15 mg/dL (ref 8–23)
CO2: 25 mmol/L (ref 22–32)
Calcium: 9.1 mg/dL (ref 8.9–10.3)
Chloride: 106 mmol/L (ref 98–111)
Creatinine: 0.92 mg/dL (ref 0.44–1.00)
GFR, Estimated: >60 mL/min (ref >60)
Glucose, Bld: 213 mg/dL — ABNORMAL HIGH (ref 70–99)
Potassium: 3.7 mmol/L (ref 3.5–5.1)
Sodium: 139 mmol/L (ref 135–145)
Total Bilirubin: 0.5 mg/dL (ref 0.3–1.2)
Total Protein: 6.8 g/dL (ref 6.5–8.1)

## 2023-08-08 LAB — CBC WITH DIFFERENTIAL (CANCER CENTER ONLY)
Abs Immature Granulocytes: 0.02 10*3/uL (ref 0.00–0.07)
Basophils Absolute: 0 10*3/uL (ref 0.0–0.1)
Basophils Relative: 1 %
Eosinophils Absolute: 0.1 10*3/uL (ref 0.0–0.5)
Eosinophils Relative: 1 %
HCT: 37.3 % (ref 36.0–46.0)
Hemoglobin: 12.1 g/dL (ref 12.0–15.0)
Immature Granulocytes: 0 %
Lymphocytes Relative: 26 %
Lymphs Abs: 1.2 10*3/uL (ref 0.7–4.0)
MCH: 29.2 pg (ref 26.0–34.0)
MCHC: 32.4 g/dL (ref 30.0–36.0)
MCV: 89.9 fL (ref 80.0–100.0)
Monocytes Absolute: 0.7 10*3/uL (ref 0.1–1.0)
Monocytes Relative: 15 %
Neutro Abs: 2.7 10*3/uL (ref 1.7–7.7)
Neutrophils Relative %: 57 %
Platelet Count: 224 10*3/uL (ref 150–400)
RBC: 4.15 MIL/uL (ref 3.87–5.11)
RDW: 16 % — ABNORMAL HIGH (ref 11.5–15.5)
WBC Count: 4.6 10*3/uL (ref 4.0–10.5)
nRBC: 0 % (ref 0.0–0.2)

## 2023-08-08 LAB — MAGNESIUM: Magnesium: 1.9 mg/dL (ref 1.7–2.4)

## 2023-08-08 LAB — PHOSPHORUS: Phosphorus: 3.1 mg/dL (ref 2.5–4.6)

## 2023-08-08 MED ORDER — PREDNISONE 50 MG PO TABS: ORAL_TABLET | ORAL | 0 refills | Status: DC

## 2023-08-08 MED ORDER — APIXABAN 5 MG PO TABS: 5.0000 mg | ORAL_TABLET | Freq: Two times a day (BID) | ORAL | 0 refills | Status: DC

## 2023-08-08 NOTE — Progress Notes (Signed)
Patient Care Team: Dortha Kern, MD as PCP - General (Family Medicine) Chilton Si, MD as PCP - Cardiology (Cardiology) Regan Lemming, MD as PCP - Electrophysiology (Cardiology) Dorothy Puffer, MD as Consulting Physician (Radiation Oncology) Serena Croissant, MD as Consulting Physician (Hematology and Oncology) Harriette Bouillon, MD as Consulting Physician (General Surgery) Windell Norfolk, MD as Consulting Physician (Neurology)  DIAGNOSIS:  Encounter Diagnosis  Name Primary?   Malignant neoplasm of upper-outer quadrant of left breast in female, estrogen receptor positive (HCC) Yes    SUMMARY OF ONCOLOGIC HISTORY: Oncology History  Malignant neoplasm of upper-outer quadrant of left breast in female, estrogen receptor positive (HCC)  03/22/2020 Initial Diagnosis   Diffuse left breast swelling. skin thickening and two areas of irregular hypoechogenicity in the 2-3 o'clock region, 4.9cm, and at least 4 abnormal lymph nodes  with cortical thickening. 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    10/28/2020 - 12/17/2020 Radiation Therapy   The patient initially received a dose of 50.4 Gy in 28 fractions to the left breast and supraclavicular region using whole-breast tangent fields. This was delivered using a 3-D conformal technique. The pt received a boost delivering an additional 10 Gy in 5 fractions using a electron boost with electrons. The total dose was 60.4 Gy.   11/2020 - 11/2027  Anti-estrogen oral therapy   Anastrozole   01/30/2022 Miscellaneous   Guardant360:ERBB2 (exon 20 insertion): Benefit from Enhertu, MSI high not detected   11/20/2022 Cancer Staging   Staging form: Breast, AJCC 8th Edition - Pathologic: Stage IV (pM1) - Signed by Loa Socks, NP on 11/20/2022   11/20/2022 -  Chemotherapy   Patient is on Treatment Plan : BREAST METASTATIC Sacituzumab govitecan-hziy Drinda Butts) D1,8 q21d       CHIEF COMPLIANT: Follow-up to discuss her treatment plan  Discussed the use of AI scribe software for clinical note transcription with the patient, who gave verbal consent to proceed.  History of Present Illness   The patient, with a history of metastatic breast cancer, has decided to discontinue treatment. She made this decision after a discussion with her nurse in August and a week of reflection. She turned 82 in July and stated, "I'm not trying to live forever." She has completed 12 cycles of Trodelvy, which she tolerated well.  The patient has been experiencing fatigue, which she describes as "chronic fatigue." She notes that it takes about two weeks after treatment for her to begin to feel like doing something. Her appetite varies from not too good to good, but she ensures she eats every day. She has lost a few pounds, with her weight fluctuating between 130 and 135 pounds. She notes that she is not retaining as much fluid as she used to.  The patient also has a blood clot, which she reports is improving. She has been on Eliquis for almost three months for this. She has a history of allergies, including to contrast dye, and takes Allegra daily for this.  ALLERGIES:  is allergic to alcohol, contrast media [iodinated contrast media], iodine, atorvastatin, dust mite extract, latex, lisinopril, molds & smuts, and wound dressing adhesive.  MEDICATIONS:  Current Outpatient Medications  Medication Sig Dispense Refill   alendronate (FOSAMAX) 70 MG  tablet Take 1 tablet (70 mg total) by mouth once a week. Take with a full glass of water on an empty stomach. 12 tablet 3   amiodarone (PACERONE) 400 MG tablet Take 1 tablet (400 mg total) by mouth daily. 90 tablet 3   bisacodyl (DULCOLAX) 5 MG EC tablet Take 5 mg by mouth daily as needed for moderate constipation.     furosemide (LASIX) 20 MG tablet Take 20 mg by mouth daily.     levETIRAcetam (KEPPRA) 500 MG tablet Take 1 tablet (500 mg total) by mouth 3 (three) times daily. 270 tablet 3   lidocaine-prilocaine (EMLA) cream Apply to affected area once (Patient taking differently: Apply 1 Application topically daily as needed (For pain).) 30 g 3   liothyronine (CYTOMEL) 25 MCG tablet Take 25 mcg by mouth daily.     metoprolol succinate (TOPROL-XL) 100 MG 24 hr tablet Take 1 tablet (100 mg total) by mouth daily. Take with or immediately following a meal. 30 tablet 6   potassium chloride SA (K-DUR,KLOR-CON) 20 MEQ tablet Take 20 mEq by mouth 2 (two) times daily.     PROLENSA 0.07 % SOLN Place 1 drop into the right eye 4 (four) times daily.     Propylene Glycol (SYSTANE BALANCE) 0.6 % SOLN Place 2 drops into the right eye in the morning and at bedtime.     sacubitril-valsartan (ENTRESTO) 97-103 MG Take 1 tablet by mouth 2 (two) times daily. 60 tablet 5   TRUE METRIX BLOOD GLUCOSE TEST test strip SMARTSIG:Via Meter     TRUEplus Lancets 33G MISC      apixaban (ELIQUIS) 5 MG TABS tablet Take 1 tablet (5 mg total) by mouth 2 (two) times daily. For 3 months 180 tablet 0   predniSONE (DELTASONE) 50 MG tablet Take 1 tablet by mouth 12 hours before scan, 1 tab 7 hours before and 1 hour before scan 3 tablet 0   No current facility-administered medications for this visit.    PHYSICAL EXAMINATION: ECOG PERFORMANCE STATUS: 1 - Symptomatic but completely ambulatory  Vitals:   08/08/23 0907 08/08/23 0909  BP: (!) 213/99 (!) 218/100  Pulse: 76   Resp: 18   Temp: 97.8 F (36.6 C)   SpO2: 100%    Filed  Weights   08/08/23 0907  Weight: 130 lb 6.4 oz (59.1 kg)      LABORATORY DATA:  I have reviewed the data as listed    Latest Ref Rng & Units 08/08/2023    8:44 AM 06/27/2023   10:35 AM 06/13/2023    8:35 AM  CMP  Glucose 70 - 99 mg/dL 409  811  914   BUN 8 - 23 mg/dL 15  10  16    Creatinine 0.44 - 1.00 mg/dL 7.82  9.56  2.13   Sodium 135 - 145 mmol/L 139  139  140   Potassium 3.5 - 5.1 mmol/L 3.7  3.2  3.4   Chloride 98 - 111 mmol/L 106  105  106   CO2 22 - 32 mmol/L 25  26  27    Calcium 8.9 - 10.3 mg/dL 9.1  8.3  8.8   Total Protein 6.5 - 8.1 g/dL 6.8  6.2  6.4   Total Bilirubin  0.3 - 1.2 mg/dL 0.5  0.6  0.4   Alkaline Phos 38 - 126 U/L 119  91  67   AST 15 - 41 U/L 32  21  10   ALT 0 - 44 U/L 22  53  9     Lab Results  Component Value Date   WBC 4.6 08/08/2023   HGB 12.1 08/08/2023   HCT 37.3 08/08/2023   MCV 89.9 08/08/2023   PLT 224 08/08/2023   NEUTROABS 2.7 08/08/2023    ASSESSMENT & PLAN:  Malignant neoplasm of upper-outer quadrant of left breast in female, estrogen receptor positive (HCC) 03/22/2020:diffuse left breast swelling. skin thickening and two areas of irregular hypoechogenicity in the 2-3 o'clock region, 4.9cm, and at least 4 abnormal lymph nodes  with cortical thickening. 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  Neoadjuvant chemotherapy with CMF 2.  09/14/2020:Left mastectomy ([name]): invasive and in situ ductal carcinoma, grade 2, 15cm, clear margins, with 14/14 lymph nodes positive for metastatic carcinoma with extracapsular extension.  ER 30%, PR 10%, HER-2 negative, Ki-67 15% 3.  Adjuvant radiation 10/28/2020-12/17/2020 4.  Followed by adjuvant antiestrogen therapy with anastrozole started 01/11/2021 5.  12/07/2021: Liver metastases: Biopsy consistent with  metastatic breast cancer ER 0%, PR 0%, HER2 1+ 6.  Capecitabine 1000 mg p.o. twice daily 2 weeks on 1 week off started 01/11/2022-10/31/2022.  Progression ----------------------------------------------------------------------------------------------------------------------- Current treatment: Trodelvy started 11/20/2022, completed 11 cycles discontinued treatment by patient preference.  Toxicities: Fatigue is moderate in nature possibly due to lack of interest rather than side effect of treatment Fogginess in the head:  Constipation   Purel induced seizure-like activity: Patient says that he is allergic to purell   Hospitalization 03/23/2023-03/25/2023: Breakthrough seizures, AICD firing and abdominal pain treated with amiodarone, CT abdomen pelvis: Liver metastases 1.1 cm (previously 3.2 cm) portacaval lymph node stable  Treatment plan: CT CAP in 2 weeks Follow-up after that to discuss results. No further chemotherapy planned by patient preference. DVT left arm: On Eliquis.  I renewed her prescription today.  Plan to treat her for 6 months.  This was started in June 2024.      Orders Placed This Encounter  Procedures   CT CHEST ABDOMEN PELVIS W CONTRAST    Standing Status:   Future    Standing Expiration Date:   08/07/2024    Order Specific Question:   If indicated for the ordered procedure, I authorize the administration of contrast media per Radiology protocol    Answer:   Yes    Order Specific Question:   Does the patient have a contrast media/X-ray dye allergy?    Answer:   No    Order Specific Question:   Preferred imaging location?    Answer:   [facility]    Order Specific Question:   Release to patient    Answer:   Immediate    Order Specific Question:   If indicated for the ordered procedure, I authorize the administration of oral contrast media per Radiology protocol    Answer:   Yes   The patient has a good understanding of the overall plan. she agrees with it. she  will call with any problems that may develop before the next visit here. Total time spent: 30 mins including face to face time and time  spent for planning, charting and co-ordination of care   Tamsen Meek, MD 08/08/23

## 2023-08-08 NOTE — Assessment & Plan Note (Addendum)
03/22/2020:diffuse left breast swelling. skin thickening and two areas of irregular hypoechogenicity in the 2-3 o'clock region, 4.9cm, and at least 4 abnormal lymph nodes  with cortical thickening. 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  Neoadjuvant chemotherapy with CMF 2.  09/14/2020:Left mastectomy ([name]): invasive and in situ ductal carcinoma, grade 2, 15cm, clear margins, with 14/14 lymph nodes positive for metastatic carcinoma with extracapsular extension.  ER 30%, PR 10%, HER-2 negative, Ki-67 15% 3.  Adjuvant radiation 10/28/2020-12/17/2020 4.  Followed by adjuvant antiestrogen therapy with anastrozole started 01/11/2021 5.  12/07/2021: Liver metastases: Biopsy consistent with metastatic breast cancer ER 0%, PR 0%, HER2 1+ 6.  Capecitabine 1000 mg p.o. twice daily 2 weeks on 1 week off started 01/11/2022-10/31/2022.  Progression ----------------------------------------------------------------------------------------------------------------------- Current treatment: Trodelvy started 11/20/2022, completed 11 cycles discontinued treatment by patient preference.  Toxicities: Fatigue is moderate in nature possibly due to lack of interest rather than side effect of treatment Fogginess in the head:  Constipation   Purel induced seizure-like activity: Patient says that he is allergic to purell   Hospitalization 03/23/2023-03/25/2023: Breakthrough seizures, AICD firing and abdominal pain treated with amiodarone, CT abdomen pelvis: Liver metastases 1.1 cm (previously 3.2 cm) portacaval lymph node stable  Treatment plan: CT CAP in 2 weeks Follow-up after that to discuss results. No further chemotherapy planned by patient preference. DVT left arm: On Eliquis.  I renewed her prescription today.  Plan to treat her  for 6 months.  This was started in June 2024.

## 2023-08-09 ENCOUNTER — Ambulatory Visit (INDEPENDENT_AMBULATORY_CARE_PROVIDER_SITE_OTHER): Payer: Medicare HMO

## 2023-08-09 DIAGNOSIS — I5022 Chronic systolic (congestive) heart failure: Secondary | ICD-10-CM

## 2023-08-14 ENCOUNTER — Other Ambulatory Visit: Payer: Medicare HMO

## 2023-08-14 ENCOUNTER — Ambulatory Visit: Payer: Medicare HMO

## 2023-08-15 LAB — CUP PACEART REMOTE DEVICE CHECK
Battery Remaining Longevity: 138 mo
Battery Voltage: 3.03 V
Brady Statistic RV Percent Paced: 0.05 %
Date Time Interrogation Session: 20241001193536
HighPow Impedance: 50 Ohm
Lead Channel Impedance Value: 266 Ohm
Lead Channel Impedance Value: 399 Ohm
Lead Channel Pacing Threshold Amplitude: 1.375 V
Lead Channel Pacing Threshold Pulse Width: 0.4 ms
Lead Channel Sensing Intrinsic Amplitude: 24.9 mV
Lead Channel Setting Pacing Amplitude: 2 V
Lead Channel Setting Pacing Pulse Width: 0.4 ms
Lead Channel Setting Sensing Sensitivity: 0.3 mV
Zone Setting Status: 755011

## 2023-08-16 ENCOUNTER — Other Ambulatory Visit: Payer: Self-pay | Admitting: *Deleted

## 2023-08-16 NOTE — Progress Notes (Signed)
Received call from [facility] with Radiology that Pt needs 13 hr prep for 08/22/23 CT CAP.   Called Pt and discussed 13 hours prep. Pt is to take 1 tab prednisone at 1:00 AM, second tab at 7:00 AM, and final tab along with 50mg  benadryl at 1:00 PM. Pt asked for rx to be sent to [facility] on [address]. Rx sent, Pt verbalized understanding.

## 2023-08-21 ENCOUNTER — Ambulatory Visit (HOSPITAL_BASED_OUTPATIENT_CLINIC_OR_DEPARTMENT_OTHER): Payer: Medicare HMO | Admitting: Family

## 2023-08-21 ENCOUNTER — Encounter (HOSPITAL_BASED_OUTPATIENT_CLINIC_OR_DEPARTMENT_OTHER): Payer: Self-pay | Admitting: Family

## 2023-08-21 VITALS — BP 144/86 | HR 64 | Ht 62.0 in | Wt 125.1 lb

## 2023-08-21 DIAGNOSIS — I5022 Chronic systolic (congestive) heart failure: Secondary | ICD-10-CM

## 2023-08-21 DIAGNOSIS — I25118 Atherosclerotic heart disease of native coronary artery with other forms of angina pectoris: Secondary | ICD-10-CM

## 2023-08-21 DIAGNOSIS — Z95810 Presence of automatic (implantable) cardiac defibrillator: Secondary | ICD-10-CM

## 2023-08-21 DIAGNOSIS — E785 Hyperlipidemia, unspecified: Secondary | ICD-10-CM | POA: Diagnosis not present

## 2023-08-21 DIAGNOSIS — Z79899 Other long term (current) drug therapy: Secondary | ICD-10-CM

## 2023-08-21 DIAGNOSIS — I472 Ventricular tachycardia, unspecified: Secondary | ICD-10-CM

## 2023-08-21 NOTE — Patient Instructions (Signed)
Medication Instructions:  Continue your current medications.   *If you need a refill on your cardiac medications before your next appointment, please call your pharmacy*   Follow-Up: At [facility], you and your health needs are our priority.  As part of our continuing mission to provide you with exceptional heart care, we have created designated Provider Care Teams.  These Care Teams include your primary Cardiologist (physician) and Advanced Practice Providers (APPs -  Physician Assistants and Nurse Practitioners) who all work together to provide you with the care you need, when you need it.  We recommend signing up for the patient portal called "MyChart".  Sign up information is provided on this After Visit Summary.  MyChart is used to connect with patients for Virtual Visits (Telemedicine).  Patients are able to view lab/test results, encounter notes, upcoming appointments, etc.  Non-urgent messages can be sent to your provider as well.   To learn more about what you can do with MyChart, go to [email or web address].    Your next appointment:   2-3 month(s)  Provider:   Chilton Si, MD or Gillian Shields, NP    Other Instructions  We are considering adding a medication called Spironolactone. This will help to lower your blood pressure and also to strengthen your heart muscle. If you wouls like to add it before your next office visit, simply let us know. You would be able to stop your potassium supplement and start the Spironolactone as it helps to keep your potassium level high.    Spironolactone Tablets What is this medication? SPIRONOLACTONE (speer on oh LAK tone) treats high blood pressure and heart failure. It may also be used to reduce swelling related to heart, kidney, or liver disease. It helps your kidneys remove more fluid and salt from your blood through the urine without losing too much potassium. It belongs to a group of medications called diuretics. This  medicine may be used for other purposes; ask your health care provider or pharmacist if you have questions. COMMON BRAND NAME(S): Aldactone What should I tell my care team before I take this medication? They need to know if you have any of these conditions: Addison's disease or low adrenal gland function High blood level of potassium Kidney disease Liver disease An unusual or allergic reaction to spironolactone, other medications, foods, dyes, or preservatives Pregnant or trying to get pregnant Breast-feeding How should I use this medication? Take this medication by mouth. Take it as directed on the prescription label at the same time every day. You can take it with or without food. You should always take it the same way. Keep taking it unless your care team tells you to stop. Talk to your care team about the use of this medication in children. Special care may be needed. Overdosage: If you think you have taken too much of this medicine contact a poison control center or emergency room at once. NOTE: This medicine is only for you. Do not share this medicine with others. What if I miss a dose? If you miss a dose, take it as soon as you can. If it is almost time for your next dose, take only that dose. Do not take double or extra doses. What may interact with this medication? Do not take this medication with any of the following: Cidofovir Eplerenone Tranylcypromine This medication may also interact with the following: Aspirin Certain medications for blood pressure or heart disease, such as benazepril, lisinopril, losartan, valsartan Certain medications that prevent or  treat blood clots, such as heparin and enoxaparin Cholestyramine Cyclosporine Digoxin Lithium Medications that relax muscles for surgery NSAIDs, medications for pain and inflammation, such as ibuprofen or naproxen Other diuretics Potassium salts or supplements Steroid medications, such as prednisone or  cortisone Trimethoprim This list may not describe all possible interactions. Give your health care provider a list of all the medicines, herbs, non-prescription drugs, or dietary supplements you use. Also tell them if you smoke, drink alcohol, or use illegal drugs. Some items may interact with your medicine. What should I watch for while using this medication? Visit your care team for regular checks on your progress. Check your blood pressure as directed. Know what your blood pressure should be and when to contact your care team. Do not treat yourself for coughs, colds, or pain while you are using this medication without asking your care team for advice. Some medications may increase your blood pressure. Check with your care team if you have severe diarrhea, nausea, and vomiting, or if you sweat a lot. The loss of too much body fluid may make it dangerous for you to take this medication. You may need to be on a special diet while you are taking this medication. Ask your care team. Also, find out how many glasses of fluids you need to drink each day. This medication may affect your coordination, reaction time, or judgment. Do not drive or operate machinery until you know how this medication affects you. Sit up or stand slowly to reduce the risk of dizzy or fainting spells. Drinking alcohol with this medication can increase the risk of these side effects. Avoid salt substitutes unless you are told otherwise by your care team. What side effects may I notice from receiving this medication? Side effects that you should report to your care team as soon as possible: Allergic reactions--skin rash, itching, hives, swelling of the face, lips, tongue, or throat Dehydration--increased thirst, dry mouth, feeling faint or lightheaded, headache, dark yellow or brown urine High potassium level--muscle weakness, fast or irregular heartbeat Kidney injury--decrease in the amount of urine, swelling of the ankles, hands,  or feet Low blood pressure--dizziness, feeling faint or lightheaded, blurry vision Low sodium level--muscle weakness, fatigue, dizziness, headache, confusion Side effects that usually do not require medical attention (report to your care team if they continue or are bothersome): Breast pain or tenderness Changes in sex drive or performance Dizziness Headache Irregular menstrual cycles or spotting Unexpected breast tissue growth This list may not describe all possible side effects. Call your doctor for medical advice about side effects. You may report side effects to FDA at 1-800-FDA-1088. Where should I keep my medication? Keep out of the reach of children and pets. Store below 25 degrees C (77 degrees F). Get rid of any unused medication after the expiration date. To get rid of medications that are no longer needed or have expired: Take the medication to a medication take-back program. Check with your pharmacy or law enforcement to find a location. If you cannot return the medication, check the label or package insert to see if the medication should be thrown out in the garbage or flushed down the toilet. If you are not sure, ask your care team. If it is safe to put into the trash, take the medication out of the container. Mix the medication with cat litter, dirt, coffee grounds, or other unwanted substance. Seal the mixture in a bag or container. Put it in the trash. NOTE: This sheet is a summary.  It may not cover all possible information. If you have questions about this medicine, talk to your doctor, pharmacist, or health care provider.  2024 Elsevier/Gold Standard (2022-05-18 00:00:00)

## 2023-08-21 NOTE — Progress Notes (Unsigned)
Cardiology Office Note:  .   Date:  08/21/2023  ID:  Colleen Lowery, DOB 08-28-1941, MRN [id number] PCP: Dortha Kern, MD  [facility] Providers Cardiologist:  Chilton Si, MD Electrophysiologist:  Will Jorja Loa, MD { Click to update primary MD,subspecialty MD or APP then REFRESH:1}   History of Present Illness: .   Colleen Lowery is a 82 y.o. 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 She was started on Amiodarone.  Seen 04/12/2023 at which time losartan was transition to [facility].  Later increased to 97-103 mg twice daily dosing.  She saw Dr. Elberta Fortis 04/25/2023 with LUE swelling with imaging revealing DVT and Eliquis initiated.   Presents today for follow up independently.  Has CT upcoming with her oncology team and has elected to discontinue further chemotherapy.  Eats heart healthy diet frequently vegetarian meals or only ground [place]. Continued intermittent phlegm for which she takes OTC 'taurine' to help with this. Notes uses many supplements. Reports good and bad days. Does note sometimes on her good days she overdoes it and then feels a bit tired. Breathing has been overall well. Blood pressures at home have been in the 140s. Does note often her blood pressure at the doctor's office is very high. She  notes her blood pressure is responsive to smells such as Purell which she attributes to higher in office pressures. No edema, orthopnea, PND.  ROS: Please see the history of present illness.    All other systems reviewed and are negative.   Studies Reviewed: .        *** Risk Assessment/Calculations:     The patient's 1st BP is elevated (>139/89)*** Repeat BP and {Click to enter a 2nd BP Refresh Note  :1}       Physical Exam:   VS:  BP (!) 144/86   Pulse 64   Ht 5\' 2"  (1.575 m)   Wt 125 lb 1.6 oz (56.7 kg)   SpO2  99%   BMI 22.88 kg/m    Wt Readings from Last 3 Encounters:  08/21/23 125 lb 1.6 oz (56.7 kg)  08/08/23 130 lb 6.4 oz (59.1 kg)  07/26/23 128 lb 8 oz (58.3 kg)    GEN: Well nourished, thin, well developed in no acute distress NECK: No JVD; No carotid bruits CARDIAC: RRR, no murmurs, rubs, gallops RESPIRATORY:  Clear to auscultation without rales, wheezing or rhonchi  ABDOMEN: Soft, non-tender, non-distended EXTREMITIES:  No edema; No deformity   ASSESSMENT AND PLAN: .    HFrEF -   HTN - VT / Amiodarone therapy - Previously on Sotalol but had breakthrough NSVT per prior records. Will complete 14 days of Amiodarone 400mg  BID then reduce to 400mg  QD. Consider thyroid panel, liver panel at follow up.    CAD / HLD, LDL goal < 70 - Stable with no anginal symptoms. No indication for ischemic evaluation.  GDMT Toprol. Prior statin intolerance with myalgia.    Medtronic single chamber ICD - Upcoming visit to establish with Dr. Elberta Fortis. No recurrent ICD firing since recent admission.    Metastatic breast cancer - Undergoing chemotherapy.    HFrEF - 2017 LVEF 40-45%. 03/25/23 LVEF 35-40%, LV global hypokinesis, ildly elevated PASP, LA severely dilated, mild MR. Euvolemic and well compensated on exam. GDMT Lasix 20mg  QD. Will stop Losartan and start  Entresto 49-51mg  BID. Plan for CMP at follow up 04/26/23. Consider further titration of Entresto vs addition of Spironolactone at follow up.    Hypothyroidism - predates amiodarone. Continue to follow with PCP.    HTN - Elevated in clinic 180/96. She reports white coat hypertension with home BP 140/70. Discussed to monitor BP at home at least 2 hours after medications and sitting for 5-10 minutes. Stop Losartan, start Entresto as above.           Dispo: follow up in 2-3 months  Signed, Alver Sorrow, NP

## 2023-08-22 ENCOUNTER — Ambulatory Visit (HOSPITAL_COMMUNITY)
Admission: RE | Admit: 2023-08-22 | Discharge: 2023-08-22 | Disposition: A | Discharge status: Home / Self Care | Payer: Medicare HMO | Source: Ambulatory Visit | Attending: Hematology and Oncology | Admitting: Hematology and Oncology

## 2023-08-22 DIAGNOSIS — C50412 Malignant neoplasm of upper-outer quadrant of left female breast: Secondary | ICD-10-CM | POA: Diagnosis present

## 2023-08-22 DIAGNOSIS — Z170 Estrogen receptor positive status [ER+]: Secondary | ICD-10-CM | POA: Diagnosis present

## 2023-08-22 MED ORDER — HEPARIN SOD (PORK) LOCK FLUSH 100 UNIT/ML IV SOLN
500.0000 [IU] | Freq: Once | INTRAVENOUS | Status: AC
  Administered 2023-08-22: 500 [IU] via INTRAVENOUS

## 2023-08-22 MED ORDER — IOHEXOL 300 MG/ML  SOLN
100.0000 mL | Freq: Once | INTRAMUSCULAR | Status: AC | PRN
  Administered 2023-08-22: 100 mL via INTRAVENOUS

## 2023-08-22 MED ORDER — SODIUM CHLORIDE (PF) 0.9 % IJ SOLN
INTRAMUSCULAR | Status: AC
  Filled 2023-08-22: qty 50

## 2023-08-22 NOTE — Progress Notes (Signed)
Remote ICD transmission.   

## 2023-08-23 ENCOUNTER — Other Ambulatory Visit: Payer: Self-pay | Admitting: Family Medicine

## 2023-08-23 DIAGNOSIS — Z1231 Encounter for screening mammogram for malignant neoplasm of breast: Secondary | ICD-10-CM

## 2023-08-24 ENCOUNTER — Telehealth: Payer: Self-pay | Admitting: *Deleted

## 2023-08-24 ENCOUNTER — Ambulatory Visit
Admission: RE | Admit: 2023-08-24 | Discharge: 2023-08-24 | Disposition: A | Discharge status: Home / Self Care | Payer: Medicare HMO | Source: Ambulatory Visit | Attending: Family Medicine | Admitting: Family Medicine

## 2023-08-24 DIAGNOSIS — Z1231 Encounter for screening mammogram for malignant neoplasm of breast: Secondary | ICD-10-CM

## 2023-08-24 NOTE — Telephone Encounter (Signed)
Left message to call back  

## 2023-08-24 NOTE — Telephone Encounter (Signed)
-----   Message from [facility] sent at 08/17/2023  1:49 PM EDT ----- Abnormal device interrogation reviewed.  Lead parameters and battery status stable.  Longer VT episodes.  Increase Toprol-XL to 100 mg twice daily.

## 2023-08-28 ENCOUNTER — Ambulatory Visit: Payer: Medicare HMO

## 2023-08-28 ENCOUNTER — Other Ambulatory Visit: Payer: Medicare HMO

## 2023-08-31 MED ORDER — METOPROLOL SUCCINATE ER 100 MG PO TB24: 100.0000 mg | ORAL_TABLET | Freq: Two times a day (BID) | ORAL | 6 refills | Status: DC

## 2023-08-31 NOTE — Telephone Encounter (Signed)
Pt returns my call and informed of transmission/recommendation. Pt will increase Toprol to BID and let us know if issues occur after increase. Patient verbalized understanding and agreeable to plan.

## 2023-09-04 ENCOUNTER — Ambulatory Visit: Payer: Medicare HMO

## 2023-09-04 ENCOUNTER — Ambulatory Visit: Payer: Medicare HMO | Admitting: Physician Assistant

## 2023-09-04 ENCOUNTER — Other Ambulatory Visit: Payer: Medicare HMO

## 2023-09-18 ENCOUNTER — Inpatient Hospital Stay: Payer: Medicare HMO | Admitting: Hematology and Oncology

## 2023-09-18 ENCOUNTER — Inpatient Hospital Stay: Payer: Medicare HMO | Admitting: Nurse Practitioner

## 2023-09-18 ENCOUNTER — Inpatient Hospital Stay: Payer: Medicare HMO | Attending: Hematology and Oncology

## 2023-09-18 ENCOUNTER — Ambulatory Visit: Payer: Medicare HMO

## 2023-09-18 ENCOUNTER — Other Ambulatory Visit: Payer: Self-pay | Admitting: *Deleted

## 2023-09-18 VITALS — BP 180/92 | HR 55 | Temp 97.8°F | Resp 18 | Ht 62.0 in | Wt 125.0 lb

## 2023-09-18 DIAGNOSIS — Z79811 Long term (current) use of aromatase inhibitors: Secondary | ICD-10-CM | POA: Insufficient documentation

## 2023-09-18 DIAGNOSIS — Z170 Estrogen receptor positive status [ER+]: Secondary | ICD-10-CM

## 2023-09-18 DIAGNOSIS — C787 Secondary malignant neoplasm of liver and intrahepatic bile duct: Secondary | ICD-10-CM | POA: Diagnosis not present

## 2023-09-18 DIAGNOSIS — C50412 Malignant neoplasm of upper-outer quadrant of left female breast: Secondary | ICD-10-CM

## 2023-09-18 DIAGNOSIS — Z95828 Presence of other vascular implants and grafts: Secondary | ICD-10-CM

## 2023-09-18 LAB — CMP (CANCER CENTER ONLY)
ALT: 12 U/L (ref 0–44)
AST: 25 U/L (ref 15–41)
Albumin: 3.9 g/dL (ref 3.5–5.0)
Alkaline Phosphatase: 130 U/L — ABNORMAL HIGH (ref 38–126)
Anion gap: 7 (ref 5–15)
BUN: 16 mg/dL (ref 8–23)
CO2: 26 mmol/L (ref 22–32)
Calcium: 9.3 mg/dL (ref 8.9–10.3)
Chloride: 106 mmol/L (ref 98–111)
Creatinine: 1.03 mg/dL — ABNORMAL HIGH (ref 0.44–1.00)
GFR, Estimated: 55 mL/min — ABNORMAL LOW (ref >60)
Glucose, Bld: 175 mg/dL — ABNORMAL HIGH (ref 70–99)
Potassium: 3.3 mmol/L — ABNORMAL LOW (ref 3.5–5.1)
Sodium: 139 mmol/L (ref 135–145)
Total Bilirubin: 0.7 mg/dL (ref <1.2)
Total Protein: 7 g/dL (ref 6.5–8.1)

## 2023-09-18 LAB — CBC WITH DIFFERENTIAL (CANCER CENTER ONLY)
Abs Immature Granulocytes: 0.02 10*3/uL (ref 0.00–0.07)
Basophils Absolute: 0 10*3/uL (ref 0.0–0.1)
Basophils Relative: 1 %
Eosinophils Absolute: 0.1 10*3/uL (ref 0.0–0.5)
Eosinophils Relative: 1 %
HCT: 38.5 % (ref 36.0–46.0)
Hemoglobin: 12.7 g/dL (ref 12.0–15.0)
Immature Granulocytes: 0 %
Lymphocytes Relative: 27 %
Lymphs Abs: 1.3 10*3/uL (ref 0.7–4.0)
MCH: 28.9 pg (ref 26.0–34.0)
MCHC: 33 g/dL (ref 30.0–36.0)
MCV: 87.7 fL (ref 80.0–100.0)
Monocytes Absolute: 0.7 10*3/uL (ref 0.1–1.0)
Monocytes Relative: 14 %
Neutro Abs: 2.6 10*3/uL (ref 1.7–7.7)
Neutrophils Relative %: 57 %
Platelet Count: 271 10*3/uL (ref 150–400)
RBC: 4.39 MIL/uL (ref 3.87–5.11)
RDW: 16.2 % — ABNORMAL HIGH (ref 11.5–15.5)
WBC Count: 4.6 10*3/uL (ref 4.0–10.5)
nRBC: 0 % (ref 0.0–0.2)

## 2023-09-18 LAB — MAGNESIUM: Magnesium: 1.8 mg/dL (ref 1.7–2.4)

## 2023-09-18 MED ORDER — HEPARIN SOD (PORK) LOCK FLUSH 100 UNIT/ML IV SOLN: 500.0000 [IU] | Freq: Once | INTRAVENOUS | Status: DC

## 2023-09-18 MED ORDER — SODIUM CHLORIDE 0.9% FLUSH: 10.0000 mL | Freq: Once | INTRAVENOUS | Status: DC

## 2023-09-18 NOTE — Progress Notes (Signed)
Per MD request, referral placed and called to [facility].

## 2023-09-18 NOTE — Assessment & Plan Note (Signed)
03/22/2020:diffuse left breast swelling. skin thickening and two areas of irregular hypoechogenicity in the 2-3 o'clock region, 4.9cm, and at least 4 abnormal lymph nodes  with cortical thickening. 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  Neoadjuvant chemotherapy with CMF 2.  09/14/2020:Left mastectomy ([name]): invasive and in situ ductal carcinoma, grade 2, 15cm, clear margins, with 14/14 lymph nodes positive for metastatic carcinoma with extracapsular extension.  ER 30%, PR 10%, HER-2 negative, Ki-67 15% 3.  Adjuvant radiation 10/28/2020-12/17/2020 4.  Followed by adjuvant antiestrogen therapy with anastrozole started 01/11/2021 5.  12/07/2021: Liver metastases: Biopsy consistent with metastatic breast cancer ER 0%, PR 0%, HER2 1+ 6.  Capecitabine 1000 mg p.o. twice daily 2 weeks on 1 week off started 01/11/2022-10/31/2022.  Progression 7. Trodelvy started 11/20/2022, completed 11 cycles discontinued treatment by patient preference. ----------------------------------------------------------------------------------------------------------------------- CT CAP 08/30/2023: Marked progression of hepatic metastases.  (Conglomerate of smaller masses measured 6.3 cm compared to 1.2 cm previously), new liver nodule 2.2 cm, segment 2 mass 4.7 cm  Treatment plan: Based on progression of disease I recommended switching her treatment to Enhertu We discussed the risks and benefits of Enhertu.

## 2023-09-18 NOTE — Progress Notes (Signed)
Patient Care Team: Dortha Kern, MD as PCP - General (Family Medicine) Chilton Si, MD as PCP - Cardiology (Cardiology) Regan Lemming, MD as PCP - Electrophysiology (Cardiology) Dorothy Puffer, MD as Consulting Physician (Radiation Oncology) Serena Croissant, MD as Consulting Physician (Hematology and Oncology) Harriette Bouillon, MD as Consulting Physician (General Surgery) Windell Norfolk, MD as Consulting Physician (Neurology)  DIAGNOSIS:  Encounter Diagnosis  Name Primary?   Malignant neoplasm of upper-outer quadrant of left breast in female, estrogen receptor positive (HCC) Yes    SUMMARY OF ONCOLOGIC HISTORY: Oncology History  Malignant neoplasm of upper-outer quadrant of left breast in female, estrogen receptor positive (HCC)  03/22/2020 Initial Diagnosis   Diffuse left breast swelling. skin thickening and two areas of irregular hypoechogenicity in the 2-3 o'clock region, 4.9cm, and at least 4 abnormal lymph nodes  with cortical thickening. 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, grade 2, HER-2 negative (1+), ER+ 30%, PR+ 10%, Ki67 15%   04/07/2020 Cancer Staging   Staging form: Breast, AJCC 8th Edition - Clinical stage from 04/07/2020: Stage IIIB (cT4b, cN1, cM0, G2, ER+, PR+, HER2-)    04/19/2020 -  Chemotherapy   CMF   09/14/2020 Surgery   Left mastectomy ([name]) ([id number]): invasive and in situ ductal carcinoma, grade 2, 15cm, clear margins, with 14/14 lymph nodes positive for metastatic carcinoma with extracapsular extension.    10/28/2020 - 12/17/2020 Radiation Therapy   The patient initially received a dose of 50.4 Gy in 28 fractions to the left breast and supraclavicular region using whole-breast tangent fields. This was delivered using a 3-D conformal technique. The pt received a boost delivering an additional 10 Gy in 5 fractions using a electron boost with electrons. The total dose was 60.4 Gy.   11/2020 - 11/2027  Anti-estrogen oral therapy   Anastrozole   01/30/2022 Miscellaneous   Guardant360:ERBB2 (exon 20 insertion): Benefit from Enhertu, MSI high not detected   11/20/2022 Cancer Staging   Staging form: Breast, AJCC 8th Edition - Pathologic: Stage IV (pM1) - Signed by Loa Socks, NP on 11/20/2022   11/20/2022 -  Chemotherapy   Patient is on Treatment Plan : BREAST METASTATIC Sacituzumab govitecan-hziy Drinda Butts) D1,8 q21d       CHIEF COMPLIANT: Follow-up to discuss results of scans  HISTORY OF PRESENT ILLNESS:   History of Present Illness   Colleen Lowery, a patient with a known diagnosis of met breast cancer, presents with a significant increase in the size of spots in her liver. She reports that the spots have grown from 1.2 cm to 6.3 cm, with new spots also appearing. She describes feeling pressure in her liver area, which she attributes to the increased size of the spots. She has reviewed her scans and is aware of the severity of her condition.  The patient has a history of struggling with the side effects of treatments due to her body's unique metabolism. She reports significant fatigue, which led her to stop her last treatment. She also mentions having allergies and sensitivity to environmental factors, which have affected her quality of life.  She has a support system in place, including her sister and a close friend, but lives alone due to her allergies. She expresses concern about her ability to manage her home due to her fatigue. She also mentions a history of heart disease in her family, with her mother living to 69 years old.         ALLERGIES:  is  allergic to alcohol, contrast media [iodinated contrast media], iodine, atorvastatin, dust mite extract, latex, lisinopril, molds & smuts, and wound dressing adhesive.  MEDICATIONS:  Current Outpatient Medications  Medication Sig Dispense Refill   alendronate (FOSAMAX) 70 MG tablet Take 1 tablet (70 mg total) by mouth once a  week. Take with a full glass of water on an empty stomach. 12 tablet 3   amiodarone (PACERONE) 400 MG tablet Take 1 tablet (400 mg total) by mouth daily. 90 tablet 3   apixaban (ELIQUIS) 5 MG TABS tablet Take 1 tablet (5 mg total) by mouth 2 (two) times daily. For 3 months 180 tablet 0   bisacodyl (DULCOLAX) 5 MG EC tablet Take 5 mg by mouth daily as needed for moderate constipation.     furosemide (LASIX) 20 MG tablet Take 20 mg by mouth daily.     levETIRAcetam (KEPPRA) 500 MG tablet Take 1 tablet (500 mg total) by mouth 3 (three) times daily. 270 tablet 3   lidocaine-prilocaine (EMLA) cream Apply to affected area once (Patient taking differently: Apply 1 Application topically daily as needed (For pain).) 30 g 3   liothyronine (CYTOMEL) 25 MCG tablet Take 25 mcg by mouth daily.     metoprolol succinate (TOPROL-XL) 100 MG 24 hr tablet Take 1 tablet (100 mg total) by mouth in the morning and at bedtime. Take with or immediately following a meal. 60 tablet 6   potassium chloride SA (K-DUR,KLOR-CON) 20 MEQ tablet Take 20 mEq by mouth 2 (two) times daily.     predniSONE (DELTASONE) 50 MG tablet Take 1 tablet by mouth 12 hours before scan, 1 tab 7 hours before and 1 hour before scan 3 tablet 0   PROLENSA 0.07 % SOLN Place 1 drop into the right eye 4 (four) times daily.     Propylene Glycol (SYSTANE BALANCE) 0.6 % SOLN Place 2 drops into the right eye in the morning and at bedtime.     sacubitril-valsartan (ENTRESTO) 97-103 MG Take 1 tablet by mouth 2 (two) times daily. 60 tablet 5   TRUE METRIX BLOOD GLUCOSE TEST test strip SMARTSIG:Via Meter     TRUEplus Lancets 33G MISC      No current facility-administered medications for this visit.    PHYSICAL EXAMINATION: ECOG PERFORMANCE STATUS: 1 - Symptomatic but completely ambulatory  Vitals:   09/18/23 0910  BP: (!) 180/92  Pulse: (!) 55  Resp: 18  Temp: 97.8 F (36.6 C)  SpO2: 100%   Filed Weights   09/18/23 0910  Weight: 125 lb (56.7 kg)       LABORATORY DATA:  I have reviewed the data as listed    Latest Ref Rng & Units 09/18/2023    8:52 AM 08/08/2023    8:44 AM 06/27/2023   10:35 AM  CMP  Glucose 70 - 99 mg/dL 981  191  478   BUN 8 - 23 mg/dL 16  15  10    Creatinine 0.44 - 1.00 mg/dL 2.95  6.21  3.08   Sodium 135 - 145 mmol/L 139  139  139   Potassium 3.5 - 5.1 mmol/L 3.3  3.7  3.2   Chloride 98 - 111 mmol/L 106  106  105   CO2 22 - 32 mmol/L 26  25  26    Calcium 8.9 - 10.3 mg/dL 9.3  9.1  8.3   Total Protein 6.5 - 8.1 g/dL 7.0  6.8  6.2   Total Bilirubin <1.2 mg/dL 0.7  0.5  0.6  Alkaline Phos 38 - 126 U/L 130  119  91   AST 15 - 41 U/L 25  32  21   ALT 0 - 44 U/L 12  22  53     Lab Results  Component Value Date   WBC 4.6 09/18/2023   HGB 12.7 09/18/2023   HCT 38.5 09/18/2023   MCV 87.7 09/18/2023   PLT 271 09/18/2023   NEUTROABS 2.6 09/18/2023    ASSESSMENT & PLAN:  Malignant neoplasm of upper-outer quadrant of left breast in female, estrogen receptor positive (HCC) 03/22/2020:diffuse left breast swelling. skin thickening and two areas of irregular hypoechogenicity in the 2-3 o'clock region, 4.9cm, and at least 4 abnormal lymph nodes  with cortical thickening. 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  Neoadjuvant chemotherapy with CMF 2.  09/14/2020:Left mastectomy ([name]): invasive and in situ ductal carcinoma, grade 2, 15cm, clear margins, with 14/14 lymph nodes positive for metastatic carcinoma with extracapsular extension.  ER 30%, PR 10%, HER-2 negative, Ki-67 15% 3.  Adjuvant radiation 10/28/2020-12/17/2020 4.  Followed by adjuvant antiestrogen therapy with anastrozole started 01/11/2021 5.  12/07/2021: Liver metastases: Biopsy consistent with metastatic breast cancer ER 0%, PR 0%, HER2 1+ 6.  Capecitabine  1000 mg p.o. twice daily 2 weeks on 1 week off started 01/11/2022-10/31/2022.  Progression 7. Trodelvy started 11/20/2022, completed 11 cycles discontinued treatment by patient preference. ----------------------------------------------------------------------------------------------------------------------- CT CAP 08/30/2023: Marked progression of hepatic metastases.  (Conglomerate of smaller masses measured 6.3 cm compared to 1.2 cm previously), new liver nodule 2.2 cm, segment 2 mass 4.7 cm  Treatment plan: Based on progression of disease I recommended switching her treatment to Enhertu We discussed the risks and benefits of Enhertu. ------------------------------------- Assessment and Plan    Metastatic Cancer with Liver Involvement Significant progression of disease with multiple new liver lesions, the largest measuring 6.3 cm. Patient is HER2 low, which opens up the possibility of a new treatment option. However, patient's history of difficulty tolerating treatments due to unique metabolism must be considered. -Consider treatment with Enhertu (a HER2-targeting drug), which is administered as an IV infusion every three weeks. Side effects include nausea, fatigue, potential heart function issues, and potential blood count issues. Rare side effect of lung inflammation (ILD) also noted. -Plan to connect patient with palliative/hospice team for symptom management and support at home.  General Health Maintenance / Followup Plans -Continue current medications as prescribed. -Plan for palliative/hospice team to visit patient at home to discuss services and support. -Follow-up as needed for symptom management and support.      Will request hospitalist to consult    No orders of the defined types were placed in this encounter.  The patient has a good understanding of the overall plan. she agrees with it. she will call with any problems that may develop before the next visit here. Total time spent:  30 mins including face to face time and time spent for planning, charting and co-ordination of care   Tamsen Meek, MD 09/18/23

## 2023-09-25 ENCOUNTER — Ambulatory Visit: Payer: Medicare HMO

## 2023-09-25 ENCOUNTER — Other Ambulatory Visit: Payer: Medicare HMO

## 2023-10-04 ENCOUNTER — Encounter: Payer: Self-pay | Admitting: Hematology and Oncology

## 2023-10-04 NOTE — Telephone Encounter (Signed)
Telephone call  

## 2023-10-09 ENCOUNTER — Ambulatory Visit: Payer: Medicare HMO | Admitting: Hematology and Oncology

## 2023-10-09 ENCOUNTER — Ambulatory Visit: Payer: Medicare HMO

## 2023-10-09 ENCOUNTER — Other Ambulatory Visit: Payer: Medicare HMO

## 2023-10-16 ENCOUNTER — Ambulatory Visit: Payer: Medicare HMO

## 2023-10-16 ENCOUNTER — Ambulatory Visit: Payer: Medicare HMO | Admitting: Hematology and Oncology

## 2023-10-16 ENCOUNTER — Other Ambulatory Visit: Payer: Medicare HMO

## 2023-11-08 ENCOUNTER — Ambulatory Visit (INDEPENDENT_AMBULATORY_CARE_PROVIDER_SITE_OTHER)

## 2023-11-08 DIAGNOSIS — I472 Ventricular tachycardia, unspecified: Secondary | ICD-10-CM

## 2023-11-08 LAB — CUP PACEART REMOTE DEVICE CHECK
Battery Remaining Longevity: 135 mo
Battery Voltage: 3.03 V
Brady Statistic RV Percent Paced: 0.04 %
Date Time Interrogation Session: 20241226054816
HighPow Impedance: 47 Ohm
Lead Channel Impedance Value: 304 Ohm
Lead Channel Impedance Value: 418 Ohm
Lead Channel Pacing Threshold Amplitude: 1.625 V
Lead Channel Pacing Threshold Pulse Width: 0.4 ms
Lead Channel Sensing Intrinsic Amplitude: 20.4 mV
Lead Channel Setting Pacing Amplitude: 2.5 V
Lead Channel Setting Pacing Pulse Width: 0.4 ms
Lead Channel Setting Sensing Sensitivity: 0.3 mV
Zone Setting Status: 755011

## 2023-11-26 ENCOUNTER — Encounter (HOSPITAL_BASED_OUTPATIENT_CLINIC_OR_DEPARTMENT_OTHER): Payer: Self-pay | Admitting: Family

## 2023-11-26 ENCOUNTER — Other Ambulatory Visit (HOSPITAL_BASED_OUTPATIENT_CLINIC_OR_DEPARTMENT_OTHER): Payer: Self-pay

## 2023-11-26 ENCOUNTER — Ambulatory Visit (HOSPITAL_BASED_OUTPATIENT_CLINIC_OR_DEPARTMENT_OTHER): Admitting: Family

## 2023-11-26 VITALS — BP 170/88 | HR 103 | Ht 62.0 in | Wt 123.8 lb

## 2023-11-26 DIAGNOSIS — I25118 Atherosclerotic heart disease of native coronary artery with other forms of angina pectoris: Secondary | ICD-10-CM | POA: Diagnosis not present

## 2023-11-26 DIAGNOSIS — Z95810 Presence of automatic (implantable) cardiac defibrillator: Secondary | ICD-10-CM

## 2023-11-26 DIAGNOSIS — Z79899 Other long term (current) drug therapy: Secondary | ICD-10-CM

## 2023-11-26 DIAGNOSIS — I5022 Chronic systolic (congestive) heart failure: Secondary | ICD-10-CM | POA: Diagnosis not present

## 2023-11-26 DIAGNOSIS — I472 Ventricular tachycardia, unspecified: Secondary | ICD-10-CM

## 2023-11-26 MED ORDER — SPIRONOLACTONE 25 MG PO TABS
12.5000 mg | ORAL_TABLET | Freq: Every day | ORAL | 0 refills | Status: DC
  Filled 2023-11-26: qty 30, 60d supply, fill #0

## 2023-11-26 NOTE — Patient Instructions (Signed)
 Medication Instructions:  Start Spirolactone 12.5 mg ( Take 1/2 of 25 mg tablet Daily) *If you need a refill on your cardiac medications before your next appointment, please call your pharmacy*   Lab Work: BMEt, TSH . 1-2 Weeks If you have labs (blood work) drawn today and your tests are completely normal, you will receive your results only by: MyChart Message (if you have MyChart) OR A paper copy in the mail If you have any lab test that is abnormal or we need to change your treatment, we will call you to review the results.   Testing/Procedures: No Testing   Follow-Up: At [facility], you and your health needs are our priority.  As part of our continuing mission to provide you with exceptional heart care, we have created designated Provider Care Teams.  These Care Teams include your primary Cardiologist (physician) and Advanced Practice Providers (APPs -  Physician Assistants and Nurse Practitioners) who all work together to provide you with the care you need, when you need it.  We recommend signing up for the patient portal called MyChart.  Sign up information is provided on this After Visit Summary.  MyChart is used to connect with patients for Virtual Visits (Telemedicine).  Patients are able to view lab/test results, encounter notes, upcoming appointments, etc.  Non-urgent messages can be sent to your provider as well.   To learn more about what you can do with MyChart, go to [email or web address].    Your next appointment:   3 month(s)  Provider:   Annabella Scarce, MD  or, Reche Finder, NP

## 2023-11-26 NOTE — Progress Notes (Signed)
 Cardiology Office Note:  .   Date:  11/26/2023  ID:  Colleen Lowery, DOB 07/25/1941, MRN [id number] PCP: Derick Leita POUR, MD  [facility] Providers Cardiologist:  Annabella Scarce, MD Electrophysiologist:  Soyla Gladis Norton, MD    History of Present Illness: .   Colleen Lowery is a 83 y.o. 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   Admitted 5/10-5/12/24 after VT with AICD firing. She was started on Amiodarone .  Seen 04/12/2023 at which time losartan  was transition to Entresto .  Later increased to 97-103 mg twice daily dosing.  She saw Dr. Norton 04/25/2023 with LUE swelling with imaging revealing DVT and Eliquis  initiated.  Last seen 08/21/23 at which time medications were continued and she was given information on Spironolactone  to review. Since that time abnormal device interrogation with increased VT episodes and Toprol  increased to 100mg  BID.    Presents today for follow up independently.  Eats heart healthy diet frequently vegetarian meals or only ground turkey. Continued intermittent phlegm for which she takes OTC 'taurine' to help with this. Notes uses many supplements. No edema, orthopnea, PND. Reports her blood pressure at home goes up and down. Blood pressure ranges 120-150s. Most readings 150s/80s. She checks prior to her medications. Notes she is not moving as often which she attributes to the weather. Reports she cannot weigh at home as she can't read the scale when she is standing on it. Dsicussed purchasing a scale that talks.   ROS: Please see the history of present illness.    All other systems reviewed and are negative.   Studies Reviewed: .        Cardiac Studies & Procedures   CARDIAC CATHETERIZATION  CARDIAC CATHETERIZATION 02/10/2016  Narrative Images from the original result were not included. 1. Severe single vessel CAD: Mid RCA to Dist RCA lesion, 100%  stenosed. 2. There is moderate left ventricular systolic dysfunction. Basal to mid inferior hypokinesis/akinesis 3. 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 4. Large PCWP V wave suggestive of potential ischemic MR 5. Severe systemic hypertension   The patient has ischemic cardiomyopathy with an occluded RCA and very fine collaterals to the distal RCA branch vessels. I suspect that this is a subacute occlusion dating back to her initial onset discomfort a week ago. The occlusion appears to be consolidated and likely difficult to open at this time. Could consider CTO intervention in the future.  Patient has severe systemic hypertension with elevated LVEDP and wedge pressure needs to be treated. She likely also needs additional diuresis.  Plan:  Return to nursing unit for continued medical management.  I have increased her Lasix  to 40 mg twice a day by mouth  Will defer to primary team and electrophysiology catheter inside the benefit for ICD.    ANNER ALM ORN, M.D., M.S. Interventional Cardiologist  Pager # [phone] Phone # [phone] [address] [place], [place] [address]  Findings Coronary Findings Diagnostic  Dominance: Right  Left Main . Vessel is large. Vessel is angiographically normal.  Left Anterior Descending . Vessel is large. Discrete located at the major branch.  First Diagonal Branch The vessel is moderate in size.  Lateral First Diagonal Branch The vessel is small in size.  First Septal Branch The vessel is small in size.  Second Septal Branch The vessel is small in size.  Left Circumflex . Vessel is large. Vessel is angiographically normal. The vessel is tortuous.  Lateral First Obtuse Marginal Branch The vessel is small in size. Provides very scant collaterals to the RPL system. Very small spiderweb- like vessels  Right Coronary Artery . Vessel is large. The  lesion is type C Diffuse with heavy thrombus chronic total occlusion located at the bend.  Acute Marginal Branch The vessel is moderate in size.  Right Posterior Descending Artery The vessel is small in size. Spider web-like vessel. Collaterals RPDA filled by collaterals from Dist LAD.  Right Posterior Atrioventricular Artery The vessel is small in size.  First Right Posterolateral Branch Collaterals 1st RPL filled by collaterals from Lat 1st Mrg.  Intervention  No interventions have been documented.    ECHOCARDIOGRAM  ECHOCARDIOGRAM COMPLETE 03/25/2023  Narrative ECHOCARDIOGRAM REPORT    Patient Name:   Colleen Lowery Date of Exam: 03/25/2023 Medical Rec #:  [id number]    Height:       62.0 in Accession #:    [id number]   Weight:       139.6 lb Date of Birth:  January 17, 1941     BSA:          1.641 m Patient Age:    81 years     BP:           147/74 mmHg Patient Gender: F            HR:           63 bpm. Exam Location:  Inpatient  Procedure: 2D Echo, Cardiac Doppler and Color Doppler  Indications:    CHF  History:        Patient has prior history of Echocardiogram examinations, most recent 02/08/2016. CHF, CAD, Defibrillator, PAD, Arrythmias:Vtach; Risk Factors:Hypertension. Breast Cancer s/p mastectomy.  Sonographer:    Eleanor Lincoln Referring Phys: [id number] TIFFANY [place]   Sonographer Comments: Image acquisition challenging due to mastectomy. IMPRESSIONS   1. Endocardial borders are challenging. Left ventricular ejection fraction, by estimation, is 35 to 40%. The left ventricle has moderately decreased function. The left ventricle demonstrates global hypokinesis. Left ventricular diastolic parameters are indeterminate. 2. Right ventricular systolic function is mildly reduced. The right ventricular size is normal. There is mildly elevated pulmonary artery systolic pressure. The estimated right ventricular systolic pressure is 37.1 mmHg. 3. Left atrial size was  severely dilated. 4. The mitral valve was not well visualized. Mild mitral valve regurgitation. 5. Aortic valve regurgitation is not visualized. Aortic valve sclerosis/calcification is present, without any evidence of aortic stenosis. 6. The inferior vena cava is normal in size with greater than 50% respiratory variability, suggesting right atrial pressure of 3 mmHg.  FINDINGS Left Ventricle: Endocardial borders are challenging. Left ventricular ejection fraction, by estimation, is 35 to 40%. The left ventricle has moderately decreased function. The left ventricle demonstrates global hypokinesis. The left ventricular internal cavity size was normal in size. There is no left ventricular hypertrophy. Left ventricular diastolic parameters are indeterminate.  Right Ventricle: The right ventricular size is normal. Right ventricular systolic function is mildly reduced. There is mildly elevated pulmonary artery systolic pressure. The tricuspid regurgitant velocity is 2.92 m/s, and with an assumed right atrial pressure of 3 mmHg, the estimated right ventricular systolic pressure is 37.1 mmHg.  Left [facility]: Left atrial size was severely dilated.  Right [facility]: Right atrial size was normal in size.  Pericardium: There is no evidence of pericardial effusion.  Mitral Valve: The mitral valve was not  well visualized. Mild mitral valve regurgitation.  Tricuspid Valve: Tricuspid valve regurgitation is mild.  Aortic Valve: Aortic valve regurgitation is not visualized. Aortic valve sclerosis/calcification is present, without any evidence of aortic stenosis. Aortic valve mean gradient measures 4.5 mmHg. Aortic valve peak gradient measures 7.6 mmHg. Aortic valve area, by VTI measures 1.45 cm.  Pulmonic Valve: Pulmonic valve regurgitation is not visualized.  Aorta: The aortic root and ascending aorta are structurally normal, with no evidence of dilitation.  Venous: The inferior vena cava is normal in size  with greater than 50% respiratory variability, suggesting right atrial pressure of 3 mmHg.  IAS/Shunts: No atrial level shunt detected by color flow Doppler.  Additional Comments: A device lead is visualized.   LEFT VENTRICLE PLAX 2D LVIDd:         5.95 cm     Diastology LVIDs:         4.80 cm     LV e' medial:    5.11 cm/s LV PW:         1.05 cm     LV E/e' medial:  20.0 LV IVS:        1.05 cm     LV e' lateral:   10.40 cm/s LVOT diam:     1.80 cm     LV E/e' lateral: 9.8 LV SV:         39 LV SV Index:   24 LVOT Area:     2.54 cm  LV Volumes (MOD) LV vol d, MOD A2C: 79.6 ml LV vol d, MOD A4C: 69.1 ml LV vol s, MOD A2C: 55.5 ml LV vol s, MOD A4C: 44.8 ml LV SV MOD A2C:     24.1 ml LV SV MOD A4C:     69.1 ml LV SV MOD BP:      25.7 ml  RIGHT VENTRICLE RV Basal diam:  2.75 cm RV Mid diam:    2.20 cm RV S prime:     9.25 cm/s TAPSE (M-mode): 1.3 cm  LEFT ATRIUM             Index        RIGHT ATRIUM           Index LA diam:        3.80 cm 2.32 cm/m   RA Area:     11.40 cm LA Vol (A2C):   71.2 ml 43.40 ml/m  RA Volume:   25.00 ml  15.24 ml/m LA Vol (A4C):   89.9 ml 54.80 ml/m LA Biplane Vol: 79.7 ml 48.58 ml/m AORTIC VALVE AV Area (Vmax):    1.57 cm AV Area (Vmean):   1.42 cm AV Area (VTI):     1.45 cm AV Vmax:           138.00 cm/s AV Vmean:          95.550 cm/s AV VTI:            0.272 m AV Peak Grad:      7.6 mmHg AV Mean Grad:      4.5 mmHg LVOT Vmax:         84.90 cm/s LVOT Vmean:        53.500 cm/s LVOT VTI:          0.155 m LVOT/AV VTI ratio: 0.57  AORTA Ao Root diam: 1.95 cm Ao Asc diam:  2.60 cm  MITRAL VALVE  TRICUSPID VALVE MV Area (PHT): 3.89 cm       TR Peak grad:   34.1 mmHg MV Decel Time: 195 msec       TR Vmax:        292.00 cm/s MR Peak grad:    123.2 mmHg MR Mean grad:    81.0 mmHg    SHUNTS MR Vmax:         555.00 cm/s  Systemic VTI:  0.16 m MR Vmean:        429.0 cm/s   Systemic Diam: 1.80 cm MR PISA:          2.26 cm MR PISA Eff ROA: 13 mm MR PISA Radius:  0.60 cm MV E velocity: 102.00 cm/s MV A velocity: 81.80 cm/s MV E/A ratio:  1.25  Photographer signed by Ronal Ross Signature Date/Time: 03/25/2023/12:26:19 PM    Final               Risk Assessment/Calculations:            Physical Exam:   VS:  Ht 5' 2 (1.575 m)   Wt 123 lb 12.8 oz (56.2 kg)   BMI 22.64 kg/m    Wt Readings from Last 3 Encounters:  11/26/23 123 lb 12.8 oz (56.2 kg)  09/18/23 125 lb (56.7 kg)  08/21/23 125 lb 1.6 oz (56.7 kg)    GEN: Well nourished, thin, well developed in no acute distress NECK: No JVD; No carotid bruits CARDIAC: bradycardic, RRR, no murmurs, rubs, gallops RESPIRATORY:  Clear to auscultation without rales, wheezing or rhonchi  ABDOMEN: Soft, non-tender, non-distended EXTREMITIES:  No edema; No deformity   ASSESSMENT AND PLAN: .    HFrEF - Euvolemic and well compensated on exam. GDMT includes Lasix20mg  daily, Toprol  100mg  daily, Entresto  97-103mg  BID. Add Spironolactone  12.5mg  daily with BMP in 1-2 weeks.   Hx of  VT / Amiodarone  therapy - Previously on Sotalol  but had breakthrough NSVT per prior records. Continue Amiodarone  400mg  QD. Amiodarone  use and is managed by PCP.  Amiodarone  monitoring:  09/18/23 AST 25, ALT 12. Hypothyroidism predates Amiodarone  use and is managed by PCP. 04/2023 TSH 0.268 with normal T3/T4. Due for EP follow up, will route to scheduling team  CAD / HLD, LDL goal < 70 - Stable with no anginal symptoms. No indication for ischemic evaluation.  GDMT Toprol . Prior statin intolerance with myalgia.   [employer] single chamber ICD - Follows with Dr. Inocencio.   Metastatic breast cancer with liver involvement - Follows with Dr. Odean. Plans to forego additional chemotherapy.   Hypothyroidism - Predates amiodarone . Continue to follow with PCP.   HTN - Elevated in clinic. She reports white coat hypertension with home BP 150s prior to medications. She  will check her BP at home after her medications and check in via phone in 2 weeks. Add Spironolactone  12.5mg  daily with BMP in 1-2 weeks.        Dispo: follow up in 3 months  Signed, Reche GORMAN Finder, NP

## 2023-12-10 ENCOUNTER — Telehealth (HOSPITAL_BASED_OUTPATIENT_CLINIC_OR_DEPARTMENT_OTHER): Payer: Self-pay | Admitting: Family

## 2023-12-10 NOTE — Telephone Encounter (Signed)
Called to check in on blood pressure since our last office visit. Her last BP check was Friday was "very good" but does not recall any specific numbers.   She has established with hospice care. Doing all home visits. As pursuing hospice care, will defer repeat labs.  Alver Sorrow, NP

## 2023-12-14 ENCOUNTER — Ambulatory Visit: Payer: Medicare HMO | Attending: Physician Assistant | Admitting: Physician Assistant

## 2023-12-14 NOTE — Progress Notes (Deleted)
 Cardiology Office Note:  .   Date:  12/14/2023  ID:  Colleen Lowery, DOB 1941-01-12, MRN [id number] PCP: Colleen Kern, MD  [facility] Providers Cardiologist:  Chilton Si, MD Electrophysiologist:  Will Jorja Loa, MD {  History of Present Illness: .   Colleen Lowery is a 83 y.o. female w/PMHx of HTN, DM, and seizure d/o CAD, ICM, chronic CHF, VT, ICD, stroke, breast Ca (w/liver mets)  She saw Dr. Elberta Fortis 04/25/23, LUE swelling planned for [place] Left:  No evidence of superficial vein thrombosis in the upper extremity. Age  indeterminate DVT in one of the brachial veins.   Started on Eliquis  OCT 2024, device alerts for VTs > Toprol increased  Seeing cards a couple times since, last with Colleen Genera, NP 11/26/23, not as active with cold weather, on several OTC supplements, BPs 150's prior to meds Spironolactone was added Amio labs were UTD Planned for chemestry f/u labs     Today's visit is scheduled as a 44mo visit  ROS:   *** following at [facility] as well? *** VT? *** amio labs *** eliquis, dose, labs, bleeding *** symptoms, syncope   Device information MDT single chamber ICD implanted 02/16/21, RV lead from 08/17/2022  + appropriate therapies   She has had her ICD moved from the left to the right due to breast cancer and then back to the left due to pocket pain and tingling in her fingers.  This did improve her discomfort.  Despite this, with her left mastectomy and new device on the left side, she has had LUE swelling.   Initial device 2017 >> extracted 08/11/2020 (for breast cancer XRT New device implanted April 2022 >> significant pocket/arm pain Device extracted 08/17/22 >> back to left side with new lead (same can it seems)  Arrhythmia/AAD hx Sotalol (for VT) started April 2017 >> unclear when stopped > seems at least as of May 2024 Amiodarone started may 2024 for recurrent VT  Studies Reviewed: [name]    EKG not done today  DEVICE interrogation done today  and reviewed by myself *** Battery and lead measurements are good ***  TTE 03/25/2023   1. Endocardial borders are challenging. Left ventricular ejection  fraction, by estimation, is 35 to 40%. The left ventricle has moderately  decreased function. The left ventricle demonstrates global hypokinesis.  Left ventricular diastolic parameters are  indeterminate.   2. Right ventricular systolic function is mildly reduced. The right  ventricular size is normal. There is mildly elevated pulmonary artery  systolic pressure. The estimated right ventricular systolic pressure is  37.1 mmHg.   3. Left atrial size was severely dilated.   4. The mitral valve was not well visualized. Mild mitral valve  regurgitation.   5. Aortic valve regurgitation is not visualized. Aortic valve  sclerosis/calcification is present, without any evidence of aortic  stenosis.   6. The inferior vena cava is normal in size with greater than 50%  respiratory variability, suggesting right atrial pressure of 3 mmHg   CATH: 02/10/16    Conclusion     Severe single vessel CAD: Mid RCA to Dist RCA lesion, 100% stenosed. There is moderate left ventricular systolic dysfunction. Basal to mid inferior hypokinesis/akinesis Moderate to severe secondary pulmonary hypertension with elevated LVEDP and pulmonary Wedge pressure. Large PCWP V wave suggestive of potential ischemic MR Severe systemic hypertension     02/08/16 Echocardiogram Study Conclusions - Left ventricle: The cavity size was moderately to severely   dilated.  There was mild concentric hypertrophy. Systolic function   was mildly to moderately reduced. The estimated ejection fraction   was in the range of 40% to 45%. Mild hypokinesis of the inferior   and inferoseptal myocardium. Features are consistent with a   pseudonormal left ventricular filling pattern, with concomitant   abnormal relaxation and increased filling pressure (grade 2   diastolic dysfunction).  Doppler parameters are consistent with   elevated mean left atrial filling pressure. - Aortic valve: There was no stenosis. There was no regurgitation. - Mitral valve: Mobility of the anterior and posterior leaflet was   restricted. There was mild regurgitation. Effective regurgitant   orifice (PISA): 0.12 cm^2. - Left atrium: The atrium was severely dilated. - Right ventricle: The cavity size was normal. Wall thickness was   normal. Systolic function was normal. - Pulmonary arteries: Systolic pressure was mildly increased. PA   peak pressure: 44 mm Hg (S). - Inferior vena cava: The vessel was dilated. The respirophasic   diameter changes were in the normal range (>= 50%), consistent   with elevated central venous pressure. - Pericardium, extracardiac: There was a left pleural effusion.       Risk Assessment/Calculations:    Physical Exam:   VS:  There were no vitals taken for this visit.   Wt Readings from Last 3 Encounters:  11/26/23 123 lb 12.8 oz (56.2 kg)  09/18/23 125 lb (56.7 kg)  08/21/23 125 lb 1.6 oz (56.7 kg)    GEN: Well nourished, well developed in no acute distress NECK: No JVD; No carotid bruits CARDIAC: ***RRR, no murmurs, rubs, gallops RESPIRATORY:  *** CTA b/l without rales, wheezing or rhonchi  ABDOMEN: Soft, non-tender, non-distended EXTREMITIES:  No edema; No deformity   ICD site: *** is stable, no thinning, fluctuation, tethering  ASSESSMENT AND PLAN: .    VT Long hx Amiodarone ***  ICD *** intact function *** no programming changes made  CAD ICM Chronic CHF Just saw her cards team ***   LUE DVT On Eliquis ***    {Are you ordering a CV Procedure (e.g. stress test, cath, DCCV, TEE, etc)?   Press F2        :[id number]}     Dispo: ***  Signed, Sheilah Pigeon, PA-C

## 2023-12-17 ENCOUNTER — Encounter: Payer: Self-pay | Admitting: Physician Assistant

## 2023-12-18 NOTE — Progress Notes (Signed)
 Remote ICD transmission.

## 2024-01-09 ENCOUNTER — Encounter: Payer: Self-pay | Admitting: Hematology and Oncology

## 2024-01-19 ENCOUNTER — Emergency Department (HOSPITAL_COMMUNITY)

## 2024-01-19 ENCOUNTER — Encounter (HOSPITAL_COMMUNITY): Payer: Self-pay

## 2024-01-19 ENCOUNTER — Inpatient Hospital Stay (HOSPITAL_COMMUNITY)
Admission: EM | Admit: 2024-01-19 | Discharge: 2024-01-20 | DRG: 071 | Disposition: A | Discharge status: Hospice | Attending: Internal Medicine | Admitting: Internal Medicine

## 2024-01-19 DIAGNOSIS — Z170 Estrogen receptor positive status [ER+]: Secondary | ICD-10-CM

## 2024-01-19 DIAGNOSIS — R7401 Elevation of levels of liver transaminase levels: Secondary | ICD-10-CM | POA: Diagnosis present

## 2024-01-19 DIAGNOSIS — Z8249 Family history of ischemic heart disease and other diseases of the circulatory system: Secondary | ICD-10-CM

## 2024-01-19 DIAGNOSIS — G9340 Encephalopathy, unspecified: Secondary | ICD-10-CM | POA: Diagnosis not present

## 2024-01-19 DIAGNOSIS — C50412 Malignant neoplasm of upper-outer quadrant of left female breast: Secondary | ICD-10-CM

## 2024-01-19 DIAGNOSIS — Z602 Problems related to living alone: Secondary | ICD-10-CM | POA: Diagnosis present

## 2024-01-19 DIAGNOSIS — I4729 Other ventricular tachycardia: Secondary | ICD-10-CM | POA: Diagnosis present

## 2024-01-19 DIAGNOSIS — C787 Secondary malignant neoplasm of liver and intrahepatic bile duct: Secondary | ICD-10-CM | POA: Diagnosis present

## 2024-01-19 DIAGNOSIS — Z66 Do not resuscitate: Secondary | ICD-10-CM | POA: Diagnosis present

## 2024-01-19 DIAGNOSIS — K5090 Crohn's disease, unspecified, without complications: Secondary | ICD-10-CM | POA: Diagnosis present

## 2024-01-19 DIAGNOSIS — Z9012 Acquired absence of left breast and nipple: Secondary | ICD-10-CM

## 2024-01-19 DIAGNOSIS — E860 Dehydration: Secondary | ICD-10-CM | POA: Diagnosis present

## 2024-01-19 DIAGNOSIS — Z7983 Long term (current) use of bisphosphonates: Secondary | ICD-10-CM

## 2024-01-19 DIAGNOSIS — E039 Hypothyroidism, unspecified: Secondary | ICD-10-CM | POA: Diagnosis present

## 2024-01-19 DIAGNOSIS — Z79899 Other long term (current) drug therapy: Secondary | ICD-10-CM

## 2024-01-19 DIAGNOSIS — Z91040 Latex allergy status: Secondary | ICD-10-CM

## 2024-01-19 DIAGNOSIS — Z833 Family history of diabetes mellitus: Secondary | ICD-10-CM

## 2024-01-19 DIAGNOSIS — Z515 Encounter for palliative care: Secondary | ICD-10-CM

## 2024-01-19 DIAGNOSIS — R4182 Altered mental status, unspecified: Principal | ICD-10-CM

## 2024-01-19 DIAGNOSIS — Z888 Allergy status to other drugs, medicaments and biological substances status: Secondary | ICD-10-CM

## 2024-01-19 DIAGNOSIS — I110 Hypertensive heart disease with heart failure: Secondary | ICD-10-CM | POA: Diagnosis present

## 2024-01-19 DIAGNOSIS — I5022 Chronic systolic (congestive) heart failure: Secondary | ICD-10-CM | POA: Diagnosis present

## 2024-01-19 DIAGNOSIS — Z9109 Other allergy status, other than to drugs and biological substances: Secondary | ICD-10-CM

## 2024-01-19 DIAGNOSIS — K219 Gastro-esophageal reflux disease without esophagitis: Secondary | ICD-10-CM | POA: Diagnosis present

## 2024-01-19 DIAGNOSIS — F17210 Nicotine dependence, cigarettes, uncomplicated: Secondary | ICD-10-CM | POA: Diagnosis present

## 2024-01-19 DIAGNOSIS — Z1152 Encounter for screening for COVID-19: Secondary | ICD-10-CM

## 2024-01-19 DIAGNOSIS — Z853 Personal history of malignant neoplasm of breast: Secondary | ICD-10-CM

## 2024-01-19 DIAGNOSIS — Z91041 Radiographic dye allergy status: Secondary | ICD-10-CM

## 2024-01-19 DIAGNOSIS — G40909 Epilepsy, unspecified, not intractable, without status epilepticus: Secondary | ICD-10-CM | POA: Diagnosis present

## 2024-01-19 DIAGNOSIS — E119 Type 2 diabetes mellitus without complications: Secondary | ICD-10-CM | POA: Diagnosis present

## 2024-01-19 DIAGNOSIS — I472 Ventricular tachycardia, unspecified: Secondary | ICD-10-CM | POA: Diagnosis present

## 2024-01-19 DIAGNOSIS — E876 Hypokalemia: Secondary | ICD-10-CM | POA: Diagnosis present

## 2024-01-19 DIAGNOSIS — D72829 Elevated white blood cell count, unspecified: Secondary | ICD-10-CM | POA: Diagnosis present

## 2024-01-19 DIAGNOSIS — Z8673 Personal history of transient ischemic attack (TIA), and cerebral infarction without residual deficits: Secondary | ICD-10-CM

## 2024-01-19 DIAGNOSIS — Z860100 Personal history of colon polyps, unspecified: Secondary | ICD-10-CM

## 2024-01-19 DIAGNOSIS — Z95810 Presence of automatic (implantable) cardiac defibrillator: Secondary | ICD-10-CM

## 2024-01-19 DIAGNOSIS — Z823 Family history of stroke: Secondary | ICD-10-CM

## 2024-01-19 DIAGNOSIS — I5042 Chronic combined systolic (congestive) and diastolic (congestive) heart failure: Secondary | ICD-10-CM | POA: Diagnosis present

## 2024-01-19 DIAGNOSIS — I2510 Atherosclerotic heart disease of native coronary artery without angina pectoris: Secondary | ICD-10-CM | POA: Diagnosis present

## 2024-01-19 DIAGNOSIS — F32A Depression, unspecified: Secondary | ICD-10-CM | POA: Diagnosis present

## 2024-01-19 DIAGNOSIS — Z87898 Personal history of other specified conditions: Secondary | ICD-10-CM

## 2024-01-19 DIAGNOSIS — Z90710 Acquired absence of both cervix and uterus: Secondary | ICD-10-CM

## 2024-01-19 LAB — URINALYSIS, ROUTINE W REFLEX MICROSCOPIC
Bacteria, UA: NONE SEEN
Glucose, UA: NEGATIVE mg/dL
Hgb urine dipstick: NEGATIVE
Ketones, ur: NEGATIVE mg/dL
Leukocytes,Ua: NEGATIVE
Nitrite: NEGATIVE
Protein, ur: 30 mg/dL — AB
Specific Gravity, Urine: 1.019 (ref 1.005–1.030)
pH: 6 (ref 5.0–8.0)

## 2024-01-19 LAB — CBC WITH DIFFERENTIAL/PLATELET
Abs Immature Granulocytes: 0.08 10*3/uL — ABNORMAL HIGH (ref 0.00–0.07)
Basophils Absolute: 0 10*3/uL (ref 0.0–0.1)
Basophils Relative: 0 %
Eosinophils Absolute: 0 10*3/uL (ref 0.0–0.5)
Eosinophils Relative: 0 %
HCT: 39.1 % (ref 36.0–46.0)
Hemoglobin: 12.8 g/dL (ref 12.0–15.0)
Immature Granulocytes: 1 %
Lymphocytes Relative: 6 %
Lymphs Abs: 0.8 10*3/uL (ref 0.7–4.0)
MCH: 30.3 pg (ref 26.0–34.0)
MCHC: 32.7 g/dL (ref 30.0–36.0)
MCV: 92.4 fL (ref 80.0–100.0)
Monocytes Absolute: 1.1 10*3/uL — ABNORMAL HIGH (ref 0.1–1.0)
Monocytes Relative: 9 %
Neutro Abs: 11.4 10*3/uL — ABNORMAL HIGH (ref 1.7–7.7)
Neutrophils Relative %: 84 %
Platelets: 188 10*3/uL (ref 150–400)
RBC: 4.23 MIL/uL (ref 3.87–5.11)
RDW: 21.3 % — ABNORMAL HIGH (ref 11.5–15.5)
WBC: 13.5 10*3/uL — ABNORMAL HIGH (ref 4.0–10.5)
nRBC: 0 % (ref 0.0–0.2)

## 2024-01-19 LAB — COMPREHENSIVE METABOLIC PANEL
ALT: 51 U/L — ABNORMAL HIGH (ref 0–44)
AST: 149 U/L — ABNORMAL HIGH (ref 15–41)
Albumin: 2.3 g/dL — ABNORMAL LOW (ref 3.5–5.0)
Alkaline Phosphatase: 349 U/L — ABNORMAL HIGH (ref 38–126)
Anion gap: 12 (ref 5–15)
BUN: 27 mg/dL — ABNORMAL HIGH (ref 8–23)
CO2: 25 mmol/L (ref 22–32)
Calcium: 8.2 mg/dL — ABNORMAL LOW (ref 8.9–10.3)
Chloride: 102 mmol/L (ref 98–111)
Creatinine, Ser: 0.91 mg/dL (ref 0.44–1.00)
GFR, Estimated: >60 mL/min (ref >60)
Glucose, Bld: 140 mg/dL — ABNORMAL HIGH (ref 70–99)
Potassium: 3.3 mmol/L — ABNORMAL LOW (ref 3.5–5.1)
Sodium: 139 mmol/L (ref 135–145)
Total Bilirubin: 7.7 mg/dL — ABNORMAL HIGH (ref 0.0–1.2)
Total Protein: 5.8 g/dL — ABNORMAL LOW (ref 6.5–8.1)

## 2024-01-19 LAB — ETHANOL: Alcohol, Ethyl (B): <10 mg/dL (ref <10)

## 2024-01-19 LAB — RESP PANEL BY RT-PCR (RSV, FLU A&B, COVID)  RVPGX2
Influenza A by PCR: NEGATIVE
Influenza B by PCR: NEGATIVE
Resp Syncytial Virus by PCR: NEGATIVE
SARS Coronavirus 2 by RT PCR: NEGATIVE

## 2024-01-19 LAB — CK: Total CK: 84 U/L (ref 38–234)

## 2024-01-19 LAB — AMMONIA: Ammonia: 41 umol/L — ABNORMAL HIGH (ref 9–35)

## 2024-01-19 LAB — CBG MONITORING, ED: Glucose-Capillary: 105 mg/dL — ABNORMAL HIGH (ref 70–99)

## 2024-01-19 LAB — LACTIC ACID, PLASMA: Lactic Acid, Venous: 1.4 mmol/L (ref 0.5–1.9)

## 2024-01-19 MED ORDER — SODIUM CHLORIDE 0.9 % IV BOLUS
1000.0000 mL | Freq: Once | INTRAVENOUS | Status: AC
  Administered 2024-01-19: 1000 mL via INTRAVENOUS

## 2024-01-19 NOTE — ED Provider Notes (Signed)
 [place] [facility] DEPARTMENT AT [facility] Provider Note   CSN: [id number] Arrival date & time: 01/19/24  2102     History  Chief Complaint  Patient presents with   Altered Mental Status    Colleen Lowery is a 83 y.o. female.  83 yo F with a chief complaints of altered mental status.  Patient is normally awake and alert and able to communicate send now has been mumbling.  No other obvious change per the hospice nurse.  Level 5 caveat altered mental status.   Altered Mental Status      Home Medications Prior to Admission medications   Medication Sig Start Date End Date Taking? Authorizing Provider  alendronate (FOSAMAX) 70 MG tablet Take 1 tablet (70 mg total) by mouth once a week. Take with a full glass of water on an empty stomach. 06/25/23   Serena Croissant, MD  amiodarone (PACERONE) 400 MG tablet Take 1 tablet (400 mg total) by mouth daily. 04/12/23 04/06/24  Alver Sorrow, NP  bisacodyl (DULCOLAX) 5 MG EC tablet Take 5 mg by mouth daily as needed for moderate constipation.    [provider]  furosemide (LASIX) 20 MG tablet Take 20 mg by mouth daily.    [provider]  levETIRAcetam (KEPPRA) 500 MG tablet Take 1 tablet (500 mg total) by mouth 3 (three) times daily. 07/26/23 07/20/24  Windell Norfolk, MD  lidocaine-prilocaine (EMLA) cream Apply to affected area once Patient taking differently: Apply 1 Application topically daily as needed (For pain). 01/08/23   Serena Croissant, MD  liothyronine (CYTOMEL) 25 MCG tablet Take 25 mcg by mouth daily. 01/08/23   [provider]  metoprolol succinate (TOPROL-XL) 100 MG 24 hr tablet Take 1 tablet (100 mg total) by mouth in the morning and at bedtime. Take with or immediately following a meal. 08/31/23   Camnitz, Andree Coss, MD  potassium chloride SA (K-DUR,KLOR-CON) 20 MEQ tablet Take 20 mEq by mouth 2 (two) times daily.    [provider]  predniSONE (DELTASONE) 50 MG tablet Take 1 tablet by  mouth 12 hours before scan, 1 tab 7 hours before and 1 hour before scan 08/08/23   Serena Croissant, MD  PROLENSA 0.07 % SOLN Place 1 drop into the right eye 4 (four) times daily. 08/15/22   [provider]  Propylene Glycol (SYSTANE BALANCE) 0.6 % SOLN Place 2 drops into the right eye in the morning and at bedtime.    [provider]  sacubitril-valsartan (ENTRESTO) 97-103 MG Take 1 tablet by mouth 2 (two) times daily. 04/30/23   Alver Sorrow, NP  spironolactone (ALDACTONE) 25 MG tablet Take 0.5 tablets (12.5 mg total) by mouth daily. 11/26/23 02/24/24  Alver Sorrow, NP  TRUE METRIX BLOOD GLUCOSE TEST test strip SMARTSIG:Via Meter 09/28/22   [provider]  TRUEplus Lancets 33G MISC  09/28/22   [provider]      Allergies    Alcohol, Contrast media [iodinated contrast media], Iodine, Atorvastatin, Dust mite extract, Latex, Lisinopril, Molds & smuts, and Wound dressing adhesive    Review of Systems   Review of Systems  Physical Exam Updated Vital Signs BP (!) 166/87   Pulse (!) 58   Temp 98.1 F (36.7 C) (Axillary)   Resp 13   SpO2 97%  Physical Exam Vitals and nursing note reviewed.  Constitutional:      General: She is not in acute distress.    Appearance: She is well-developed. She is  not diaphoretic.  HENT:     Head: Normocephalic and atraumatic.  Eyes:     Pupils: Pupils are equal, round, and reactive to light.  Cardiovascular:     Rate and Rhythm: Normal rate and regular rhythm.     Heart sounds: No murmur heard.    No friction rub. No gallop.  Pulmonary:     Effort: Pulmonary effort is normal.     Breath sounds: No wheezing or rales.  Abdominal:     General: There is no distension.     Palpations: Abdomen is soft.     Tenderness: There is no abdominal tenderness.  Musculoskeletal:        General: No tenderness.     Cervical back: Normal range of motion and neck supple.  Skin:    General: Skin is warm and dry.   Neurological:     Mental Status: She is alert.     Comments: Patient mumbles incoherently.  Psychiatric:        Behavior: Behavior normal.     ED Results / Procedures / Treatments   Labs (all labs ordered are listed, but only abnormal results are displayed) Labs Reviewed  RESP PANEL BY RT-PCR (RSV, FLU A&B, COVID)  RVPGX2  AMMONIA  COMPREHENSIVE METABOLIC PANEL  CBC WITH DIFFERENTIAL/PLATELET  URINALYSIS, ROUTINE W REFLEX MICROSCOPIC  ETHANOL  LACTIC ACID, PLASMA  LACTIC ACID, PLASMA  CBG MONITORING, ED    EKG None  Radiology No results found.  Procedures Procedures    Medications Ordered in ED Medications  sodium chloride 0.9 % bolus 1,000 mL (has no administration in time range)    ED Course/ Medical Decision Making/ A&P                                 Medical Decision Making Amount and/or Complexity of Data Reviewed Labs: ordered. Radiology: ordered.   60 yoF with a chief complaint of altered mental status.  This was noticed in the past 24 to 48 hours.  She is currently on hospice.  She was sent here for evaluation.  Will obtain an altered mental status workup.  CT head without obvious acute pathology.  CXR without obvious focal infiltrate or ptx on my independent interpretation.   Signed out to Dr. Eudelia Bunch, please see their note for further details of care.  The patients results and plan were reviewed and discussed.   Any x-rays performed were independently reviewed by myself.   Differential diagnosis were considered with the presenting HPI.  Medications  sodium chloride 0.9 % bolus 1,000 mL (1,000 mLs Intravenous New Bag/Given 01/19/24 2316)    Vitals:   01/19/24 2122 01/19/24 2315  BP: (!) 166/87   Pulse: (!) 58   Resp: 13   Temp: 98.1 F (36.7 C) (!) 97.1 F (36.2 C)  TempSrc: Axillary Rectal  SpO2: 97%     Final diagnoses:  None           Final Clinical Impression(s) / ED Diagnoses Final diagnoses:  None    Rx / DC  Orders ED Discharge Orders     None         Melene Plan, DO 01/19/24 2323

## 2024-01-19 NOTE — ED Triage Notes (Signed)
 Pt is a hospice pt coming from home. Lives alone. Friend went to check on her this evening and found her minimally responsive. 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 Normally A&Ox4 and able to make own decision. Incomprehensible speech during triage.   CBG 123

## 2024-01-20 ENCOUNTER — Emergency Department (HOSPITAL_COMMUNITY)

## 2024-01-20 ENCOUNTER — Encounter (HOSPITAL_COMMUNITY): Payer: Self-pay | Admitting: Internal Medicine

## 2024-01-20 DIAGNOSIS — Z95810 Presence of automatic (implantable) cardiac defibrillator: Secondary | ICD-10-CM | POA: Diagnosis not present

## 2024-01-20 DIAGNOSIS — E876 Hypokalemia: Secondary | ICD-10-CM | POA: Diagnosis present

## 2024-01-20 DIAGNOSIS — G40909 Epilepsy, unspecified, not intractable, without status epilepticus: Secondary | ICD-10-CM | POA: Diagnosis present

## 2024-01-20 DIAGNOSIS — Z1152 Encounter for screening for COVID-19: Secondary | ICD-10-CM | POA: Diagnosis not present

## 2024-01-20 DIAGNOSIS — I5022 Chronic systolic (congestive) heart failure: Secondary | ICD-10-CM | POA: Diagnosis not present

## 2024-01-20 DIAGNOSIS — Z515 Encounter for palliative care: Secondary | ICD-10-CM

## 2024-01-20 DIAGNOSIS — Z87898 Personal history of other specified conditions: Secondary | ICD-10-CM | POA: Diagnosis not present

## 2024-01-20 DIAGNOSIS — Z170 Estrogen receptor positive status [ER+]: Secondary | ICD-10-CM

## 2024-01-20 DIAGNOSIS — Z8673 Personal history of transient ischemic attack (TIA), and cerebral infarction without residual deficits: Secondary | ICD-10-CM | POA: Diagnosis not present

## 2024-01-20 DIAGNOSIS — I5042 Chronic combined systolic (congestive) and diastolic (congestive) heart failure: Secondary | ICD-10-CM | POA: Diagnosis present

## 2024-01-20 DIAGNOSIS — Z8249 Family history of ischemic heart disease and other diseases of the circulatory system: Secondary | ICD-10-CM | POA: Diagnosis not present

## 2024-01-20 DIAGNOSIS — E039 Hypothyroidism, unspecified: Secondary | ICD-10-CM | POA: Diagnosis present

## 2024-01-20 DIAGNOSIS — Z79899 Other long term (current) drug therapy: Secondary | ICD-10-CM | POA: Diagnosis not present

## 2024-01-20 DIAGNOSIS — E860 Dehydration: Secondary | ICD-10-CM | POA: Diagnosis present

## 2024-01-20 DIAGNOSIS — E119 Type 2 diabetes mellitus without complications: Secondary | ICD-10-CM | POA: Diagnosis present

## 2024-01-20 DIAGNOSIS — C787 Secondary malignant neoplasm of liver and intrahepatic bile duct: Secondary | ICD-10-CM | POA: Diagnosis present

## 2024-01-20 DIAGNOSIS — C50412 Malignant neoplasm of upper-outer quadrant of left female breast: Secondary | ICD-10-CM

## 2024-01-20 DIAGNOSIS — Z9012 Acquired absence of left breast and nipple: Secondary | ICD-10-CM | POA: Diagnosis not present

## 2024-01-20 DIAGNOSIS — K5090 Crohn's disease, unspecified, without complications: Secondary | ICD-10-CM | POA: Diagnosis present

## 2024-01-20 DIAGNOSIS — Z7189 Other specified counseling: Secondary | ICD-10-CM | POA: Diagnosis not present

## 2024-01-20 DIAGNOSIS — R7401 Elevation of levels of liver transaminase levels: Secondary | ICD-10-CM | POA: Diagnosis present

## 2024-01-20 DIAGNOSIS — G9340 Encephalopathy, unspecified: Secondary | ICD-10-CM | POA: Diagnosis present

## 2024-01-20 DIAGNOSIS — D72829 Elevated white blood cell count, unspecified: Secondary | ICD-10-CM | POA: Diagnosis present

## 2024-01-20 DIAGNOSIS — I2510 Atherosclerotic heart disease of native coronary artery without angina pectoris: Secondary | ICD-10-CM | POA: Diagnosis present

## 2024-01-20 DIAGNOSIS — I110 Hypertensive heart disease with heart failure: Secondary | ICD-10-CM | POA: Diagnosis present

## 2024-01-20 DIAGNOSIS — I472 Ventricular tachycardia, unspecified: Secondary | ICD-10-CM | POA: Diagnosis present

## 2024-01-20 DIAGNOSIS — F32A Depression, unspecified: Secondary | ICD-10-CM | POA: Diagnosis present

## 2024-01-20 DIAGNOSIS — F17210 Nicotine dependence, cigarettes, uncomplicated: Secondary | ICD-10-CM | POA: Diagnosis present

## 2024-01-20 DIAGNOSIS — I4729 Other ventricular tachycardia: Secondary | ICD-10-CM

## 2024-01-20 DIAGNOSIS — Z66 Do not resuscitate: Secondary | ICD-10-CM | POA: Diagnosis present

## 2024-01-20 LAB — CBC WITH DIFFERENTIAL/PLATELET
Abs Immature Granulocytes: 0.09 10*3/uL — ABNORMAL HIGH (ref 0.00–0.07)
Basophils Absolute: 0 10*3/uL (ref 0.0–0.1)
Basophils Relative: 0 %
Eosinophils Absolute: 0 10*3/uL (ref 0.0–0.5)
Eosinophils Relative: 0 %
HCT: 38.7 % (ref 36.0–46.0)
Hemoglobin: 12.1 g/dL (ref 12.0–15.0)
Immature Granulocytes: 1 %
Lymphocytes Relative: 8 %
Lymphs Abs: 0.9 10*3/uL (ref 0.7–4.0)
MCH: 30 pg (ref 26.0–34.0)
MCHC: 31.3 g/dL (ref 30.0–36.0)
MCV: 95.8 fL (ref 80.0–100.0)
Monocytes Absolute: 1.2 10*3/uL — ABNORMAL HIGH (ref 0.1–1.0)
Monocytes Relative: 10 %
Neutro Abs: 9.6 10*3/uL — ABNORMAL HIGH (ref 1.7–7.7)
Neutrophils Relative %: 81 %
Platelets: 180 10*3/uL (ref 150–400)
RBC: 4.04 MIL/uL (ref 3.87–5.11)
RDW: 21.1 % — ABNORMAL HIGH (ref 11.5–15.5)
WBC: 11.9 10*3/uL — ABNORMAL HIGH (ref 4.0–10.5)
nRBC: 0 % (ref 0.0–0.2)

## 2024-01-20 LAB — BLOOD GAS, ARTERIAL
Acid-base deficit: 0.4 mmol/L (ref 0.0–2.0)
Bicarbonate: 22.9 mmol/L (ref 20.0–28.0)
Drawn by: 331471
O2 Saturation: 99.6 %
Patient temperature: 37
pCO2 arterial: 33 mmHg (ref 32–48)
pH, Arterial: 7.45 (ref 7.35–7.45)
pO2, Arterial: 135 mmHg — ABNORMAL HIGH (ref 83–108)

## 2024-01-20 LAB — COMPREHENSIVE METABOLIC PANEL
ALT: 44 U/L (ref 0–44)
AST: 113 U/L — ABNORMAL HIGH (ref 15–41)
Albumin: 2.1 g/dL — ABNORMAL LOW (ref 3.5–5.0)
Alkaline Phosphatase: 294 U/L — ABNORMAL HIGH (ref 38–126)
Anion gap: 11 (ref 5–15)
BUN: 25 mg/dL — ABNORMAL HIGH (ref 8–23)
CO2: 25 mmol/L (ref 22–32)
Calcium: 7.7 mg/dL — ABNORMAL LOW (ref 8.9–10.3)
Chloride: 104 mmol/L (ref 98–111)
Creatinine, Ser: 0.98 mg/dL (ref 0.44–1.00)
GFR, Estimated: 58 mL/min — ABNORMAL LOW (ref >60)
Glucose, Bld: 119 mg/dL — ABNORMAL HIGH (ref 70–99)
Potassium: 3.6 mmol/L (ref 3.5–5.1)
Sodium: 140 mmol/L (ref 135–145)
Total Bilirubin: 7.5 mg/dL — ABNORMAL HIGH (ref 0.0–1.2)
Total Protein: 5.2 g/dL — ABNORMAL LOW (ref 6.5–8.1)

## 2024-01-20 LAB — AMMONIA: Ammonia: 42 umol/L — ABNORMAL HIGH (ref 9–35)

## 2024-01-20 LAB — VITAMIN B12: Vitamin B-12: 7134 pg/mL — ABNORMAL HIGH (ref 180–914)

## 2024-01-20 LAB — BLOOD GAS, VENOUS
Acid-Base Excess: 2.4 mmol/L — ABNORMAL HIGH (ref 0.0–2.0)
Bicarbonate: 27.2 mmol/L (ref 20.0–28.0)
O2 Saturation: 71.1 %
Patient temperature: 37
pCO2, Ven: 42 mmHg — ABNORMAL LOW (ref 44–60)
pH, Ven: 7.42 (ref 7.25–7.43)
pO2, Ven: 44 mmHg (ref 32–45)

## 2024-01-20 LAB — PROTIME-INR
INR: 1.3 — ABNORMAL HIGH (ref 0.8–1.2)
Prothrombin Time: 16.8 s — ABNORMAL HIGH (ref 11.4–15.2)

## 2024-01-20 LAB — CBG MONITORING, ED: Glucose-Capillary: 93 mg/dL (ref 70–99)

## 2024-01-20 LAB — APTT: aPTT: 29 s (ref 24–36)

## 2024-01-20 LAB — BILIRUBIN, DIRECT: Bilirubin, Direct: 4.7 mg/dL — ABNORMAL HIGH (ref 0.0–0.2)

## 2024-01-20 LAB — HEMOGLOBIN A1C
Hgb A1c MFr Bld: 5.2 % (ref 4.8–5.6)
Mean Plasma Glucose: 102.54 mg/dL

## 2024-01-20 LAB — MAGNESIUM: Magnesium: 1.9 mg/dL (ref 1.7–2.4)

## 2024-01-20 LAB — GAMMA GT: GGT: 380 U/L — ABNORMAL HIGH (ref 7–50)

## 2024-01-20 LAB — TSH: TSH: 3.361 u[IU]/mL (ref 0.350–4.500)

## 2024-01-20 MED ORDER — MORPHINE SULFATE (PF) 2 MG/ML IV SOLN
2.0000 mg | INTRAVENOUS | Status: DC
  Administered 2024-01-20 (×2): 2 mg via INTRAVENOUS
  Filled 2024-01-20 (×2): qty 1

## 2024-01-20 MED ORDER — POLYVINYL ALCOHOL 1.4 % OP SOLN: 1.0000 [drp] | Freq: Four times a day (QID) | OPHTHALMIC | Status: DC | PRN

## 2024-01-20 MED ORDER — LORAZEPAM 2 MG/ML IJ SOLN: 0.5000 mg | INTRAMUSCULAR | Status: DC | PRN

## 2024-01-20 MED ORDER — LEVETIRACETAM IN NACL 500 MG/100ML IV SOLN: 500.0000 mg | Freq: Three times a day (TID) | INTRAVENOUS | Status: DC

## 2024-01-20 MED ORDER — POTASSIUM CHLORIDE 10 MEQ/100ML IV SOLN
10.0000 meq | INTRAVENOUS | Status: DC
  Administered 2024-01-20 (×2): 10 meq via INTRAVENOUS
  Filled 2024-01-20 (×2): qty 100

## 2024-01-20 MED ORDER — ACETAMINOPHEN 650 MG RE SUPP: 650.0000 mg | Freq: Four times a day (QID) | RECTAL | Status: DC | PRN

## 2024-01-20 MED ORDER — GLYCOPYRROLATE 0.2 MG/ML IJ SOLN: 0.2000 mg | INTRAMUSCULAR | Status: DC | PRN

## 2024-01-20 MED ORDER — MORPHINE SULFATE (PF) 2 MG/ML IV SOLN: 2.0000 mg | INTRAVENOUS | Status: DC | PRN

## 2024-01-20 MED ORDER — LORAZEPAM 2 MG/ML IJ SOLN
0.5000 mg | INTRAMUSCULAR | Status: DC | PRN
Start: 2024-01-20 — End: 2024-02-26

## 2024-01-20 MED ORDER — LEVETIRACETAM IN NACL 500 MG/100ML IV SOLN
500.0000 mg | Freq: Three times a day (TID) | INTRAVENOUS | Status: DC
  Administered 2024-01-20 (×2): 500 mg via INTRAVENOUS
  Filled 2024-01-20 (×2): qty 100

## 2024-01-20 MED ORDER — MORPHINE SULFATE (PF) 2 MG/ML IV SOLN
2.0000 mg | INTRAVENOUS | Status: DC
Start: 2024-01-20 — End: 2024-02-26

## 2024-01-20 MED ORDER — ONDANSETRON HCL 4 MG/2ML IJ SOLN: 4.0000 mg | Freq: Four times a day (QID) | INTRAMUSCULAR | Status: DC | PRN

## 2024-01-20 MED ORDER — LIOTHYRONINE SODIUM 25 MCG PO TABS
25.0000 ug | ORAL_TABLET | Freq: Every day | ORAL | Status: DC
  Administered 2024-01-20: 25 ug via ORAL
  Filled 2024-01-20: qty 1

## 2024-01-20 MED ORDER — BIOTENE DRY MOUTH MT LIQD: 15.0000 mL | OROMUCOSAL | Status: DC | PRN

## 2024-01-20 MED ORDER — AMIODARONE HCL 200 MG PO TABS
400.0000 mg | ORAL_TABLET | Freq: Every day | ORAL | Status: DC
  Administered 2024-01-20: 400 mg via ORAL
  Filled 2024-01-20: qty 2

## 2024-01-20 MED ORDER — INSULIN ASPART 100 UNIT/ML IJ SOLN
0.0000 [IU] | Freq: Three times a day (TID) | INTRAMUSCULAR | Status: DC
  Filled 2024-01-20: qty 0.06

## 2024-01-20 MED ORDER — ACETAMINOPHEN 325 MG PO TABS: 650.0000 mg | ORAL_TABLET | Freq: Four times a day (QID) | ORAL | Status: DC | PRN

## 2024-01-20 MED ORDER — GLYCOPYRROLATE 1 MG PO TABS: 1.0000 mg | ORAL_TABLET | ORAL | Status: DC | PRN

## 2024-01-20 MED ORDER — SACUBITRIL-VALSARTAN 97-103 MG PO TABS
1.0000 | ORAL_TABLET | Freq: Two times a day (BID) | ORAL | Status: DC
  Administered 2024-01-20: 1 via ORAL
  Filled 2024-01-20 (×2): qty 1

## 2024-01-20 MED ORDER — METOPROLOL SUCCINATE ER 50 MG PO TB24
100.0000 mg | ORAL_TABLET | Freq: Every day | ORAL | Status: DC
  Administered 2024-01-20: 100 mg via ORAL
  Filled 2024-01-20: qty 2

## 2024-01-20 NOTE — Progress Notes (Addendum)
 RT called to room WA06 for second set of eyes on pt RN at bedside. Pt was on 4 LPM [place] RT increased to 6 LPM. Pt has very poor nail beds with 02 only picking up periodically. No distress noted  pt very confused due to her baseline dementia. No further orders for RT at this time. Pt current sats on 6 LPM [place] 91%.

## 2024-01-20 NOTE — ED Provider Notes (Signed)
 I assumed care of this patient from previous provider.  Please see their note for further details of history, exam, and MDM.   Briefly patient is a 83 y.o. female who presented AMS. Pending labs and imaging.  Workup notable for progression of patient's known metastatic liver disease.  Spoke with brother who confirmed patient is on hospice. Plans to drive in from [place] in the morning. Simmons,Geoffrey  [phone]   Son: Kaianna Dolezal - [phone]  Will admit for hospice placement.   Nira Conn, MD 01/20/24 [phone]

## 2024-01-20 NOTE — Hospital Course (Addendum)
 83 year old female with past medical history of coronary artery disease with occluded RCA, systolic and diastolic congestive heart failure (Echo 03/2023 EF 35-40%, G2DD on 2017 Echo), status post Med[employer] single-chamber ICD placement (follows with Dr. Elberta Fortis), history of ventricular tachycardia, stage IV breast cancer (follows with Dr. Pamelia Hoit) with liver metastases, seizure disorder, prior stroke, diabetes mellitus type 2, essential hypertension, hypothyroidism who presents to [facility] emergency department for confusion identified by her brother.  Upon evaluation in the emergency department patient underwent CT imaging of the abdomen and pelvis which revealed significant interval \\progression  of widespread Metastatic disease and possible choledocholithiasis.  The hospitalist group was called to assess the patient for admission the hospital for encephalopathy workup.  Of note, palliative care consultation was placed for further discussion surrounding patient's goals of care.    With assistance of palliative care, lengthy discussions were undertaken with the patient's son Catha Nottingham surrounding the patient's rapidly worsening prognosis.  The son decided that considering the patient's progressive decline it would be in the best patient's best interest to focus on comfort measures at this time.  Arrangements were made for the patient to be discharged to an inpatient hospice bed with [facility] [place] in stable condition on 01/20/2024.

## 2024-01-20 NOTE — ED Notes (Signed)
 [place] at bedside

## 2024-01-20 NOTE — Consult Note (Addendum)
 Palliative Medicine Inpatient Consult Note  Consulting Provider: Angie Fava, DO   Reason for consult:   [facility] Palliative Medicine Consult  Reason for Consult? assistance with clarification of goals of care; patient is on hospice, but is not comfort care;   Significant interval worsening of metastatic disease to the liver, potentially resulting in biliary ductal obstruction    01/20/2024  HPI:  Per intake H&P --> Colleen Lowery is a 83 y.o. female with medical history significant for left-sided breast cancer with metastatic disease to the liver, type 2 diabetes mellitus, chronic systolic heart failure, essential hypertension, acquired hypothyroidism, seizures, paroxysmal ventricular tachycardia status post ICD in 2017, who is admitted to [facility] on 01/19/2024 with acute encephalopathy after presenting from home to [facility] ED for evaluation of altered mental status. Palliative care asked to clarify goals of care in the setting of [facility] being [facility].  Clinical Assessment/Goals of Care:  *Please note that this is a verbal dictation therefore any spelling or grammatical errors are due to the "Dragon Medical One" system interpretation.  I have reviewed medical records including EPIC notes, labs and imaging, received report from bedside RN, assessed the patient who is encephalopathic.    I called patients son, Colleen Lowery to further discuss diagnosis prognosis, GOC, EOL wishes, disposition and options.   I introduced Palliative Medicine as specialized medical care for people living with serious illness. It focuses on providing relief from the symptoms and stress of a serious illness. The goal is to improve quality of life for both the patient and the family.  Medical History Review and Understanding:  A review of patients past medical history significant for metastatic breast cancer to the liver, Type 2 Diabetes, systolic heart failure,  HTN, hypothyroidism and seizures was completed.   Social History:  Colleen Lowery has been widowed for several decades and has one adult son who lives in [place]. Her support system includes friends, two sisters, and one brother. No pets in the home. Colleen Lowery is currently retired. 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   Functional and Nutritional State:  Colleen Lowery was formerly living along and caring for herself. She had neighbors who would incrementally check in on her.   Advance Directives:  A detailed discussion was had today regarding advanced directives.  Patients brother Colleen Lowery is her first surrogate decision maker and son, Colleen Lowery is her second Runner, broadcasting/film/video.   Code Status:  Concepts specific to code status, artifical feeding and hydration, continued IV antibiotics and rehospitalization was had.  The difference between a aggressive medical intervention path  and a palliative comfort care path for this patient at this time was had.   Colleen Lowery is an established DNAR/DNI code status.   Discussion:  Patients son, Colleen Lowery and I spoke as having made several attempts to call patients brother without success. A review of patients present medical problems inclusive of her metastatic breast cancer was held. We discussed the goal at this time given all that she has been through is comfort.   We talked about transition to comfort measures in house and what that would entail inclusive of medications to control pain, dyspnea, agitation, nausea, itching, and hiccups.   We discussed stopping all uneccessary measures such as cardiac monitoring, blood draws, needle sticks, and frequent vital signs. Patient son is very understanding and  shares that these were the patients decisions. She did not want anymore aggressive treatments or therapies.   We discussed the idea of  [facility] which patients son also agrees with.   Discussed the importance of continued conversation with family and their  medical providers regarding overall plan of care and treatment options, ensuring decisions are within the context of the patients values and GOCs. ______________________________________  Addendum:  I met at bedside with patients brother, Colleen Lowery. We reviewed the above. He is in agreement with full comfort measures, around the clock morphine for symptoms, and transfer to [facility] inpatient hospice.  Decision Maker: Colleen Lowery,Colleen Lowery (Brother): [phone] ([phone])  Colleen Lowery, Colleen Lowery (Son): [phone]  SUMMARY OF RECOMMENDATIONS   DNAR/DNI   Comfort Care  Have added low dose morphine 2mg  Q4H ATC for dyspnea  Additional comfort medications per MAR  Wean O2  Unrestricted visitation  Appreciate [facility] involvement for transition to their inpatient hospice  Ongoing GOC conversations  Code Status/Advance Care Planning: DNAR/DNI  Palliative Prophylaxis:  Aspiration, Bowel Regimen, Delirium Protocol, Frequent Pain Assessment, Oral Care, Palliative Wound Care, and Turn Reposition  Additional Recommendations (Limitations, Scope, Preferences): Comfort Care  Psycho-social/Spiritual:  Desire for further Chaplaincy support: Yes  Additional Recommendations: Comfort focused care   Prognosis: Limited to days.  Discharge Planning: Discharge to inpatient hospice home.   Vitals:   01/20/24 0308 01/20/24 0630  BP:  (!) 174/87  Pulse:  63  Resp:  (!) 22  Temp: (!) 97.4 F (36.3 C)   SpO2:  92%    Intake/Output Summary (Last 24 hours) at 01/20/2024 0750 Last data filed at 01/20/2024 0036 Gross per 24 hour  Intake 1000 ml  Output --  Net 1000 ml   Gen: Elderly AA F generally uncomfortable appearing HEENT: Dry mucous membranes CV: Regular rate and rhythm PULM: On 6LPM [place],  breathing labored ABD: soft/nontender EXT: No edema Neuro: Somnolent  PPS: 10%   This conversation/these recommendations were discussed with patient primary care team, Dr. Leafy Half  Billing based on MDM: High ______________________________________________________ Lamarr Lulas [facility] Palliative Medicine Team Team Cell Phone: [phone] Please utilize secure chat with additional questions, if there is no response within 30 minutes please call the above phone number  Palliative Medicine Team providers are available by phone from 7am to 7pm daily and can be reached through the team cell phone.  Should this patient require assistance outside of these hours, please call the patient's attending physician.

## 2024-01-20 NOTE — H&P (Signed)
 History and Physical      Colleen Lowery QMV:[id number] DOB: November 01, 1941 DOA: 01/19/2024; DOS: 01/20/2024  PCP: Dortha Kern, MD  Patient coming from: home   I have personally briefly reviewed patient's old medical records in [facility]  Chief Complaint: Altered mental status  HPI: Colleen Lowery is a 83 y.o. 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  In the setting of the patient's altered mental status, the following history is provided by the patient's brother in addition to my discussions with the EDP and via chart review.  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 Serena Croissant as her outpatient oncologist.  Per review of most recent oncology note from November 2024, CT abdomen/pelvis on 08/22/2023 showed interval worsening in metastatic disease to the liver.  After identification of worsening of metastatic disease to the liver, oncology's documented plan included neoadjuvant chemotherapy that was swtiched to q3week infusions of [place], starting in November 2024.   EDP has discussed patient's case with the patient's brother, who will be at [facility] today to further discuss goals of care. While she is on home hospice and is DNR/DNI, she is not currently comfort care measures.   The patient was brought to [facility] emergency department this evening for confusion, altered mental status  relative to her baseline.  She lives at home by herself.  Family includes her brother, who lives in [place], as well as a son who lives in the state of [place].  Per chart review, most recent set of liver enzymes were checked on 09/18/2023 they were notable at that time for the following: Alkaline phosphatase 130, total bilirubin 0.7, AST 25, ALT 12.    ED Course:  Vital signs in the ED were notable for the following: Afebrile; heart rates in the 50s to 60s; systolic blood pressures in the 160s; respiratory rate 15-16, oxygen saturation 95 to 99% on room air.  Labs were notable for the following: CMP was notable for the following: Potassium 3.3, creatinine 0.91 compared to most recent prior value of 1.03 on 09/18/2023, BUN/creatinine ratio greater than 20, glucose 140, calcium adjusted for mild hypoalbuminemia noted to be 9.3.  Avidin 2.3, alkaline phosphatase 349, total bilirubin 7.7, AST 149, ALT 51.  Ammonia 41, without any prior ammonia data point available for point comparison.  CPK 84, ethanol level less than 10.  Lactic acid 1.4.  CBC notable for white blood cell count 13,500, hemoglobin 12.8.  Urinalysis notable for no white blood cells, leukocyte esterase/nitrate negative.  COVID, influenza, RSV PCR were all negative.  Per my interpretation, EKG in ED demonstrated the following: Sinus rhythm with heart rate 57, normal intervals, nonspecific T wave inversion in V5, and no evidence of ST changes, including no evidence of ST elevation.  Imaging in the ED, per corresponding formal radiology read, was notable for the following: 1 view chest x-ray showed no evidence of acute cardiopulmonary process, including no evidence of infiltrate edema, effusion, or pneumothorax.  Noncontrast  CT head showed no evidence of acute intracranial process, including no evidence of intracranial hemorrhage or acute infarct.  Right upper quadrant ultrasound showed extensive hepatic metastatic disease progressive since CT  abdomen/pelvis on 08/22/2023, as well as gallbladder sludge, some gallbladder wall thickening pericholecystic fluid, which were all felt to be reactive in the setting of extensive hepatic disease, will demonstrating no evidence of biliary duct dilation or choledocholithiasis.  Sonographic Eulah Pont sign was noted to be negative.  Additionally, CT renal stone study in comparison to CT abdomen/pelvis on 08/22/2023 showed significant interval progression of widespread hepatic metastatic disease, but demonstrated no evidence of gallbladder wall thickening or pericholecystic fluid to suggest acute cholecystitis.  Additionally, CT renal stone study showed no evidence of intra or extrahepatic biliary dilation, but did show suspected debris within the common bile duct, potentially representing choledocholithiasis.   While in the ED, the following were administered: Normal saline x 1 L bolus.  Subsequently, the patient was admitted for further evaluation and management presenting acute encephalopathy with presentation notable for radiographic evidence of interval worsening of metastatic disease to the liver acute transaminitis with additional labs notable for hypokalemia.     Review of Systems: As per HPI otherwise 10 point review of systems negative.   Past Medical History:  Diagnosis Date   AICD (automatic cardioverter/defibrillator) present    Allergy    Arthritis    FINGERS   Cancer (HCC)    left breast   Cataract    right   Crohn disease (HCC)    Depression    Diabetes mellitus without complication (HCC)    TYPE 2 , TAKING OTC MED FORGYMEMA   GERD (gastroesophageal reflux disease)    Headache    sinus   Heart failure, acute, systolic and diastolic (HCC) MARCH 2017 DX   History of colonic polyps    Hypertension    Hypothyroidism    per patient, takes OTC meds when temperature drops low   IBS (irritable bowel syndrome)    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   Seizures (HCC)    RELATED  TO TOXIC ODORS, 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   Thyroid disease    AGE 74'S UNTIL  2016 OFF ALL THRYOID MEDS NOW    Wears dentures    Wears glasses     Past Surgical History:  Procedure Laterality Date   ABDOMINAL HYSTERECTOMY     PARTIAL   BREAST BIOPSY Left 04/01/2019   INVASIVE DUCTAL CARCINOMA   BREAST SURGERY     CARDIAC CATHETERIZATION  02/10/2016   Procedure: Right/Left Heart Cath and Coronary Angiography;  Surgeon: Marykay Lex, MD;  [facility] INVASIVE CV LAB;  Service: Cardiovascular;;   CATARACT EXTRACTION W/ INTRAOCULAR LENS IMPLANT     left   COLONOSCOPY WITH PROPOFOL N/A 02/16/2017   Procedure: COLONOSCOPY WITH PROPOFOL;  Surgeon: Jeani Hawking, MD;  Location: [facility] ENDOSCOPY;  Service: Endoscopy;  Laterality: N/A;   COLONOSCOPY WITH PROPOFOL N/A 08/12/2021   Procedure: COLONOSCOPY WITH PROPOFOL;  Surgeon: Jeani Hawking, MD;  Location: [facility] ENDOSCOPY;  Service: Endoscopy;  Laterality: N/A;   EP IMPLANTABLE DEVICE N/A 02/10/2016   Procedure: ICD Implant;  Surgeon: Will Jorja Loa, MD;  Location: [facility] INVASIVE CV LAB;  Service: Cardiovascular;  Laterality: N/A;   ESOPHAGOGASTRODUODENOSCOPY (EGD) WITH PROPOFOL N/A 02/16/2017   Procedure: ESOPHAGOGASTRODUODENOSCOPY (EGD) WITH PROPOFOL;  Surgeon: Jeani Hawking, MD;  Location: [facility] ENDOSCOPY;  Service: Endoscopy;  Laterality: N/A;  reflux   IR IMAGING GUIDED PORT INSERTION  04/27/2020  IR IMAGING GUIDED PORT INSERTION  11/17/2022   MASTECTOMY Left    MASTECTOMY MODIFIED RADICAL Left 09/14/2020   Procedure: LEFT MASTECTOMY MODIFIED RADICAL;  Surgeon: Harriette Bouillon, MD;  Location: [facility] OR;  Service: General;  Laterality: Left;  PEC BLOCK   POLYPECTOMY  08/12/2021   Procedure: POLYPECTOMY;  Surgeon: Jeani Hawking, MD;  Location: [facility] ENDOSCOPY;  Service: Endoscopy;;   TONSILLECTOMY      Social History:  reports that she has been smoking cigarettes. She has a 12.5 pack-year smoking history. She has never used smokeless tobacco. She reports  that she does not drink alcohol and does not use drugs.   Allergies  Allergen Reactions   Alcohol Anaphylaxis and Swelling    Purell hand sanitizer  Swelling in the luNGS, CHEST AND THROAT and seizures Problem with smell not the Alcohol Other reaction(s): Other (See Comments) Causes seizures and swelling   Contrast Media [Iodinated Contrast Media] Anaphylaxis    Throat closed, was 40 yrs ago  *Iodine    Iodine Anaphylaxis    Throat closed, was 40 yrs ago   Atorvastatin     Myalgia    Dust Mite Extract Swelling    SWELLING IS GI SYSTEM   Latex     QUESTIONABLE INTOLERABLE MUCOUS OOZING OUT OF THROAT   Lisinopril Other (See Comments)    Pt does not remember reaction    Molds & Smuts Swelling    SWELLING IS IN GI SYSTEM   Wound Dressing Adhesive Other (See Comments)    Causes skin to tear    Family History  Problem Relation Age of Onset   Diabetes Mother    Stroke Mother    Diabetes Brother    Diabetes Maternal Grandmother    Diabetes Paternal Grandfather    Heart failure Paternal Grandfather    Hypertension Brother    Heart attack Neg Hx      Prior to Admission medications   Medication Sig Start Date End Date Taking? Authorizing Provider  alendronate (FOSAMAX) 70 MG tablet Take 1 tablet (70 mg total) by mouth once a week. Take with a full glass of water on an empty stomach. 06/25/23   Serena Croissant, MD  amiodarone (PACERONE) 400 MG tablet Take 1 tablet (400 mg total) by mouth daily. 04/12/23 04/06/24  Alver Sorrow, NP  bisacodyl (DULCOLAX) 5 MG EC tablet Take 5 mg by mouth daily as needed for moderate constipation.    [provider]  furosemide (LASIX) 20 MG tablet Take 20 mg by mouth daily.    [provider]  levETIRAcetam (KEPPRA) 500 MG tablet Take 1 tablet (500 mg total) by mouth 3 (three) times daily. 07/26/23 07/20/24  Windell Norfolk, MD  lidocaine-prilocaine (EMLA) cream Apply to affected area once Patient taking differently: Apply 1  Application topically daily as needed (For pain). 01/08/23   Serena Croissant, MD  liothyronine (CYTOMEL) 25 MCG tablet Take 25 mcg by mouth daily. 01/08/23   [provider]  metoprolol succinate (TOPROL-XL) 100 MG 24 hr tablet Take 1 tablet (100 mg total) by mouth in the morning and at bedtime. Take with or immediately following a meal. 08/31/23   Camnitz, Andree Coss, MD  potassium chloride SA (K-DUR,KLOR-CON) 20 MEQ tablet Take 20 mEq by mouth 2 (two) times daily.    [provider]  predniSONE (DELTASONE) 50 MG tablet Take 1 tablet by mouth 12 hours before scan, 1 tab 7 hours before and 1 hour before scan 08/08/23   Serena Croissant,  MD  PROLENSA 0.07 % SOLN Place 1 drop into the right eye 4 (four) times daily. 08/15/22   [provider]  Propylene Glycol (SYSTANE BALANCE) 0.6 % SOLN Place 2 drops into the right eye in the morning and at bedtime.    [provider]  sacubitril-valsartan (ENTRESTO) 97-103 MG Take 1 tablet by mouth 2 (two) times daily. 04/30/23   Alver Sorrow, NP  spironolactone (ALDACTONE) 25 MG tablet Take 0.5 tablets (12.5 mg total) by mouth daily. 11/26/23 02/24/24  Alver Sorrow, NP  TRUE METRIX BLOOD GLUCOSE TEST test strip SMARTSIG:Via Meter 09/28/22   [provider]  TRUEplus Lancets 33G MISC  09/28/22   [provider]     Objective    Physical Exam: Vitals:   01/20/24 0100 01/20/24 0110 01/20/24 0300 01/20/24 0308  BP: (!) 183/167 (!) 187/97 (!) 161/88   Pulse: 61  (!) 56   Resp: 15 11 16    Temp:    (!) 97.4 F (36.3 C)  TempSrc:    Axillary  SpO2: 95%  99%     General: appears to be stated age; confused Skin: warm, dry, no rash Head:  AT/[place] Mouth:  Oral mucosa membranes appear dry, normal dentition Neck: supple; trachea midline Heart:  RRR; did not appreciate any M/R/G Lungs: CTAB, did not appreciate any wheezes, rales, or rhonchi Abdomen: + BS; soft, ND, NT Vascular: 2+ pedal pulses b/l; 2+ radial  pulses b/l Extremities: no peripheral edema, no muscle wasting Neuro: In the setting of the patient's current mental status and associated inability to follow instructions, unable to perform full neurologic exam at this time.  As such, assessment of strength, sensation, and cranial nerves is limited at this time. Patient noted to spontaneously move all 4 extremities. No tremors.      Labs on Admission: I have personally reviewed following labs and imaging studies  CBC: Recent Labs  Lab 01/19/24 2306  WBC 13.5*  NEUTROABS 11.4*  HGB 12.8  HCT 39.1  MCV 92.4  PLT 188   Basic Metabolic Panel: Recent Labs  Lab 01/19/24 2306  NA 139  K 3.3*  CL 102  CO2 25  GLUCOSE 140*  BUN 27*  CREATININE 0.91  CALCIUM 8.2*   GFR: CrCl cannot be calculated (Unknown ideal weight.). Liver Function Tests: Recent Labs  Lab 01/19/24 2306  AST 149*  ALT 51*  ALKPHOS 349*  BILITOT 7.7*  PROT 5.8*  ALBUMIN 2.3*   No results for input(s): "LIPASE", "AMYLASE" in the last 168 hours. Recent Labs  Lab 01/19/24 2306  AMMONIA 41*   Coagulation Profile: No results for input(s): "INR", "PROTIME" in the last 168 hours. Cardiac Enzymes: Recent Labs  Lab 01/19/24 2306  CKTOTAL 84   BNP (last 3 results) No results for input(s): "PROBNP" in the last 8760 hours. HbA1C: No results for input(s): "HGBA1C" in the last 72 hours. CBG: Recent Labs  Lab 01/19/24 2200  GLUCAP 105*   Lipid Profile: No results for input(s): "CHOL", "HDL", "LDLCALC", "TRIG", "CHOLHDL", "LDLDIRECT" in the last 72 hours. Thyroid Function Tests: No results for input(s): "TSH", "T4TOTAL", "FREET4", "T3FREE", "THYROIDAB" in the last 72 hours. Anemia Panel: No results for input(s): "VITAMINB12", "FOLATE", "FERRITIN", "TIBC", "IRON", "RETICCTPCT" in the last 72 hours. Urine analysis:    Component Value Date/Time   COLORURINE AMBER (A) 01/19/2024 2146   APPEARANCEUR CLEAR 01/19/2024 2146   APPEARANCEUR Clear  06/20/2012 1207   LABSPEC 1.019 01/19/2024 2146   LABSPEC 1.012 06/20/2012  1207   PHURINE 6.0 01/19/2024 2146   GLUCOSEU NEGATIVE 01/19/2024 2146   GLUCOSEU >=500 06/20/2012 1207   HGBUR NEGATIVE 01/19/2024 2146   BILIRUBINUR SMALL (A) 01/19/2024 2146   BILIRUBINUR Negative 06/20/2012 1207   KETONESUR NEGATIVE 01/19/2024 2146   PROTEINUR 30 (A) 01/19/2024 2146   NITRITE NEGATIVE 01/19/2024 2146   LEUKOCYTESUR NEGATIVE 01/19/2024 2146   LEUKOCYTESUR Negative 06/20/2012 1207    Radiological Exams on Admission: CT Renal Stone Study Result Date: 01/20/2024 CLINICAL DATA:  Abdominal, flank pain EXAM: CT ABDOMEN AND PELVIS WITHOUT CONTRAST TECHNIQUE: Multidetector CT imaging of the abdomen and pelvis was performed following the standard protocol without IV contrast. RADIATION DOSE REDUCTION: This exam was performed according to the departmental dose-optimization program which includes automated exposure control, adjustment of the mA and/or kV according to patient size and/or use of iterative reconstruction technique. COMPARISON:  08/22/2023 FINDINGS: Lower chest: Small bilateral pleural effusions with associated bibasilar compressive atelectasis, new since prior examination. Moderate multi-vessel coronary artery calcification. Mild global cardiomegaly with left ventricular enlargement. Pacemaker leads seen within the right heart. Small hiatal hernia. Hepatobiliary: Innumerable hypoattenuating masses are seen throughout both hepatic lobes in keeping with widespread hepatic metastatic disease, significantly progressed since prior examination. High density material seen within the gallbladder lumen in keeping with sludge when correlated with concurrently performed right upper quadrant sonogram. No intra or extrahepatic biliary ductal dilation. There is probable debris noted within the common duct within the head of the pancreas, however, best seen on image # 41/2 Pancreas: Unremarkable Spleen: Unremarkable  Adrenals/Urinary Tract: Adrenal glands are unremarkable. Kidneys are normal, without renal calculi, focal lesion, or hydronephrosis. Bladder is unremarkable. Stomach/Bowel: Moderate ascites, new since prior examination. Stomach, small bowel large unremarkable. Appendix normal. No free intraperitoneal gas. Vascular/Lymphatic: Aortic atherosclerosis. No enlarged abdominal or pelvic lymph nodes. Reproductive: Status post hysterectomy. No adnexal masses. Other: Interval development of extensive diffuse body wall edema in keeping with anasarca. Musculoskeletal: No acute bone abnormality. No lytic or blastic lesion. Compression deformity T8 with sclerosis of of the vertebral body suspicious for a pathologic fracture is unchanged from prior examination. IMPRESSION: 1. Significant interval progression of widespread hepatic metastatic disease. 2. Possible choledocholithiasis. Correlation with ERCP or MRCP examination would be helpful for further evaluation. 3. Interval development of small bilateral pleural effusions, moderate ascites, and extensive diffuse body wall edema in keeping with anasarca. 4. Moderate multi-vessel coronary artery calcification. Mild global cardiomegaly with left ventricular enlargement. Aortic Atherosclerosis (ICD10-I70.0). Electronically Signed   By: Helyn Numbers M.D.   On: 01/20/2024 01:40   US Abdomen Limited RUQ (LIVER/GB) Result Date: 01/20/2024 CLINICAL DATA:  Biliary obstruction EXAM: ULTRASOUND ABDOMEN LIMITED RIGHT UPPER QUADRANT COMPARISON:  None Available. FINDINGS: Gallbladder: There is sludge seen within the gallbladder. The gallbladder is not distended. There is gallbladder wall thickening and pericholecystic fluid as well as mild ascites identified. The sonographic Eulah Pont sign is reportedly negative. Common bile duct: Diameter: 7 mm in proximal diameter Liver: There are innumerable solid hepatic masses identified throughout both hepatic lobes resulting in heterogeneity of the  parenchymal echogenicity and lobulation of the contour. This is in keeping with extensive hepatic metastatic disease and appears progressive both since prior examination of 12/26/2021 as well as CT examination of 08/22/2023. Mild perihepatic ascites appears new since prior examination. Portal vein is patent on color Doppler imaging with normal direction of blood flow towards the liver. Other: None. IMPRESSION: 1. Extensive hepatic metastatic disease, progressive both since prior examination of 12/26/2021 as well  as CT examination of 08/22/2023. 2. Gallbladder sludge. Gallbladder wall thickening and pericholecystic fluid, likely reactive in the setting of extensive hepatic disease. 3. Mild perihepatic ascites, new since prior examination. Electronically Signed   By: Helyn Numbers M.D.   On: 01/20/2024 01:27   CT HEAD WO CONTRAST Result Date: 01/19/2024 CLINICAL DATA:  Mental status change, persistent or worsening EXAM: CT HEAD WITHOUT CONTRAST TECHNIQUE: Contiguous axial images were obtained from the base of the skull through the vertex without intravenous contrast. RADIATION DOSE REDUCTION: This exam was performed according to the departmental dose-optimization program which includes automated exposure control, adjustment of the mA and/or kV according to patient size and/or use of iterative reconstruction technique. COMPARISON:  CT head May 8, 24. FINDINGS: Brain: No evidence of acute infarction, hemorrhage, hydrocephalus, extra-axial collection or mass lesion/mass effect. Partially empty sella. Vascular: No hyperdense vessel. Skull: Normal. Negative for fracture or focal lesion. Sinuses/Orbits: Clear sinuses.  No acute orbital findings. Other: No mastoid effusions. IMPRESSION: No evidence of acute intracranial abnormality. Electronically Signed   By: Feliberto Harts M.D.   On: 01/19/2024 22:59   DG Chest Port 1 View Result Date: 01/19/2024 CLINICAL DATA:  Altered mental status EXAM: PORTABLE CHEST 1 VIEW  COMPARISON:  None Available. FINDINGS: Opacity at the medial right apex relates to vascular shadow. Opacity over the right mid lung zone likely related to soft tissue and device opacification related to the chest port. The lungs are otherwise clear. No pneumothorax or pleural effusion. Cardiac size within normal limits. Right internal jugular chest port tip seen within the right atrium. Left subclavian single lead pacemaker defibrillator in place with leads overlying right ventricle. Multiple healed right rib fractures noted. No acute bone abnormality. IMPRESSION: 1. No active disease. Electronically Signed   By: Helyn Numbers M.D.   On: 01/19/2024 22:34      Assessment/Plan    Principal Problem:   Acute encephalopathy Active Problems:   Chronic systolic CHF (congestive heart failure) (HCC)   Malignant neoplasm of upper-outer quadrant of left breast in female, estrogen receptor positive (HCC)   Paroxysmal ventricular tachycardia (HCC)   Leukocytosis   Transaminitis   Hypokalemia   Acquired hypothyroidism   History of seizures     #) Acute encephalopathy: Patient brought to the ED this evening for confusion, altered mental status relative to baseline.  Specific source is unclear at this time, although differential includes potential hepatic encephalopathy in the setting of interval increase in hepatic metastatic disease.  Presenting ammonia level was 41, but without any prior ammonia data points for point comparison, noting that acute hepatic encephalopathy would represent diagnosis of exclusion.  There is no overt evidence of underlying infectious process at this time, although the possibility of choledocholithiasis is noted on CT abdomen/pelvis, without corresponding evidence of acute cholecystitis or evidence of ascending cholangitis.  Urinalysis is inconsistent with UTI, will COVID, influenza, RSV PCR all negative and chest x-ray shows no evidence of acute process, including no evidence of  infiltrate to suggest pneumonia.  She has a history of seizures, but no overt evidence of active seizures.  CT head showed no evidence of acute intracranial process, as above. Will check VBG to evaluate for any contribution from hypercapnic encephalopathy.  CPK level nonelevated.    Plan: fall precautions. Delirium precautions. Repeat CMP/CBC in the AM. Check Mg level. check TSH, B12 level. Check vbg.  Repeat ammonia level in the morning.  Further evaluation management of acute transaminitis, including pursuit of direct bilirubin  further differentiate between hepatocellular dysfunction versus process due to biliary obstruction.  I have placed palliative care consult for assistance with goals of care clarification assist with determination of not to pursue MRCP given that the patient would likely be poor vs non-surgical candidate.                   #) Acute transaminitis in the setting of interval worsening of metastatic disease to the liver: Relative to CT abdomen/pelvis in October 2024, today's right quadrant ultrasound as well as CT renal stone study show significant interval worsening of now extensive hepatic metastatic disease. Per this evening's labs, she has now developed  acute transaminitis in the interval since most recent prior liver enzymes, which were checked during the first week of November 2024, with concern regarding potential cholestatic pattern given interval increase in alkaline phosphatase as well as total bilirubin. Differential for her interval increase in total bili includes potential hepatocellular functional compromise due to extensive metastatic disease to liver versus potential biliary obstruction that maybe of either intrinsic versus extrinsic etiology, including potential contribution from extrinsic biliary compression via hepatic mets. In terms of potential intrinsic biliary obstruction, CT renal stone study showed potential debris within the common bile duct, but  without corresponding biliary dilation.  Will check direct bilirubin and GGT to further assess.  MRCP may also he helpful in distinguishing these possibilities , but would be complicated by the presence of the patient's ICD.  The patient's brother is coming to the hospital today to further discuss goals of care, and I have placed consult with the palliative care service to help facilitate these discussions in this patient who is currently hospice, DNR, DNI, but not exclusively comfort care at this time. This will assist in determining whether or not to pursue MRCP.   It is noted that CT renal stone study shows no evidence of acute cholecystitis, and clinically, presentation does not appear consistent with ascending cholangitis at this time .  She has mild leukocytosis, which may will be inflammatory versus as a consequence of hemoconcentration stemming from presenting intravascular dehydration.  In the absence of objective fever or any additional SIRS criteria, criteria for sepsis are not currently met, and she appears hemodynamically stable, with systolic blood pressures in the 160s mmHg. Holding off on iv abx for now.   Plan: Check direct bili, GGT.  Repeat CMP, CBC in the morning.  Check PTT, INR to further evaluate hepatic synthetic function.  Palliative care consult for assistance with goals of care clarification, as above.  Refraining from MRCP study pending discussions regarding goals of care. Peggye Form added her outpatient oncologist, Dr. Serena Croissant, to the treatment team starting at 7 AM on 3/9.                          #) Hypokalemia: presenting potassium level noted to be 3.3.    Plan: monitor on tele. KCl  40 meq  iv over 4 hours. Add-on serum mag level. CMP, mag level in the AM.                      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 Pamelia Hoit as her outpatient oncologist.  Per her most recent pathology documentation  from November 2024, the time was to continue neoadjuvant chemotherapy, to switch to Enherth infusions on q 3 week basis.  This evening's imaging and labs demonstrate significant  interval progression and metastatic disease to the liver, as above.   Plan: I have Placed palliative care consult for assistance with goals of care clarification. Peggye Form added her outpatient oncologist, Dr. Serena Croissant, to the treatment team starting at 7 AM on 3/9.                     #) Type 2 Diabetes Mellitus: documented history of such.  Appears managed via lifestyle modifications in the absence of any outpatient use of insulin or oral hypoglycemic agents.  Most recent hemoglobin A1c was noted to be 7.3% when checked in May 2024.  Presenting blood sugar this evening is 140.  Likely with heat and risk for ensuing development of hypoglycemia now given significant interval increase in metastatic disease to the liver.  Plan: accuchecks QAC and HS with very low dose SSI.  Add on hemoglobin A1c level.                        #) Chronic systolic heart failure: documented history of such, with most recent echocardiogram performed in May 2024, which was notable for LVEF 35 to 40%, indeterminate diastolic parameters, mildly reduced right ventricular systolic function. No clinical evidence to suggest acutely decompensated heart failure at this time including chest x-ray shows no evidence of acute cardiopulmonary process. home diuretic regimen reportedly consists of the following: Lasix, spironolactone.  Other, she appears clinically intravascularly depleted, and has received a 1 L NS bolus in the ED this evening.  home cardiac medications also include the following: Spironolactone, Entresto, metoprolol succinate, Lasix.   Plan: monitor strict I's & O's and daily weights. Repeat CMP in AM. Check serum mag level.  Pending goals of care clarification the palliative care consult later today, we will  continue home Entresto and metoprolol succinate for now .  Will hold next doses of spironolactone and Lasix for now with plan to reassess volume status in the morning.                    #) acquired hypothyroidism: documented h/o such, on Cytomel as outpatient.   Plan: cont home Cytomel.  Add on TSH in the setting of presenting altered mental status.                       #) History of seizures: Documented history of such, without clinical evidence to suggest active seizures at this time.  Outpatient antiepileptic regimen consists of: Keppra 500 mg p.o. 3 times daily.  Of note, in this patient with increased risk for metastatic disease to the brain, presenting CT head showed no evidence of acute intracranial process.   Plan: Continue outpatient antiepileptic regimen, but will transition to IV route of administration for now.                     #) History of paroxysmal ventricular tachycardia: Documented history of such, which prompted placement of ICD in 2017 for secondary prevention of paroxysmal ventricular tachycardia.  Additionally, she is on amiodarone for secondary prevention as well.  Appears to be in sinus rhythm at this time.  Of note, potassium level is low at 3.3.  The presence of the patient's ICD is notable given potential considerations for subsequently pursuing MRCP, as above.  Plan: Continue amiodarone for now, but repeat CMP in the morning, with close monitoring of interval trend in liver enzymes.  Check serum magnesium level.  Further evaluation management of presenting hypokalemia, as above.  Monitor on telemetry.  Continue outpatient metoprolol succinate.      DVT prophylaxis: SCD's   Code Status: DNR/DNI, as confirmed by patient's brother and consistent with active MOST form and active golden ticket on file.  Family Communication: EDP d/w patient's brother Enduron, as further detailed above Disposition Plan: Per  Rounding Team Consults called:  I've placed palliative care consult for assistance with goals of care. I've added her outpatient oncologist, Dr. Serena Croissant, to the treatment team starting at 7 AM on 3/9.;  Admission status: Inpatient    I SPENT GREATER THAN 75  MINUTES IN CLINICAL CARE TIME/MEDICAL DECISION-MAKING IN COMPLETING THIS ADMISSION.     Chaney Born Fulton Merry DO [facility] From 7PM - 7AM   01/20/2024, 4:31 AM

## 2024-01-20 NOTE — ED Notes (Signed)
 Writer went to give amiodarone and the system flagged the medication. Writer contacted pharmacy to double check if it was acceptable to give medication and pharmacist advised to proceed and give the amiodarone. Pharmacy noted that pt has been taking this medication before hospital admission as well.

## 2024-01-20 NOTE — ED Notes (Signed)
 Report given to [facility]

## 2024-01-20 NOTE — Care Management (Addendum)
 Patient has been followed by [facility] for hospice/palliative care. Patient seen by palliative, spoke with brother, son is flying in, patient deemed appropriate for residential IP hospice. Comfort care at this juncture [address] [facility] " Patient has been approved to go to [facility] in [place]. I have arranged for [facility] to pick her up around 5pm. Please call me directly at [phone] if you have any questions as I do not monitor chat continuously. Please call [phone] to report to [facility]. "

## 2024-01-20 NOTE — ED Notes (Signed)
 This RN in contact with Colleen Lowery, [employer], regarding the status of pt and plan of care. Colleen Lowery provided an after hours number to contact with pt updates which is [phone].

## 2024-01-20 NOTE — ED Notes (Signed)
 Pt's oxygen saturation not reading well. Respiratory came up and examined pt to ensure pt's oxygen saturation level was acceptable and within normal limits. Oxygen saturation is low 90s. Arterial blood gas taken as well.

## 2024-02-05 NOTE — Discharge Summary (Signed)
 Physician Discharge Summary   Patient: Colleen Lowery MRN: [id number] DOB: October 23, 1941  Admit date:     01/19/2024  Discharge date: 02/05/24  Discharge Physician: Marinda Elk   PCP: Patient, No Pcp Per    Discharge Diagnoses: Principal Problem:   Acute encephalopathy Active Problems:   Chronic systolic CHF (congestive heart failure) (HCC)   Malignant neoplasm of upper-outer quadrant of left breast in female, estrogen receptor positive (HCC)   Paroxysmal ventricular tachycardia (HCC)   Leukocytosis   Transaminitis   Hypokalemia   Acquired hypothyroidism   History of seizures  Resolved Problems:   * No resolved hospital problems. *  Hospital Course: 83 year old female with past medical history of coronary artery disease with occluded RCA, systolic and diastolic congestive heart failure (Echo 03/2023 EF 35-40%, G2DD on 2017 Echo), status post Medtronic single-chamber ICD placement (follows with Dr. 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  Upon evaluation in the emergency department patient underwent CT imaging of the abdomen and pelvis which revealed significant interval \\progression  of widespread Metastatic disease and possible choledocholithiasis.  The hospitalist group was called to assess the patient for admission the hospital for encephalopathy workup.  Of note, palliative care consultation was placed for further discussion surrounding patient's goals of care.    With assistance of palliative care, lengthy discussions were undertaken with the patient's son Catha Nottingham surrounding the patient's rapidly worsening prognosis.  The son decided that considering the patient's progressive decline it would be in the best  patient's best interest to focus on comfort measures at this time.  Arrangements were made for the patient to be discharged to an inpatient hospice bed with [facility] [place] in stable condition on 01/20/2024.      Assessment and Plan: No notes have been filed under this hospital service. Service: Hospitalist       Pain control - [employer] Controlled Substance Reporting System database was reviewed. and patient was instructed, not to drive, operate heavy machinery, perform activities at heights, swimming or participation in water activities or provide baby-sitting services while on Pain, Sleep and Anxiety Medications; until their outpatient Physician has advised to do so again. Also recommended to not to take more than prescribed Pain, Sleep and Anxiety Medications.  Consultants: Palliative Care Procedures performed: None  Disposition: Hospice care Diet recommendation:  Discharge Diet Orders (From admission, onward)     Start     Ordered   01/20/24 0000  Diet general       Comments: Comfort feeds   01/20/24 1544           Comfort Feeds  DISCHARGE MEDICATION: Allergies as of 01/20/2024       Reactions   Alcohol Anaphylaxis, Swelling, Other (See Comments)   Purell hand sanitizer "Allergic," per Hospice Swelling in the LUNGS, CHEST AND THROAT and seizures Problem with smell not the Alcohol Causes seizures and swelling   Contrast Media [iodinated Contrast Media] Anaphylaxis, Other (See Comments)   Throat closed, was 40 yrs ago = IODINE  "Allergic," per Hospice   Iodine Anaphylaxis, Other (See Comments)   Throat closed, was 40 yrs ago "Allergic," per Hospice   Atorvastatin    Myalgia    Dust Mite Extract Swelling, Other (See Comments)   SWELLING IS GI SYSTEM   Latex  Other (See Comments)   QUESTIONABLE INTOLERABLE MUCOUS OOZING OUT OF THROAT "Allergic," per Hospice   Lisinopril Other (See Comments)   "Allergic," per Hospice   Molds & Smuts Swelling   SWELLING  IS IN GI SYSTEM   Wound Dressing Adhesive Other (See Comments)   Causes skin to tear "Allergic," per Hospice        Medication List     STOP taking these medications    alendronate 70 MG tablet Commonly known as: FOSAMAX   predniSONE 50 MG tablet Commonly known as: DELTASONE       TAKE these medications    acetaminophen 325 MG tablet Commonly known as: TYLENOL Take 2 tablets (650 mg total) by mouth every 6 (six) hours as needed for mild pain (pain score 1-3) (or Fever >/= 101).   acetaminophen 650 MG suppository Commonly known as: TYLENOL Place 1 suppository (650 mg total) rectally every 6 (six) hours as needed for mild pain (pain score 1-3) (or Fever >/= 101).   amiodarone 400 MG tablet Commonly known as: PACERONE Take 1 tablet (400 mg total) by mouth daily.   antiseptic oral rinse Liqd Apply 15 mLs topically as needed for dry mouth.   glycopyrrolate 1 MG tablet Commonly known as: ROBINUL Take 1 tablet (1 mg total) by mouth every 4 (four) hours as needed (excessive secretions).   glycopyrrolate 0.2 MG/ML injection Commonly known as: ROBINUL Inject 1 mL (0.2 mg total) into the skin every 4 (four) hours as needed (excessive secretions).   glycopyrrolate 0.2 MG/ML injection Commonly known as: ROBINUL Inject 1 mL (0.2 mg total) into the vein every 4 (four) hours as needed (excessive secretions).   levETIRAcetam 500 MG/100ML Soln Commonly known as: KEPRRA Inject 100 mLs (500 mg total) into the vein 3 (three) times daily.   lidocaine-prilocaine cream Commonly known as: EMLA Apply to affected area once What changed:  how much to take how to take this when to take this reasons to take this additional instructions   metoprolol succinate 100 MG 24 hr tablet Commonly known as: TOPROL-XL Take 1 tablet (100 mg total) by mouth in the morning and at bedtime. Take with or immediately following a meal. What changed: when to take this       ASK your doctor about  these medications    AMINO ACID PO Take 1 capsule by mouth 4 (four) times daily.   B COMPLEX VITAMINS ER PO Take 1 tablet by mouth daily.   bisacodyl 5 MG EC tablet Commonly known as: DULCOLAX Take 5 mg by mouth daily as needed (for constipation). Ask about: Which instructions should I use?   Brainstrong Memory Support Tabs Take 1 tablet by mouth daily.   Entresto 97-103 MG Generic drug: sacubitril-valsartan Take 1 tablet by mouth 2 (two) times daily. Ask about: Which instructions should I use?   fexofenadine 180 MG tablet Commonly known as: ALLEGRA Take 180 mg by mouth daily.   furosemide 20 MG tablet Commonly known as: LASIX Take 20 mg by mouth daily as needed (for abdominal or lower extremity swelling). Ask about: Which instructions should I use?   Keppra 500 MG tablet Generic drug: levETIRAcetam Take 500 mg by mouth 3 (three) times daily. Ask about: Which instructions should I use?   liothyronine 25 MCG tablet Commonly known as: CYTOMEL Take 25 mcg by mouth daily. Ask about: Which instructions should I use?   LORazepam 1 MG tablet Commonly known as: ATIVAN Take 1 mg by mouth See admin  instructions. Take 1 mg by mouth every 8 hours as needed for severe anxiety or shortness of breath not relieved by morphine OR 1 mg every 15 minutes for 3 doses as needed for an active seizure Ask about: Which instructions should I use?   LORazepam 2 MG/ML injection Commonly known as: ATIVAN Inject 0.25-0.5 mLs (0.5-1 mg total) into the vein every hour as needed for anxiety, seizure or sedation. Ask about: Which instructions should I use?   morphine 15 MG tablet Commonly known as: MSIR Take 7.5 mg by mouth every 4 (four) hours as needed (for pain or shortness of breath). Ask about: Which instructions should I use?   morphine 15 MG tablet Commonly known as: MSIR SMARTSIG:0.5 Tablet(s) By Mouth Every 4 Hours PRN Replaced by: morphine (PF) 2 MG/ML injection Ask about: Which  instructions should I use?   morphine (PF) 2 MG/ML injection Inject 1 mL (2 mg total) into the vein every 4 (four) hours. Replaces: morphine 15 MG tablet Ask about: Which instructions should I use?   morphine (PF) 2 MG/ML injection Inject 1 mL (2 mg total) into the vein every hour as needed (Pain/Dyspnea). Ask about: Which instructions should I use?   ondansetron 4 MG tablet Commonly known as: ZOFRAN Take 4 mg by mouth every 4 (four) hours as needed for nausea. Ask about: Which instructions should I use?   ondansetron 4 MG/2ML Soln injection Commonly known as: ZOFRAN Inject 2 mLs (4 mg total) into the vein every 6 (six) hours as needed for nausea or vomiting. Ask about: Which instructions should I use?   polyethylene glycol powder 17 GM/SCOOP powder Commonly known as: GLYCOLAX/MIRALAX Take 17 g by mouth daily.   potassium chloride SA 20 MEQ tablet Commonly known as: KLOR-CON M Take 20 mEq by mouth daily. Ask about: Which instructions should I use?   senna 8.6 MG Tabs tablet Commonly known as: SENOKOT Take 1 tablet by mouth every 12 (twelve) hours as needed (if no bowel movement after 3 days). Ask about: Which instructions should I use?   simethicone 125 MG chewable tablet Commonly known as: MYLICON Chew 125 mg by mouth every 8 (eight) hours as needed for flatulence.   spironolactone 25 MG tablet Commonly known as: ALDACTONE Take 12.5 mg by mouth daily. Ask about: Which instructions should I use?   SUPER ENZYMES PO Take 1 capsule by mouth daily.        Follow-up Information     [facility] Go to.   Contact information: [place]               Discharge Exam:  GENERAL:  Severe lethargy, CONFUSION SKIN:  Poor skin turgor CV: S1, S2, RRR LUNGS: Bibasilar rales UXN:ATFT, nontender   Condition at discharge: fair  The results of significant diagnostics from this hospitalization (including imaging, microbiology, ancillary and laboratory) are  listed below for reference.   Imaging Studies: CT Renal Stone Study Result Date: 01/20/2024 CLINICAL DATA:  Abdominal, flank pain EXAM: CT ABDOMEN AND PELVIS WITHOUT CONTRAST TECHNIQUE: Multidetector CT imaging of the abdomen and pelvis was performed following the standard protocol without IV contrast. RADIATION DOSE REDUCTION: This exam was performed according to the departmental dose-optimization program which includes automated exposure control, adjustment of the mA and/or kV according to patient size and/or use of iterative reconstruction technique. COMPARISON:  08/22/2023 FINDINGS: Lower chest: Small bilateral pleural effusions with associated bibasilar compressive atelectasis, new since prior examination. Moderate multi-vessel coronary artery calcification. Mild global cardiomegaly  with left ventricular enlargement. Pacemaker leads seen within the right heart. Small hiatal hernia. Hepatobiliary: Innumerable hypoattenuating masses are seen throughout both hepatic lobes in keeping with widespread hepatic metastatic disease, significantly progressed since prior examination. High density material seen within the gallbladder lumen in keeping with sludge when correlated with concurrently performed right upper quadrant sonogram. No intra or extrahepatic biliary ductal dilation. There is probable debris noted within the common duct within the head of the pancreas, however, best seen on image # 41/2 Pancreas: Unremarkable Spleen: Unremarkable Adrenals/Urinary Tract: Adrenal glands are unremarkable. Kidneys are normal, without renal calculi, focal lesion, or hydronephrosis. Bladder is unremarkable. Stomach/Bowel: Moderate ascites, new since prior examination. Stomach, small bowel large unremarkable. Appendix normal. No free intraperitoneal gas. Vascular/Lymphatic: Aortic atherosclerosis. No enlarged abdominal or pelvic lymph nodes. Reproductive: Status post hysterectomy. No adnexal masses. Other: Interval development  of extensive diffuse body wall edema in keeping with anasarca. Musculoskeletal: No acute bone abnormality. No lytic or blastic lesion. Compression deformity T8 with sclerosis of of the vertebral body suspicious for a pathologic fracture is unchanged from prior examination. IMPRESSION: 1. Significant interval progression of widespread hepatic metastatic disease. 2. Possible choledocholithiasis. Correlation with ERCP or MRCP examination would be helpful for further evaluation. 3. Interval development of small bilateral pleural effusions, moderate ascites, and extensive diffuse body wall edema in keeping with anasarca. 4. Moderate multi-vessel coronary artery calcification. Mild global cardiomegaly with left ventricular enlargement. Aortic Atherosclerosis (ICD10-I70.0). Electronically Signed   By: Helyn Numbers M.D.   On: 01/20/2024 01:40   US Abdomen Limited RUQ (LIVER/GB) Result Date: 01/20/2024 CLINICAL DATA:  Biliary obstruction EXAM: ULTRASOUND ABDOMEN LIMITED RIGHT UPPER QUADRANT COMPARISON:  None Available. FINDINGS: Gallbladder: There is sludge seen within the gallbladder. The gallbladder is not distended. There is gallbladder wall thickening and pericholecystic fluid as well as mild ascites identified. The sonographic Eulah Pont sign is reportedly negative. Common bile duct: Diameter: 7 mm in proximal diameter Liver: There are innumerable solid hepatic masses identified throughout both hepatic lobes resulting in heterogeneity of the parenchymal echogenicity and lobulation of the contour. This is in keeping with extensive hepatic metastatic disease and appears progressive both since prior examination of 12/26/2021 as well as CT examination of 08/22/2023. Mild perihepatic ascites appears new since prior examination. Portal vein is patent on color Doppler imaging with normal direction of blood flow towards the liver. Other: None. IMPRESSION: 1. Extensive hepatic metastatic disease, progressive both since prior  examination of 12/26/2021 as well as CT examination of 08/22/2023. 2. Gallbladder sludge. Gallbladder wall thickening and pericholecystic fluid, likely reactive in the setting of extensive hepatic disease. 3. Mild perihepatic ascites, new since prior examination. Electronically Signed   By: Helyn Numbers M.D.   On: 01/20/2024 01:27   CT HEAD WO CONTRAST Result Date: 01/19/2024 CLINICAL DATA:  Mental status change, persistent or worsening EXAM: CT HEAD WITHOUT CONTRAST TECHNIQUE: Contiguous axial images were obtained from the base of the skull through the vertex without intravenous contrast. RADIATION DOSE REDUCTION: This exam was performed according to the departmental dose-optimization program which includes automated exposure control, adjustment of the mA and/or kV according to patient size and/or use of iterative reconstruction technique. COMPARISON:  CT head May 8, 24. FINDINGS: Brain: No evidence of acute infarction, hemorrhage, hydrocephalus, extra-axial collection or mass lesion/mass effect. Partially empty sella. Vascular: No hyperdense vessel. Skull: Normal. Negative for fracture or focal lesion. Sinuses/Orbits: Clear sinuses.  No acute orbital findings. Other: No mastoid effusions. IMPRESSION: No evidence of acute intracranial  abnormality. Electronically Signed   By: Feliberto Harts M.D.   On: 01/19/2024 22:59   DG Chest Port 1 View Result Date: 01/19/2024 CLINICAL DATA:  Altered mental status EXAM: PORTABLE CHEST 1 VIEW COMPARISON:  None Available. FINDINGS: Opacity at the medial right apex relates to vascular shadow. Opacity over the right mid lung zone likely related to soft tissue and device opacification related to the chest port. The lungs are otherwise clear. No pneumothorax or pleural effusion. Cardiac size within normal limits. Right internal jugular chest port tip seen within the right atrium. Left subclavian single lead pacemaker defibrillator in place with leads overlying right  ventricle. Multiple healed right rib fractures noted. No acute bone abnormality. IMPRESSION: 1. No active disease. Electronically Signed   By: Helyn Numbers M.D.   On: 01/19/2024 22:34    Microbiology: Results for orders placed or performed during the hospital encounter of 01/19/24  Resp panel by RT-PCR (RSV, Flu A&B, Covid) Anterior Nasal Swab     Status: None   Collection Time: 01/19/24  9:47 PM   Specimen: Anterior Nasal Swab  Result Value Ref Range Status   SARS Coronavirus 2 by RT PCR NEGATIVE NEGATIVE Final    Comment: (NOTE) SARS-CoV-2 target nucleic acids are NOT DETECTED.  The SARS-CoV-2 RNA is generally detectable in upper respiratory specimens during the acute phase of infection. The lowest concentration of SARS-CoV-2 viral copies this assay can detect is 138 copies/mL. A negative result does not preclude SARS-Cov-2 infection and should not be used as the sole basis for treatment or other patient management decisions. A negative result may occur with  improper specimen collection/handling, submission of specimen other than nasopharyngeal swab, presence of viral mutation(s) within the areas targeted by this assay, and inadequate number of viral copies(<138 copies/mL). A negative result must be combined with clinical observations, patient history, and epidemiological information. The expected result is Negative.  Fact Sheet for Patients:  BloggerCourse.com  Fact Sheet for Healthcare Providers:  SeriousBroker.it  This test is no t yet approved or cleared by the [place] FDA and  has been authorized for detection and/or diagnosis of SARS-CoV-2 by FDA under an Emergency Use Authorization (EUA). This EUA will remain  in effect (meaning this test can be used) for the duration of the COVID-19 declaration under Section 564(b)(1) of the Act, 21 U.S.C.section 360bbb-3(b)(1), unless the authorization is terminated  or revoked  sooner.       Influenza A by PCR NEGATIVE NEGATIVE Final   Influenza B by PCR NEGATIVE NEGATIVE Final    Comment: (NOTE) The Xpert Xpress SARS-CoV-2/FLU/RSV plus assay is intended as an aid in the diagnosis of influenza from Nasopharyngeal swab specimens and should not be used as a sole basis for treatment. Nasal washings and aspirates are unacceptable for Xpert Xpress SARS-CoV-2/FLU/RSV testing.  Fact Sheet for Patients: BloggerCourse.com  Fact Sheet for Healthcare Providers: SeriousBroker.it  This test is not yet approved or cleared by the [place] FDA and has been authorized for detection and/or diagnosis of SARS-CoV-2 by FDA under an Emergency Use Authorization (EUA). This EUA will remain in effect (meaning this test can be used) for the duration of the COVID-19 declaration under Section 564(b)(1) of the Act, 21 U.S.C. section 360bbb-3(b)(1), unless the authorization is terminated or revoked.     Resp Syncytial Virus by PCR NEGATIVE NEGATIVE Final    Comment: (NOTE) Fact Sheet for Patients: BloggerCourse.com  Fact Sheet for Healthcare Providers: SeriousBroker.it  This test is not yet approved  or cleared by the [place] and has been authorized for detection and/or diagnosis of SARS-CoV-2 by FDA under an Emergency Use Authorization (EUA). This EUA will remain in effect (meaning this test can be used) for the duration of the COVID-19 declaration under Section 564(b)(1) of the Act, 21 U.S.C. section 360bbb-3(b)(1), unless the authorization is terminated or revoked.  Performed at [facility], [address], [place], [place] [address]     Labs: CBC: No results for input(s): "WBC", "NEUTROABS", "HGB", "HCT", "MCV", "PLT" in the last 168 hours. Basic Metabolic Panel: No results for input(s): "NA", "K", "CL", "CO2", "GLUCOSE", "BUN",  "CREATININE", "CALCIUM", "MG", "PHOS" in the last 168 hours. Liver Function Tests: No results for input(s): "AST", "ALT", "ALKPHOS", "BILITOT", "PROT", "ALBUMIN" in the last 168 hours. CBG: No results for input(s): "GLUCAP" in the last 168 hours.  Discharge time spent: greater than 30 minutes.  Signed: Marinda Elk, MD [facility] 02/05/2024

## 2024-02-12 DEATH — deceased

## 2024-02-18 ENCOUNTER — Encounter: Payer: Self-pay | Admitting: Hematology and Oncology

## 2024-02-22 ENCOUNTER — Encounter: Payer: Self-pay | Admitting: Hematology and Oncology

## 2024-02-25 ENCOUNTER — Ambulatory Visit (HOSPITAL_BASED_OUTPATIENT_CLINIC_OR_DEPARTMENT_OTHER): Payer: Medicare HMO | Admitting: Family

## 2024-02-25 ENCOUNTER — Encounter: Payer: Self-pay | Admitting: Hematology and Oncology

## 2024-07-31 ENCOUNTER — Ambulatory Visit: Payer: Medicare HMO | Admitting: Neurology
